# Patient Record
Sex: Male | Born: 1945 | Race: Black or African American | Hispanic: No | Marital: Married | Smoking: Former smoker
Health system: Southern US, Community
[De-identification: ages and names within clinical notes are randomized; demographics above are authoritative.]

## PROBLEM LIST (undated history)

## (undated) DIAGNOSIS — E119 Type 2 diabetes mellitus without complications: Secondary | ICD-10-CM

## (undated) DIAGNOSIS — Z86010 Personal history of colonic polyps: Secondary | ICD-10-CM

## (undated) DIAGNOSIS — D126 Benign neoplasm of colon, unspecified: Secondary | ICD-10-CM

## (undated) DIAGNOSIS — G4700 Insomnia, unspecified: Secondary | ICD-10-CM

## (undated) DIAGNOSIS — K611 Rectal abscess: Secondary | ICD-10-CM

## (undated) DIAGNOSIS — R7303 Prediabetes: Secondary | ICD-10-CM

## (undated) DIAGNOSIS — K649 Unspecified hemorrhoids: Secondary | ICD-10-CM

## (undated) DIAGNOSIS — I629 Nontraumatic intracranial hemorrhage, unspecified: Secondary | ICD-10-CM

## (undated) DIAGNOSIS — I639 Cerebral infarction, unspecified: Secondary | ICD-10-CM

## (undated) DIAGNOSIS — I10 Essential (primary) hypertension: Secondary | ICD-10-CM

## (undated) DIAGNOSIS — F528 Other sexual dysfunction not due to a substance or known physiological condition: Secondary | ICD-10-CM

## (undated) DIAGNOSIS — C679 Malignant neoplasm of bladder, unspecified: Secondary | ICD-10-CM

## (undated) DIAGNOSIS — H269 Unspecified cataract: Secondary | ICD-10-CM

## (undated) DIAGNOSIS — E785 Hyperlipidemia, unspecified: Secondary | ICD-10-CM

## (undated) HISTORY — PX: POLYPECTOMY: SHX149

## (undated) HISTORY — DX: Rectal abscess: K61.1

## (undated) HISTORY — DX: Cerebral infarction, unspecified: I63.9

## (undated) HISTORY — DX: Insomnia, unspecified: G47.00

## (undated) HISTORY — DX: Essential (primary) hypertension: I10

## (undated) HISTORY — DX: Other sexual dysfunction not due to a substance or known physiological condition: F52.8

## (undated) HISTORY — DX: Personal history of colonic polyps: Z86.010

## (undated) HISTORY — DX: Benign neoplasm of colon, unspecified: D12.6

## (undated) HISTORY — PX: CATARACT EXTRACTION W/ INTRAOCULAR LENS  IMPLANT, BILATERAL: SHX1307

## (undated) HISTORY — DX: Hyperlipidemia, unspecified: E78.5

## (undated) HISTORY — DX: Unspecified cataract: H26.9

## (undated) HISTORY — DX: Type 2 diabetes mellitus without complications: E11.9

## (undated) HISTORY — DX: Unspecified hemorrhoids: K64.9

## (undated) HISTORY — PX: COLONOSCOPY: SHX174

## (undated) MED FILL — Dexamethasone Sodium Phosphate Inj 100 MG/10ML: INTRAMUSCULAR | Qty: 1 | Status: AC

## (undated) MED FILL — Fosaprepitant Dimeglumine For IV Infusion 150 MG (Base Eq): INTRAVENOUS | Qty: 5 | Status: AC

---

## 1998-07-08 ENCOUNTER — Ambulatory Visit (HOSPITAL_COMMUNITY): Admission: RE | Admit: 1998-07-08 | Discharge: 1998-07-08 | Payer: Self-pay | Admitting: Internal Medicine

## 2004-06-17 ENCOUNTER — Ambulatory Visit: Payer: Self-pay | Admitting: Internal Medicine

## 2004-06-18 ENCOUNTER — Ambulatory Visit: Payer: Self-pay | Admitting: Internal Medicine

## 2004-07-08 ENCOUNTER — Ambulatory Visit: Payer: Self-pay | Admitting: Internal Medicine

## 2004-07-16 ENCOUNTER — Ambulatory Visit: Payer: Self-pay | Admitting: Internal Medicine

## 2006-03-04 ENCOUNTER — Ambulatory Visit: Payer: Self-pay | Admitting: Internal Medicine

## 2006-03-08 ENCOUNTER — Ambulatory Visit: Payer: Self-pay | Admitting: Internal Medicine

## 2006-03-10 ENCOUNTER — Ambulatory Visit: Payer: Self-pay

## 2006-04-06 ENCOUNTER — Ambulatory Visit: Payer: Self-pay

## 2006-04-06 ENCOUNTER — Encounter: Payer: Self-pay | Admitting: Internal Medicine

## 2006-04-06 HISTORY — PX: TRANSTHORACIC ECHOCARDIOGRAM: SHX275

## 2007-08-22 ENCOUNTER — Telehealth: Payer: Self-pay | Admitting: Internal Medicine

## 2007-11-15 ENCOUNTER — Ambulatory Visit: Payer: Self-pay | Admitting: Internal Medicine

## 2007-11-15 LAB — CONVERTED CEMR LAB
Alkaline Phosphatase: 96 units/L (ref 39–117)
Basophils Absolute: 0 10*3/uL (ref 0.0–0.1)
Bilirubin Urine: NEGATIVE
Bilirubin, Direct: 0.1 mg/dL (ref 0.0–0.3)
Calcium: 9.8 mg/dL (ref 8.4–10.5)
Eosinophils Absolute: 0.2 10*3/uL (ref 0.0–0.7)
GFR calc Af Amer: 79 mL/min
GFR calc non Af Amer: 65 mL/min
HCT: 49 % (ref 39.0–52.0)
HDL: 34.7 mg/dL — ABNORMAL LOW (ref >39.0)
Hemoglobin, Urine: NEGATIVE
Ketones, ur: NEGATIVE mg/dL
MCHC: 33.6 g/dL (ref 30.0–36.0)
MCV: 91.8 fL (ref 78.0–100.0)
Monocytes Absolute: 0.5 10*3/uL (ref 0.1–1.0)
Nitrite: NEGATIVE
PSA: 1.28 ng/mL (ref 0.10–4.00)
Platelets: 264 10*3/uL (ref 150–400)
Potassium: 3.2 meq/L — ABNORMAL LOW (ref 3.5–5.1)
RDW: 13.1 % (ref 11.5–14.6)
Sodium: 142 meq/L (ref 135–145)
TSH: 1.17 microintl units/mL (ref 0.35–5.50)
Triglycerides: 160 mg/dL — ABNORMAL HIGH (ref 0–149)

## 2007-11-16 ENCOUNTER — Encounter: Payer: Self-pay | Admitting: *Deleted

## 2007-11-16 DIAGNOSIS — I10 Essential (primary) hypertension: Secondary | ICD-10-CM | POA: Insufficient documentation

## 2007-11-16 DIAGNOSIS — D126 Benign neoplasm of colon, unspecified: Secondary | ICD-10-CM

## 2007-11-16 DIAGNOSIS — G4700 Insomnia, unspecified: Secondary | ICD-10-CM

## 2007-11-16 DIAGNOSIS — K649 Unspecified hemorrhoids: Secondary | ICD-10-CM | POA: Insufficient documentation

## 2007-11-16 DIAGNOSIS — F528 Other sexual dysfunction not due to a substance or known physiological condition: Secondary | ICD-10-CM | POA: Insufficient documentation

## 2007-11-16 DIAGNOSIS — Z862 Personal history of diseases of the blood and blood-forming organs and certain disorders involving the immune mechanism: Secondary | ICD-10-CM

## 2007-11-16 DIAGNOSIS — J301 Allergic rhinitis due to pollen: Secondary | ICD-10-CM

## 2007-11-16 DIAGNOSIS — Z8639 Personal history of other endocrine, nutritional and metabolic disease: Secondary | ICD-10-CM

## 2007-11-17 ENCOUNTER — Ambulatory Visit: Payer: Self-pay | Admitting: Internal Medicine

## 2007-11-17 DIAGNOSIS — E785 Hyperlipidemia, unspecified: Secondary | ICD-10-CM | POA: Insufficient documentation

## 2007-11-17 DIAGNOSIS — E1169 Type 2 diabetes mellitus with other specified complication: Secondary | ICD-10-CM

## 2007-11-17 DIAGNOSIS — E119 Type 2 diabetes mellitus without complications: Secondary | ICD-10-CM | POA: Insufficient documentation

## 2007-12-07 ENCOUNTER — Ambulatory Visit: Payer: Self-pay | Admitting: Internal Medicine

## 2008-01-11 ENCOUNTER — Encounter: Payer: Self-pay | Admitting: Internal Medicine

## 2008-01-11 ENCOUNTER — Ambulatory Visit: Payer: Self-pay | Admitting: Internal Medicine

## 2008-01-15 ENCOUNTER — Ambulatory Visit: Payer: Self-pay | Admitting: Internal Medicine

## 2008-01-15 ENCOUNTER — Encounter: Payer: Self-pay | Admitting: Internal Medicine

## 2008-01-18 ENCOUNTER — Ambulatory Visit: Payer: Self-pay | Admitting: Internal Medicine

## 2008-01-21 ENCOUNTER — Encounter: Payer: Self-pay | Admitting: Internal Medicine

## 2008-01-26 ENCOUNTER — Ambulatory Visit: Payer: Self-pay | Admitting: Internal Medicine

## 2008-01-26 ENCOUNTER — Encounter: Payer: Self-pay | Admitting: Internal Medicine

## 2008-06-13 ENCOUNTER — Telehealth: Payer: Self-pay | Admitting: Internal Medicine

## 2009-06-18 ENCOUNTER — Encounter (INDEPENDENT_AMBULATORY_CARE_PROVIDER_SITE_OTHER): Payer: Self-pay | Admitting: *Deleted

## 2009-06-23 ENCOUNTER — Ambulatory Visit: Payer: Self-pay | Admitting: Internal Medicine

## 2009-06-23 LAB — CONVERTED CEMR LAB
ALT: 27 units/L (ref 0–53)
Albumin: 3.8 g/dL (ref 3.5–5.2)
Alkaline Phosphatase: 95 units/L (ref 39–117)
Basophils Relative: 0.2 % (ref 0.0–3.0)
Bilirubin Urine: NEGATIVE
Bilirubin, Direct: 0.1 mg/dL (ref 0.0–0.3)
CO2: 29 meq/L (ref 19–32)
Calcium: 9.5 mg/dL (ref 8.4–10.5)
Chloride: 105 meq/L (ref 96–112)
Direct LDL: 160 mg/dL
Eosinophils Absolute: 0.2 10*3/uL (ref 0.0–0.7)
Eosinophils Relative: 2.4 % (ref 0.0–5.0)
Hemoglobin: 16 g/dL (ref 13.0–17.0)
Leukocytes, UA: NEGATIVE
Lymphocytes Relative: 25.5 % (ref 12.0–46.0)
MCHC: 33.9 g/dL (ref 30.0–36.0)
MCV: 93 fL (ref 78.0–100.0)
Neutro Abs: 5.5 10*3/uL (ref 1.4–7.7)
Nitrite: NEGATIVE
PSA: 1.19 ng/mL (ref 0.10–4.00)
RBC: 5.07 M/uL (ref 4.22–5.81)
Sodium: 143 meq/L (ref 135–145)
Specific Gravity, Urine: 1.01 (ref 1.000–1.030)
Total CHOL/HDL Ratio: 5
Total Protein, Urine: NEGATIVE mg/dL
Total Protein: 7 g/dL (ref 6.0–8.3)
Triglycerides: 132 mg/dL (ref 0.0–149.0)
WBC: 8.3 10*3/uL (ref 4.5–10.5)
pH: 6 (ref 5.0–8.0)

## 2009-06-24 ENCOUNTER — Ambulatory Visit: Payer: Self-pay | Admitting: Internal Medicine

## 2009-06-24 DIAGNOSIS — H259 Unspecified age-related cataract: Secondary | ICD-10-CM | POA: Insufficient documentation

## 2009-09-10 ENCOUNTER — Encounter: Payer: Self-pay | Admitting: Internal Medicine

## 2009-09-11 ENCOUNTER — Ambulatory Visit: Payer: Self-pay | Admitting: Internal Medicine

## 2009-09-11 DIAGNOSIS — R259 Unspecified abnormal involuntary movements: Secondary | ICD-10-CM | POA: Insufficient documentation

## 2009-09-11 DIAGNOSIS — F411 Generalized anxiety disorder: Secondary | ICD-10-CM | POA: Insufficient documentation

## 2009-09-17 ENCOUNTER — Ambulatory Visit: Payer: Self-pay | Admitting: Internal Medicine

## 2009-09-22 ENCOUNTER — Ambulatory Visit: Payer: Self-pay | Admitting: Internal Medicine

## 2009-10-13 ENCOUNTER — Telehealth: Payer: Self-pay | Admitting: Internal Medicine

## 2009-12-11 ENCOUNTER — Telehealth: Payer: Self-pay | Admitting: Internal Medicine

## 2010-02-10 ENCOUNTER — Telehealth: Payer: Self-pay | Admitting: Internal Medicine

## 2010-02-16 ENCOUNTER — Encounter (INDEPENDENT_AMBULATORY_CARE_PROVIDER_SITE_OTHER): Payer: Self-pay | Admitting: *Deleted

## 2010-03-04 ENCOUNTER — Telehealth: Payer: Self-pay | Admitting: Internal Medicine

## 2010-03-19 ENCOUNTER — Encounter (INDEPENDENT_AMBULATORY_CARE_PROVIDER_SITE_OTHER): Payer: Self-pay | Admitting: *Deleted

## 2010-03-20 ENCOUNTER — Ambulatory Visit: Payer: Self-pay | Admitting: Internal Medicine

## 2010-04-03 ENCOUNTER — Ambulatory Visit: Payer: Self-pay | Admitting: Internal Medicine

## 2010-04-10 DIAGNOSIS — Z860100 Personal history of colon polyps, unspecified: Secondary | ICD-10-CM

## 2010-04-10 HISTORY — DX: Personal history of colon polyps, unspecified: Z86.0100

## 2010-04-10 HISTORY — DX: Personal history of colonic polyps: Z86.010

## 2010-04-16 ENCOUNTER — Encounter: Payer: Self-pay | Admitting: Internal Medicine

## 2010-07-28 NOTE — Progress Notes (Signed)
  Phone Note Refill Request Message from:  Fax from Pharmacy on December 11, 2009 4:50 PM  Refills Requested: Medication #1:  CATAPRES-TTS-3 0.3 MG/24HR PTWK apply once a week Please Advise refill  Initial call taken by: Ami Bullins CMA,  December 11, 2009 4:50 PM  Follow-up for Phone Call        ok to refill prn Follow-up by: Jacques Navy MD,  December 12, 2009 9:12 AM    Prescriptions: CATAPRES-TTS-3 0.3 MG/24HR PTWK (CLONIDINE HCL) apply once a week  #4 x 1   Entered by:   Ami Bullins CMA   Authorized by:   Jacques Navy MD   Signed by:   Bill Salinas CMA on 12/12/2009   Method used:   [facility] [address] [phone]* (retail)       [address]       [place], [place]  [address]       Ph: [phone] or [phone]       Fax: [phone]   RxID:   [id number]

## 2010-07-28 NOTE — Letter (Signed)
Summary: Patient Notice- Polyp Results CORRECTED  [facility]  [address]   [place], [place] [address]   Phone: [phone]  Fax: [phone]        April 16, 2010 MRN: [id number]    Drew Harrington [address] [place], [place]  [address]    Dear [name],   The polyps removed from your colon were adenomatous. This means that they were pre-cancerous or that  they had the potential to change into cancer over time.   I recommend that you have a repeat colonoscopy in 3 years to determine if you have developed any new polyps over time. If you develop any new rectal bleeding, abdominal pain or significant bowel habit changes, please contact us before then.   In addition to repeating colonoscopy, changing health habits may reduce your risk of having more colon polyps and possibly, colon cancer. You may lower your risk of future polyps and colon cancer by adopting healthy habits such as not smoking or using tobacco (if you do), being physically active, losing weight (if overweight), and eating a diet which includes fruits and vegetables and limits red meat.  Please call us if you are having persistent problems or have questions about your condition that have not been fully answered at this time.   Sincerely,  Iva Boop MD, [facility]  This letter has been electronically signed by your physician.  Appended Document: Patient Notice- Polyp Results CORRECTED letter mailed

## 2010-07-28 NOTE — Progress Notes (Signed)
Summary: Schedule Colonoscopy   Phone Note Outgoing Call Call back at [facility] Phone [phone]   Call placed by: Harlow Mares CMA Duncan Dull),  February 10, 2010 9:22 AM Call placed to: Patient Summary of Call: Left message on patients machine to call back. patient is due for his colonoscopy Initial call taken by: Harlow Mares CMA Duncan Dull),  February 10, 2010 9:28 AM  Follow-up for Phone Call        Left a message on the patient machine to call back and schedule a previsit and procedure with our office. A letter will be mailed to the patient.   Follow-up by: Harlow Mares CMA Duncan Dull),  February 16, 2010 9:16 AM     Appended Document: Schedule Colonoscopy colon 04/03/2010

## 2010-07-28 NOTE — Progress Notes (Signed)
  Phone Note Refill Request Message from:  Fax from Pharmacy on October 13, 2009 9:46 AM  Refills Requested: Medication #1:  CATAPRES-TTS-3 0.3 MG/24HR PTWK apply once a week   Last Refilled: 08/2009 Please Advise refill  Initial call taken by: Ami Bullins CMA,  October 13, 2009 9:47 AM  Follow-up for Phone Call        ok to refill, #4, apply patch weekly, as needed refills.  Is his BP better?> spoke with pt wife and she states pt's BP reading much improved. [facility] [address] Follow-up by: Jacques Navy MD,  October 13, 2009 5:22 PM    Prescriptions: CATAPRES-TTS-3 0.3 MG/24HR PTWK (CLONIDINE HCL) apply once a week  #4 x 1   Entered by:   Ami Bullins CMA   Authorized by:   Jacques Navy MD   Signed by:   Bill Salinas CMA on 10/14/2009   Method used:   [facility]        [facility] [address] [phone]* (retail)       [address]       [place], [place]  [address]       Ph: [phone] or [phone]       Fax: [phone]   RxID:   [id number]

## 2010-07-28 NOTE — Miscellaneous (Signed)
Summary: [facility] PREVISIT/PREP  Clinical Lists Changes  Medications: Added new medication of MOVIPREP 100 GM  SOLR (PEG-KCL-NACL-NASULF-NA ASC-C) As per prep instructions. - Signed Rx of MOVIPREP 100 GM  SOLR (PEG-KCL-NACL-NASULF-NA ASC-C) As per prep instructions.;  #1 x 0;  Signed;  Entered by: Wyona Almas RN;  Authorized by: Iva Boop MD, Clementeen Graham;  Method used: Electronically to [facility]*, [address], [address] Shipping/mailing, [place], [place]  [address], Ph: [phone], Fax: [phone] Observations: Added new observation of ALLERGY REV: Done (03/20/2010 10:37)    Prescriptions: MOVIPREP 100 GM  SOLR (PEG-KCL-NACL-NASULF-NA ASC-C) As per prep instructions.  #1 x 0   Entered by:   Wyona Almas RN   Authorized by:   Iva Boop MD, [facility]   Signed by:   Wyona Almas RN on 03/20/2010   Method used:   Electronically to        [facility]* (retail)       [address]       [address] Shipping[address]mail[address]       [place], [place]  [address]       Ph: [phone]       Fax: [phone]   RxID:   [id number]

## 2010-07-28 NOTE — Miscellaneous (Signed)
Summary: Doctor, general practice Healthcare   Imported By: Lester [place] 09/17/2009 10:48:32  _____________________________________________________________________  External Attachment:    Type:   Image     Comment:   External Document

## 2010-07-28 NOTE — Letter (Signed)
Summary: Previsit letter  [facility]  [address] [place], [place] [address]   Phone: [phone]  Fax: [phone]       02/16/2010 MRN: [id number]  Drew Harrington [address] [place], [place]  [address]  Dear [name],  Welcome to the Gastroenterology Division at [facility].    You are scheduled to see a nurse for your pre-procedure visit on March 20, 2010 at 10:30am on the 3rd floor at [facility], [address]  We ask that you try to arrive at our office 15 minutes prior to your appointment time to allow for check-in.  Your nurse visit will consist of discussing your medical and surgical history, your immediate family medical history, and your medications.    Please bring a complete list of all your medications or, if you prefer, bring the medication bottles and we will list them.  We will need to be aware of both prescribed and over the counter drugs.  We will need to know exact dosage information as well.  If you are on blood thinners (Coumadin, Plavix, Aggrenox, Ticlid, etc.) please call our office today/prior to your appointment, as we need to consult with your physician about holding your medication.   Please be prepared to read and sign documents such as consent forms, a financial agreement, and acknowledgement forms.  If necessary, and with your consent, a friend or relative is welcome to sit-in on the nurse visit with you.  Please bring your insurance card so that we may make a copy of it.  If your insurance requires a referral to see a specialist, please bring your referral form from your primary care physician.  No co-pay is required for this nurse visit.     If you cannot keep your appointment, please call [phone] to cancel or reschedule prior to your appointment date.  This allows [place] the opportunity to schedule an appointment for another patient in need of care.    Thank you for choosing [facility] for  your medical needs.  We appreciate the opportunity to care for you.  Please visit [place] at our website  to learn more about our practice.                     Sincerely.                                                                                                                   The Gastroenterology Division

## 2010-07-28 NOTE — Assessment & Plan Note (Signed)
Summary: tremors in hands-lb   Vital Signs:  Patient profile:   65 year old male Height:      68 inches Weight:      193 pounds BMI:     29.45 O2 Sat:      98 % on Room air Temp:     97.2 degrees F oral Pulse rate:   71 / minute BP sitting:   160 / 126  (left arm) Cuff size:   regular  Vitals Entered By: Bill Salinas CMA (September 11, 2009 1:06 PM)  O2 Flow:  Room air CC: pt here with complaint of tremers in his hands x 2 years/ ab   Primary Care Provider:  Norins  CC:  pt here with complaint of tremers in his hands x 2 years/ ab.  History of Present Illness: patient presents for evaluation of tremulous handwriting. He denies any other symptoms: no resting tremor, lack of coordination, difficutly with fine motor activity. He has no gait trouble.   he does mention several times that he is anxious. He attributes this to his cataracts and visual change. He is scheduled for cataract surgery in the near future.   BP is very high today. He reports that his BP at home is much better but he cannot give me values.   Current Medications (verified): 1)  Furosemide 40 Mg  Tabs (Furosemide) .Marland Kitchen.. 1 By Mouth Once Daily 2)  Metoprolol Tartrate 50 Mg  Tabs (Metoprolol Tartrate) .Marland Kitchen.. 1 By Mouth Two Times A Day 3)  Catapres-Tts-2 0.2 Mg/24hr  Ptwk (Clonidine Hcl) .... Apply Patch Once A Week. 4)  Diovan 320 Mg  Tabs (Valsartan) .Marland Kitchen.. 1 By Mouth Once Daily  Allergies (verified): 1)  Viagra (Sildenafil Citrate) PMH-FH-SH reviewed-no changes except otherwise noted  Review of Systems Neuro:  Complains of disturbances in coordination and tremors; denies brief paralysis, difficulty with concentration, falling down, inability to speak, memory loss, numbness, poor balance, and visual disturbances.  Physical Exam  General:  Well-developed,well-nourished,in no acute distress; alert,appropriate and cooperative throughout examination Head:  normocephalic and atraumatic.   Lungs:  Normal respiratory  effort, chest expands symmetrically. Lungs are clear to auscultation, no crackles or wheezes. Heart:  normal rate and regular rhythm.   Neurologic:  alert & oriented X3, cranial nerves II-XII intact, strength normal in all extremities, and gait normal.  Normal turn. Mild cogwheel rigidity at elbows. Tremulous handwriting.    Impression & Recommendations:  Problem # 1:  TREMOR (ICD-781.0) Patient with slight tremor with handwriting. ON exam he does have mild cogwheel rigidity but normal gail with fluidity of movement. No definitive evidence of Parkison's like tremor.  Plan - watchfull waiting.  Problem # 2:  ANXIETY STATE, UNSPECIFIED (ICD-300.00)  Patient does report GAD which he attributes to a variety of factors.  Plan - trial of low dose sertraline for relief of symptoms.  His updated medication list for this problem includes:    Sertraline Hcl 25 Mg Tabs (Sertraline hcl) .Marland Kitchen... 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  Complete Medication List: 1)  Furosemide 40 Mg Tabs (Furosemide) .Marland Kitchen.. 1 by mouth once daily 2)  Metoprolol Tartrate 50 Mg Tabs (Metoprolol tartrate) .Marland Kitchen.. 1 by mouth two times a day 3)  Catapres-tts-2 0.2 Mg/24hr Ptwk (Clonidine hcl) .... Apply patch once a week. 4)  Diovan 320 Mg Tabs (Valsartan) .Marland Kitchen.. 1 by mouth once  daily 5)  Sertraline Hcl 25 Mg Tabs (Sertraline hcl) .Marland Kitchen.. 1 by mouth once daily for anxiety  Patient Instructions: 1)  Shaky handwriting - exam is normal except for mild cogwheel rigidity. As an isolated symptom this is not diagnostic. Plan - careful observation: watch for increased problem with handwriting or any tremor. 2)  Blood pressure - very high today. Please report back to me several readings from home. We can adjust your medication if not well controlled.  3)  Anxiety - may be caused by any number  of things including visula problems with cataracts. Plan - sertraline 25mg  once a day for nerves.  Prescriptions: SERTRALINE HCL 25 MG TABS (SERTRALINE HCL) 1 by mouth once daily for anxiety  #30 x 12   Entered and Authorized by:   Jacques Navy MD   Signed by:   Jacques Navy MD on 09/11/2009   Method used:   Handwritten   RxID:   [id number]

## 2010-07-28 NOTE — Progress Notes (Signed)
  Phone Note Refill Request   Refills Requested: Medication #1:  CATAPRES-TTS-3 0.3 MG/24HR PTWK apply once a week please Advise refill  Initial call taken by: Ami Bullins CMA,  March 04, 2010 11:15 AM  Follow-up for Phone Call        ok for refills prn Follow-up by: Jacques Navy MD,  March 04, 2010 3:40 PM    Prescriptions: CATAPRES-TTS-3 0.3 MG/24HR PTWK (CLONIDINE HCL) apply once a week  #4 x 0   Entered by:   Ami Bullins CMA   Authorized by:   Jacques Navy MD   Signed by:   Bill Salinas CMA on 03/05/2010   Method used:   [facility]        [facility] [address] [phone]* (retail)       [address]       [place], [place]  [address]       Ph: [phone] or [phone]       Fax: [phone]   RxID:   [id number]

## 2010-07-28 NOTE — Letter (Signed)
Summary: [facility] Instructions  [facility]  [address] [place], [place] [address]   Phone: [phone]  Fax: [phone]       Drew Harrington    10/11/1945    MRN: [id number]        Procedure Day Dorna Bloom:  [name]  04/03/10     Arrival Time:  10:00AM     Procedure Time:  11:00AM     Location of Procedure:                    _ X_  [facility] (4th Floor)                      PREPARATION FOR COLONOSCOPY WITH MOVIPREP   Starting 5 days prior to your procedure 03/29/10 do not eat nuts, seeds, popcorn, corn, beans, peas,  salads, or any raw vegetables.  Do not take any fiber supplements (e.g. Metamucil, Citrucel, and Benefiber).  THE DAY BEFORE YOUR PROCEDURE         DATE: 04/02/10  DAY: THURSDAY  1.  Drink clear liquids the entire day-NO SOLID FOOD  2.  Do not drink anything colored red or purple.  Avoid juices with pulp.  No orange juice.  3.  Drink at least 64 oz. (8 glasses) of fluid/clear liquids during the day to prevent dehydration and help the prep work efficiently.  CLEAR LIQUIDS INCLUDE: Water Jello Ice Popsicles Tea (sugar ok, no milk/cream) Powdered fruit flavored drinks Coffee (sugar ok, no milk/cream) Gatorade Juice: apple, white grape, white cranberry  Lemonade Clear bullion, consomm, broth Carbonated beverages (any kind) Strained chicken noodle soup Hard Candy                             4.  In the morning, mix first dose of MoviPrep solution:    Empty 1 Pouch A and 1 Pouch B into the disposable container    Add lukewarm drinking water to the top line of the container. Mix to dissolve    Refrigerate (mixed solution should be used within 24 hrs)  5.  Begin drinking the prep at 5:00 p.m. The MoviPrep container is divided by 4 marks.   Every 15 minutes drink the solution down to the next mark (approximately 8 oz) until the full liter is complete.   6.  Follow completed prep with 16 oz of clear liquid of your choice  (Nothing red or purple).  Continue to drink clear liquids until bedtime.  7.  Before going to bed, mix second dose of MoviPrep solution:    Empty 1 Pouch A and 1 Pouch B into the disposable container    Add lukewarm drinking water to the top line of the container. Mix to dissolve    Refrigerate  THE DAY OF YOUR PROCEDURE      DATE: 04/03/10   DAY: FRIDAY  Beginning at 6:00AM (5 hours before procedure):         1. Every 15 minutes, drink the solution down to the next mark (approx 8 oz) until the full liter is complete.  2. Follow completed prep with 16 oz. of clear liquid of your choice.    3. You may drink clear liquids until 9:00AM (2 HOURS BEFORE PROCEDURE).   MEDICATION INSTRUCTIONS  Unless otherwise instructed, you should take regular prescription medications with a small sip of water   as early as possible the morning of  your procedure.   Additional medication instructions: Do not take any Furosemide the morning of procedure.         OTHER INSTRUCTIONS  You will need a responsible adult at least 66 years of age to accompany you and drive you home.   This person must remain in the waiting room during your procedure.  Wear loose fitting clothing that is easily removed.  Leave jewelry and other valuables at home.  However, you may wish to bring a book to read or  an iPod/MP3 player to listen to music as you wait for your procedure to start.  Remove all body piercing jewelry and leave at home.  Total time from sign-in until discharge is approximately 2-3 hours.  You should go home directly after your procedure and rest.  You can resume normal activities the  day after your procedure.  The day of your procedure you should not:   Drive   Make legal decisions   Operate machinery   Drink alcohol   Return to work  You will receive specific instructions about eating, activities and medications before you leave.    The above instructions have been reviewed  and explained to me by   Wyona Almas RN  March 20, 2010 11:01 AM     I fully understand and can verbalize these instructions _____________________________ Date _________

## 2010-07-28 NOTE — Assessment & Plan Note (Signed)
Summary: BP IS HIGH/NWS   Vital Signs:  Patient profile:   65 year old male Height:      68 inches Weight:      193 pounds BMI:     29.45 O2 Sat:      98 % on Room air Temp:     98.4 degrees F oral Pulse rate:   70 / minute BP sitting:   168 / 100  (right arm) Cuff size:   regular  Vitals Entered By: Bill Salinas CMA (September 17, 2009 1:48 PM)  O2 Flow:  Room air CC: office visit for evaluation of elevated bp/ ab   Primary Care Provider:  Arlenis Blaydes  CC:  office visit for evaluation of elevated bp/ ab.  History of Present Illness: Seen acutely for high BP at day surgery for cataract surgery two days ago: BP 220/140. He has been asymptomatic. Home readings in th e160/100 range.  Current Medications (verified): 1)  Furosemide 40 Mg  Tabs (Furosemide) .Marland Kitchen.. 1 By Mouth Once Daily 2)  Metoprolol Tartrate 50 Mg  Tabs (Metoprolol Tartrate) .Marland Kitchen.. 1 By Mouth Two Times A Day 3)  Catapres-Tts-2 0.2 Mg/24hr  Ptwk (Clonidine Hcl) .... Apply Patch Once A Week. 4)  Diovan 320 Mg  Tabs (Valsartan) .Marland Kitchen.. 1 By Mouth Once Daily 5)  Sertraline Hcl 25 Mg Tabs (Sertraline Hcl) .Marland Kitchen.. 1 By Mouth Once Daily For Anxiety  Allergies (verified): 1)  Viagra (Sildenafil Citrate) PMH-FH-SH reviewed-no changes except otherwise noted  Review of Systems  The patient denies anorexia, fever, weight loss, weight gain, decreased hearing, chest pain, dyspnea on exertion, peripheral edema, abdominal pain, severe indigestion/heartburn, muscle weakness, transient blindness, and difficulty walking.         no visual changes, epistaxis, severe headache  Physical Exam  General:  Well-developed,well-nourished,in no acute distress; alert,appropriate and cooperative throughout examination Head:  normocephalic and atraumatic.   Eyes:  nl fundi Lungs:  normal respiratory effort and normal breath sounds.   Heart:  normal rate and no murmur.   Neurologic:  alert & oriented X3, cranial nerves II-XII intact, and gait normal.      Impression & Recommendations:  Problem # 1:  HYPERTENSION (ICD-401.9) Poor control  Plan  increase metoprolol to 100mg  AM 50mg  PM          increase TTS patch to 0.3mg           continue all other meds.   His updated medication list for this problem includes:    Furosemide 40 Mg Tabs (Furosemide) .Marland Kitchen... 1 by mouth once daily    Metoprolol Tartrate 50 Mg Tabs (Metoprolol tartrate) .Marland Kitchen... 2 tabs in the am and 1 tab pm    Catapres-tts-3 0.3 Mg/24hr Ptwk (Clonidine hcl) .Marland Kitchen... Apply once a week    Diovan 320 Mg Tabs (Valsartan) .Marland Kitchen... 1 by mouth once daily  Complete Medication List: 1)  Furosemide 40 Mg Tabs (Furosemide) .Marland Kitchen.. 1 by mouth once daily 2)  Metoprolol Tartrate 50 Mg Tabs (Metoprolol tartrate) .... 2 tabs in the am and 1 tab pm 3)  Catapres-tts-3 0.3 Mg/24hr Ptwk (Clonidine hcl) .... Apply once a week 4)  Diovan 320 Mg Tabs (Valsartan) .Marland Kitchen.. 1 by mouth once daily 5)  Sertraline Hcl 25 Mg Tabs (Sertraline hcl) .Marland Kitchen.. 1 by mouth once daily for anxiety  Patient Instructions: 1)  Blood pressure - has been too high. Plan - continue diovan and furosemide. Increase TTS patch to 0.3mg /24 hr; increase metoprolol  50mg  to 2 tabs in AM, 1  tab in PM. Return Monday PM for BP check.  2)  Anxiety - increase sertraline to 50mg  (2x 25mg ) daily. Prescriptions: CATAPRES-TTS-3 0.3 MG/24HR PTWK (CLONIDINE HCL) apply once a week  #1 x 0   Entered and Authorized by:   Jacques Navy MD   Signed by:   Lucious Groves on 09/17/2009   Method used:   [facility]        [facility] [address] [phone]* (retail)       [address]       [place], [place]  [address]       Ph: [phone] or [phone]       Fax: [phone]   RxID:   [id number]

## 2010-07-28 NOTE — Procedures (Signed)
Summary: Colonoscopy  Patient: Drew Harrington Note: All result statuses are Final unless otherwise noted.  Tests: (1) Colonoscopy (COL)   COL Colonoscopy           DONE     [facility]     [address]     [place], [place]  [address]           COLONOSCOPY PROCEDURE REPORT           PATIENT:  Drew Harrington, Drew Harrington  MR#:  [id number]     BIRTHDATE:  [birth year], 64 yrs. old  GENDER:  male     ENDOSCOPIST:  Iva Boop, MD, [facility]           PROCEDURE DATE:  04/03/2010     PROCEDURE:  Colonoscopy with snare polypectomy     ASA CLASS:  Class II     INDICATIONS:  surveillance and high-risk screening, history of     pre-cancerous (adenomatous) colon polyps diminutive adenoma     removed in 2006     MEDICATIONS:   Fentanyl 75 mcg, Versed 7 mg IV           DESCRIPTION OF PROCEDURE:   After the risks benefits and     alternatives of the procedure were thoroughly explained, informed     consent was obtained.  Digital rectal exam was performed and     revealed no abnormalities and normal prostate.   The LB [id number]     [id number] endoscope was introduced through the anus and advanced to     the cecum, which was identified by both the appendix and ileocecal     valve, without limitations though the colon was redundant and RLQ     pressure was applied.  The quality of the prep was good, using     MoviPrep.  The instrument was then slowly withdrawn as the colon     was fully examined. Insertion: 6:47 minutes and Withdrawal: 12:46     minutes     <<PROCEDUREIMAGES>>           FINDINGS:  Two polyps were found in the right colon. Both about 1     cm and one in cecum and  one at hepatic flexure. Polyps were     snared, then cauterized with monopolar cautery. Retrieval was     successful. This was otherwise a normal examination of the colon.     Retroflexed views in the rectum revealed internal and external     hemorrhoids.    The scope was then withdrawn from the patient and     the  procedure completed.           COMPLICATIONS:  None     ENDOSCOPIC IMPRESSION:     1) Two 1cm polyps removed from the right colon     2) Internal and external hemorrhoids     3) Otherwise normal examination, good prep     4) Prior removal of diminutive adenoma in 2006     RECOMMENDATIONS:     1) No aspirin or NSAID's for 2 weeks           REPEAT EXAM:  In for Colonoscopy, pending biopsy results.           Iva Boop, MD, [facility]           CC:  The Patient           n.     eSIGNED:   Maryjean Morn.  Gessner at 04/03/2010 11:57 AM           Malachi Bonds, [id number]  Note: An exclamation mark (!) indicates a result that was not dispersed into the flowsheet. Document Creation Date: 04/03/2010 11:58 AM _______________________________________________________________________  (1) Order result status: Final Collection or observation date-time: 04/03/2010 11:47 Requested date-time:  Receipt date-time:  Reported date-time:  Referring Physician:   Ordering Physician: Stan Head [phone]) Specimen Source:  Source: Launa Grill Order Number: [id number] Lab site:   Appended Document: Colonoscopy   Colonoscopy  Procedure date:  04/03/2010  Findings:       1) Two 1cm polyps removed from the right colon TUBULAR ADENOMAS     2) Internal and external hemorrhoids     3) Otherwise normal examination, good prep     4) Prior removal of diminutive adenoma in 2006  Comments:      Repeat colonoscopy in 3 years.     Procedures Next Due Date:    Colonoscopy: 03/2013   Appended Document: Colonoscopy     Procedures Next Due Date:    Colonoscopy: 03/2013

## 2010-11-13 NOTE — Assessment & Plan Note (Signed)
[facility]                                 ON-CALL NOTE   NAME:Drew Harrington, Drew Harrington                      MRN:          [id number]  DATE:11/18/2007                            DOB:          12/31/1945    At 2:06 p.m.  Phone number is [phone].   CALLER:  Clotilde Dieter, the wife.   OBJECTIVE:  The patient was seen yesterday, was given 3 prescriptions  for blood pressure medicines by Dr. Debby Bud.  I thought they have been  sent in to [facility] of [address]  It turns out after looking it up on  the computer, the patient's scripts were sent in electronically to  [facility] on [address].  Wife was made aware and they will go and  pick them up.  Primary care Nida Manfredi is Dr. Debby Bud, home office is [place].     Arta Silence, MD  Electronically Signed    RNS/MedQ  DD: 11/18/2007  DT: 11/19/2007  Job #: [id number]

## 2010-11-13 NOTE — Assessment & Plan Note (Signed)
[facility]                             PRIMARY CARE OFFICE NOTE   NAME:Drew Harrington, Drew Harrington                    MRN:          [id number]  DATE:03/07/2006                            DOB:          1946/04/08    Drew Harrington is a very pleasant 65 year old African American gentleman who  presents for routine followup evaluation and exam.  He was last seen  June 18, 2004.  The patient has been followed for hypertension.   INTERVAL HISTORY:  The patient reports he is feeling well and doing well.  He did report 1 episode where after running 5 sets of stairs, he had  transient left upper chest pain which he describes as not sharp, but kind of  dull heavy pressure which lasted approximately 2 minutes and then he made a  good recovery.  The patient has had no recurrent symptoms.  Of note, he has  not tried to run 5 sets of stairs again.   PAST MEDICAL HISTORY:   SURGICAL:  None.   INJURIES:  None.   MEDICAL:  1. Usual childhood diseases.  2. Seasonal allergic rhinitis.  3. Hypertension.   FAMILY HISTORY:  Father died at age 44 of pulmonary problems.  Mother died  at age 42 of intestinal blockage.  The patient has a family history of  cardiovascular disease, hypertension and gout.   SOCIAL HISTORY:  The patient is married x29 years, almost 30 years.  He has  2 sons.  The patient works as an International aid/development worker at [employer] and also has a  Engineer, drilling business on the side.   CURRENT MEDICATIONS:  1. Hydrochlorothiazide 25 mg daily.  2. Propranolol 10 mg q.4 h. p.r.n. palpitations and anxiety.   REVIEW OF SYSTEMS:  Negative for any constitutional symptoms.  He does get 4-  5 hours of sleep a night.  He has had an eye exam in the last 24 months.  No  dental problems, given he is edentulous.  CARDIOVASCULAR:  Review with chest  discomfort after stair-climbing, as noted.  No respiratory or GI problems.  GU:  Nocturia x1 and mild __________ .  No  musculoskeletal or neurologic  complaints or problems.   CHART REVIEW:  The patient did undergo colonoscopy, July 16, 2004, which  was a negative study, except for internal and external hemorrhoids.  The  patient did have adenomatous polyps and would be a candidate for followup in  3 years.   PHYSICAL EXAM:  VITAL SIGNS:  Temperature was 98, blood pressure was 159/99,  pulse 78.  Weight 178.  GENERAL APPEARANCE:  A well-nourished, athletic-appearing gentleman looking  younger than his stated chronologic age in no acute distress.  HEENT:  Normocephalic, atraumatic.  EACs and TMs were normal.  Oropharynx  with full dentures in place.  No buccal lesions were noted.  Posterior  pharynx was clear.  Conjunctivae and sclerae were clear.  PERRLA.  EOMI.  Funduscopic exam was unremarkable.  NECK:  Supple without thyromegaly.  NODES:  No adenopathy was noted in the cervical, supraclavicular, axillary  or inguinal regions.  CHEST:  No CVA tenderness.  LUNGS:  Clear to auscultation and percussion.  CARDIOVASCULAR:  Radial pulse 2+.  No JVD.  No carotid bruits.  He had a  quiet precordium, a regular rate and rhythm.  No murmurs, rubs, or gallops  were appreciated.  ABDOMEN:  Soft.  No guarding or rebound.  No organosplenomegaly was noted.  GENITALIA:  Normal.  RECTAL:  Exam revealed normal sphincter tone.  Prostate was smooth and of a  normal size and contour without nodules.  EXTREMITIES:  Without clubbing, cyanosis, edema or deformity.  NEUROLOGIC:  Exam was nonfocal.  SKIN:  Clear.   DATA BASE:  Hemoglobin was 16.5 g; white count was 9300 with a normal  differential.  Cholesterol was 187, triglycerides 78, HDL was 37.2, and LDL  was 134.  Chemistries were normal with a blood sugar of 87.  Electrolytes  were normal.  Kidney function normal with a GFR of 72 mL/min.  Liver  functions were normal.  TSH was normal at 3.48.  PSA was normal at 1.13.  Urinalysis was negative.   Twelve-lead  electrocardiogram revealed a sinus rhythm.  The patient had J-  point elevation in V1, V2, V3.  He had T wave inversion in V5 and V6; this  is consistent with lateral ischemia.   ASSESSMENT AND PLAN:  1. Hypertension:  The patient is suboptimally controlled.  I discussed      options with him.  Plan:  Diovan HCT 160/12.5 mg to take once daily, 28-      day supply provided.  The patient is to return in 1 week for blood      pressure followup.  The patient will also need to have followup BUN and      creatinine, being put on an angiotensin-related drug.  2. Cardiovascular:  Patient with a single episode of exertional-related      chest pain.  He does have an abnormal EKG with lateral ischemia by      virtue of inverted T waves.  Given his age and his hypertension, I      would be concerned for cardiovascular disease.  Plan:  The patient is      to have a stress-only Cardiolite study on Thursday, March 10, 2006,      at 9:30 a.m.; the patient is aware of this appointment.  The patient is      instructed as well to have a light breakfast and wear exercise      clothing.  He will return in 1 week to review his study.  3. Health maintenance:  The patient's LDL cholesterol is minimally      elevated at 134.  Given his cardiac risk factors, we will need to      consider trying to get his LDL cholesterol down.  We will discuss this      with him when he returns after his stress study.  The patient is up to      date with colorectal cancer screening with colonoscopy, as noted.  He      would be a candidate for followup in 2009.   SUMMARY:  This is a very pleasant gentleman who is being evaluated at this  point for heart disease.  He will return in 1 week to review his Cardiolite  study and to monitor his blood pressure.  Rosalyn Gess Norins, MD   MEN/MedQ  DD:  03/08/2006  DT:  03/09/2006 Job #:  [id number]   cc:   [address] [place], [place] [address]  Malachi Bonds  [facility]

## 2010-12-07 ENCOUNTER — Ambulatory Visit (INDEPENDENT_AMBULATORY_CARE_PROVIDER_SITE_OTHER): Payer: 59 | Admitting: Internal Medicine

## 2010-12-07 VITALS — BP 150/90 | HR 69 | Temp 98.4°F | Wt 192.0 lb

## 2010-12-07 DIAGNOSIS — I10 Essential (primary) hypertension: Secondary | ICD-10-CM

## 2010-12-07 DIAGNOSIS — M542 Cervicalgia: Secondary | ICD-10-CM

## 2010-12-07 MED ORDER — CLONIDINE HCL 0.3 MG/24HR TD PTWK: 1.0000 | MEDICATED_PATCH | TRANSDERMAL | Status: DC

## 2010-12-07 MED ORDER — AMLODIPINE BESYLATE-VALSARTAN 10-320 MG PO TABS: 1.0000 | ORAL_TABLET | Freq: Every day | ORAL | Status: DC

## 2010-12-07 NOTE — Progress Notes (Signed)
  Subjective:    Patient ID: Drew Harrington, male    DOB: 01-01-1946, 65 y.o.   MRN: [id number]  HPI Mr. Foglio presents for surgical clearance and insurance help. He has a long term posterior cervical scar that is old but has been giving him a lot of discomfort. He has consulted with Dr. Shon Hough who feels that the scar has developed subcutaneous thickening that is the cause of the pain and that the scar can be successfully revised to relieve the pain.   He has been seeing Dr. Shana Chute for blood pressure control. He is currently doing well with a catapress patch and Exforge.   Past Medical History  Diagnosis Date  . Unspecified hemorrhoids without mention of complication   . Benign neoplasm of colon   . Psychosexual dysfunction with inhibited sexual excitement   . Insomnia, unspecified   . Hypokalemia   . Allergic rhinitis   . HTN (hypertension)    No past surgical history on file. Family History  Problem Relation Age of Onset  . Lung disease Father   . Gout Other   . Heart disease Other   . Hypertension Other    History   Social History  . Marital Status: Married    Spouse Name: N/A    Number of Children: N/A  . Years of Education: N/A   Occupational History  . Part Time Ortho Tech [employer]   Social History Main Topics  . Smoking status: Not on file  . Smokeless tobacco: Not on file  . Alcohol Use:   . Drug Use:   . Sexually Active:    Other Topics Concern  . Not on file   Social History Narrative   HSGTrained as orthopedic techMarried-divorced; remarried '782 sons - '68, '79       Review of Systems Review of Systems  Constitutional:  Negative for fever, chills, activity change and unexpected weight change.  HEENT:  Negative for hearing loss, ear pain, congestion, neck stiffness and postnasal drip. Negative for sore throat or swallowing problems. Negative for dental complaints.   Eyes: Negative for vision loss or change in visual acuity.  Respiratory:  Negative for chest tightness and wheezing.   Cardiovascular: Negative for chest pain and palpitation. No decreased exercise tolerance Gastrointestinal: No change in bowel habit. No bloating or gas. No reflux or indigestion Genitourinary: Negative for urgency, frequency, flank pain and difficulty urinating.  Musculoskeletal: Negative for myalgias, back pain, arthralgias and gait problem.  Neurological: Negative for dizziness, tremors, weakness and headaches.  Hematological: Negative for adenopathy.  Psychiatric/Behavioral: Negative for behavioral problems and dysphoric mood.       Objective:   Physical Exam Vitals reviewed Gen'l - WNWD AA man in no distress HEENT- [place]/AT, C&S clear Neck Supple, no thyromegaly Chest - CTAP Cor- RRR Ext w/o edema Derm - large scar posterior cdrvical region from left scapula to below the right ear. Scar is old, well healed but thickened and firm. Not tender.       Assessment & Plan:  1. Scar tissue - patient with thickened scar tissue from old trauma that is now causing him discomfort.  Plan - refer to Dr. Shon Hough for scar revision

## 2010-12-08 NOTE — Assessment & Plan Note (Signed)
Patient has been seeing Dr. Shana Chute who has him on Exforge 10/320 and clonidine patch. His BP is pretty well controlled.  Plan - continue present regimen

## 2011-08-23 ENCOUNTER — Ambulatory Visit (INDEPENDENT_AMBULATORY_CARE_PROVIDER_SITE_OTHER): Payer: Medicare Other | Admitting: Internal Medicine

## 2011-08-23 ENCOUNTER — Encounter: Payer: Self-pay | Admitting: Internal Medicine

## 2011-08-23 DIAGNOSIS — R319 Hematuria, unspecified: Secondary | ICD-10-CM

## 2011-08-23 LAB — POCT URINALYSIS DIPSTICK
Glucose, UA: NEGATIVE
Ketones, UA: NEGATIVE
Leukocytes, UA: NEGATIVE
Nitrite, UA: NEGATIVE
Protein, UA: NEGATIVE
Spec Grav, UA: 1.01

## 2011-08-23 MED ORDER — CLONIDINE HCL 0.3 MG/24HR TD PTWK: 1.0000 | MEDICATED_PATCH | TRANSDERMAL | Status: DC

## 2011-08-23 NOTE — Progress Notes (Signed)
  Subjective:    Patient ID: Drew Harrington, male    DOB: June 02, 1946, 66 y.o.   MRN: [id number]  HPI Drew Harrington presents with a h/o gross hematuria x 1. This was not associated with pain in the low abdomen, pain in the back or perineum, decreased flow, dripple or other signs of prostatism. He has had no night sweats , no change in weight. He has no GU complaints.  Past Medical History  Diagnosis Date  . Unspecified hemorrhoids without mention of complication   . Benign neoplasm of colon   . Psychosexual dysfunction with inhibited sexual excitement   . Insomnia, unspecified   . Hypokalemia   . Allergic rhinitis   . HTN (hypertension)    No past surgical history on file. Family History  Problem Relation Age of Onset  . Lung disease Father   . Gout Other   . Heart disease Other   . Hypertension Other    History   Social History  . Marital Status: Married    Spouse Name: N/A    Number of Children: N/A  . Years of Education: N/A   Occupational History  . Part Time Ortho Tech [place]   Social History Main Topics  . Smoking status: Former Games developer  . Smokeless tobacco: Not on file  . Alcohol Use: Not on file  . Drug Use: Not on file  . Sexually Active: Not on file   Other Topics Concern  . Not on file   Social History Narrative   HSGTrained as orthopedic techMarried-divorced; remarried '782 sons - '68, '79   Current Outpatient Prescriptions on File Prior to Visit  Medication Sig Dispense Refill  . amLODipine-valsartan (EXFORGE) 10-320 MG per tablet Take 1 tablet by mouth daily.  30 tablet  11  . Multiple Vitamins-Minerals (CENTRUM SILVER PO) Take by mouth.        . sertraline (ZOLOFT) 25 MG tablet Take 25 mg by mouth daily.            Review of Systems System review is negative for any constitutional, cardiac, pulmonary, GI or neuro symptoms or complaints other than as described in the HPI.     Objective:   Physical Exam Filed Vitals:   08/23/11 1307    BP: 138/88  Pulse: 81  Temp: 98.4 F (36.9 C)  Resp: 16   Gen'l- WNWD AA man in no distress PUlm- normal respirations Cor- RRR       Assessment & Plan:  Hematuria,painless - discussed the implications of painless hematuria in a man over 50 - potential sentinel sign for bladder or ureteral abnormality.  Plan - refer to Dr. Patsi Harrington for evaluation - r/o neoplasm.  (greater than 50% of 15 min visit spent on education and counseling)

## 2011-09-16 ENCOUNTER — Other Ambulatory Visit: Payer: Self-pay | Admitting: Urology

## 2011-09-16 MED ORDER — MITOMYCIN CHEMO FOR BLADDER INSTILLATION 40 MG: 40.0000 mg | Freq: Once | INTRAVENOUS | Status: DC

## 2011-10-05 ENCOUNTER — Encounter (HOSPITAL_BASED_OUTPATIENT_CLINIC_OR_DEPARTMENT_OTHER): Payer: Self-pay | Admitting: *Deleted

## 2011-10-06 ENCOUNTER — Encounter (HOSPITAL_BASED_OUTPATIENT_CLINIC_OR_DEPARTMENT_OTHER): Payer: Self-pay | Admitting: *Deleted

## 2011-10-06 NOTE — Progress Notes (Signed)
NPO AFTER MN. ARRIVES AT 1000. NEEDS ISTAT AND EKG. WILL TAKE ATENOLOL AM OF SURG. W/ SIP OF WATER.

## 2011-10-07 NOTE — H&P (Addendum)
HPI Pt with gross hematuria. Patient complained of drops of blood at the end of urine flow Feb 2013. It lasted about 2-3 days, then recurred 3 weeks ago for 2-3 days. A UA dip showed moderate blood.   He smoked for 20 yrs, but he quit 19 yrs ago. No exposure to chemotherapy, radiation, organic dye or solvent.   He's had no intermittent or weak stream. No dysuria. He has no bothersome frequency or urgency. He has no incontinence. Has had no trouble with erections. No h/o kidney stones. No GU surgery.  Interval Hx  He follows up today to review CT scan of for cystoscopy.  I reviewed his CT scan images. Unofficially he has a 2 cm left adrenal nodule that contains negative HU. There is a filling defect on the left bladder wall worrisome for neoplasm.  His CMP and his PSA were normal March 2013.   I discussed the findings with the patient on CT in the nature risks and benefits of cystoscopy. He elects to proceed.   Past Medical History Problems  1. History of  Hypertension 401.9  Surgical History Problems  1. History of  Cataract Surgery Bilateral  Current Meds 1. CloNIDine HCl 0.2 MG Oral Tablet; Therapy: (Recorded:12Mar2013) to 2. Exforge 10-320 MG Oral Tablet; Therapy: (Recorded:12Mar2013) to 3. Multi-Vitamin TABS; Therapy: (Recorded:12Mar2013) to 4. Valsartan-Hydrochlorothiazide TABS; Therapy: (Recorded:12Mar2013) to  Allergies Medication  1. No Known Drug Allergies  Family History Problems  1. Sororal history of  Cancer 2. Family history of  Death In The Family Father 3. Family history of  Death In The Family Mother 4. Fraternal history of  Diabetes Mellitus V18.0 5. Family history of  Family Health Status Number Of Children 2 sons 1 daughter  Social History Problems  1. Caffeine Use 4 tea per day 2. Former Smoker V15.82 smoked 1ppd for 32yrs quit 59yrs ago 3. Marital History - Currently Married 4. Occupation: Semi Retired Nurse, children's  5. History of  Alcohol  Use  Review of Systems Constitutional, cardiovascular and pulmonary system(s) were reviewed and pertinent findings if present are noted.    Physical Exam Constitutional: Well nourished and well developed . No acute distress.  Pulmonary: No respiratory distress and normal respiratory rhythm and effort.  Cardiovascular: Heart rate and rhythm are normal . No peripheral edema.  Genitourinary: Examination of the penis demonstrates phimosis.  Neuro/Psych:. Mood and affect are appropriate.    Results/Data Urine [Data Includes: Last 1 Day]   20Mar2013  COLOR YELLOW   APPEARANCE CLEAR   SPECIFIC GRAVITY 1.010   pH 7.0   GLUCOSE NEG mg/dL  BILIRUBIN NEG   KETONE NEG mg/dL  BLOOD TRACE   PROTEIN NEG mg/dL  UROBILINOGEN 0.2 mg/dL  NITRITE NEG   LEUKOCYTE ESTERASE NEG   SQUAMOUS EPITHELIAL/HPF NONE SEEN   WBC NONE SEEN WBC/hpf  RBC NONE SEEN RBC/hpf  BACTERIA NONE SEEN   CRYSTALS NONE SEEN   CASTS NONE SEEN    Procedure  Procedure: Cystoscopy  Informed Consent: Risks, benefits, and potential adverse events were discussed and informed consent was obtained from the patient.  Prep: The patient was prepped with betadine.  Antibiotic prophylaxis: Ciprofloxacin.  Procedure Note:  Urethral meatus:. No abnormalities.  Anterior urethra: No abnormalities.  Prostatic urethra: No abnormalities . The lateral and median prostatic lobes were enlarged. An enlarged intravesical median lobe was visualized.  Bladder: Visulization was clear. The ureteral orifices were in the normal anatomic position bilaterally and had clear efflux of urine. A systematic survey  of the bladder demonstrated no bladder tumors or stones. The mucosa was smooth without abnormalities. 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 This tumor was located on the left side, at the base of the bladder. The patient tolerated the procedure well.  Complications: None.     Assessment Assessed  1. Benign Prostatic Hypertrophy 600.00 2. Gross Hematuria 599.71 3. 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 Phimosis 605  Plan Bladder Neoplasm Of Uncertain Behavior (236.7), Phimosis (605)  1. Follow-up Schedule Surgery Office  Follow-up  Done: 20Mar2013 Health Maintenance (V70.0)  2. UA With REFLEX  Done: 20Mar2013 08:46AM  Discussion/Summary  I discussed the findings of a papillary tumor at the left bladder base with the patient. We discussed the nature risks benefits and alternatives to transurethral resection of bladder tumor with postoperative mitomycin C. instillation. All questions answered and he elects to proceed. He requested circumcision since he will be in the OR. We also discussed the nature risks benefits and alternatives to circumcision including surveillance or dorsal slit. We discussed risk of circumcision such as bleeding infection poor cosmesis and sexual dysfunction (decreased sensitivity, trouble with erections) among others. We also discussed the likelihood of achieving the goals of both procedures and potential problems that might occur during the procedures or recuperation. All questions answered. The patient elects to proceed with TURBT/MMC and circumcision.   ADD: Pt seen and examined today. He is well. No CP, SOB hematuria or dysuria. We discussed again circumcision, TURBT and MMC instillation. We discussed alternatives. We discussed the nature, R/B of these procedures. He elects to proceed as planned.

## 2011-10-08 ENCOUNTER — Ambulatory Visit (HOSPITAL_BASED_OUTPATIENT_CLINIC_OR_DEPARTMENT_OTHER): Payer: Medicare Other | Admitting: Anesthesiology

## 2011-10-08 ENCOUNTER — Encounter (HOSPITAL_BASED_OUTPATIENT_CLINIC_OR_DEPARTMENT_OTHER): Payer: Self-pay | Admitting: Anesthesiology

## 2011-10-08 ENCOUNTER — Encounter (HOSPITAL_BASED_OUTPATIENT_CLINIC_OR_DEPARTMENT_OTHER)
Admission: RE | Disposition: A | Discharge status: Home / Self Care | Payer: Self-pay | Source: Ambulatory Visit | Attending: Urology

## 2011-10-08 ENCOUNTER — Ambulatory Visit (HOSPITAL_BASED_OUTPATIENT_CLINIC_OR_DEPARTMENT_OTHER)
Admission: RE | Admit: 2011-10-08 | Discharge: 2011-10-08 | Disposition: A | Discharge status: Home / Self Care | Payer: Medicare Other | Source: Ambulatory Visit | Attending: Urology | Admitting: Urology

## 2011-10-08 ENCOUNTER — Encounter (HOSPITAL_BASED_OUTPATIENT_CLINIC_OR_DEPARTMENT_OTHER): Payer: Self-pay | Admitting: *Deleted

## 2011-10-08 DIAGNOSIS — N400 Enlarged prostate without lower urinary tract symptoms: Secondary | ICD-10-CM | POA: Insufficient documentation

## 2011-10-08 DIAGNOSIS — R310 Gross hematuria: Secondary | ICD-10-CM | POA: Insufficient documentation

## 2011-10-08 DIAGNOSIS — I10 Essential (primary) hypertension: Secondary | ICD-10-CM | POA: Insufficient documentation

## 2011-10-08 DIAGNOSIS — N471 Phimosis: Secondary | ICD-10-CM | POA: Insufficient documentation

## 2011-10-08 DIAGNOSIS — C679 Malignant neoplasm of bladder, unspecified: Secondary | ICD-10-CM | POA: Insufficient documentation

## 2011-10-08 DIAGNOSIS — N478 Other disorders of prepuce: Secondary | ICD-10-CM | POA: Insufficient documentation

## 2011-10-08 DIAGNOSIS — Z9849 Cataract extraction status, unspecified eye: Secondary | ICD-10-CM | POA: Insufficient documentation

## 2011-10-08 DIAGNOSIS — Z79899 Other long term (current) drug therapy: Secondary | ICD-10-CM | POA: Insufficient documentation

## 2011-10-08 HISTORY — PX: TRANSURETHRAL RESECTION OF BLADDER TUMOR: SHX2575

## 2011-10-08 HISTORY — DX: Prediabetes: R73.03

## 2011-10-08 LAB — POCT I-STAT 4, (NA,K, GLUC, HGB,HCT)
Glucose, Bld: 123 mg/dL — ABNORMAL HIGH (ref 70–99)
Potassium: 3 mEq/L — ABNORMAL LOW (ref 3.5–5.1)

## 2011-10-08 SURGERY — TURBT (TRANSURETHRAL RESECTION OF BLADDER TUMOR)
Anesthesia: General | Site: Penis | Wound class: Clean Contaminated

## 2011-10-08 MED ORDER — LIDOCAINE HCL 2 % EX GEL
CUTANEOUS | Status: DC | PRN
  Administered 2011-10-08: 1

## 2011-10-08 MED ORDER — LACTATED RINGERS IV SOLN
INTRAVENOUS | Status: DC | PRN
  Administered 2011-10-08 (×2): via INTRAVENOUS

## 2011-10-08 MED ORDER — FENTANYL CITRATE 0.05 MG/ML IJ SOLN
INTRAMUSCULAR | Status: DC | PRN
  Administered 2011-10-08: 25 ug via INTRAVENOUS
  Administered 2011-10-08: 50 ug via INTRAVENOUS
  Administered 2011-10-08: 25 ug via INTRAVENOUS
  Administered 2011-10-08 (×2): 50 ug via INTRAVENOUS

## 2011-10-08 MED ORDER — EPHEDRINE SULFATE 50 MG/ML IJ SOLN
INTRAMUSCULAR | Status: DC | PRN
  Administered 2011-10-08 (×2): 20 mg via INTRAVENOUS
  Administered 2011-10-08: 10 mg via INTRAVENOUS

## 2011-10-08 MED ORDER — CEFAZOLIN SODIUM 1-5 GM-% IV SOLN: 1.0000 g | INTRAVENOUS | Status: DC

## 2011-10-08 MED ORDER — OXYCODONE-ACETAMINOPHEN 5-325 MG PO TABS: 1.0000 | ORAL_TABLET | ORAL | Status: AC | PRN

## 2011-10-08 MED ORDER — ONDANSETRON HCL 4 MG/2ML IJ SOLN
INTRAMUSCULAR | Status: DC | PRN
  Administered 2011-10-08: 4 mg via INTRAVENOUS

## 2011-10-08 MED ORDER — SULFAMETHOXAZOLE-TMP DS 800-160 MG PO TABS: 1.0000 | ORAL_TABLET | Freq: Two times a day (BID) | ORAL | Status: AC

## 2011-10-08 MED ORDER — SODIUM CHLORIDE 0.9 % IR SOLN
Status: DC | PRN
  Administered 2011-10-08: 9000 mL

## 2011-10-08 MED ORDER — CEFAZOLIN SODIUM 1-5 GM-% IV SOLN
INTRAVENOUS | Status: DC | PRN
  Administered 2011-10-08: 2 g via INTRAVENOUS

## 2011-10-08 MED ORDER — CEFAZOLIN SODIUM-DEXTROSE 2-3 GM-% IV SOLR: 2.0000 g | INTRAVENOUS | Status: DC

## 2011-10-08 MED ORDER — ATENOLOL 25 MG PO TABS
25.0000 mg | ORAL_TABLET | Freq: Once | ORAL | Status: AC
  Administered 2011-10-08: 25 mg via ORAL

## 2011-10-08 MED ORDER — LACTATED RINGERS IV SOLN
INTRAVENOUS | Status: DC
  Administered 2011-10-08: 11:00:00 via INTRAVENOUS

## 2011-10-08 MED ORDER — DOCUSATE SODIUM 100 MG PO CAPS: 100.0000 mg | ORAL_CAPSULE | Freq: Two times a day (BID) | ORAL | Status: AC

## 2011-10-08 MED ORDER — GLYCOPYRROLATE 0.2 MG/ML IJ SOLN
INTRAMUSCULAR | Status: DC | PRN
  Administered 2011-10-08: 0.2 mg via INTRAVENOUS

## 2011-10-08 MED ORDER — LIDOCAINE HCL (CARDIAC) 20 MG/ML IV SOLN
INTRAVENOUS | Status: DC | PRN
  Administered 2011-10-08: 100 mg via INTRAVENOUS

## 2011-10-08 MED ORDER — PROPOFOL 10 MG/ML IV EMUL
INTRAVENOUS | Status: DC | PRN
  Administered 2011-10-08: 200 mg via INTRAVENOUS

## 2011-10-08 MED ORDER — DEXAMETHASONE SODIUM PHOSPHATE 4 MG/ML IJ SOLN
INTRAMUSCULAR | Status: DC | PRN
  Administered 2011-10-08: 4 mg via INTRAVENOUS

## 2011-10-08 MED ORDER — SUCCINYLCHOLINE CHLORIDE 20 MG/ML IJ SOLN
INTRAMUSCULAR | Status: DC | PRN
  Administered 2011-10-08: 30 mg via INTRAVENOUS
  Administered 2011-10-08: 50 mg via INTRAVENOUS

## 2011-10-08 MED ORDER — FENTANYL CITRATE 0.05 MG/ML IJ SOLN: 25.0000 ug | INTRAMUSCULAR | Status: DC | PRN

## 2011-10-08 SURGICAL SUPPLY — 50 items
BAG DRAIN URO-CYSTO SKYTR STRL (DRAIN) ×3 IMPLANT
BAG DRN ANRFLXCHMBR STRAP LEK (BAG)
BAG DRN UROCATH (DRAIN) ×2
BAG URINE DRAINAGE (UROLOGICAL SUPPLIES) ×1 IMPLANT
BAG URINE LEG 19OZ MD ST LTX (BAG) IMPLANT
BLADE SURG 15 STRL LF DISP TIS (BLADE) ×2 IMPLANT
BLADE SURG 15 STRL SS (BLADE) ×3
BNDG COHESIVE 1X5 TAN STRL LF (GAUZE/BANDAGES/DRESSINGS) ×3 IMPLANT
CANISTER SUCT LVC 12 LTR MEDI- (MISCELLANEOUS) ×2 IMPLANT
CATH FOLEY 2WAY SLVR  5CC 18FR (CATHETERS) ×1
CATH FOLEY 2WAY SLVR  5CC 20FR (CATHETERS)
CATH FOLEY 2WAY SLVR  5CC 22FR (CATHETERS)
CATH FOLEY 2WAY SLVR 5CC 18FR (CATHETERS) IMPLANT
CATH FOLEY 2WAY SLVR 5CC 20FR (CATHETERS) IMPLANT
CATH FOLEY 2WAY SLVR 5CC 22FR (CATHETERS) IMPLANT
CLOTH BEACON ORANGE TIMEOUT ST (SAFETY) ×3 IMPLANT
COVER MAYO STAND STRL (DRAPES) ×3 IMPLANT
COVER TABLE BACK 60X90 (DRAPES) ×3 IMPLANT
DRAPE CAMERA CLOSED 9X96 (DRAPES) ×3 IMPLANT
DRAPE PED LAPAROTOMY (DRAPES) ×3 IMPLANT
DRESSING TELFA 8X3 (GAUZE/BANDAGES/DRESSINGS) IMPLANT
ELECT BUTTON BIOP 24F 90D PLAS (MISCELLANEOUS) IMPLANT
ELECT LOOP HF 26F 30D .35MM (CUTTING LOOP) IMPLANT
ELECT LOOP MED HF 24F 12D CBL (CLIP) ×1 IMPLANT
ELECT REM PT RETURN 9FT ADLT (ELECTROSURGICAL) ×3
ELECTRODE REM PT RTRN 9FT ADLT (ELECTROSURGICAL) ×2 IMPLANT
EVACUATOR MICROVAS BLADDER (UROLOGICAL SUPPLIES) IMPLANT
GAUZE VASELINE 1X8 (GAUZE/BANDAGES/DRESSINGS) ×3 IMPLANT
GLOVE BIO SURGEON STRL SZ7.5 (GLOVE) ×3 IMPLANT
GOWN PREVENTION PLUS LG XLONG (DISPOSABLE) ×3 IMPLANT
GOWN STRL REIN XL XLG (GOWN DISPOSABLE) ×3 IMPLANT
GOWN SURGICAL LARGE (GOWNS) ×2 IMPLANT
GUIDEWIRE STR DUAL SENSOR (WIRE) ×1 IMPLANT
HOLDER FOLEY CATH W/STRAP (MISCELLANEOUS) ×1 IMPLANT
KIT ASPIRATION TUBING (SET/KITS/TRAYS/PACK) ×1 IMPLANT
LOOP CUTTING 24FR OLYMPUS (CUTTING LOOP) IMPLANT
NDL HYPO 25X1 1.5 SAFETY (NEEDLE) ×2 IMPLANT
NEEDLE HYPO 25X1 1.5 SAFETY (NEEDLE) ×3 IMPLANT
NS IRRIG 500ML POUR BTL (IV SOLUTION) ×1 IMPLANT
PACK BASIN DAY SURGERY FS (CUSTOM PROCEDURE TRAY) ×3 IMPLANT
PACK CYSTOSCOPY (CUSTOM PROCEDURE TRAY) ×3 IMPLANT
PENCIL BUTTON HOLSTER BLD 10FT (ELECTRODE) ×3 IMPLANT
PLUG CATH AND CAP STER (CATHETERS) ×1 IMPLANT
SET ASPIRATION TUBING (TUBING) IMPLANT
SUT CHROMIC 3 0 PS 2 (SUTURE) IMPLANT
SUT CHROMIC 3 0 SH 27 (SUTURE) ×6 IMPLANT
SUT SILK 2 0 (SUTURE)
SUT SILK 2-0 18XBRD TIE 12 (SUTURE) IMPLANT
SYR CONTROL 10ML LL (SYRINGE) IMPLANT
WATER STERILE IRR 500ML POUR (IV SOLUTION) IMPLANT

## 2011-10-08 NOTE — Anesthesia Preprocedure Evaluation (Addendum)
Anesthesia Evaluation  Patient identified by MRN, date of birth, ID band Patient awake    Reviewed: Allergy & Precautions, H&P , NPO status , Patient's Chart, lab work & pertinent test results  Airway Mallampati: II TM Distance: >3 FB Neck ROM: Full    Dental No notable dental hx.    Pulmonary neg pulmonary ROS, former smoker breath sounds clear to auscultation  Pulmonary exam normal       Cardiovascular hypertension, Pt. on medications and Pt. on home beta blockers Rhythm:Regular Rate:Normal  BP 185/102. On catapress. Will give atenolol now. Has not had atenolol in a few days. No headaches, no blurred vision. Feels fine.   Neuro/Psych Anxiety negative neurological ROS     GI/Hepatic negative GI ROS, Neg liver ROS,   Endo/Other  Borderline diabetes.  Renal/GU H/o hypokalemia. K 3.0  negative genitourinary   Musculoskeletal negative musculoskeletal ROS (+)   Abdominal   Peds negative pediatric ROS (+)  Hematology negative hematology ROS (+)   Anesthesia Other Findings   Reproductive/Obstetrics negative OB ROS                       Anesthesia Physical Anesthesia Plan  ASA: II  Anesthesia Plan: General   Post-op Pain Management:    Induction: Intravenous  Airway Management Planned: LMA  Additional Equipment:   Intra-op Plan:   Post-operative Plan: Extubation in OR  Informed Consent: I have reviewed the patients History and Physical, chart, labs and discussed the procedure including the risks, benefits and alternatives for the proposed anesthesia with the patient or authorized representative who has indicated his/her understanding and acceptance.   Dental advisory given  Plan Discussed with: CRNA  Anesthesia Plan Comments:         Anesthesia Quick Evaluation

## 2011-10-08 NOTE — Brief Op Note (Signed)
10/08/2011  1:46 PM  PATIENT:  Drew Harrington  66 y.o. male  PRE-OPERATIVE DIAGNOSIS:  GROSS HEMATURIA, BLADDER NEOPLASM AND PHIMOSIS  POST-OPERATIVE DIAGNOSIS:  GROSS HEMATURIA, BLADDER NEOPLASM AND PHIMOSIS  PROCEDURE:  Procedure(s) (LRB): TRANSURETHRAL RESECTION OF BLADDER TUMOR (TURBT) 2-5 cm CIRCUMCISION ADULT (N/A)  SURGEON:  Surgeon(s) and Role:    * Antony Haste, MD - Primary   ANESTHESIA:   general  [id number]:[id number]  BLOOD ADMINISTERED:none  DRAINS: 18 Fr 2-way foley  LOCAL MEDICATIONS USED:  LIDOCAINE ointment  SPECIMEN:  Biopsy / Limited Resection 1) FORESKIN, 2) BLADDER TUMOR chips, 3) BLADDER TUMOR BASE  DISPOSITION OF SPECIMEN:  PATHOLOGY  COUNTS:  YES  DICTATION:  098119  PLAN OF CARE: Discharge to home after PACU  PATIENT DISPOSITION:  PACU - hemodynamically stable.   Delay start of Pharmacological VTE agent (>24hrs) due to surgical blood loss or risk of bleeding: n/a

## 2011-10-08 NOTE — Anesthesia Procedure Notes (Signed)
Procedure Name: LMA Insertion Date/Time: 10/08/2011 12:09 PM Performed by: Jessica Priest Pre-anesthesia Checklist: Patient identified, Emergency Drugs available, Suction available and Patient being monitored Patient Re-evaluated:Patient Re-evaluated prior to inductionOxygen Delivery Method: Circle System Utilized Preoxygenation: Pre-oxygenation with 100% oxygen Intubation Type: IV induction Ventilation: Mask ventilation without difficulty LMA: LMA with gastric port inserted LMA Size: 5.0 Number of attempts: 1 Placement Confirmation: positive ETCO2 Tube secured with: Tape Dental Injury: Teeth and Oropharynx as per pre-operative assessment

## 2011-10-08 NOTE — Transfer of Care (Signed)
Immediate Anesthesia Transfer of Care Note  Patient: Drew Harrington  Procedure(s) Performed: Procedure(s) (LRB): TRANSURETHRAL RESECTION OF BLADDER TUMOR (TURBT) (N/A) CIRCUMCISION ADULT (N/A)  Patient Location: PACU  Anesthesia Type: General  Level of Consciousness: awake, sedated, patient cooperative and responds to stimulation  Airway & Oxygen Therapy: Patient Spontanous Breathing and Patient connected to face mask oxygen  Post-op Assessment: Report given to PACU RN, Post -op Vital signs reviewed and stable and Patient moving all extremities  Post vital signs: Reviewed and stable  Complications: No apparent anesthesia complications

## 2011-10-08 NOTE — Discharge Instructions (Addendum)
Transurethral Resection, Bladder Tumor and CIRCUMCISION (see below) A cancerous growth (tumor) can develop on the inside wall of the bladder. The bladder is the organ that holds urine. One way to remove the tumor is a procedure called a transurethral resection. The tumor is removed (resected) through the tube that carries urine from the bladder out of the body (urethra). No cuts (incisions) are made in the skin. Instead, the procedure is done through a thin telescope, called a resectoscope. Attached to it is a light and usually a tiny camera. The resectoscope is put into the urethra. In men, the urethra opens at the end of the penis. In women, it opens just above the vagina.  A transurethral resection is usually used to remove tumors that have not gotten too big or too deep. These are called Stage 0, Stage 1 or Stage 2 bladder cancers. RISKS AND COMPLICATIONS This is usually a safe procedure. Every procedure has risks, though. For a transurethral resection, they include:  Infection. Antibiotic medication would need to be taken.   Bleeding.   Light bleeding may last for several days after the procedure.   If bleeding continues or is heavy, the bladder may need rinsing. Or, a new catheter might be put in for awhile.   Sometimes bed rest is needed.   Urination problems.   Pain and burning can occur when urinating. This usually goes away in a few days.   Scarring from the procedure can block the flow of urine.   Bladder damage.   It can be punctured or torn during removal of the tumor. If this happens, a catheter might be needed for longer. Antibiotics would be taken while the bladder heals.   Urine can leak through the hole or tear into the abdomen. If this happens, surgery may be needed to repair the bladder.   SEEK IMMEDIATE MEDICAL CARE IF:   There is an increase in blood in the urine or you are passing clots.   There is difficulty passing urine.   You develop the chills.   You  have an oral temperature above 102 F (38.9 C), not controlled by medicine.   Belly (abdominal) pain develops.   Circumcision, Adult Care After  These instructions give you information on caring for yourself after your procedure. Your doctor may also give you more specific instructions. Call your doctor if you have any problems or questions after your procedure. HOME CARE  Only take medicine as told by your doctor.   Any bandages (dressings) should stay on for at least 2 days. Ask your doctor when to take the bandage off.   Carefully remove the bandage if it gets dirty. Apply medicated cream to the surgical cut (incision). Carefully put a new bandage on.   Do not have sex until your doctor says it is okay.   Do not get your wound wet for 2 days or as told by your doctor.   You may take a sponge bath. Clean around the surgical cut gently with mild soap and water.   You may take a shower after 2 days or as told by your doctor. Do not take a tub bath. After you shower, gently pat your surgical cut dry. Do not rub it.   Your catheter can get wet in the shower.  Avoid heavy lifting.   Avoid contact sports, biking, or swimming until you have healed.  GET HELP RIGHT AWAY IF:   You develop a fever of 102 F (38.9 C) or  higher.   Your catheter stops draining.  Your urine may be red and this is OK if the catheter is draining. Drink plenty of fluids.  You develop heavy bleeding or clots in the urine   Your pain is not helped by medicines.   There is more redness or puffiness (swelling) than expected.   There is yellowish white fluid (pus) coming from your wound.   You have bleeding that does not stop when you press on it.   You are not healing as fast as you should be.  MAKE SURE YOU:  Understand these instructions.   Will watch your condition.   Will get help right away if you are not doing well or get worse.  Document Released: 12/01/2007 Document Revised: 06/03/2011  Document Reviewed: 12/01/2007 ExitCare Patient Information 2012 ExitCare, [place] Post Anesthesia Home Care Instructions  Activity: Get plenty of rest for the remainder of the day. A responsible adult should stay with you for 24 hours following the procedure.  For the next 24 hours, DO NOT: -Drive a car -Advertising copywriter -Drink alcoholic beverages -Take any medication unless instructed by your physician -Make any legal decisions or sign important papers.  Meals: Start with liquid foods such as gelatin or soup. Progress to regular foods as tolerated. Avoid greasy, spicy, heavy foods. If nausea and/or vomiting occur, drink only clear liquids until the nausea and/or vomiting subsides. Call your physician if vomiting continues.  Special Instructions/Symptoms: Your throat may feel dry or sore from the anesthesia or the breathing tube placed in your throat during surgery. If this causes discomfort, gargle with warm salt water. The discomfort should disappear within 24 hours.  C.

## 2011-10-09 NOTE — Anesthesia Postprocedure Evaluation (Signed)
  Anesthesia Post-op Note  Patient: Drew Harrington  Procedure(s) Performed: Procedure(s) (LRB): TRANSURETHRAL RESECTION OF BLADDER TUMOR (TURBT) (N/A) CIRCUMCISION ADULT (N/A)  Patient Location: PACU  Anesthesia Type: General  Level of Consciousness: awake and alert   Airway and Oxygen Therapy: Patient Spontanous Breathing  Post-op Pain: mild  Post-op Assessment: Post-op Vital signs reviewed, Patient's Cardiovascular Status Stable, Respiratory Function Stable, Patent Airway and No signs of Nausea or vomiting  Post-op Vital Signs: stable  Complications: No apparent anesthesia complications. BP at baseline. He is to get back on his regularly scheduled antihypertensives.

## 2011-10-11 ENCOUNTER — Encounter (HOSPITAL_BASED_OUTPATIENT_CLINIC_OR_DEPARTMENT_OTHER): Payer: Self-pay | Admitting: Urology

## 2011-10-11 NOTE — Op Note (Signed)
NAME:  Drew Harrington, Drew Harrington NO.:  [id number]  MEDICAL RECORD NO.:  [id number]  LOCATION:                               FACILITY:  [facility]  PHYSICIAN:  Jerilee Field, MD   DATE OF BIRTH:  Feb 09, 1946  DATE OF PROCEDURE: DATE OF DISCHARGE:                              OPERATIVE REPORT   PREOPERATIVE DIAGNOSES: 1. Bladder neoplasm. 2. Gross hematuria. 3. Phimosis.  POSTOPERATIVE DIAGNOSES: 1. Bladder neoplasm. 2. Gross hematuria. 3. Phimosis.  PROCEDURE: 1. Circumcision, adult. 2. Transurethral resection of bladder tumor, 2-5 cm.  SURGEON:  Jerilee Field, MD.  ANESTHESIA:  General.  INDICATION FOR PROCEDURE:  Mr. Garrelts is a 66 year old who presented with gross hematuria.  On cystoscopy, which was somewhat difficult due to a tight phimosis, the patient had a large bladder tumor over the left bladder wall.  CT scan showed no evidence of hydronephrosis or metastatic disease.  I discussed the findings with the patient, the nature, risks, benefits, and alternatives to transurethral resection of bladder tumor.  The patient also requested circumcision as he has had difficulty retracting the foreskin and cleaning the foreskin for some time.  We also discussed the nature, risks and benefits of circumcision. All questions were answered.  We discussed alternatives to TURBT and a circumcision including nontreatment or dorsal slit.  All questions were answered and he elected to proceed.  FINDINGS:  There was a tight phimosis, but the foreskin was normal without any lesion or mass on cystoscopy.  The bladder was then examined in its entirety.  The trigone and ureteral orifices were noted to be normal.  Lateral and superior to the left ureteral orifice, there was a large broad-based bladder tumor, papillary in appearance.  This was resected and completely removed.  There was no visual tumor remaining.  DESCRIPTION OF PROCEDURE:  After consent was obtained, the  patient was brought to the operating room, a time-out was performed to confirm the patient and procedure.  After adequate anesthesia, he was placed in lithotomy position and prepped and draped in the usual fashion.  I performed the circumcision first by marking the penile skin.  I had the impression that the coronal sulcus was going more distally on the ventral surface to prevent chordee.  This line was incised and down to Buck's fascia.  The foreskin needed a dorsal slit.  It was retracted and a circular line was marked on the foreskin going more proximal on the ventral surface again to prevent chordee.  Four hemostats were placed on the sleeve of foreskin and it was transected centrally and then removed with the aid of the Bovie cautery to cauterize the dartos fascia.  The foreskin was sent to Path.  Adequate hemostasis was ensured.  The skin was reapproximated with interrupted 3-0 chromic suture, which showed good skin coverage and no rotation.  The circumcision was then dressed, and the cystoscope was passed per urethra with the previous findings. The #26 [place] resectoscope was then advanced, but would not pass through the urethra, it was too tight.  Therefore the urethra was dilated to 28-French with sounds.  The scope was then passed in and the resectoscope handle attached.  Using bipolar energy,  the tumor was resected, starting inferiorly; however, when I got to the superior point it was right over the obturator reflex and the patient had an obturator cake.  He was then paralyzed and I began resecting and even with the paralysis he kicked again.  Therefore he was given more paralysis and then I was able to resect without any reflex.  The tumor was resected down to the mucosa and this was all sent as bladder tumor chips.  The remaining bladder tumor once level with the bladder into the muscularis was resected and the base of the tumor was sent separately as bladder tumor base.   Where he had an obturator reflex was a deep incision but there did not appear to be any perforation as there was no visible fat or other structures visible external to the bladder.  But given the obturator reflex happened twice and the resection was deep in this area, I decided against giving mitomycin-C.  Adequate hemostasis was ensured without any difficulty.  The bladder was drained and inspected at a low pressure and noted to have good hemostasis.  In addition, there was good clear efflux from the left ureteral orifice.  Initially there was no reflux, so I probed the orifice with a Sensor wire and it went up the ureter normally and when I removed the wire there was a good efflux of clear urine.  He was then awakened, taken to recovery room in stable condition.  COMPLICATIONS:  None.  DRAINS:  #18-French 2 way Foley.  BLOOD LOSS:  Minimal.  SPECIMEN: 1. Foreskin. 2. Bladder tumor chips. 3. Bladder tumor base.  Specimen to pathology.  DISPOSITION:  The patient was awakened and taken to recovery room in a stable condition.  Given his circumcision, level of resection and dilation of the urethra, I am going to leave a catheter in for a few days to allow the patient to recover and I will have him come back to the office next week to remove the urine that was draining clear.          ______________________________ Jerilee Field, MD     ME/MEDQ  D:  10/08/2011  T:  10/09/2011  Job:  [id number]

## 2011-10-29 ENCOUNTER — Other Ambulatory Visit: Payer: Self-pay | Admitting: Urology

## 2011-11-24 ENCOUNTER — Encounter (HOSPITAL_BASED_OUTPATIENT_CLINIC_OR_DEPARTMENT_OTHER): Payer: Self-pay | Admitting: *Deleted

## 2011-11-24 NOTE — Progress Notes (Signed)
NPO AFTER MN. ARRIVES AT 0615. NEEDS ISTAT. EKG IN EPIC AND CHART. WILL TAKE ATENOLOL AM OF SURG W/ SIP OF WATER.

## 2011-11-29 NOTE — H&P (Signed)
History of Present Illness      F/u gross hematuria and phimosis. He was taken to OR Apr 2013 for circumcision and TURBT (no MMC due to poss bladder perf).   Mar 2013 CT Mar 2013 -- Stable 2 cm left adrenal nodule that contains negative HU. There is a filling defect on the left bladder wall worrisome for neoplasm. Cystoscopy Mar 2013 - left bladder neoplasm  His CMP and his PSA were normal March 2013.   Apr 2013 path: high-grade TA with suspicion for T1 disease  Interval Hx He is well today. No dysuria or hematuria.    Past Medical History Problems  1. History of  Hypertension 401.9 2. History of  Phimosis 605  Surgical History Problems  1. History of  Cataract Surgery Bilateral 2. History of  Circumcision No Clamp/Device/Dorsal Slit Older Than 28 Days 3. History of  Cystoscopy With Fulguration Medium Lesion (2-5cm)  Current Meds 1. CloNIDine HCl 0.3 MG/24HR Transdermal Patch Weekly; Therapy: 11Jun2012 to 2. Exforge 10-320 MG Oral Tablet; Therapy: (Recorded:12Mar2013) to 3. Multi-Vitamin TABS; Therapy: (Recorded:12Mar2013) to 4. PARoxetine HCl 20 MG Oral Tablet; Therapy: 10Aug2012 to 5. Valsartan-Hydrochlorothiazide TABS; Therapy: (Recorded:12Mar2013) to  Allergies Medication  1. No Known Drug Allergies  Family History Problems  1. Sororal history of  Cancer 2. Family history of  Death In The Family Father 3. Family history of  Death In The Family Mother 4. Fraternal history of  Diabetes Mellitus V18.0 5. Family history of  Family Health Status Number Of Children 2 sons 1 daughter  Social History Problems  1. Caffeine Use 4 tea per day 2. Former Smoker V15.82 smoked 1ppd for 49yrs quit 18yrs ago 3. Marital History - Currently Married 4. Occupation: Semi Retired Nurse, children's  5. History of  Alcohol Use  Review of Systems Constitutional and gastrointestinal system(s) were reviewed and pertinent findings if present are noted.  Gastrointestinal: heartburn.      Vitals Vital Signs [Data Includes: Last 1 Day]  03May2013 02:14PM  BMI Calculated: 28.58 BSA Calculated: 2.03 Height: 5 ft 9 in Weight: 193 lb  Blood Pressure: 143 / 97, RUE, Sitting Temperature: 98.3 F, Oral Heart Rate: 86  Physical Exam Constitutional: Well nourished and well developed . No acute distress.  Pulmonary: No respiratory distress and normal respiratory rhythm and effort.  Cardiovascular: Heart rate and rhythm are normal . No peripheral edema.  Genitourinary: The penis is circumcised.  Well- healed  Neuro/Psych:. Mood and affect are appropriate.    Results/Data Urine [Data Includes: Last 1 Day]   03May2013  COLOR YELLOW   APPEARANCE CLEAR   SPECIFIC GRAVITY 1.030   pH 6.0   GLUCOSE NEG mg/dL  BILIRUBIN NEG   KETONE NEG mg/dL  BLOOD LARGE   PROTEIN 30 mg/dL  UROBILINOGEN 1 mg/dL  NITRITE NEG   LEUKOCYTE ESTERASE SMALL   SQUAMOUS EPITHELIAL/HPF NONE SEEN   WBC 21-50 WBC/hpf  RBC 11-20 RBC/hpf  BACTERIA NONE SEEN   CRYSTALS NONE SEEN   CASTS Hyaline casts noted   Other SM. AMT. MUCUS OBS.    Assessment Assessed  1. Bladder Cancer 188.9   HG Ta poss T1.   Plan Bladder Cancer (188.9)  1. Follow-up Schedule Surgery Office  Follow-up  Requested for: 03May2013 Health Maintenance (V70.0)  2. UA With REFLEX  Done: 03May2013 02:01PM  Discussion/Summary  We went over the stage, grade and prognosis. We discussed BCG nature, R/B and they will consider.   Given size, HG and poss T1 we discussed the  nature R/B/A of repeat TURBT. All questions answered. He and his wife elect to proceed.

## 2011-11-29 NOTE — Anesthesia Preprocedure Evaluation (Addendum)
Anesthesia Evaluation  Patient identified by MRN, date of birth, ID band Patient awake    Reviewed: Allergy & Precautions, H&P , NPO status , Patient's Chart, lab work & pertinent test results, reviewed documented beta blocker date and time   Airway Mallampati: II TM Distance: >3 FB Neck ROM: full    Dental  (+) Edentulous Upper and Edentulous Lower   Pulmonary neg pulmonary ROS,  breath sounds clear to auscultation  Pulmonary exam normal       Cardiovascular Exercise Tolerance: Good hypertension, Pt. on home beta blockers and Pt. on medications negative cardio ROS  Rhythm:regular Rate:Normal     Neuro/Psych Anxiety negative neurological ROS  negative psych ROS   GI/Hepatic negative GI ROS, Neg liver ROS,   Endo/Other  negative endocrine ROS  Renal/GU negative Renal ROS  negative genitourinary   Musculoskeletal   Abdominal   Peds  Hematology negative hematology ROS (+)   Anesthesia Other Findings   Reproductive/Obstetrics negative OB ROS                          Anesthesia Physical Anesthesia Plan  ASA: II  Anesthesia Plan: General   Post-op Pain Management:    Induction: Intravenous  Airway Management Planned: LMA  Additional Equipment:   Intra-op Plan:   Post-operative Plan:   Informed Consent: I have reviewed the patients History and Physical, chart, labs and discussed the procedure including the risks, benefits and alternatives for the proposed anesthesia with the patient or authorized representative who has indicated his/her understanding and acceptance.   Dental Advisory Given  Plan Discussed with: CRNA and Surgeon  Anesthesia Plan Comments:         Anesthesia Quick Evaluation

## 2011-11-30 ENCOUNTER — Encounter (HOSPITAL_BASED_OUTPATIENT_CLINIC_OR_DEPARTMENT_OTHER)
Admission: RE | Disposition: A | Discharge status: Home / Self Care | Payer: Self-pay | Source: Ambulatory Visit | Attending: Urology

## 2011-11-30 ENCOUNTER — Encounter (HOSPITAL_BASED_OUTPATIENT_CLINIC_OR_DEPARTMENT_OTHER): Payer: Self-pay | Admitting: Anesthesiology

## 2011-11-30 ENCOUNTER — Encounter (HOSPITAL_BASED_OUTPATIENT_CLINIC_OR_DEPARTMENT_OTHER): Payer: Self-pay | Admitting: *Deleted

## 2011-11-30 ENCOUNTER — Ambulatory Visit (HOSPITAL_BASED_OUTPATIENT_CLINIC_OR_DEPARTMENT_OTHER): Payer: Medicare Other | Admitting: Anesthesiology

## 2011-11-30 ENCOUNTER — Ambulatory Visit (HOSPITAL_BASED_OUTPATIENT_CLINIC_OR_DEPARTMENT_OTHER)
Admission: RE | Admit: 2011-11-30 | Discharge: 2011-11-30 | Disposition: A | Discharge status: Home / Self Care | Payer: Medicare Other | Source: Ambulatory Visit | Attending: Urology | Admitting: Urology

## 2011-11-30 DIAGNOSIS — N3080 Other cystitis without hematuria: Secondary | ICD-10-CM | POA: Insufficient documentation

## 2011-11-30 DIAGNOSIS — C679 Malignant neoplasm of bladder, unspecified: Secondary | ICD-10-CM | POA: Insufficient documentation

## 2011-11-30 DIAGNOSIS — N3289 Other specified disorders of bladder: Secondary | ICD-10-CM | POA: Insufficient documentation

## 2011-11-30 DIAGNOSIS — Z79899 Other long term (current) drug therapy: Secondary | ICD-10-CM | POA: Insufficient documentation

## 2011-11-30 DIAGNOSIS — I10 Essential (primary) hypertension: Secondary | ICD-10-CM | POA: Insufficient documentation

## 2011-11-30 DIAGNOSIS — N35919 Unspecified urethral stricture, male, unspecified site: Secondary | ICD-10-CM | POA: Insufficient documentation

## 2011-11-30 HISTORY — PX: TRANSURETHRAL RESECTION OF BLADDER TUMOR: SHX2575

## 2011-11-30 HISTORY — DX: Malignant neoplasm of bladder, unspecified: C67.9

## 2011-11-30 LAB — POCT I-STAT 4, (NA,K, GLUC, HGB,HCT)
Glucose, Bld: 135 mg/dL — ABNORMAL HIGH (ref 70–99)
HCT: 47 % (ref 39.0–52.0)
Potassium: 3.3 mEq/L — ABNORMAL LOW (ref 3.5–5.1)

## 2011-11-30 SURGERY — TURBT (TRANSURETHRAL RESECTION OF BLADDER TUMOR)
Anesthesia: General

## 2011-11-30 MED ORDER — SUCCINYLCHOLINE CHLORIDE 20 MG/ML IJ SOLN
INTRAMUSCULAR | Status: DC | PRN
  Administered 2011-11-30: 10 mg via INTRAVENOUS
  Administered 2011-11-30: 140 mg via INTRAVENOUS

## 2011-11-30 MED ORDER — FENTANYL CITRATE 0.05 MG/ML IJ SOLN
INTRAMUSCULAR | Status: DC | PRN
  Administered 2011-11-30 (×2): 50 ug via INTRAVENOUS

## 2011-11-30 MED ORDER — ROCURONIUM BROMIDE 100 MG/10ML IV SOLN
INTRAVENOUS | Status: DC | PRN
  Administered 2011-11-30: 10 mg via INTRAVENOUS

## 2011-11-30 MED ORDER — CEFAZOLIN SODIUM 1-5 GM-% IV SOLN: 1.0000 g | INTRAVENOUS | Status: DC

## 2011-11-30 MED ORDER — ONDANSETRON HCL 4 MG/2ML IJ SOLN
INTRAMUSCULAR | Status: DC | PRN
  Administered 2011-11-30: 4 mg via INTRAVENOUS

## 2011-11-30 MED ORDER — CEFAZOLIN SODIUM-DEXTROSE 2-3 GM-% IV SOLR
2.0000 g | INTRAVENOUS | Status: AC
  Administered 2011-11-30: 2 g via INTRAVENOUS

## 2011-11-30 MED ORDER — STERILE WATER FOR IRRIGATION IR SOLN
Status: DC | PRN
  Administered 2011-11-30: 500 mL

## 2011-11-30 MED ORDER — BELLADONNA ALKALOIDS-OPIUM 16.2-60 MG RE SUPP
RECTAL | Status: DC | PRN
  Administered 2011-11-30: 1 via RECTAL

## 2011-11-30 MED ORDER — URIBEL 118 MG PO CAPS: 1.0000 | ORAL_CAPSULE | Freq: Four times a day (QID) | ORAL | Status: DC | PRN

## 2011-11-30 MED ORDER — LACTATED RINGERS IV SOLN
INTRAVENOUS | Status: DC
  Administered 2011-11-30 (×2): via INTRAVENOUS

## 2011-11-30 MED ORDER — CIPROFLOXACIN HCL 250 MG PO TABS: 250.0000 mg | ORAL_TABLET | Freq: Two times a day (BID) | ORAL | Status: AC

## 2011-11-30 MED ORDER — GLYCOPYRROLATE 0.2 MG/ML IJ SOLN
INTRAMUSCULAR | Status: DC | PRN
  Administered 2011-11-30: 0.4 mg via INTRAVENOUS
  Administered 2011-11-30: 0.2 mg via INTRAVENOUS

## 2011-11-30 MED ORDER — LACTATED RINGERS IV SOLN: INTRAVENOUS | Status: DC

## 2011-11-30 MED ORDER — SODIUM CHLORIDE 0.9 % IR SOLN
Status: DC | PRN
  Administered 2011-11-30: 6000 mL

## 2011-11-30 MED ORDER — LIDOCAINE HCL (CARDIAC) 20 MG/ML IV SOLN
INTRAVENOUS | Status: DC | PRN
  Administered 2011-11-30: 60 mg via INTRAVENOUS

## 2011-11-30 MED ORDER — MITOMYCIN CHEMO FOR BLADDER INSTILLATION 40 MG
40.0000 mg | Freq: Once | INTRAVENOUS | Status: DC
  Filled 2011-11-30: qty 40

## 2011-11-30 MED ORDER — PROPOFOL 10 MG/ML IV EMUL
INTRAVENOUS | Status: DC | PRN
  Administered 2011-11-30: 200 mg via INTRAVENOUS

## 2011-11-30 MED ORDER — DEXAMETHASONE SODIUM PHOSPHATE 4 MG/ML IJ SOLN
INTRAMUSCULAR | Status: DC | PRN
  Administered 2011-11-30: 10 mg via INTRAVENOUS

## 2011-11-30 MED ORDER — FENTANYL CITRATE 0.05 MG/ML IJ SOLN: 25.0000 ug | INTRAMUSCULAR | Status: DC | PRN

## 2011-11-30 MED ORDER — NEOSTIGMINE METHYLSULFATE 1 MG/ML IJ SOLN
INTRAMUSCULAR | Status: DC | PRN
  Administered 2011-11-30: 3 mg via INTRAVENOUS

## 2011-11-30 SURGICAL SUPPLY — 30 items
BAG DRAIN URO-CYSTO SKYTR STRL (DRAIN) ×2 IMPLANT
BAG DRN ANRFLXCHMBR STRAP LEK (BAG)
BAG DRN UROCATH (DRAIN) ×1
BAG URINE DRAINAGE (UROLOGICAL SUPPLIES) ×1 IMPLANT
BAG URINE LEG 19OZ MD ST LTX (BAG) IMPLANT
CANISTER SUCT LVC 12 LTR MEDI- (MISCELLANEOUS) ×2 IMPLANT
CATH FOLEY 2WAY SLVR  5CC 20FR (CATHETERS) ×1
CATH FOLEY 2WAY SLVR  5CC 22FR (CATHETERS)
CATH FOLEY 2WAY SLVR 5CC 20FR (CATHETERS) IMPLANT
CATH FOLEY 2WAY SLVR 5CC 22FR (CATHETERS) IMPLANT
CLOTH BEACON ORANGE TIMEOUT ST (SAFETY) ×2 IMPLANT
DRAPE CAMERA CLOSED 9X96 (DRAPES) ×2 IMPLANT
DRESSING TELFA 8X3 (GAUZE/BANDAGES/DRESSINGS) IMPLANT
ELECT BUTTON BIOP 24F 90D PLAS (MISCELLANEOUS) IMPLANT
ELECT LOOP HF 26F 30D .35MM (CUTTING LOOP) IMPLANT
ELECT REM PT RETURN 9FT ADLT (ELECTROSURGICAL)
ELECTRODE REM PT RTRN 9FT ADLT (ELECTROSURGICAL) ×1 IMPLANT
EVACUATOR MICROVAS BLADDER (UROLOGICAL SUPPLIES) IMPLANT
GLOVE BIO SURGEON STRL SZ7.5 (GLOVE) ×2 IMPLANT
GLOVE BIOGEL M STRL SZ7.5 (GLOVE) ×2 IMPLANT
GLOVE ECLIPSE 7.0 STRL STRAW (GLOVE) ×2 IMPLANT
GOWN STRL REIN XL XLG (GOWN DISPOSABLE) ×3 IMPLANT
HOLDER FOLEY CATH W/STRAP (MISCELLANEOUS) IMPLANT
KIT ASPIRATION TUBING (SET/KITS/TRAYS/PACK) ×1 IMPLANT
LOOP CUTTING 24FR OLYMPUS (CUTTING LOOP) IMPLANT
NS IRRIG 500ML POUR BTL (IV SOLUTION) ×1 IMPLANT
PACK CYSTOSCOPY (CUSTOM PROCEDURE TRAY) ×2 IMPLANT
PLUG CATH AND CAP STER (CATHETERS) ×1 IMPLANT
SET ASPIRATION TUBING (TUBING) IMPLANT
WATER STERILE IRR 500ML POUR (IV SOLUTION) ×1 IMPLANT

## 2011-11-30 NOTE — Interval H&P Note (Signed)
History and Physical Interval Note:  11/30/2011 7:44 AM  Drew Harrington  has presented today for surgery, with the diagnosis of bladder cancer  The various methods of treatment have been discussed with the patient and family. After consideration of risks, benefits and other options for treatment, the patient has consented to  Procedure(s) (LRB): TRANSURETHRAL RESECTION OF BLADDER TUMOR (TURBT) (N/A) as a surgical intervention .  The patients' history has been reviewed, patient examined, no change in status, stable for surgery.  I have reviewed the patients' chart and labs.  Questions were answered to the patient's satisfaction.     Antony Haste

## 2011-11-30 NOTE — Brief Op Note (Signed)
11/30/2011  8:34 AM  PATIENT:  Drew Harrington  66 y.o. male  PRE-OPERATIVE DIAGNOSIS:  bladder cancer  POST-OPERATIVE DIAGNOSIS:  1) Urethral stricture, 2) bladder cancer, urethral stricture  PROCEDURE:  Procedure(s) (LRB): TRANSURETHRAL RESECTION OF BLADDER TUMOR (TURBT) 2-5 cm Dilation of urethral stricture Exam under anesthesia (EUA)  SURGEON:  Surgeon(s) and Role:    * Antony Haste, MD - Primary  ANESTHESIA:   general  Findings:  EUA - normal penis and testes without mass, circ well-healed; DRE - no mass of bladder or abdomen on palpation or bimanual; prostate normal on DRE without hard area or nodule, no BPE. Cysto - stricture of urethra at junction between bulb and penile urethra; bladder - no obvious tumor, edges prior resection with some fingerlike mucosa. This was all re-resected. Trigone with neovascularity - this was biopsied. Clear efflux from both UO's after all bx and resection. Good hemostasis.  TURBT - Despite discussing obturator reflex with anesthesia (they intubated and paralyzed) pt still had obturator reflex during resection which made re-resection very difficult.   EBL:  Total I/O In: 100 [I.V.:100] Out: -   BLOOD ADMINISTERED: minimal  DRAINS: Urinary Catheter (Foley)   LOCAL MEDICATIONS USED:  OTHER B&O   SPECIMEN:  Biopsy / Limited Resection 1) left bladder tumor site, 2) trigone  DISPOSITION OF SPECIMEN:  PATHOLOGY  DICTATION: 562130  PLAN OF CARE: Discharge to home after PACU  PATIENT DISPOSITION:  PACU - hemodynamically stable.   Delay start of Pharmacological VTE agent (>24hrs) due to surgical blood loss or risk of bleeding: not applicable

## 2011-11-30 NOTE — Transfer of Care (Signed)
Immediate Anesthesia Transfer of Care Note  Patient: Drew Harrington  Procedure(s) Performed: Procedure(s) (LRB): TRANSURETHRAL RESECTION OF BLADDER TUMOR (TURBT) (N/A)  Patient Location: Patient transported to PACU with oxygen via face mask at 4 Liters / Min  Anesthesia Type: General  Level of Consciousness: awake and alert   Airway & Oxygen Therapy: Patient Spontanous Breathing and Patient connected to face mask oxygen  Post-op Assessment: Report given to PACU RN and Post -op Vital signs reviewed and stable  Post vital signs: Reviewed and stable, BP elevated  Dentition: Teeth and oropharynx remain in pre-op condition  Complications: No apparent anesthesia complications

## 2011-11-30 NOTE — Anesthesia Procedure Notes (Signed)
Procedure Name: Intubation Date/Time: 11/30/2011 7:49 AM Performed by: Fran Lowes Pre-anesthesia Checklist: Patient identified, Emergency Drugs available, Suction available and Patient being monitored Patient Re-evaluated:Patient Re-evaluated prior to inductionOxygen Delivery Method: Circle System Utilized Preoxygenation: Pre-oxygenation with 100% oxygen Intubation Type: IV induction Ventilation: Mask ventilation without difficulty Laryngoscope Size: Mac and 4 Grade View: Grade I Tube type: Oral Tube size: 8.0 mm Number of attempts: 1 Airway Equipment and Method: stylet,  oral airway and LTA kit utilized Placement Confirmation: ETT inserted through vocal cords under direct vision,  positive ETCO2 and breath sounds checked- equal and bilateral Secured at: 22 cm Tube secured with: Tape Dental Injury: Teeth and Oropharynx as per pre-operative assessment

## 2011-11-30 NOTE — Anesthesia Postprocedure Evaluation (Signed)
  Anesthesia Post-op Note  Patient: Drew Harrington  Procedure(s) Performed: Procedure(s) (LRB): TRANSURETHRAL RESECTION OF BLADDER TUMOR (TURBT) (N/A)  Patient Location: PACU  Anesthesia Type: General  Level of Consciousness: awake and alert   Airway and Oxygen Therapy: Patient Spontanous Breathing  Post-op Pain: mild  Post-op Assessment: Post-op Vital signs reviewed, Patient's Cardiovascular Status Stable, Respiratory Function Stable, Patent Airway and No signs of Nausea or vomiting  Post-op Vital Signs: stable  Complications: No apparent anesthesia complications

## 2011-11-30 NOTE — Discharge Instructions (Signed)
Transurethral Resection, Bladder Tumor A cancerous growth (tumor) can develop on the inside wall of the bladder. The bladder is the organ that holds urine. One way to remove the tumor is a procedure called a transurethral resection. The tumor is removed (resected) through the tube that carries urine from the bladder out of the body (urethra). No cuts (incisions) are made in the skin. Instead, the procedure is done through a thin telescope, called a resectoscope. Attached to it is a light and usually a tiny camera. The resectoscope is put into the urethra. In men, the urethra opens at the end of the penis. In women, it opens just above the vagina.  A transurethral resection is usually used to remove tumors that have not gotten too big or too deep. These are called Stage 0, Stage 1 or Stage 2 bladder cancers. AFTER THE PROCEDURE   You will stay in a recovery area until the anesthesia has worn off. Your blood pressure and pulse will be checked every so often. Then you will be taken to a hospital room.   You may continue to get fluids through the IV for awhile.   Some pain is normal. The catheter might be uncomfortable. Pain is usually not severe. If it is, ask for pain medicine.   Your urine may look bloody after a transurethral resection. This is normal.   If bleeding is heavy, a hospital caregiver may rinse out the bladder (irrigation) through the catheter.  PROGNOSIS   Transurethral resection is considered the best way to treat bladder tumors that are not too far along. For most people, the treatment is successful. Sometimes, though, more treatment is needed.   Bladder cancers can come back even after a successful procedure. Because of this, be sure to have a checkup with your caregiver every 3 to 6 months. If everything is OK for 3 years, you can reduce the checkups to once a year.  Foley Catheter Care, Adult A soft, flexible tube (Foley catheter) has been placed in your bladder. This may be  done to temporarily help with urine drainage after an operation or to relieve blockage from an enlarged prostate gland. HOME CARE INSTRUCTIONS  If you are going home with a Foley catheter in place, follow these instructions: Taking Care of the Catheter:  Keep the area where the catheter leaves your body clean.   Attach the catheter to the leg so there is no tension on the catheter.   Keep the drainage bag below the level of the bladder, but keep it OFF the floor.   Do not take long soaking baths. Your caregiver will give instructions about showering.   Wash your hands before touching ANYTHING related to the catheter or bag.   Using mild soap and warm water on a washcloth:   Clean the area closest to the catheter insertion site using a circular motion around the catheter.   Clean the catheter itself by wiping AWAY from the insertion site for several inches down the tube.   NEVER wipe upward as this could sweep bacteria up into the urethra (tube in your body that normally drains the bladder) and cause infection.  Taking Care of the Drainage Bags:  Two drainage bags will be taken home: a large overnight drainage bag, and a smaller leg bag which fits underneath clothing.   It is okay to wear the overnight bag at any time, but NEVER wear the smaller leg bag at night.   Keep the drainage bag well below  the level of your bladder. This prevents backflow of urine into the bladder and allows the urine to drain freely.   Anchor the tubing to your leg to prevent pulling or tension on the catheter. Use tape or a leg strap provided by the hospital.   Empty the drainage bag when it is  to  full. Wash your hands before and after touching the bag.   Periodically check the tubing for kinks to make sure there is no pressure on the tubing which could restrict the flow of urine.  Changing the Drainage Bags:  Cleanse both ends of the clean bag with alcohol before changing.   Pinch off the rubber  catheter to avoid urine spillage during the disconnection.   Disconnect the dirty bag and connect the clean one.   Empty the dirty bag carefully to avoid a urine spill.   Attach the new bag to the leg with tape or a leg strap.  Cleaning the Drainage Bags:  Whenever a drainage bag is disconnected, it must be cleaned quickly so it is ready for the next use.   Wash the bag in warm, soapy water.   Rinse the bag thoroughly with warm water.   Soak the bag for 30 minutes in a solution of white vinegar and water (1 cup vinegar to 1 quart warm water).   Rinse with warm water.  SEEK MEDICAL CARE IF:   Some pain develops in the kidney (lower back) area.   The urine is cloudy or smells bad.   There is some blood in the urine.   The catheter becomes clogged and/or there is no urine drainage.  SEEK IMMEDIATE MEDICAL CARE IF:   You have moderate or severe pain in the kidney region.   You start to throw up (vomit).   Blood fills the tube.   Worsening belly (abdominal) pain develops.   You have a fever.  MAKE SURE YOU:   Understand these instructions.   Will watch your condition.   Will get help right away if you are not doing well or get worse.  Document Released: 06/14/2005 Document Revised: 06/03/2011 Document Reviewed: 12/09/2006 ExitCare Patient Information 2012 [place], [facility] Post Anesthesia Home Care Instructions  Activity: Get plenty of rest for the remainder of the day. A responsible adult should stay with you for 24 hours following the procedure.  For the next 24 hours, DO NOT: -Drive a car -Advertising copywriter -Drink alcoholic beverages -Take any medication unless instructed by your physician -Make any legal decisions or sign important papers.  Meals: Start with liquid foods such as gelatin or soup. Progress to regular foods as tolerated. Avoid greasy, spicy, heavy foods. If nausea and/or vomiting occur, drink only clear liquids until the nausea and/or vomiting  subsides. Call your physician if vomiting continues.  Special Instructions/Symptoms: Your throat may feel dry or sore from the anesthesia or the breathing tube placed in your throat during surgery. If this causes discomfort, gargle with warm salt water. The discomfort should disappear within 24 hours.  Marland Kitchen

## 2011-12-01 NOTE — Op Note (Signed)
NAME:  Drew Harrington, BOLDMAN NO.:  [id number]  MEDICAL RECORD NO.:  [id number]  LOCATION:  [facility]                           FACILITY:  [facility]  PHYSICIAN:  Jerilee Field, MD   DATE OF BIRTH:  [birth year]  DATE OF PROCEDURE: DATE OF DISCHARGE:  08/25/2011                              OPERATIVE REPORT   PREOPERATIVE DIAGNOSIS:  Bladder cancer.  POSTOPERATIVE DIAGNOSES:  Bladder cancer and urethral stricture.  OPERATION PROCEDURE:  Exam under anesthesia with dilation of urethral stricture and transurethral resection of bladder tumor.  SURGEON:  Jerilee Field, MD  ANESTHESIA:  General.  INDICATION FOR PROCEDURE:  Ms. Lina is a 66 year old male who presents with gross hematuria.  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  I discussed with the patient the nature, risks, and benefits of reresection to ensure there was no residual T1 disease and to ensure no upstaging the T2 disease.  We discussed the nature, risks, benefits, and alternatives to transurethral resection of bladder tumor and reresection of his prior biopsy site including alternatives such as nontreatment and continued surveillance. All questions answered and he elected to proceed.  FINDINGS:  On exam under anesthesia, the patient had a normal penis and testicles without mass.  The circumcision was well healed.  Exam of the abdomen reveals no masses.  On bimanual exam, there are no masses in the abdomen or bladder palpable and the prostate was palpably normal without any hard area, nodule, or enlargement.  On cystoscopy, there was a stricture of the urethra at the junction between the bulb and penile urethra.  Initially, the 22-French cystoscope was used to dilate the area, but the 26-French resectoscope would not advance and the stricture was dilated with sounds to 28- [place].  There were no other urethral abnormalities.  The prostate was not obstructive.  On exam of  the bladder, the prior left resection site was normal and well healing apart from some finger-like mucosa at the edge of the prior resection ?Marland Kitchen  This was from healing or inflammation or residual tumor.  All of this was reresected.  Also at the trigone, there was a neovascularity and this was biopsied.  The bladder was then examined otherwise in its entirety and there were no other findings.  Prior resection site was reresected despite discussing obturator reflex with anesthesia and the patient is still at obturator reflex which made the reresection difficult to get proper depth.  After all biopsies and resection, the ureteral orifices were noted to be normal and had good efflux.  Efflux was clear from both ureteral orifices.  DESCRIPTION OF PROCEDURE:  After consent was obtained, the patient was brought to the operating room.  A time-out was performed to confirm the patient and procedure.  After adequate anesthesia, he was placed in lithotomy position and a time-out performed.  He was then prepped and draped in the usual sterile fashion.  An exam under anesthesia was performed with sterile gloves in previous findings.  A B&O suppository was placed.  Cystoscope was advanced per urethra.  The cystoscope was used to dilate the urethral stricture initially with complete exam of the bladder.  The cystoscope was then  removed, but the resectoscope would not pass.  Therefore, the urethra was dilated to 28-French and then the 26-French resectoscope was advanced prior.  Tumor site on the left bladder wall was reresected, but again the patient had significant obturator reflex despite discussing this with Anesthesia.  They then paralyzed further and after waiting for the patient to settle, I tried to resect again, but the patient had another obturator kick.  Therefore, reresection was difficult to get the proper depth.  I was able to get some muscular samples.  I was able to fulgurate the entire  area and any abnormalities.  There was some area of neovascularity extending down from the tumor toward the trigone and bladder neck and this was biopsied with one pass of the loop on the trigone.  The area was fulgurated, it was sent separately as trigone biopsy.  The bladder was inspected again, noted to have excellent hemostasis.  The ureteral orifices again were noted to be normal, effluxing clear urine.  The scope was then removed. A 20-French Foley was placed, left to gravity drainage, draining clear urine.  ESTIMATED BLOOD LOSS:  Minimal.  DRAINS:  Foley catheter.  SPECIMENS: 1. Left bladder tumor, prior resection site. 2. Trigone.  SPECIMENS:  To Path.  DISPOSITION:  Stable to PACU.          ______________________________ Jerilee Field, MD     ME/MEDQ  D:  11/30/2011  T:  11/30/2011  Job:  [id number]

## 2011-12-06 ENCOUNTER — Encounter (HOSPITAL_BASED_OUTPATIENT_CLINIC_OR_DEPARTMENT_OTHER): Payer: Self-pay | Admitting: Urology

## 2011-12-15 ENCOUNTER — Other Ambulatory Visit: Payer: Self-pay | Admitting: *Deleted

## 2011-12-15 DIAGNOSIS — R319 Hematuria, unspecified: Secondary | ICD-10-CM

## 2011-12-15 NOTE — Telephone Encounter (Signed)
Patient request refill on medication clonidine patches. LOV 08/23/2011 :? OK

## 2011-12-16 MED ORDER — CLONIDINE HCL 0.3 MG/24HR TD PTWK: 1.0000 | MEDICATED_PATCH | TRANSDERMAL | Status: DC

## 2011-12-16 NOTE — Telephone Encounter (Signed)
Ok x 11 

## 2011-12-16 NOTE — Telephone Encounter (Signed)
Rx called to pharmacy. catapress patch

## 2012-09-04 ENCOUNTER — Telehealth: Payer: Self-pay | Admitting: Internal Medicine

## 2012-09-04 NOTE — Telephone Encounter (Signed)
For a patient with Erectile dysfunction, inability to attain an erection,  this can help. Generally I will prescribe oral medication and do not usually recommend vacumn device

## 2012-09-04 NOTE — Telephone Encounter (Signed)
Pt informed of MD's advisement. 

## 2012-09-04 NOTE — Telephone Encounter (Signed)
Patient Information:  Caller Name: Drew Harrington  Phone: [phone]  Patient: Drew Harrington, Drew Harrington  Gender: Male  DOB: [birth year]  Age: 67 Years  PCP: Drew Harrington (Adults only)  Office Follow Up:  Does the office need to follow up with this patient?: Yes  Instructions For The Office: Patient requests MD input on whether or not he should get the pump.  Thank you.   Symptoms  Reason For Call & Symptoms: Caller states he received information packet in the mail about Pos-T-Vac.  He called them and information regarding this will be sent to this office for review.  Denies new symptoms; just wanted to get MD feedback on the device; "If he don't like it we can forget all about it."  Reviewed Health History In EMR: Yes  Reviewed Medications In EMR: Yes  Reviewed Allergies In EMR: Yes  Reviewed Surgeries / Procedures: Yes  Date of Onset of Symptoms: Unknown  Guideline(s) Used:  No Protocol Available - Information Only  Disposition Per Guideline:   Discuss with PCP and Callback by Nurse Today  Reason For Disposition Reached:   Nursing judgment  Advice Given:  Call Back If:  New symptoms develop

## 2012-09-19 ENCOUNTER — Telehealth: Payer: Self-pay

## 2012-09-19 NOTE — Telephone Encounter (Signed)
L/m for pt to return call

## 2012-09-19 NOTE — Telephone Encounter (Signed)
Message copied by Noreene Larsson on Tue Sep 19, 2012  2:47 PM ------      Message from: Jacques Navy      Created: Sun Sep 17, 2012  1:26 PM       Re: order for Pos-T-vac: no office visit since Feb '13. No record of any trial of oral medication for ED which is required before being eligible for Pos-T-vac.            If he has tried and failed medication (viagra, levitra, cilias) need to know which medications he was prescribed. If he has not tried medication ok for Rx for viagra 50 mg #6 to use as needed.  ------

## 2012-12-28 ENCOUNTER — Other Ambulatory Visit: Payer: Self-pay

## 2012-12-28 DIAGNOSIS — R319 Hematuria, unspecified: Secondary | ICD-10-CM

## 2012-12-28 MED ORDER — CLONIDINE HCL 0.3 MG/24HR TD PTWK: 1.0000 | MEDICATED_PATCH | TRANSDERMAL | Status: DC

## 2013-01-10 ENCOUNTER — Other Ambulatory Visit (INDEPENDENT_AMBULATORY_CARE_PROVIDER_SITE_OTHER): Payer: Medicare Other

## 2013-01-10 ENCOUNTER — Encounter: Payer: Self-pay | Admitting: Internal Medicine

## 2013-01-10 ENCOUNTER — Ambulatory Visit (INDEPENDENT_AMBULATORY_CARE_PROVIDER_SITE_OTHER): Payer: Medicare Other | Admitting: Internal Medicine

## 2013-01-10 VITALS — BP 180/110 | HR 85 | Temp 98.1°F | Ht 69.0 in | Wt 200.8 lb

## 2013-01-10 DIAGNOSIS — F411 Generalized anxiety disorder: Secondary | ICD-10-CM

## 2013-01-10 DIAGNOSIS — R7309 Other abnormal glucose: Secondary | ICD-10-CM

## 2013-01-10 DIAGNOSIS — Z0000 Encounter for general adult medical examination without abnormal findings: Secondary | ICD-10-CM

## 2013-01-10 DIAGNOSIS — I10 Essential (primary) hypertension: Secondary | ICD-10-CM

## 2013-01-10 DIAGNOSIS — E785 Hyperlipidemia, unspecified: Secondary | ICD-10-CM

## 2013-01-10 LAB — HEPATIC FUNCTION PANEL
Albumin: 3.9 g/dL (ref 3.5–5.2)
Alkaline Phosphatase: 115 U/L (ref 39–117)
Bilirubin, Direct: 0.1 mg/dL (ref 0.0–0.3)
Total Protein: 8 g/dL (ref 6.0–8.3)

## 2013-01-10 LAB — LIPID PANEL
HDL: 39.6 mg/dL (ref >39.00)
Total CHOL/HDL Ratio: 6
VLDL: 24.6 mg/dL (ref 0.0–40.0)

## 2013-01-10 LAB — HEMOGLOBIN A1C: Hgb A1c MFr Bld: 7.3 % — ABNORMAL HIGH (ref 4.6–6.5)

## 2013-01-10 NOTE — Patient Instructions (Addendum)
Good to see you. Thanks for coming in.  Your exam is good except for a little gain in weight and your blood pressure is too high. Please continue the clonidine patch and restart the atenolol once a day. The atenolol will help blood pressure, make the heart more efficient and may help with the panic attacks.   Please report back blood pressure readings - you can use MyChart for this. You can also let me know how it is going with the panic attacks.  Lab today and results will be sent via MyChart and in the note I send you.

## 2013-01-10 NOTE — Progress Notes (Signed)
Subjective:    Patient ID: Drew Harrington, male    DOB: 03-01-1946, 67 y.o.   MRN: [id number]  HPI The patient is here for annual Medicare wellness examination and management of other chronic and acute problems.   Patient reports he has been feeling good. Interval - he is followed by urology, Dr. Mena Goes, for bladder cancer and BPH. He did have TURBT and is for cystoscopy.  He is having BCG treatments.   He has continued on clonidine but stopped the Atenolol. He reports that his blood pressure runs 140-160.  The risk factors are reflected in the social history.  The roster of all physicians providing medical care to patient - is listed in the Snapshot section of the chart.  Activities of daily living:  The patient is 100% inedpendent in all ADLs: dressing, toileting, feeding as well as independent mobility. 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   Home safety : The patient has smoke detectors in the home. No falls.They wear seatbelts. No firearms at home. There is no violence in the home.   There is no risks for hepatitis, STDs or HIV. There is no   history of blood transfusion. They have no travel history to infectious disease endemic areas of the world.  The patient has not seen their dentist in the last six month - he has full dentures. They have seen their eye doctor in the last year. They deny any hearing difficulty and have not had audiologic testing in the last year.  They do not  have excessive sun exposure.   Discussed the need for sun protection: hats, long sleeves and use of sunscreen if there is significant sun exposure.   Diet: the importance of a healthy diet is discussed. They do have a healthy diet.  The patient has a regular exercise program: walking , 20 min duration, 3 days per week.  The benefits of regular aerobic exercise were discussed.  Depression screen: there are no signs or vegative symptoms of depression- irritability, change in appetite, anhedonia,  sadness/tearfullness.  Cognitive assessment: the patient manages all their financial and personal affairs and is actively engaged.  The following portions of the patient's history were reviewed and updated as appropriate: allergies, current medications, past family history, past medical history,  past surgical history, past social history  and problem list.  Vision, hearing, body mass index were assessed and reviewed.   During the course of the visit the patient was educated and counseled about appropriate screening and preventive services including : fall prevention , diabetes screening, nutrition counseling, colorectal cancer screening, and recommended immunizations.  Past Medical History  Diagnosis Date  . Unspecified hemorrhoids without mention of complication   . Benign neoplasm of colon   . Psychosexual dysfunction with inhibited sexual excitement   . Insomnia, unspecified   . Allergic rhinitis   . HTN (hypertension)   . Borderline diabetes   . Bladder cancer    Past Surgical History  Procedure Laterality Date  . Cataract extraction w/ intraocular lens  implant, bilateral    . Transurethral resection of bladder tumor  10/08/2011    Procedure: CIRCUMCISION AND TRANSURETHRAL RESECTION OF BLADDER TUMOR (TURBT);  Surgeon: Antony Haste, MD;  Location: [facility] [place];  Service: Urology;  Laterality: N/A;  . Transthoracic echocardiogram  04-06-2006    LVSF NORMAL/  EF 60%/ AORTIC ROOT AT UPPER LIMITS OF NORMAL SIZE  . Transurethral resection of bladder tumor  11/30/2011  Procedure: TRANSURETHRAL RESECTION OF BLADDER TUMOR (TURBT);  Surgeon: Antony Haste, MD;  Location: [facility];  Service: Urology;  Laterality: N/A;   Family History  Problem Relation Age of Onset  . Lung disease Father   . Gout Other   . Heart disease Other   . Hypertension Other    History   Social History  . Marital Status: Married    Spouse Name: N/A     Number of Children: N/A  . Years of Education: N/A   Occupational History  . 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 Smoking status: Former Games developer  . Smokeless tobacco: Never Used  . Alcohol Use: No  . Drug Use: No  . Sexually Active: Not on file   Other Topics Concern  . Not on file   Social History Narrative   [employer]   Trained as orthopedic tech   Married-divorced; remarried '78   2 sons - '68, '79      Review of Systems Constitutional:  Negative for fever, chills, activity change and expected weight change.  HEENT:  Negative for hearing loss, ear pain, congestion, neck stiffness and postnasal drip. Negative for sore throat or swallowing problems. Negative for dental complaints.   Eyes: Negative for vision loss or change in visual acuity.  Respiratory: Negative for chest tightness and wheezing. Negative for DOE.   Cardiovascular: Negative for chest pain or palpitations. No decreased exercise tolerance Gastrointestinal: No change in bowel habit. No bloating or gas. No reflux or indigestion Genitourinary: Negative for urgency, frequency, flank pain and difficulty urinating.  Musculoskeletal: Negative for myalgias, back pain, arthralgias and gait problem.  Neurological: Negative for dizziness, tremors, weakness and headaches.  Hematological: Negative for adenopathy.  Psychiatric/Behavioral: Negative for behavioral problems and dysphoric mood.       Objective:   Physical Exam Filed Vitals:   01/10/13 0940  BP: 180/110  Pulse: 85  Temp: 98.1 F (36.7 C)   Wt Readings from Last 3 Encounters:  01/10/13 200 lb 12.8 oz (91.082 kg)  11/24/11 190 lb (86.183 kg)  11/24/11 190 lb (86.183 kg)   Gen'l: Well nourished well developed, overweight AA male in no acute distress  HEENT: Head: Normocephalic and atraumatic. Right Ear: External ear normal. EAC w/ cerumen. Left Ear: External ear normal.  EAC w/ cerumen. Nose: Nose normal. Mouth/Throat: Oropharynx  is clear and moist. Dentition -full dentures. No buccal lesions. Posterior pharynx clear. Eyes: Conjunctivae and sclera clear. EOM intact. Pupils are equal, round, and reactive to light. Arcus senilis is noted bilaterally.Right eye exhibits no discharge. Left eye exhibits no discharge. Neck: Normal range of motion. Neck supple. No JVD present. No tracheal deviation present. No thyromegaly present.  Cardiovascular: Normal rate, regular rhythm, no gallop, no friction rub, no murmur heard.      Quiet precordium. 2+ radial and DP pulses . No carotid bruits Pulmonary/Chest: Effort normal. No respiratory distress or increased WOB, no wheezes, no rales. No chest wall deformity or CVAT. Abdomen: Soft. Bowel sounds are normal in all quadrants. He exhibits no distension, no tenderness, no rebound or guarding, No heptosplenomegaly  Genitourinary:  deferred to GU Musculoskeletal: Normal range of motion. He exhibits no edema and no tenderness.       Small and large joints without redness, synovial thickening or deformity. Full range of motion preserved about all small, median and large joints.  Lymphadenopathy:    He has no cervical or supraclavicular adenopathy.  Neurological: He is alert and oriented to person, place, and time. CN II-XII intact. DTRs 2+ and symmetrical biceps, radial and patellar tendons. Cerebellar function normal with no tremor, rigidity, normal gait and station.  Skin: Skin is warm and dry. No rash noted. No erythema. Tatoo posterior cervical region Psychiatric: He has a normal mood and affect. His behavior is normal. Thought content normal.   Recent Results (from the past 2160 hour(s))  HEMOGLOBIN A1C     Status: Abnormal   Collection Time    01/10/13 10:50 AM      Result Value Range   Hemoglobin A1C 7.3 (*) 4.6 - 6.5 %   Comment: Glycemic Control Guidelines for People with Diabetes:Non Diabetic:  <6%Goal of Therapy: <7%Additional Action Suggested:  >8%   LIPID PANEL     Status:  Abnormal   Collection Time    01/10/13 10:50 AM      Result Value Range   Cholesterol 221 (*) 0 - 200 mg/dL   Comment: ATP III Classification       Desirable:  < 200 mg/dL               Borderline High:  200 - 239 mg/dL          High:  > = 409 mg/dL   Triglycerides 811.9  0.0 - 149.0 mg/dL   Comment: Normal:  <147 mg/dLBorderline High:  150 - 199 mg/dL   HDL 82.95  >62.13 mg/dL   VLDL 08.6  0.0 - 57.8 mg/dL   Total CHOL/HDL Ratio 6     Comment:                Men          Women1/2 Average Risk     3.4          3.3Average Risk          5.0          4.42X Average Risk          9.6          7.13X Average Risk          15.0          11.0                      HEPATIC FUNCTION PANEL     Status: None   Collection Time    01/10/13 10:50 AM      Result Value Range   Total Bilirubin 0.5  0.3 - 1.2 mg/dL   Bilirubin, Direct 0.1  0.0 - 0.3 mg/dL   Alkaline Phosphatase 115  39 - 117 U/L   AST 19  0 - 37 U/L   ALT 30  0 - 53 U/L   Total Protein 8.0  6.0 - 8.3 g/dL   Albumin 3.9  3.5 - 5.2 g/dL  LDL CHOLESTEROL, DIRECT     Status: None   Collection Time    01/10/13 10:50 AM      Result Value Range   Direct LDL 173.8     Comment: Optimal:  <100 mg/dLNear or Above Optimal:  100-129 mg/dLBorderline High:  130-159 mg/dLHigh:  160-189 mg/dLVery High:  >190 mg/dL         Assessment & Plan:

## 2013-01-11 DIAGNOSIS — Z0000 Encounter for general adult medical examination without abnormal findings: Secondary | ICD-10-CM | POA: Insufficient documentation

## 2013-01-11 NOTE — Assessment & Plan Note (Signed)
Poorly controlled blood pressure on current medication. He had taken himself off of atenolol.  Plan Continue clonidine patch  Resume use of atenolol  May need RAS product

## 2013-01-11 NOTE — Assessment & Plan Note (Signed)
Drew Harrington reports that he has regular panic attacks where he will need to interrupt his work until they pass.  Plan He is to resume atenolol  For persistent problems will add SSRI for long term care and xanax on a prn basis.

## 2013-01-11 NOTE — Assessment & Plan Note (Signed)
Lab reveals an LDL that is too high with a goal of 100 or less  Plan With metabolic syndrome: overweight, hypertension, diabetes and hyperlipidemia he will need medical therapy with a statin drug.

## 2013-01-11 NOTE — Assessment & Plan Note (Signed)
Interval history significant for bladder cancer and BPH being actively managed by Dr. Mena Goes. Physical exam is notable for weight gain. Lab results are notable for elevated A1C, elevated cholesterol. He is current for colorectal cancer screening. Immunizations - he is provided Rx to be brought up to date.  In summary A pleasant man who has had progression of metabolic disease. He will return to discuss initiation of treatment for diabetes and cholesterol manage along with follow up on blood pressure control.

## 2013-01-11 NOTE — Assessment & Plan Note (Signed)
Lab Results  Component Value Date   HGBA1C 7.3* 01/10/2013    Elevated A1C makes for a diagnosis of Diabetes. At close to 7% he does not need medication at this point.  Plan Diet management: NO SUGAR, low carb diet  Continued and increased exercise on a daily basis.  Repeat A1C in 3 months

## 2013-01-15 ENCOUNTER — Telehealth: Payer: Self-pay

## 2013-01-15 DIAGNOSIS — E785 Hyperlipidemia, unspecified: Secondary | ICD-10-CM

## 2013-01-15 MED ORDER — LOVASTATIN 40 MG PO TABS: 40.0000 mg | ORAL_TABLET | Freq: Every day | ORAL | Status: DC

## 2013-01-15 MED ORDER — SERTRALINE HCL 50 MG PO TABS: 50.0000 mg | ORAL_TABLET | Freq: Every day | ORAL | Status: DC

## 2013-01-15 NOTE — Telephone Encounter (Signed)
Phone call from patient stating he recently saw you. He was to begin 2 new medications that have not been sent to his pharmacy, [facility] in [place] on [address]. Please advise on what medciations should be sent to his pharmacy. Thank you.

## 2013-01-15 NOTE — Telephone Encounter (Signed)
My oversight. Rx for sertraline 50 mg once a day for anxiety and lovastatin 40 mg once a day for cholesterol sent to pharmacy.  He will need cholesterol panel in 6 weeks. Order entered

## 2013-01-15 NOTE — Telephone Encounter (Signed)
Phone call to patient letting him know prescriptions were sent to his pharmacy. He was advised he will need his cholesterol checked  In 6 weeks, the order has been placed, and he was given the lab hours

## 2013-01-26 ENCOUNTER — Other Ambulatory Visit: Payer: Self-pay | Admitting: Internal Medicine

## 2013-02-21 ENCOUNTER — Other Ambulatory Visit: Payer: Self-pay | Admitting: Internal Medicine

## 2013-03-28 ENCOUNTER — Telehealth: Payer: Self-pay | Admitting: Internal Medicine

## 2013-03-28 DIAGNOSIS — R251 Tremor, unspecified: Secondary | ICD-10-CM

## 2013-03-28 NOTE — Telephone Encounter (Signed)
Referral order to neurology entered

## 2013-03-28 NOTE — Telephone Encounter (Signed)
Pt request referral for Neurologist due to his hand is very shaky and the medication that Dr. Debby Bud gave him is not helping. Please advise.

## 2013-03-29 NOTE — Telephone Encounter (Signed)
Patient notified order has been placed.

## 2013-04-01 ENCOUNTER — Other Ambulatory Visit: Payer: Self-pay | Admitting: Internal Medicine

## 2013-04-05 ENCOUNTER — Ambulatory Visit (INDEPENDENT_AMBULATORY_CARE_PROVIDER_SITE_OTHER): Payer: Medicare Other | Admitting: Neurology

## 2013-04-05 ENCOUNTER — Encounter: Payer: Self-pay | Admitting: Neurology

## 2013-04-05 VITALS — BP 198/112 | HR 76 | Temp 98.5°F | Ht 68.25 in | Wt 194.0 lb

## 2013-04-05 DIAGNOSIS — I10 Essential (primary) hypertension: Secondary | ICD-10-CM

## 2013-04-05 DIAGNOSIS — R259 Unspecified abnormal involuntary movements: Secondary | ICD-10-CM

## 2013-04-05 DIAGNOSIS — G4733 Obstructive sleep apnea (adult) (pediatric): Secondary | ICD-10-CM

## 2013-04-05 DIAGNOSIS — R251 Tremor, unspecified: Secondary | ICD-10-CM

## 2013-04-05 NOTE — Progress Notes (Signed)
Subjective:    Patient ID: Drew Harrington is a 67 y.o. male.  HPI  Drew Foley, MD, PhD [facility] [address], [address] [address] [place], [place] [address]  Dear Dr. Debby Bud,   I saw your patient, Drew Harrington, upon your kind request in my neurologic clinic today for initial consultation of his hand tremor. The patient is accompanied by his wife today. As you know, Drew Harrington is a very pleasant 67 year old right-handed gentleman with an underlying medical history of insomnia, allergic rhinitis, hypertension, borderline diabetes, bladder cancer, who has been experiencing an intermittent upper extremity tremor for the past 3 years. He quit smoking and drinking 20 years ago. His tremor affects mostly his hands and is with writing, signing, feeding himself or with holding something. He was tried on Zoloft, but only tried it for week and stopped it on his own. 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 He feels, his tremor is worse with anxiety and pressure and stress. He sleeps poorly and has EDS. His BP is rather high today. He goes to bed at 2-3 AM and sleeps till 11 AM, but wakes up in the middle of the night, he snores and makes gasping sounds and has breathing pauses. He currently denies blurry vision/CP/N/V/SOB, but is noted to be sweaty. He has no FHx of tremor. He has had increasing blood pressure values. He was recently restarted on his atenolol. He takes up to 4 pills a day. His prescriptions has taken one pill twice daily.  His Past Medical History Is Significant For: Past Medical History  Diagnosis Date  . Unspecified hemorrhoids without mention of complication   . Benign neoplasm of colon   . Psychosexual dysfunction with inhibited sexual excitement   . Insomnia, unspecified   . Allergic rhinitis   . HTN (hypertension)   . Borderline diabetes   . Bladder cancer     His Past Surgical History Is Significant  For: Past Surgical History  Procedure Laterality Date  . Cataract extraction w/ intraocular lens  implant, bilateral    . Transurethral resection of bladder tumor  10/08/2011    Procedure: CIRCUMCISION AND TRANSURETHRAL RESECTION OF BLADDER TUMOR (TURBT);  Surgeon: Antony Haste, MD;  Location: [facility];  Service: Urology;  Laterality: N/A;  . Transthoracic echocardiogram  04-06-2006    LVSF NORMAL/  EF 60%/ AORTIC ROOT AT UPPER LIMITS OF NORMAL SIZE  . Transurethral resection of bladder tumor  11/30/2011    Procedure: TRANSURETHRAL RESECTION OF BLADDER TUMOR (TURBT);  Surgeon: Antony Haste, MD;  Location: [facility];  Service: Urology;  Laterality: N/A;    His Family History Is Significant For: Family History  Problem Relation Age of Onset  . Lung disease Father   . Gout Other   . Heart disease Other   . Hypertension Other     His Social History Is Significant For: History   Social History  . Marital Status: Married    Spouse Name: N/A    Number of Children: N/A  . Years of Education: N/A   Occupational History  . Part Time Ortho Tech [employer]   Social History Main Topics  . Smoking status: Former Games developer  . Smokeless tobacco: Never Used  . Alcohol Use: No  . Drug Use: No  . Sexual Activity: Yes   Other Topics Concern  . None   Social History Narrative  [employer]   Trained as orthopedic tech   Married-divorced; remarried '78   2 sons - '68, '79    His Allergies Are:  Allergies  Allergen Reactions  . Sildenafil Other (See Comments)    REACTION: Headache  :   His Current Medications Are:  Outpatient Encounter Prescriptions as of 04/05/2013  Medication Sig Dispense Refill  . atenolol (TENORMIN) 25 MG tablet       . cloNIDine (CATAPRES - DOSED IN MG/24 HR) 0.3 mg/24hr patch APPLY ONE PATCH ONTO THE SKIN ONCE WEEKLY  4 patch  0  . lovastatin (MEVACOR) 40 MG tablet Take 1 tablet (40 mg total) by mouth at bedtime.   30 tablet  5  . Multiple Vitamins-Minerals (CENTRUM SILVER PO) Take by mouth.        . sertraline (ZOLOFT) 50 MG tablet Take 1 tablet (50 mg total) by mouth daily.  30 tablet  3   Facility-Administered Encounter Medications as of 04/05/2013  Medication Dose Route Frequency Provider Last Rate Last Dose  . mitomycin (MUTAMYCIN) chemo injection 40 mg  40 mg Bladder Instillation Once Antony Haste, MD       Review of Systems:  Out of a complete 14 point review of systems, all are reviewed and negative with the exception of these symptoms as listed below:  Review of Systems  Neurological: Positive for tremors.  Psychiatric/Behavioral:       Depression, Anxiety    Objective:  Neurologic Exam  Physical Exam Physical Examination:   Filed Vitals:   04/05/13 1006  BP: 198/112  Pulse: 76  Temp: 98.5 F (36.9 C)   Recheck BP is 194/118, pulse of 74.   General Examination: The patient is a very pleasant 67 y.o. male in no acute distress. He appears well-developed and well-nourished and well groomed. He is overweight. He is sweating. Again, he denies any acute chest pain, shortness of breath, blurry vision. He is mildly anxious appearing.   HEENT: Normocephalic, atraumatic, pupils are equal, round and reactive to light and accommodation. Funduscopic exam is normal with sharp disc margins noted. Extraocular tracking is good without limitation to gaze excursion or nystagmus noted. Normal smooth pursuit is noted. Hearing is grossly intact. Tympanic membranes are clear bilaterally. Face is symmetric with normal facial animation and normal facial sensation. Speech is clear with no dysarthria noted. There is no hypophonia. There is no lip, neck/head, jaw or voice tremor. Neck is supple with full range of passive and active motion. There are no carotid bruits on auscultation. Oropharynx exam reveals: mild mouth dryness, dentures in upper and lower, and moderate airway crowding, due to  redundant soft palate, tonsillar size of 2+, and larger tongue. Mallampati is class II. Tongue protrudes centrally and palate elevates symmetrically. Tonsils are 2+ in size. Neck size is 16 inches.   Chest: Clear to auscultation without wheezing, rhonchi or crackles noted.  Heart: S1+S2+0, regular and normal without murmurs, rubs or gallops noted.   Abdomen: Soft, non-tender and non-distended with normal bowel sounds appreciated on auscultation.  Extremities: There is no pitting edema in the distal lower extremities bilaterally. Pedal pulses are intact.  Skin: Warm and dry without trophic changes noted. There are no varicose veins.  Musculoskeletal: exam reveals no obvious joint deformities, tenderness or joint swelling or erythema.   Neurologically:  Mental status: The patient is awake, alert and oriented in all 4 spheres. His memory, attention, language and knowledge are appropriate. There is no aphasia, agnosia, apraxia or anomia. Speech  is clear with normal prosody and enunciation. Thought process is linear. Mood is congruent and affect is normal.  Cranial nerves are as described above under HEENT exam. In addition, shoulder shrug is normal with equal shoulder height noted.  Motor exam: Normal bulk, strength and tone is noted. There is no drift, resting tremor or rebound. He has a subtle b/l UE postural and action tremor. On Archimedes spiral drawing he has mild b/l tremulousness and his handwriting is mild to moderately tremulous, but legible and not micrographic. Romberg is negative. Reflexes are 2+ throughout. Fine motor skills are intact with normal finger taps, normal hand movements, normal rapid alternating patting, normal foot taps and normal foot agility. There is no decrement in amplitude.  Cerebellar testing shows no dysmetria or intention tremor on finger to nose testing. Heel to shin is unremarkable bilaterally. There is no truncal or gait ataxia.  Sensory exam is intact to light  touch, pinprick, vibration, temperature sense and proprioception in the upper and lower extremities.  Gait, station and balance are unremarkable. No veering to one side is noted. No leaning to one side is noted. Posture is age-appropriate and stance is narrow based. No problems turning are noted. He turns en bloc. Tandem walk is unremarkable. Intact toe and heel stance is noted.               Assessment and Plan:   In summary, Drew Harrington is a very pleasant 67 y.o.-year old male with a history of  hypertension, obesity, borderline diabetes, who has had an intermittent tremor in his upper extremities for the past 3 years. He feels this is progressive. Upon examination, he has a mild upper extremity postural and action tremor. 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 This could be enhanced physiologic tremor as well, driven by anxiety. He was recently started on Zoloft but quit taking it on his own about a week later as he did not notice any improvement. I talked to him and his wife at length about this and encouraged him to restart taking Zoloft but try it for at least 6 weeks because it may take that long for him to feel a difference. Alternatively we can try him on an increased dose of atenolol or [place] down the Road. I do believe that his anxiety has a lot to do with his trembling. He certainly does not have any parkinsonian features at this time and I reassured her in that regard. His blood pressure is rather high today and I rechecked it about 40 minutes later and it was high. I would like for him to proceed to the next urgent care to reduce his blood pressure but also make an appointment with you for blood pressure management fairly soon. His history and physical exam are also highly concerning for obstructive sleep apnea (OSA). I explained the diagnosis of OSA, its prognosis and treatment options. We talked about medical treatments and non-pharmacological approaches. I explained in  particular the risks and ramifications of untreated moderate to severe OSA, especially with respect to developing cardiovascular disease down the Road, including congestive heart failure, difficult to treat hypertension, cardiac arrhythmias, or stroke. Even type 2 diabetes has in part been linked to untreated OSA. We talked about trying to maintain a healthy lifestyle in general, as well as the importance of weight control. I encouraged the patient to eat healthy, exercise daily and keep well hydrated, to keep a scheduled bedtime and wake time routine, to not skip any  meals and eat healthy snacks in between meals. Poor sleep can also drive his blood pressure, anxiety and also worsen his tremor. To that and, I recommended the following at this time: sleep study with potential positive airway pressure titration, restart Zoloft at the current dose, and proceed to urgent care for blood pressure reduction as well as subsequent followup with you for blood pressure management.  I explained the sleep test procedure to the patient and also outlined possible surgical and non-surgical treatment options of OSA, including the use of a custom-made dental device, upper airway surgical options, such as pillar implants, radiofrequency surgery, tongue base surgery, and UPPP. I also explained the CPAP treatment option to the patient, who indicated that he would be willing to try CPAP if the need arises. I explained the importance of being compliant with PAP treatment, not only for insurance purposes but primarily to improve His symptoms, and for the patient's long term health benefit, including to reduce His cardiovascular risks. I answered all their questions today and the patient and his wife were in agreement. I would like to see him back after the sleep study is completed and encouraged them to call with any interim questions, concerns, problems or updates.   Thank you very much for allowing me to participate in the care of  this nice patient. If I can be of any further assistance to you please do not hesitate to call me at [phone].  Sincerely,   Drew Foley, MD, PhD

## 2013-04-05 NOTE — Patient Instructions (Signed)
Please proceed to the nearest Urgent care for severe high blood pressure.  Please make an appt with your PCP regarding BP management.  Based on your symptoms and your exam I believe you are at risk for obstructive sleep apnea or OSA, and I think we should proceed with a sleep study to determine whether you do or do not have OSA and how severe it is. If you have more than mild OSA, I want you to consider treatment with CPAP. Please remember, the risks and ramifications of moderate to severe obstructive sleep apnea or OSA are: Cardiovascular disease, including congestive heart failure, stroke, difficult to control hypertension, arrhythmias, and even type 2 diabetes has been linked to untreated OSA. Sleep apnea causes disruption of sleep and sleep deprivation in most cases, which, in turn, can cause recurrent headaches, problems with memory, mood, concentration, focus, and vigilance. Most people with untreated sleep apnea report excessive daytime sleepiness, which can affect their ability to drive. Please do not drive if you feel sleepy.  I will see you back after your sleep study to go over the test results and where to go from there. We will call you after your sleep study and to set up an appointment at the time.   For your tremor, restart your anxiety medicine, sertraline. You have to try it for about 6 weeks to really tell a difference.

## 2013-04-09 ENCOUNTER — Encounter: Payer: Self-pay | Admitting: Internal Medicine

## 2013-04-11 ENCOUNTER — Encounter: Payer: Self-pay | Admitting: Internal Medicine

## 2013-04-17 ENCOUNTER — Encounter (HOSPITAL_COMMUNITY): Payer: Self-pay | Admitting: Emergency Medicine

## 2013-04-17 ENCOUNTER — Emergency Department (INDEPENDENT_AMBULATORY_CARE_PROVIDER_SITE_OTHER)
Admission: EM | Admit: 2013-04-17 | Discharge: 2013-04-17 | Disposition: A | Discharge status: Home / Self Care | Payer: Medicare Other | Source: Home / Self Care | Attending: Family Medicine | Admitting: Family Medicine

## 2013-04-17 ENCOUNTER — Emergency Department (INDEPENDENT_AMBULATORY_CARE_PROVIDER_SITE_OTHER): Payer: Medicare Other

## 2013-04-17 DIAGNOSIS — S139XXA Sprain of joints and ligaments of unspecified parts of neck, initial encounter: Secondary | ICD-10-CM

## 2013-04-17 DIAGNOSIS — S161XXA Strain of muscle, fascia and tendon at neck level, initial encounter: Secondary | ICD-10-CM

## 2013-04-17 DIAGNOSIS — M25559 Pain in unspecified hip: Secondary | ICD-10-CM

## 2013-04-17 DIAGNOSIS — M25552 Pain in left hip: Secondary | ICD-10-CM

## 2013-04-17 DIAGNOSIS — M545 Low back pain: Secondary | ICD-10-CM

## 2013-04-17 MED ORDER — METHYLPREDNISOLONE (PAK) 4 MG PO TABS: ORAL_TABLET | ORAL | Status: DC
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
## 2013-04-19 NOTE — ED Provider Notes (Signed)
Medical screening examination/treatment/procedure(s) were performed by resident physician or non-physician practitioner and as supervising physician I was immediately available for consultation/collaboration.   KINDL,JAMES DOUGLAS MD.   James D Kindl, MD 04/19/13 1613 

## 2013-04-25 ENCOUNTER — Ambulatory Visit (INDEPENDENT_AMBULATORY_CARE_PROVIDER_SITE_OTHER): Payer: Medicare Other

## 2013-04-25 DIAGNOSIS — G4733 Obstructive sleep apnea (adult) (pediatric): Secondary | ICD-10-CM

## 2013-04-25 DIAGNOSIS — I10 Essential (primary) hypertension: Secondary | ICD-10-CM

## 2013-05-03 ENCOUNTER — Other Ambulatory Visit: Payer: Self-pay | Admitting: Internal Medicine

## 2013-05-09 ENCOUNTER — Encounter: Payer: Self-pay | Admitting: *Deleted

## 2013-05-09 ENCOUNTER — Telehealth: Payer: Self-pay | Admitting: Neurology

## 2013-05-09 DIAGNOSIS — G4733 Obstructive sleep apnea (adult) (pediatric): Secondary | ICD-10-CM

## 2013-05-09 NOTE — Telephone Encounter (Signed)
Please call and notify patient that the recent sleep study confirmed the diagnosis of OSA. He did well with CPAP during the study with significant improvement of the respiratory events. Therefore, I would like start the patient on CPAP at home. I placed the order in the chart.   Arrange for CPAP set up at home through a DME company of patient's choice and fax/route report to PCP and referring MD (if other than PCP).   The patient will also need a follow up appointment with me in 6-8 weeks post set up that has to be scheduled; help the patient schedule this (in a follow-up slot).   Please re-enforce the importance of compliance with treatment and the need for us to monitor compliance data.   Once you have spoken to the patient and scheduled the return appointment, you may close this encounter, thanks,   Jolina Symonds, MD, PhD [facility] ([facility]    

## 2013-05-09 NOTE — Telephone Encounter (Signed)
Called patient to discuss sleep study results.  Discussed findings, recommendations and follow up care.  Patient understood well and all questions were answered.   Patient would like orders to go to [facility]   A copy of the sleep study interpretive report as well as a letter with info regarding contact info for the DME company, the importance of CPAP compliance, and the need to schedule a 6-8 week post CPAP follow up appointment info will be mailed to the patient's home.  Attempted to schedule this appt with patient however he didn't have a calendar for 2015 and asked if he could call us back to schedule this later on.

## 2013-05-10 ENCOUNTER — Encounter (INDEPENDENT_AMBULATORY_CARE_PROVIDER_SITE_OTHER): Payer: Self-pay

## 2013-05-10 ENCOUNTER — Encounter (INDEPENDENT_AMBULATORY_CARE_PROVIDER_SITE_OTHER): Payer: Self-pay | Admitting: Surgery

## 2013-05-10 ENCOUNTER — Ambulatory Visit (INDEPENDENT_AMBULATORY_CARE_PROVIDER_SITE_OTHER): Payer: Medicare Other | Admitting: Surgery

## 2013-05-10 VITALS — BP 136/80 | HR 76 | Temp 98.6°F | Resp 15 | Ht 69.0 in | Wt 191.4 lb

## 2013-05-10 DIAGNOSIS — K611 Rectal abscess: Secondary | ICD-10-CM

## 2013-05-10 DIAGNOSIS — K612 Anorectal abscess: Secondary | ICD-10-CM

## 2013-05-10 HISTORY — DX: Rectal abscess: K61.1

## 2013-05-10 MED ORDER — AMOXICILLIN-POT CLAVULANATE 875-125 MG PO TABS: 1.0000 | ORAL_TABLET | Freq: Two times a day (BID) | ORAL | Status: DC

## 2013-05-10 NOTE — Patient Instructions (Signed)
Change external gauze dressing as needed for drainage 2-3 times daily.  Begin tub soaks on Saturday and removed packing. Continue tub soaks 2-3 times daily and cover wound with dry gauze until healed.  Take Augmentin for 5 days as prescribed.  Velora Heckler, MD, [facility] Office: [phone]
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
## 2013-05-23 ENCOUNTER — Encounter (INDEPENDENT_AMBULATORY_CARE_PROVIDER_SITE_OTHER): Payer: Self-pay | Admitting: General Surgery

## 2013-05-23 ENCOUNTER — Ambulatory Visit (INDEPENDENT_AMBULATORY_CARE_PROVIDER_SITE_OTHER): Payer: Medicare Other | Admitting: General Surgery

## 2013-05-23 ENCOUNTER — Encounter (INDEPENDENT_AMBULATORY_CARE_PROVIDER_SITE_OTHER): Payer: Self-pay

## 2013-05-23 VITALS — BP 158/98 | HR 84 | Temp 98.9°F | Resp 15 | Ht 69.0 in | Wt 190.0 lb

## 2013-05-23 DIAGNOSIS — K612 Anorectal abscess: Secondary | ICD-10-CM

## 2013-05-23 DIAGNOSIS — K611 Rectal abscess: Secondary | ICD-10-CM

## 2013-05-23 HISTORY — PX: OTHER SURGICAL HISTORY: SHX169

## 2013-05-23 MED ORDER — METRONIDAZOLE 500 MG PO TABS: 500.0000 mg | ORAL_TABLET | Freq: Three times a day (TID) | ORAL | Status: DC

## 2013-05-23 NOTE — Progress Notes (Signed)
Drew Harrington is a 67 y.o. male who is here for a follow up visit regarding his perianal abscess. He reports a large amount of purulent drainage. He still having a lot of perianal pain. He is scheduled to see Dr. Leone Payor in December an is scheduled to have a colonoscopy in January. He denies any chronic diarrhea or rectal bleeding.  Objective: Filed Vitals:   05/23/13 1550  BP: 158/98  Pulse: 84  Temp: 98.9 F (37.2 C)  Resp: 15    General appearance: alert and cooperative GI: soft, non-tender; bowel sounds normal; no masses,  no organomegaly perianal: there is a1 cm incision in the left anterior perianal region This is draining a serous purulent fluid. The cavity was probed with a sterile Q-tip. It extended well into the perianal space towards the anus. This is most consistent with a Crohn's abscess.  Procedure: incision and drainage of perianal abscess Surgeon: Maisie Fus Assistant: Smithey After the risks and benefits were explained, verbal consent was obtained for above procedure  Anesthesia: lidocaine with epinephrine Diagnosis: perianal abscess Procedure: The area was prepped with an alcohol swab. Subcutaneous lidocaine was infused. The incision was then enlarged with a scalpel. Hemostasis was achieved using silver nitrate and direct pressure. A 4 x 4 sponge was packed into the area for hemostasis. A sterile dressing was applied.  Assessment and Plan: Drew Harrington is a 67 year old male who presents to the office with continuing drainage of a perirectal abscess. His physical exam is most consistent with Crohn's disease although he does not have any chronic diarrhea. He is scheduled to see Dr. Leone Payor in a couple weeks. I have recommended that he start doing more regular sitz bath several times a day. I will also put him on a 3 week course of Flagyl to see if we can help this area healed. I want to see him back in 2-3 weeks to make sure that it is healing  appropriately.    Vanita Panda, MD [facility], [place] [phone]

## 2013-05-23 NOTE — Patient Instructions (Signed)
Home Instructions Following Incision and Drainage of Perirectal Abscess  Wound care - A dressing has been applied to control any bleeding or drainage immediately after your procedure.  You may remove this dressing at your first bowel movement or tomorrow morning, whichever comes first.  There may be packing inside your wound as well that should be removed with the dressing.  You do not need to repack the area.  After the dressing is removed, clean the area gently with a mild soap and warm water and place a piece of 100% cotton over the area.  Change to cotton ever 1-3 hours while awake to keep the area clean and dry.   - Beginning tomorrow, sit in a tub of warm water for 15-20 minutes at least three times a day and after bowel movements.  This will help with healing, pain and discomfort. - A small amount of bleeding is to be expected.  If you notice an increase in the bleeding, place a large piece of cotton (about the size of a golf ball) next to the anal opening and sit on a hard surface for 15 minutes.  If the bleeding persists or if you are concerned, please call the office.  Do not sit on rubber rings.  Instead, sit on a soft pillow.    Diet -Eat a regular diet.  Avoid foods that may constipate you or give you diarrhea.  Drink 6-8 glasses of water a day and avoid seeds, nuts and popcorn until the area heals.  Medication -Take pain medication as directed.  Do not drive or operate machinery if you are taking a prescription pain medication.   - We recommend Extra Strength Tylenol for mild to moderate pain.  This can be taken as instructed on the bottle.   - If you are given a prescription for antibiotics, take as instructed by your doctor until the entire course is completed  Bowel Habits Avoid laxatives unless instructed by your doctor. Take a fiber supplement twice a day (Metamucil, FiberCon, Benefiber) Avoid excessive straining to have a bowel movement Do not go for more than 3 days without a  bowel movement.  Take a regular Fleet enema if you are constipated.  Call the office if unable to do this or no results.    Activity Resume activities as tolerated beginning tomorrow.  Avoid strenuous activities or sports for one week.    Call the office if you have any questions.  Call IMMEDIATELY if you should develop persistent heavy rectal bleeding, increase in pain, difficulty urinating or fever greater than 100 F.

## 2013-06-08 ENCOUNTER — Other Ambulatory Visit: Payer: Self-pay | Admitting: Internal Medicine

## 2013-06-12 ENCOUNTER — Encounter (INDEPENDENT_AMBULATORY_CARE_PROVIDER_SITE_OTHER): Payer: Self-pay | Admitting: General Surgery

## 2013-06-12 ENCOUNTER — Ambulatory Visit (INDEPENDENT_AMBULATORY_CARE_PROVIDER_SITE_OTHER): Payer: Medicare Other | Admitting: General Surgery

## 2013-06-12 VITALS — BP 162/102 | HR 76 | Temp 97.9°F | Resp 16 | Ht 69.0 in | Wt 190.0 lb

## 2013-06-12 DIAGNOSIS — K610 Anal abscess: Secondary | ICD-10-CM

## 2013-06-12 DIAGNOSIS — K612 Anorectal abscess: Secondary | ICD-10-CM

## 2013-06-12 NOTE — Progress Notes (Signed)
Drew Harrington is a 67 y.o. male who is here for a follow up visit regarding his perianal abscess. He states that he continues to have some drainage but the pain is much better.  He is scheduled to get his colonoscopy next week.  Objective: Filed Vitals:   06/12/13 1242  BP: 162/102  Pulse: 76  Temp: 97.9 F (36.6 C)  Resp: 16    General appearance: alert and cooperative GI: normal findings: soft, non-tender Anal: Abscess cavity appears to have decreased in size, purulent drainage is minimal, there is some boggy granulation tissue noted at the base, minimal active healing  Assessment and Plan: Drew Harrington Is a 67 year old male who is status post drainage of a perirectal abscess. This abscess appears to most consistent with perianal Crohn's disease. He is scheduled to get a colonoscopy next week. I will see him back in about a month to reevaluate his wound. I have asked him to continue sitz bath especially after bowel movements.    Vanita Panda, MD [facility], [place] [phone]

## 2013-06-12 NOTE — Patient Instructions (Signed)
Continue warm baths after bowel movements.

## 2013-06-18 ENCOUNTER — Ambulatory Visit (AMBULATORY_SURGERY_CENTER): Payer: Self-pay | Admitting: *Deleted

## 2013-06-18 ENCOUNTER — Encounter: Payer: Self-pay | Admitting: Internal Medicine

## 2013-06-18 VITALS — Ht 69.0 in | Wt 187.6 lb

## 2013-06-18 DIAGNOSIS — Z860100 Personal history of colon polyps, unspecified: Secondary | ICD-10-CM

## 2013-06-18 MED ORDER — NA SULFATE-K SULFATE-MG SULF 17.5-3.13-1.6 GM/177ML PO SOLN: 1.0000 | Freq: Once | ORAL | Status: DC

## 2013-06-18 NOTE — Progress Notes (Signed)
No allergies to eggs or soy. No problems with anesthesia.  

## 2013-07-03 ENCOUNTER — Encounter: Payer: Self-pay | Admitting: Neurology

## 2013-07-03 NOTE — Progress Notes (Signed)
Quick Note:  I reviewed the patient's CPAP compliance data from 05/16/2013 to 06/14/2013, which is a total of 30 days, during which time the patient used CPAP every day except for 3 days. The average usage for all days was 4 hours and 15 minutes. The percent used days greater than 4 hours was only 50 %, indicating fair compliance. The residual AHI was 3.4 per hour, indicating an adequate treatment pressure of 6 cwp with EPR off. It looks like he started off using CPAP fairly regularly with an average of about 6 hours but then slowly declined in the usage after the first 2 weeks of this monitoring time period. We will get in touch with his DME company regarding his compliance and possible problems he may be experiencing that prevents him from being adherent to therapy. I will review this data with the patient at the next office visit, provide feedback and additional troubleshooting if need be.  Star Age, MD, PhD [facility] ([facility]   ______

## 2013-07-04 ENCOUNTER — Ambulatory Visit (AMBULATORY_SURGERY_CENTER): Payer: Medicare Other | Admitting: Internal Medicine

## 2013-07-04 ENCOUNTER — Encounter: Payer: Self-pay | Admitting: Internal Medicine

## 2013-07-04 VITALS — BP 179/100 | HR 64 | Temp 96.8°F | Resp 16 | Ht 69.0 in | Wt 187.0 lb

## 2013-07-04 DIAGNOSIS — D126 Benign neoplasm of colon, unspecified: Secondary | ICD-10-CM

## 2013-07-04 DIAGNOSIS — K648 Other hemorrhoids: Secondary | ICD-10-CM

## 2013-07-04 DIAGNOSIS — Z86010 Personal history of colonic polyps: Secondary | ICD-10-CM

## 2013-07-04 MED ORDER — SODIUM CHLORIDE 0.9 % IV SOLN: 500.0000 mL | INTRAVENOUS | Status: DC

## 2013-07-04 NOTE — Progress Notes (Signed)
Called to room to assist during endoscopic procedure.  Patient ID and intended procedure confirmed with present staff. Received instructions for my participation in the procedure from the performing physician. ewm 

## 2013-07-04 NOTE — Op Note (Signed)
[place]  [address] [place], [id number]   COLONOSCOPY PROCEDURE REPORT  PATIENT: Drew Harrington, Drew Harrington  MR#: [id number] BIRTHDATE: [birth year] , 67  yrs. old GENDER: Male ENDOSCOPIST: Gatha Mayer, MD, [facility] PROCEDURE DATE:  07/04/2013 PROCEDURE:   Colonoscopy with biopsy and snare polypectomy First Screening Colonoscopy - Avg.  risk and is 50 yrs.  old or older - No.  Prior Negative Screening - Now for repeat screening. N/A  History of Adenoma - Now for follow-up colonoscopy & has been > or = to 3 yrs.  Yes hx of adenoma.  Has been 3 or more years since last colonoscopy.  Polyps Removed Today? Yes. ASA CLASS:   Class II INDICATIONS:Patient's personal history of adenomatous colon polyps.  MEDICATIONS: propofol (Diprivan) 200mg  IV, MAC sedation, administered by CRNA, and These medications were titrated to patient response per physician's verbal order  DESCRIPTION OF PROCEDURE:   After the risks benefits and alternatives of the procedure were thoroughly explained, informed consent was obtained.  A digital rectal exam revealed no abnormalities of the rectum, A digital rectal exam revealed no prostatic nodules, and A digital rectal exam revealed the prostate was not enlarged.   The LB [id number]  endoscope was introduced through the anus and advanced to the cecum, which was identified by both the appendix and ileocecal valve. No adverse events experienced.   The quality of the prep was excellent using Suprep  The instrument was then slowly withdrawn as the colon was fully examined.   COLON FINDINGS: Five sessile and semi-pedunculated polyps measuring 2-10 mm in size were found in the ascending colon and descending colon.  A polypectomy was performed with cold forceps (1 - ascending), with a cold snare (2) and using snare cautery (2).  The resection was complete and the polyp tissue was completely retrieved.   The colon mucosa was otherwise normal.   A  right colon retroflexion was performed.  Retroflexed views revealed internal hemorrhoids. The time to cecum=3 minutes 59 seconds.  Withdrawal time=13 minutes 14 seconds.  The scope was withdrawn and the procedure completed. COMPLICATIONS: There were no complications.  ENDOSCOPIC IMPRESSION: 1.   Five sessile polyps measuring 2-10 mm in size were found in the ascending colon and descending colon; polypectomy was performed with cold forceps, with a cold snare and using snare cautery 2.   The colon mucosa was otherwise normal - excellent prep 3.   Internal hemorrhoids  RECOMMENDATIONS: 1.  Hold aspirin, aspirin products, and anti-inflammatory medication for 2 weeks. 2.  Timing of repeat colonoscopy will be determined by pathology findings.  eSigned:  Gatha Mayer, MD, [facility] 07/04/2013 9:44 AM cc: The Patient

## 2013-07-04 NOTE — Patient Instructions (Addendum)
I found and removed 5 polyps today. They all look benign. You also have internal hemorrhoids.  If you have hemorrhoid problems (swelling, itching, bleeding) I am able to treat those with an in-office procedure. If you like, please call my office at [phone] to schedule an appointment and I can evaluate you further.  I will let you know pathology results and when to have another routine colonoscopy by mail.  I appreciate the opportunity to care for you. Gatha Mayer, MD, [facility]  Information sheet given on polyps.  YOU HAD AN ENDOSCOPIC PROCEDURE TODAY AT [facility]: Refer to the procedure report that was given to you for any specific questions about what was found during the examination.  If the procedure report does not answer your questions, please call your gastroenterologist to clarify.  If you requested that your care partner not be given the details of your procedure findings, then the procedure report has been included in a sealed envelope for you to review at your convenience later.  YOU SHOULD EXPECT: Some feelings of bloating in the abdomen. Passage of more gas than usual.  Walking can help get rid of the air that was put into your GI tract during the procedure and reduce the bloating. If you had a lower endoscopy (such as a colonoscopy or flexible sigmoidoscopy) you may notice spotting of blood in your stool or on the toilet paper. If you underwent a bowel prep for your procedure, then you may not have a normal bowel movement for a few days.  DIET: Your first meal following the procedure should be a light meal and then it is ok to progress to your normal diet.  A half-sandwich or bowl of soup is an example of a good first meal.  Heavy or fried foods are harder to digest and may make you feel nauseous or bloated.  Likewise meals heavy in dairy and vegetables can cause extra gas to form and this can also increase the bloating.  Drink plenty of fluids but you should avoid  alcoholic beverages for 24 hours.  ACTIVITY: Your care partner should take you home directly after the procedure.  You should plan to take it easy, moving slowly for the rest of the day.  You can resume normal activity the day after the procedure however you should NOT DRIVE or use heavy machinery for 24 hours (because of the sedation medicines used during the test).    SYMPTOMS TO REPORT IMMEDIATELY: A gastroenterologist can be reached at any hour.  During normal business hours, 8:30 AM to 5:00 PM Monday through Friday, call [phone].  After hours and on weekends, please call the GI answering service at [phone] who will take a message and have the physician on call contact you.   Following lower endoscopy (colonoscopy or flexible sigmoidoscopy):  Excessive amounts of blood in the stool  Significant tenderness or worsening of abdominal pains  Swelling of the abdomen that is new, acute  Fever of 100F or higher  FOLLOW UP: If any biopsies were taken you will be contacted by phone or by letter within the next 1-3 weeks.  Call your gastroenterologist if you have not heard about the biopsies in 3 weeks.  Our staff will call the home number listed on your records the next business day following your procedure to check on you and address any questions or concerns that you may have at that time regarding the information given to you following your procedure. This  is a courtesy call and so if there is no answer at the home number and we have not heard from you through the emergency physician on call, we will assume that you have returned to your regular daily activities without incident.  SIGNATURES/CONFIDENTIALITY: You and/or your care partner have signed paperwork which will be entered into your electronic medical record.  These signatures attest to the fact that that the information above on your After Visit Summary has been reviewed and is understood.  Full responsibility of the  confidentiality of this discharge information lies with you and/or your care-partner.

## 2013-07-04 NOTE — Progress Notes (Signed)
Patient blood pressure taken in left arm 189/116, right arm 188/121. Taken manuel and obtained reading of 185/120. Dr. Carlean Purl and Jenny Reichmann Nultey,[facility] notified, states ok to proceed.If patient blood pressure med with him he can take it. Pt states he has a "patch" on .

## 2013-07-04 NOTE — Progress Notes (Signed)
A/ox3 pleased with MAC, report to [facility]

## 2013-07-05 ENCOUNTER — Telehealth: Payer: Self-pay | Admitting: *Deleted

## 2013-07-05 NOTE — Telephone Encounter (Signed)
  Follow up Call-  Call back number 07/04/2013  Post procedure Call Back phone  # [phone]  Permission to leave phone message Yes   Spoke with pt's wife  Patient questions:  Do you have a fever, pain , or abdominal swelling? no Pain Score  0 *  Have you tolerated food without any problems? yes  Have you been able to return to your normal activities? yes  Do you have any questions about your discharge instructions: Diet   no Medications  no Follow up visit  no  Do you have questions or concerns about your Care? no  Actions: * If pain score is 4 or above: No action needed, pain <4.

## 2013-07-06 ENCOUNTER — Encounter: Payer: Self-pay | Admitting: Neurology

## 2013-07-09 ENCOUNTER — Encounter: Payer: Self-pay | Admitting: Internal Medicine

## 2013-07-09 NOTE — Progress Notes (Signed)
Quick Note:  5 adenomas max 10 mm - repeat colon 2018 ______

## 2013-07-12 ENCOUNTER — Encounter: Payer: Self-pay | Admitting: Neurology

## 2013-07-12 ENCOUNTER — Encounter (INDEPENDENT_AMBULATORY_CARE_PROVIDER_SITE_OTHER): Payer: Self-pay

## 2013-07-12 ENCOUNTER — Ambulatory Visit (INDEPENDENT_AMBULATORY_CARE_PROVIDER_SITE_OTHER): Payer: Medicare Other | Admitting: Neurology

## 2013-07-12 VITALS — BP 216/127 | HR 88 | Temp 98.0°F | Ht 68.25 in | Wt 194.0 lb

## 2013-07-12 DIAGNOSIS — R251 Tremor, unspecified: Secondary | ICD-10-CM

## 2013-07-12 DIAGNOSIS — R259 Unspecified abnormal involuntary movements: Secondary | ICD-10-CM

## 2013-07-12 DIAGNOSIS — G4733 Obstructive sleep apnea (adult) (pediatric): Secondary | ICD-10-CM

## 2013-07-12 DIAGNOSIS — F411 Generalized anxiety disorder: Secondary | ICD-10-CM

## 2013-07-12 DIAGNOSIS — I10 Essential (primary) hypertension: Secondary | ICD-10-CM

## 2013-07-12 NOTE — Patient Instructions (Addendum)
Please continue using your CPAP regularly. While your insurance requires that you use CPAP at least 4 hours each night on 70% of the nights, I recommend, that you not skip any nights and use it throughout the night if you can. Getting used to CPAP does take time and patience and discipline. Untreated obstructive sleep apnea when it is moderate to severe can have an adverse impact on cardiovascular health and raise her risk for heart disease, arrhythmias, hypertension, congestive heart failure, stroke and diabetes. Untreated obstructive sleep apnea causes sleep disruption, nonrestorative sleep, and sleep deprivation. This can have an impact on your day to day functioning and cause daytime sleepiness and impairment of cognitive function, memory loss, mood disturbance, and problems focussing. Using CPAP regularly can improve these symptoms.  Please talk to your PCP about BP management today and alternative medication for your anxiety, which likely exacerbates your tremor.    Please remember, that any kind of tremor may be exacerbated by anxiety, anger, nervousness, excitement, dehydration, sleep deprivation, by caffeine, and low blood sugar values or blood sugar fluctuations. Some medications, especially some antidepressants and lithium can cause or exacerbate tremors. Tremors may temporarily calm down her subside with the use of a benzodiazepine such as Valium or related medications and with alcohol. Be aware however that drinking alcohol is not an approved treatment or appropriate treatment for tremor control and long-term use of benzodiazepines such as Valium, lorazepam, alprazolam, or clonazepam can cause habit formation, physical and psychological addiction.

## 2013-07-12 NOTE — Progress Notes (Signed)
Subjective:    Harrington ID: Drew Harrington is a 68 y.o. male.  HPI  Interim history:   Drew Harrington is a very pleasant 68 year old right-handed gentleman with an underlying medical history of insomnia, allergic rhinitis, hypertension, borderline diabetes, bladder cancer, who presents for followup consultation of his tremors as well as his severe OSA. He is unaccompanied today. I first met him on 04/05/2013 at Drew request of his primary care provider for evaluation of his upper extremity tremor. I felt at Drew time that he most likely had essential tremor with a differential diagnoses of enhanced physiologic tremor, driven by anxiety. He had quit taking his Zoloft on his own and I encouraged him to restart it based on his anxiety. I discussed treatment options for her tremors including increasing his atenolol or trying Mysoline. I was worried about his blood pressure which was quite high. I also felt that he had signs and symptoms and a history concerning for OSA and suggested we proceed with a sleep study.  He had a split-night sleep study on 04/25/2013 and I went over his test results with him in detail today. His baseline sleep efficiency was reduced at 67.6% with a latency to sleep prolonged at 44 minutes and wake after sleep onset of 17.5 minutes with mild sleep fragmentation noted. He a mildly elevated arousal index. He had increased percentages were stage I and stage II sleep. He had absence of deep sleep and an increased percentage of REM sleep. He had a reduced REM latency. He had mild to moderate snoring. His overall AHI was 52.4 per hour. His baseline oxygen saturation was 93%, his nadir was 77% and REM sleep. He was therefore titrated on CPAP. His average oxygen saturation while on CPAP improved to 95%. His arousal index markedly improved to 1 arousal per hour. He had a increased percentage stage II sleep, absence of deep sleep and a normal percentage of REM sleep. He was titrated from 5-6 cm of  water pressure and on Drew final pressure of 6 cm his AHI was 0 per hour. He indicated that he slept better with CPAP at Drew time. I reviewed his compliance data from 05/16/2013 to 06/14/2013 which is a total of 30 days during which time he his CPAP every night except for 4 days. It looks like initially his compliance was much better and slowly declined with time. His average usage was 4 hours and 15 minutes. His percent used days greater than 4 hours was only 50% indicating mediocre compliance. Residual AHI was 3.4 and a pressure of 6 with no EPR. Today, I also reviewed his most recent compliance data from his machine: 06/12/2013 to 07/11/2013 which is a total of 30 days during which time he used CPAP only 11 nights. His average usage was 54 minutes. His percent used days greater than 4 hours was only 10%. His pressure is 6 cm, his residual AHI is 10.4.   Today, he reports that Drew CPAP did not help his tremors, so he essentially stopped using Drew CPAP. He does indicate that when he used it he slept better. He is here to discuss further tremor treatment but did not restart his Zoloft. He felt it caused him side effects. I have encouraged him to talk to his primary care physician about alternative treatment of his anxiety. His blood pressure was rather high today and he stated that he had not taken his clonidine and needed to pick it up today. He denies any chest pain,  shortness of breath, blurry vision, headache today. He again reports that history number tends to flareup when he talks with Drew Harrington or is coumadinized or is under stress or pressure or is nervous or anxious.  He has a history of intermittent upper extremity tremor for Drew past 3+ years. He quit smoking and drinking 20 years ago. His tremor affects his writing, signing, feeding himself or with holding something. He was tried on Zoloft, but only tried it for week and stopped it on his own. 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 He endorses his tremor worsens with anxiety and stress and pressure.   His Past Medical History Is Significant For: Past Medical History  Diagnosis Date  . Unspecified hemorrhoids without mention of complication   . Benign neoplasm of colon   . Psychosexual dysfunction with inhibited sexual excitement   . Insomnia, unspecified   . Allergic rhinitis   . HTN (hypertension)   . Borderline diabetes   . Personal history of colonic adenomas 04/10/2010  . Bladder cancer   . Perirectal abscess 05/10/2013    His Past Surgical History Is Significant For: Past Surgical History  Procedure Laterality Date  . Cataract extraction w/ intraocular lens  implant, bilateral    . Transurethral resection of bladder tumor  10/08/2011    Procedure: CIRCUMCISION AND TRANSURETHRAL RESECTION OF BLADDER TUMOR (TURBT);  Surgeon: [name], MD;  Location: [facility];  Service: Urology;  Laterality: N/A;  . Transthoracic echocardiogram  04-06-2006    LVSF NORMAL/  EF 60%/ AORTIC ROOT AT UPPER LIMITS OF NORMAL SIZE  . Transurethral resection of bladder tumor  11/30/2011    Procedure: TRANSURETHRAL RESECTION OF BLADDER TUMOR (TURBT);  Surgeon: [name], MD;  Location: [facility];  Service: Urology;  Laterality: N/A;  . Removal cyst hip Left 05/23/2013    His Family History Is Significant For: Family History  Problem Relation Age of Onset  . Lung disease Father   . Cancer Father     lung  . Gout Other   . Heart disease Other   . Hypertension Other   . Cancer Sister     lung  . Cancer Sister     lung  . Cancer Sister     lung  . Colon cancer Neg Hx     His Social History Is Significant For: History   Social History  . Marital Status: Married    Spouse Name: N/A    Number of Children: N/A  . Years of Education: N/A   Occupational History  . Part Time Ortho Tech [employer]   Social History  Main Topics  . Smoking status: Former Smoker    Quit date: 05/10/1993  . Smokeless tobacco: Never Used  . Alcohol Use: No  . Drug Use: No  . Sexual Activity: Yes   Other Topics Concern  . None   Social History Narrative   [employer]   Trained as orthopedic tech   Married-divorced; remarried '78   2 sons - '68, '79    His Allergies Are:  Allergies  Allergen Reactions  . Sildenafil Other (See Comments)    REACTION: Headache  :   His Current Medications Are:  Outpatient Encounter Prescriptions as of 07/12/2013  Medication Sig  . atenolol (TENORMIN) 25 MG tablet   . cloNIDine (CATAPRES - DOSED IN MG/24 HR) 0.3 mg/24hr patch APPLY 1 PATCH  ONTO Drew SKIN ONCE WEEKLY  . lovastatin (MEVACOR) 40 MG tablet Take 40 mg by mouth daily.  . Multiple Vitamins-Minerals (CENTRUM SILVER PO) Take by mouth.    . sertraline (ZOLOFT) 50 MG tablet Take 50 mg by mouth daily.  :  Review of Systems:  Out of a complete 14 point review of systems, all are reviewed and negative with Drew exception of these symptoms as listed below:   Review of Systems  Constitutional: Negative.   HENT: Negative.   Eyes: Negative.   Respiratory: Negative.   Cardiovascular: Negative.   Gastrointestinal: Negative.   Endocrine: Negative.   Genitourinary: Negative.   Musculoskeletal: Negative.   Skin: Negative.   Allergic/Immunologic: Negative.   Neurological: Positive for tremors (Start when under stress.).  Hematological: Negative.   Psychiatric/Behavioral: Positive for agitation. Drew Harrington is nervous/anxious.     Objective:  Neurologic Exam  Physical Exam Physical Examination:   Filed Vitals:   07/12/13 1116  BP: 216/127  Pulse: 88  Temp:      General Examination: Drew Harrington is a very pleasant 68 y.o. male in no acute distress. He appears well-developed and well-nourished and well groomed. He is overweight. He denies any acute chest pain, shortness of breath, blurry vision. He is mildly anxious  appearing and does become upset with me during Drew visit as he feels I am not helping him for his tremors.   HEENT: Normocephalic, atraumatic, pupils are equal, round and reactive to light and accommodation. Funduscopic exam is normal with sharp disc margins noted. Extraocular tracking is good without limitation to gaze excursion or nystagmus noted. Normal smooth pursuit is noted. Hearing is grossly intact. Face is symmetric with normal facial animation and normal facial sensation. Speech is clear with no dysarthria noted. There is no hypophonia. There is no lip, neck/head, jaw or voice tremor. Neck is supple with full range of passive and active motion. There are no carotid bruits on auscultation. Oropharynx exam reveals: mild mouth dryness, dentures in upper and lower, and moderate airway crowding, due to redundant soft palate, tonsillar size of 2+, and larger tongue. Mallampati is class II. Tongue protrudes centrally and palate elevates symmetrically. Tonsils are 2+ in size. Neck size is 16 inches.   Chest: Clear to auscultation without wheezing, rhonchi or crackles noted.  Heart: S1+S2+0, regular and normal without murmurs, rubs or gallops noted.   Abdomen: Soft, non-tender and non-distended with normal bowel sounds appreciated on auscultation.  Extremities: There is no pitting edema in Drew distal lower extremities bilaterally. Pedal pulses are intact.  Skin: Warm and dry without trophic changes noted. There are no varicose veins.  Musculoskeletal: exam reveals no obvious joint deformities, tenderness or joint swelling or erythema.   Neurologically:  Mental status: Drew Harrington is awake, alert and oriented in all 4 spheres. His memory, attention, language and knowledge are appropriate. There is no aphasia, agnosia, apraxia or anomia. Speech is clear with normal prosody and enunciation. Thought process is linear. Mood is congruent and affect is normal.  Cranial nerves are as described above under  HEENT exam. In addition, shoulder shrug is normal with equal shoulder height noted.  Motor exam: Normal bulk, strength and tone is noted. There is no drift, resting tremor or rebound. He has a subtle b/l UE postural and action tremor. Romberg is negative. Reflexes are 2+ throughout. Fine motor skills are intact with normal finger taps, normal hand movements, normal rapid alternating patting, normal foot taps and normal foot agility. There is  no decrement in amplitude.  Cerebellar testing shows no dysmetria or intention tremor on finger to nose testing. Heel to shin is unremarkable bilaterally. There is no truncal or gait ataxia.  Sensory exam is intact to light touch, pinprick, vibration, temperature sense and proprioception in Drew upper and lower extremities.  Gait, station and balance are unremarkable. No veering to one side is noted. No leaning to one side is noted. Posture is age-appropriate and stance is narrow based. No problems turning are noted. He turns en bloc. Tandem walk is unremarkable. Intact toe and heel stance is noted.               Assessment and Plan:   In summary, Drew Harrington is a very pleasant 69 year old male with a history of  hypertension, obesity, borderline diabetes, who has had an intermittent tremor in his upper extremities for Drew past 3 years. He feels this is progressive. Upon examination, he has a mild upper extremity postural and action tremor. Today I feel that given his history he most likely has an enhanced physiological tremor and probably less likely essential tremor given Drew more recent onset of his symptoms. He also endorses anxiety and unfortunately is no longer on an anxiety medication. I encouraged him to discuss with his primary care physician alternative treatment for his anxiety and he became upset with me indicating that he was here for treatment for tremors and he stopped using CPAP because it did not help his tremors. Today I explained to him that my  reasoning for doing a sleep study was his complaint of not sleeping well. I have explained to him last time for tremor can flareup when there is trigger such as dehydration, skipping meals, fluctuation in blood sugar values, stress, anxiety and poor sleep. Because of his sleep related complaints I suggested he come back for sleep study and it turns out he has severe sleep apnea. Unfortunately he stopped using his CPAP despite sleeping well with it. Today I spent a long time explaining my reasoning to him. I would like for him to try medication for anxiety first to see if his tremor reduce his. He really does not have a severe tremor at this time. Mysoline for example could cause side effects including sedation and off-balance and he really does need to be compliant with CPAP therapy. He requested that he see a second opinion for his tremors and I offered him a referral to look our neurology but he declined. His blood pressure is very high today but he does not have symptoms of chest pain or shortness of breath or blurry vision or diaphoresis or of her mental status. Nevertheless, I advised him never to skip his blood pressure medication and also explaining to him Drew link between untreated OSA and difficult to control hypertension. I explained to him that he should follow up with his primary care physician if possible today for his blood pressure and to discuss alternative treatment for anxiety. He did come in to going back to using CPAP as he did not really have a problem using it and in fact felt that he slept well with it. I would like for him to come back for sleep apnea followup and also followup on his tremors as I explained to him that rather than taking additional tremor medication there is a possibility that if he sleeps better and is consistent with CPAP treatment he may feel that his tremor improves. I again explained to him that some  medications for anxiety take several weeks to kick in. I think he  prematurely stopped Drew Zoloft. I suggested a three-month followup.

## 2013-07-17 ENCOUNTER — Encounter: Payer: Self-pay | Admitting: Neurology

## 2013-07-18 ENCOUNTER — Encounter (INDEPENDENT_AMBULATORY_CARE_PROVIDER_SITE_OTHER): Payer: Self-pay | Admitting: General Surgery

## 2013-07-18 ENCOUNTER — Ambulatory Visit (INDEPENDENT_AMBULATORY_CARE_PROVIDER_SITE_OTHER): Payer: Medicare Other | Admitting: General Surgery

## 2013-07-18 VITALS — BP 166/89 | HR 72 | Temp 98.1°F | Resp 18 | Ht 69.0 in | Wt 192.8 lb

## 2013-07-18 DIAGNOSIS — K6289 Other specified diseases of anus and rectum: Secondary | ICD-10-CM

## 2013-07-18 NOTE — Progress Notes (Signed)
Drew Harrington is a 68 y.o. male who is here for a follow up visit regarding his perianal abscess. He states that he has minimal drainage and the pain is much better. His colonoscopy revealed no signs of IBD.  Objective:  Filed Vitals:   07/18/13 1409  BP: 166/89  Pulse: 72  Temp: 98.1 F (36.7 C)  Resp: 18   General appearance: alert and cooperative  GI: normal findings: soft, non-tender  Anal: no drainage, small punctate lesion without signs of active inflammation  Assessment and Plan:  Drew Harrington Is a 68 year old male who is status post drainage of a perirectal abscess. This abscess appears to most consistent with perianal Crohn's disease but his colonoscopy was normal. The area seems to be healing well.  I think there is a small chance that it could turn into a fistula.  He will call if he has persistent drainage or the wound does not continue to heal

## 2013-07-18 NOTE — Patient Instructions (Signed)
Keep the area clean and dry and wash with soap and water daily.  Return to the office for worsening pain or persistent drainage.

## 2013-08-01 ENCOUNTER — Telehealth: Payer: Self-pay

## 2013-08-01 DIAGNOSIS — R251 Tremor, unspecified: Secondary | ICD-10-CM

## 2013-08-01 NOTE — Telephone Encounter (Signed)
Patient states he still has tremors in his hands. Per Dr Linda Hedges he will need a referral to neurology, not a neurosurgeon. Referral placed to [facility] neurology.

## 2013-08-01 NOTE — Telephone Encounter (Signed)
The patient called and is hoping to get a referral to [facility] Neurology with Dr.Roy   Pt callback [phone]

## 2013-08-01 NOTE — Telephone Encounter (Signed)
Reviewed x-ray reports from October '14: normal cervical spine, normal lumbar spine, mild DJD hips. For what reason am I referring Drew Harrington to a neurosurgeon, Dr. Carloyn Manner.

## 2013-08-16 ENCOUNTER — Encounter: Payer: Self-pay | Admitting: Neurology

## 2013-08-16 ENCOUNTER — Encounter: Payer: Self-pay | Admitting: Internal Medicine

## 2013-08-16 ENCOUNTER — Ambulatory Visit: Payer: Medicare Other | Admitting: Internal Medicine

## 2013-08-16 ENCOUNTER — Ambulatory Visit (INDEPENDENT_AMBULATORY_CARE_PROVIDER_SITE_OTHER): Payer: Medicare Other | Admitting: Neurology

## 2013-08-16 ENCOUNTER — Ambulatory Visit (INDEPENDENT_AMBULATORY_CARE_PROVIDER_SITE_OTHER): Payer: Medicare Other | Admitting: Internal Medicine

## 2013-08-16 ENCOUNTER — Encounter (INDEPENDENT_AMBULATORY_CARE_PROVIDER_SITE_OTHER): Payer: Self-pay

## 2013-08-16 VITALS — BP 170/120 | HR 75 | Temp 97.9°F | Wt 193.0 lb

## 2013-08-16 VITALS — BP 185/120 | HR 72 | Temp 98.0°F | Ht 69.29 in | Wt 195.2 lb

## 2013-08-16 DIAGNOSIS — G250 Essential tremor: Secondary | ICD-10-CM

## 2013-08-16 DIAGNOSIS — I10 Essential (primary) hypertension: Secondary | ICD-10-CM

## 2013-08-16 DIAGNOSIS — G252 Other specified forms of tremor: Principal | ICD-10-CM

## 2013-08-16 MED ORDER — PRIMIDONE 50 MG PO TABS: 50.0000 mg | ORAL_TABLET | Freq: Every day | ORAL | Status: DC

## 2013-08-16 MED ORDER — LOSARTAN POTASSIUM 50 MG PO TABS: 50.0000 mg | ORAL_TABLET | Freq: Every day | ORAL | Status: DC

## 2013-08-16 MED ORDER — FUROSEMIDE 40 MG PO TABS: 40.0000 mg | ORAL_TABLET | Freq: Every day | ORAL | Status: DC

## 2013-08-16 MED ORDER — ATENOLOL 25 MG PO TABS: 25.0000 mg | ORAL_TABLET | Freq: Every day | ORAL | Status: DC

## 2013-08-16 NOTE — Progress Notes (Signed)
Pre visit review using our clinic review tool, if applicable. No additional management support is needed unless otherwise documented below in the visit note. 

## 2013-08-16 NOTE — Patient Instructions (Addendum)
It has been a pleasure providing medical care for you today.   1) Blood pressure - We will stop catapres patch - Restart atenolol 25mg  - Start furosemide 40mg . This is a water pill that lowers your blood pressure and will work well with other medications.  - Start losartan 50mg .  - Return to office on March 4th for blood pressure / medicine check.   Hypertension As your heart beats, it forces blood through your arteries. This force is your blood pressure. If the pressure is too high, it is called hypertension (HTN) or high blood pressure. HTN is dangerous because you may have it and not know it. High blood pressure may mean that your heart has to work harder to pump blood. Your arteries may be narrow or stiff. The extra work puts you at risk for heart disease, stroke, and other problems.  Blood pressure consists of two numbers, a higher number over a lower, 110/72, for example. It is stated as "110 over 72." The ideal is below 120 for the top number (systolic) and under 80 for the bottom (diastolic). Write down your blood pressure today. You should pay close attention to your blood pressure if you have certain conditions such as:  Heart failure.  Prior heart attack.  Diabetes  Chronic kidney disease.  Prior stroke.  Multiple risk factors for heart disease. To see if you have HTN, your blood pressure should be measured while you are seated with your arm held at the level of the heart. It should be measured at least twice. A one-time elevated blood pressure reading (especially in the Emergency Department) does not mean that you need treatment. There may be conditions in which the blood pressure is different between your right and left arms. It is important to see your caregiver soon for a recheck. Most people have essential hypertension which means that there is not a specific cause. This type of high blood pressure may be lowered by changing lifestyle factors such  as:  Stress.  Smoking.  Lack of exercise.  Excessive weight.  Drug/tobacco/alcohol use.  Eating less salt. Most people do not have symptoms from high blood pressure until it has caused damage to the body. Effective treatment can often prevent, delay or reduce that damage. TREATMENT  When a cause has been identified, treatment for high blood pressure is directed at the cause. There are a large number of medications to treat HTN. These fall into several categories, and your caregiver will help you select the medicines that are best for you. Medications may have side effects. You should review side effects with your caregiver. If your blood pressure stays high after you have made lifestyle changes or started on medicines,   Your medication(s) may need to be changed.  Other problems may need to be addressed.  Be certain you understand your prescriptions, and know how and when to take your medicine.  Be sure to follow up with your caregiver within the time frame advised (usually within two weeks) to have your blood pressure rechecked and to review your medications.  If you are taking more than one medicine to lower your blood pressure, make sure you know how and at what times they should be taken. Taking two medicines at the same time can result in blood pressure that is too low. SEEK IMMEDIATE MEDICAL CARE IF:  You develop a severe headache, blurred or changing vision, or confusion.  You have unusual weakness or numbness, or a faint feeling.  You have  severe chest or abdominal pain, vomiting, or breathing problems. MAKE SURE YOU:   Understand these instructions.  Will watch your condition.  Will get help right away if you are not doing well or get worse. Document Released: 06/14/2005 Document Revised: 09/06/2011 Document Reviewed: 02/02/2008 [facility] Patient Information 2014 [place].

## 2013-08-16 NOTE — Progress Notes (Signed)
Subjective:    Drew Harrington was seen in consultation in the movement disorder clinic at the request of Adella Hare, MD.  The evaluation is for tremor.  He has previously seen [name] for the same.  The records that were made available to me were reviewed.  The dx given by [name] was tremor exacerbated by anxiety and enhanced physiologic tremor.  It looks like the dx of ET was entertained initially but later felt that it was likely enhanced physiologic tremor, worsened by anxiety.  The patient is a 68 y.o. right handed male with a history of tremor for 10 years.  Tremor is in both hands.  It is nowhere else.  He notices the tremor the most when typing on the computer at work.  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  There is no family hx of tremor.  He has no balance problem.    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  Allergies  Allergen Reactions  . Sildenafil Other (See Comments)    REACTION: Headache    Current Outpatient Prescriptions on File Prior to Visit  Medication Sig Dispense Refill  . cloNIDine (CATAPRES - DOSED IN MG/24 HR) 0.3 mg/24hr patch APPLY 1 PATCH ONTO THE SKIN ONCE WEEKLY  4 patch  3  . lovastatin (MEVACOR) 40 MG tablet Take 40 mg by mouth daily.        . Multiple Vitamins-Minerals (CENTRUM SILVER PO) Take by mouth.         Current Facility-Administered Medications on File Prior to Visit  Medication Dose Route Frequency Provider Last Rate Last Dose  . mitomycin (MUTAMYCIN) chemo injection 40 mg  40 mg Bladder Instillation Once [name], MD        Past Medical History  Diagnosis Date  . Unspecified hemorrhoids without mention of complication   . Benign neoplasm of colon   . Psychosexual dysfunction with inhibited sexual excitement   . Insomnia, unspecified   . Allergic rhinitis   . HTN (hypertension)   . Borderline diabetes   . Personal history of colonic adenomas 04/10/2010  . Bladder cancer   . Perirectal abscess 05/10/2013    Past Surgical History  Procedure Laterality Date  . Cataract extraction w/ intraocular lens  implant, bilateral    . Transurethral resection of bladder tumor  10/08/2011    Procedure: CIRCUMCISION AND TRANSURETHRAL RESECTION OF BLADDER TUMOR (TURBT);  Surgeon: [name], MD;  Location: [facility];  Service: Urology;  Laterality: N/A;  . Transthoracic echocardiogram  04-06-2006    LVSF NORMAL/  EF 60%/ AORTIC ROOT AT UPPER LIMITS OF NORMAL SIZE  . Transurethral resection of bladder tumor  11/30/2011    Procedure: TRANSURETHRAL RESECTION OF BLADDER TUMOR (TURBT);  Surgeon: [name], MD;  Location: [facility];  Service: Urology;  Laterality: N/A;  . Removal cyst hip Left 05/23/2013    History   Social History  . Marital Status: Married    Spouse Name: N/A    Number of Children: N/A  . Years of Education: N/A   Occupational History  . Part Time Ortho Tech [employer]   Social History Main Topics  . Smoking status: Former Smoker    Quit date: 05/10/1993  . Smokeless tobacco: Never Used  . Alcohol Use: No  . Drug Use: No  . Sexual Activity: Yes   Other Topics Concern  . Not on file   Social History Narrative   HSG    Trained as orthopedic tech   Married-divorced; remarried '78   2 sons - '68, '79    Family Status  Relation Status Death Age  . Father Deceased 22    Lung CA  . Sister Deceased     CA, colon  . Sister Deceased     HTN  . Sister Deceased   . Mother Deceased 55    Intestinal blockage    Review of Systems A complete 10 system ROS was obtained and was negative apart from what is mentioned.   Objective:   VITALS:   Filed Vitals:   08/16/13 0941 08/16/13 1022  BP: 182/120 185/120  Pulse: 72   Temp: 98 F (36.7 C)   Height: 5' 9.29" (1.76 m)   Weight: 195 lb 4 oz (88.565 kg)    Gen:  Appears stated age and in NAD. HEENT:  Normocephalic, atraumatic. The mucous membranes are dry. The superficial temporal arteries are without ropiness or tenderness. Cardiovascular: Regular rate and rhythm. Lungs: Clear to auscultation bilaterally. Neck: There are no carotid bruits noted bilaterally.  NEUROLOGICAL:  Orientation:  The patient is alert and oriented x 3.  Recent and remote memory are intact.  Attention span and concentration are normal.  Able to name objects and repeat without trouble.  Fund of knowledge is appropriate Cranial nerves: There is good facial symmetry. The pupils are equal round and reactive to light bilaterally. Fundoscopic exam reveals clear disc margins bilaterally. Extraocular muscles are intact and visual fields are full to confrontational testing. Speech is fluent and clear. Soft palate rises symmetrically and there is no tongue deviation. Hearing is intact to conversational tone. Tone: Tone is good throughout.  No rigidity. Sensation: Sensation is intact to light touch and pinprick throughout (facial, trunk, extremities). Vibration is intact at the bilateral big toe but slightly decreased. There is no extinction with double simultaneous stimulation. There is no sensory dermatomal level identified. Coordination:  The patient has no dysdiadichokinesia or  dysmetria. Motor: Strength is 5/5 in the bilateral upper and lower extremities.  Shoulder shrug is equal bilaterally.  There is no pronator drift.  There are no fasciculations noted. DTR's: Deep tendon reflexes are 2/4 at the bilateral biceps, triceps, brachioradialis, patella trace at the bilateral and achilles.  Plantar responses are downgoing bilaterally. Gait and Station: The patient is able to ambulate without difficulty. He does, however, have decreased arm swing on the R.  The patient is able to heel toe walk without any difficulty. The patient is able to ambulate in a tandem fashion. The patient is able to stand in the Romberg position.   MOVEMENT EXAM: Tremor:  There is mild tremor of the outstretched hands that increases with intention.  The patient has minimal trouble with  archimedes spirals.  He does have trouble with writing a sentence.  There is no tremor at rest.  The patient is able to pour water from one glass to another without spilling it.    LABS:  Lab Results  Component Value Date   TSH 2.09 06/23/2009   No results found for this basename: VITAMINB12     Assessment/Plan:   1.   Tremor.  -I agree with Dr Rexene Alberts that the ddx lies between enhanced physiologic tremor and ET.  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  Nonetheless, this is really a semantic issue.  The pt just wants a tx.  After some discussion, we decided on primidone.  Risks, benefits, side effects and alternative therapies were discussed.  The opportunity to ask questions was given and they were answered to the best of my ability.  The patient expressed understanding and willingness to follow the outlined treatment protocols. 2.  HTN  -I am very concerned about his BP.  It was rechecked 3 times during the visit.  I reviewed flowsheets of prior vitals and BP has been very high in the past as well.  He is unwilling to go to [facility].  I  called Dr. Linda Hedges office and they graciously worked the patient in today. 3.  Return in about 10 weeks (around 10/25/2013).  At that time, I will check some labs, including his thyroid.  I didn't realize until after he left for his PCP office that it had not been done since 2010.

## 2013-08-16 NOTE — Progress Notes (Signed)
Subjective:     Patient ID: Drew Harrington, male   DOB: [birth year], 68 y.o.   MRN: [id number]  HPI  Patient presents today with a significantly increased blood pressure at outside provider of 180/120, in office is 170/120. Was previously taking atenolol, but stopped the medication for some reason. Is not opposed to restarting it.   Past Medical History  Diagnosis Date  . Unspecified hemorrhoids without mention of complication   . Benign neoplasm of colon   . Psychosexual dysfunction with inhibited sexual excitement   . Insomnia, unspecified   . Allergic rhinitis   . HTN (hypertension)   . Borderline diabetes   . Personal history of colonic adenomas 04/10/2010  . Bladder cancer   . Perirectal abscess 05/10/2013   Past Surgical History  Procedure Laterality Date  . Cataract extraction w/ intraocular lens  implant, bilateral    . Transurethral resection of bladder tumor  10/08/2011    Procedure: CIRCUMCISION AND TRANSURETHRAL RESECTION OF BLADDER TUMOR (TURBT);  Surgeon: [name], MD;  Location: [facility];  Service: Urology;  Laterality: N/A;  . Transthoracic echocardiogram  04-06-2006    LVSF NORMAL/  EF 60%/ AORTIC ROOT AT UPPER LIMITS OF NORMAL SIZE  . Transurethral resection of bladder tumor  11/30/2011    Procedure: TRANSURETHRAL RESECTION OF BLADDER TUMOR (TURBT);  Surgeon: [name], MD;  Location[facility];  Service: Urology;  Laterality: N/A;  . Removal cyst hip Left 05/23/2013   Family History  Problem Relation Age of Onset  . Lung disease Father   . Cancer Father     lung  . Gout Other   . Heart disease Other   . Hypertension Other   . Cancer Sister     lung  . Cancer Sister     lung  . Cancer Sister     lung  . Colon cancer Neg Hx    History   Social History  . Marital Status: Married    Spouse Name: N/A    Number of Children: N/A  . Years of Education: N/A   Occupational History  . Part  Time Ortho Tech [employer]   Social History Main Topics  . Smoking status: Former Smoker    Quit date: 05/10/1993  . Smokeless tobacco: Never Used  . Alcohol Use: No  . Drug Use: No  . Sexual Activity: Yes   Other Topics Concern  . Not on file   Social History Narrative   HSG   Trained as orthopedic tech   Married-divorced; remarried '78   2 sons - '68, '79      Review of Systems  System review is negative for any constitutional, cardiac, pulmonary, GI or neuro symptoms or complaints other than as described in the HPI.     Objective:   Physical Exam Filed Vitals:   08/16/13 1621  BP: 170/120  Pulse: 75  Temp: 97.9 F (36.6 C)   BP Readings from Last 3 Encounters:  08/16/13 170/120  08/16/13 185/120  07/18/13 166/89   Wt Readings from Last 3 Encounters:  08/16/13 193 lb (87.544 kg)  08/16/13 195 lb 4 oz (88.565 kg)  07/18/13 192 lb 12.8 oz (87.454 kg)    General: Well developed, well nourished, NAD, appears stated age HEENT: NCAT, PERRLA, EOMI, Anicteic Sclera, mucous membranes moist.  Neck: Supple, no JVD, no masses  Cardiovascular: S1 S2 auscultated, no rubs, murmurs or gallops. Regular rate and rhythm.  Respiratory: Clear  to auscultation bilaterally with equal chest rise  Abdomen: Soft, nontender, nondistended, + bowel sounds  Extremities: warm, dry without cyanosis, clubbing, or edema  Neuro: Alert and Oriented x3, cranial nerves II-XII grossly intact.  Skin: Without rashes, exudates, or nodules  Psych: Normal mood and affect with intact judgement and insight  Current Outpatient Prescriptions on File Prior to Visit  Medication Sig Dispense Refill  . cloNIDine (CATAPRES - DOSED IN MG/24 HR) 0.3 mg/24hr patch APPLY 1 PATCH ONTO THE SKIN ONCE WEEKLY  4 patch  3  . lovastatin (MEVACOR) 40 MG tablet Take 40 mg by mouth daily.      . Multiple Vitamins-Minerals (CENTRUM SILVER PO) Take by mouth.        . primidone (MYSOLINE) 50 MG tablet Take 1 tablet (50  mg total) by mouth at bedtime.  30 tablet  5   Current Facility-Administered Medications on File Prior to Visit  Medication Dose Route Frequency Provider Last Rate Last Dose  . mitomycin (MUTAMYCIN) chemo injection 40 mg  40 mg Bladder Instillation Once [name], MD           Assessment and Plan:

## 2013-08-16 NOTE — Assessment & Plan Note (Addendum)
BP Readings from Last 3 Encounters:  08/16/13 170/120  08/16/13 185/120  07/18/13 166/89   Patient presents today with a significantly increased blood pressure at outside provider this morning. Stopped his atenolol. Patient took himself off of atenolol before his last visit in July '14. Have tried amlodipine, furosemide, valsartan up to 320mg .   Plan Discontinue Clonidine patch  Resume Atenolol 25mg   Start Furosemide 40mg   Start Losartan 50mg 

## 2013-08-16 NOTE — Patient Instructions (Addendum)
1. Start primidone - 50 mg - 1/2 tablet at bedtime for 3 nights and then increase to one tablet at bedtime.   2. Go directly to Dr Daylene Posey office. He will work you into his schedule to evaluate your elevated blood pressure today.  3. Follow up in 8-12 weeks.

## 2013-08-29 ENCOUNTER — Encounter: Payer: Self-pay | Admitting: Internal Medicine

## 2013-08-29 ENCOUNTER — Ambulatory Visit (INDEPENDENT_AMBULATORY_CARE_PROVIDER_SITE_OTHER): Payer: Medicare Other | Admitting: Internal Medicine

## 2013-08-29 VITALS — BP 178/120 | HR 80 | Temp 97.2°F | Wt 195.4 lb

## 2013-08-29 DIAGNOSIS — I10 Essential (primary) hypertension: Secondary | ICD-10-CM

## 2013-08-29 MED ORDER — LOSARTAN POTASSIUM 100 MG PO TABS: 100.0000 mg | ORAL_TABLET | Freq: Every day | ORAL | Status: DC

## 2013-08-29 MED ORDER — ATENOLOL 100 MG PO TABS: 100.0000 mg | ORAL_TABLET | Freq: Every day | ORAL | Status: DC

## 2013-08-29 NOTE — Patient Instructions (Signed)
Blood pressure remains too high.  Plan Increase atenolol to 100 mg once a day  Increase losartan to 100 mg once a day  Continue furosemide  BP Readings from Last 3 Encounters:  08/29/13 178/120  08/16/13 170/120  08/16/13 185/120   Come next week- Wednesday for Blood pressure

## 2013-08-29 NOTE — Progress Notes (Signed)
Pre visit review using our clinic review tool, if applicable. No additional management support is needed unless otherwise documented below in the visit note. 

## 2013-08-30 NOTE — Progress Notes (Signed)
   Subjective:    Patient ID: Drew Harrington, male    DOB: December 09, 1945, 68 y.o.   MRN: [id number]  HPI Drew Harrington returns for BP follow up. His regimen was recently changed: clonidine was dropped and he is on ARB, Diuretic and BB. He reports being adherent to his regimen although he has not taken his medication this AM after working a third shift at [employer]. He has been asymptomatic and has not had any adverse drug effects.  PMH, FamHx and SocHx reviewed for any changes and relevance.  Current Outpatient Prescriptions on File Prior to Visit  Medication Sig Dispense Refill  . cloNIDine (CATAPRES - DOSED IN MG/24 HR) 0.3 mg/24hr patch APPLY 1 PATCH ONTO THE SKIN ONCE WEEKLY  4 patch  3  . furosemide (LASIX) 40 MG tablet Take 1 tablet (40 mg total) by mouth daily.  90 tablet  3  . lovastatin (MEVACOR) 40 MG tablet Take 40 mg by mouth daily.      . Multiple Vitamins-Minerals (CENTRUM SILVER PO) Take by mouth.        . primidone (MYSOLINE) 50 MG tablet Take 1 tablet (50 mg total) by mouth at bedtime.  30 tablet  5   Current Facility-Administered Medications on File Prior to Visit  Medication Dose Route Frequency Provider Last Rate Last Dose  . mitomycin (MUTAMYCIN) chemo injection 40 mg  40 mg Bladder Instillation Once [name], MD          Review of Systems System review is negative for any constitutional, cardiac, pulmonary, GI or neuro symptoms or complaints other than as described in the HPI.     Objective:   Physical Exam Filed Vitals:   08/29/13 0834  BP: 178/120  Pulse: 80  Temp: 97.2 F (36.2 C)   BP Readings from Last 3 Encounters:  08/29/13 178/120  08/16/13 170/120  08/16/13 185/120   Gen'l- WNWD man in no distress HEENT - fundoscopic exam reveals absence of hemorrhages, papilledema, vascular changes Cor - RRR w/ tachycardia to 97 on exam Pulm - clear lungs Neuro - no focal findings.       Assessment & Plan:

## 2013-08-30 NOTE — Assessment & Plan Note (Signed)
Poor BP control. Elevated heart rate noted. Mr. Drew Harrington reports he has been taking Atenolol 25 mg bid.  Plan Continue diuretic  Increase Atenolol to 100 mg once a day  Increase losartan to 100 mg once a day  Return in 1 week for BP check. If not controlled will restart amlodipine

## 2013-09-05 ENCOUNTER — Ambulatory Visit: Payer: Medicare Other | Admitting: Internal Medicine

## 2013-09-13 ENCOUNTER — Telehealth: Payer: Self-pay | Admitting: Neurology

## 2013-09-13 NOTE — Telephone Encounter (Signed)
Pt called wanting to speak to a nurse. He states that his medication is not working for him.

## 2013-09-14 NOTE — Telephone Encounter (Signed)
Patient states his Primidone is trying to work but feels like he needs it to be a little stronger. I advised him to stay on the 50 mg dose over the weekend and I would ask Dr Tat if dosage can be increased and we would get back with him on Monday. He agrees with this plan.

## 2013-09-16 NOTE — Telephone Encounter (Signed)
Have him try 50 mg bid instead of qday

## 2013-09-17 NOTE — Telephone Encounter (Signed)
Spoke with patient and made him aware he can increase Primidone to 50 mg bid. He will do this and let us know how he feels. He will call if he needs an additional prescription because of the increase.

## 2013-10-03 ENCOUNTER — Other Ambulatory Visit: Payer: Self-pay | Admitting: Neurology

## 2013-10-03 MED ORDER — PRIMIDONE 50 MG PO TABS: 50.0000 mg | ORAL_TABLET | Freq: Two times a day (BID) | ORAL | Status: DC

## 2013-10-03 NOTE — Telephone Encounter (Signed)
Primidone refill requested. Per last office note- patient to remain on medication and increase to two times daily. Refill approved and sent to patient's pharmacy.

## 2013-10-25 ENCOUNTER — Ambulatory Visit (INDEPENDENT_AMBULATORY_CARE_PROVIDER_SITE_OTHER): Payer: Medicare Other | Admitting: Neurology

## 2013-10-25 ENCOUNTER — Telehealth: Payer: Self-pay | Admitting: Neurology

## 2013-10-25 ENCOUNTER — Encounter: Payer: Self-pay | Admitting: Neurology

## 2013-10-25 VITALS — BP 146/90 | HR 64 | Resp 14 | Ht 69.0 in | Wt 198.0 lb

## 2013-10-25 DIAGNOSIS — R251 Tremor, unspecified: Secondary | ICD-10-CM

## 2013-10-25 DIAGNOSIS — G252 Other specified forms of tremor: Principal | ICD-10-CM

## 2013-10-25 DIAGNOSIS — G250 Essential tremor: Secondary | ICD-10-CM

## 2013-10-25 MED ORDER — PRIMIDONE 50 MG PO TABS: ORAL_TABLET | ORAL | Status: DC

## 2013-10-25 NOTE — Progress Notes (Signed)
Subjective:    Drew Harrington was seen in consultation in the movement disorder clinic at the request of Adella Hare, MD.  The evaluation is for tremor.  He has previously seen [name] for the same.  The records that were made available to me were reviewed.  The dx given by [name] was tremor exacerbated by anxiety and enhanced physiologic tremor.  It looks like the dx of ET was entertained initially but later felt that it was likely enhanced physiologic tremor, worsened by anxiety.  The patient is a 68 y.o. right handed male with a history of tremor for 10 years.  Tremor is in both hands.  It is nowhere else.  He notices the tremor the most when typing on the computer at work.  He works in [employer] and does casts and splints and notes that he will tremor when he is doing that and when there are more than 3-4 family members in the room.  There is no family hx of tremor.  He has no balance problem.    10/25/13 update:  The patient presents today for followup.  On tapering 19th, I started the patient on primidone.  The patient call me back on March 22 and we increased the dose to twice a day dosing.  He does think that it is helping, but thinks that he can still use a higher dosage.  He is not having any side effects with the medication.  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  He notes that when he is under "a high pressure situation with a doctor" he will have tremor.  His BP is under better control and he has an appt after this with his PCP to continue to work with his BP medications.  Outside reports reviewed: historical medical records and referral letter/letters.  Allergies  Allergen Reactions  . Sildenafil Other (See Comments)    REACTION: Headache    Current Outpatient Prescriptions on File Prior to Visit  Medication Sig Dispense Refill  . atenolol (TENORMIN) 100 MG tablet Take 1 tablet (100 mg total) by mouth daily.  60 tablet  3  . cloNIDine (CATAPRES - DOSED IN MG/24 HR) 0.3  mg/24hr patch APPLY 1 PATCH ONTO THE SKIN ONCE WEEKLY  4 patch  3  . losartan (COZAAR) 100 MG tablet Take 1 tablet (100 mg total) by mouth daily.  60 tablet  5  . lovastatin (MEVACOR) 40 MG tablet Take 40 mg by mouth daily.      . Multiple Vitamins-Minerals (CENTRUM SILVER PO) Take by mouth.         Current Facility-Administered Medications on File Prior to Visit  Medication Dose Route Frequency Provider Last Rate Last Dose  . mitomycin (MUTAMYCIN) chemo injection 40 mg  40 mg Bladder Instillation Once [name], MD        Past Medical History  Diagnosis Date  . Unspecified hemorrhoids without mention of complication   . Benign neoplasm of colon   . Psychosexual dysfunction with inhibited sexual excitement   . Insomnia, unspecified   . Allergic rhinitis   . HTN (hypertension)   . Borderline diabetes   . Personal history of colonic adenomas 04/10/2010  . Bladder cancer   . Perirectal abscess 05/10/2013    Past Surgical History  Procedure Laterality Date  . Cataract extraction w/ intraocular lens  implant, bilateral    . Transurethral resection of bladder tumor  10/08/2011    Procedure: CIRCUMCISION AND TRANSURETHRAL RESECTION OF  BLADDER TUMOR (TURBT);  Surgeon: [name], MD;  Location[facility];  Service: Urology;  Laterality: N/A;  . Transthoracic echocardiogram  04-06-2006    LVSF NORMAL/  EF 60%/ AORTIC ROOT AT UPPER LIMITS OF NORMAL SIZE  . Transurethral resection of bladder tumor  11/30/2011    Procedure: TRANSURETHRAL RESECTION OF BLADDER TUMOR (TURBT);  Surgeon: [name], MD;  Location: [facility];  Service: Urology;  Laterality: N/A;  . Removal cyst hip Left 05/23/2013    History   Social History  . Marital Status: Married    Spouse Name: N/A    Number of Children: N/A  . Years of Education: N/A   Occupational History  . 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  Smoking status: Former Smoker    Quit date: 05/10/1993  . Smokeless tobacco: Never Used  . Alcohol Use: No  . Drug Use: No  . Sexual Activity: Yes   Other Topics Concern  . Not on file   Social History Narrative   HSG   Trained as orthopedic tech   Married-divorced; remarried '78   2 sons - '68, '79    Family Status  Relation Status Death Age  . Father Deceased 51    Lung CA  . Sister Deceased     CA, colon  . Sister Deceased     HTN  . Sister Deceased   . Mother Deceased 13    Intestinal blockage    Review of Systems A complete 10 system ROS was obtained and was negative apart from what is mentioned.   Objective:   VITALS:   Filed Vitals:   10/25/13 1014  BP: 146/90  Pulse: 64  Resp: 14  Height: 5\' 9"  (1.753 m)  Weight: 198 lb (89.812 kg)   Gen:  Appears stated age and in NAD. HEENT:  Normocephalic, atraumatic. The mucous membranes are dry. The superficial temporal arteries are without ropiness or tenderness. Cardiovascular: Regular rate and rhythm. Lungs: Clear to auscultation bilaterally. Neck: There are no carotid bruits noted bilaterally.  NEUROLOGICAL:  Orientation:  The patient is alert and oriented x 3.  Recent and remote memory are intact.  Attention span and concentration are normal.  Able to name objects and repeat without trouble.  Fund of knowledge is appropriate Cranial nerves: There is good facial symmetry. The pupils are equal round and reactive to light bilaterally. Fundoscopic exam reveals clear disc margins bilaterally. Extraocular muscles are intact and visual fields are full to confrontational testing. Speech is fluent and clear. Soft palate rises symmetrically and there is no tongue deviation. Hearing is intact to conversational tone. Tone: Tone is good throughout.  No rigidity. Sensation: Sensation is intact to light touch and pinprick throughout (facial, trunk, extremities). Vibration is intact at the bilateral big toe but slightly  decreased. There is no extinction with double simultaneous stimulation. There is no sensory dermatomal level identified. Coordination:  The patient has no dysdiadichokinesia or dysmetria. Motor: Strength is 5/5 in the bilateral upper and lower extremities.  Shoulder shrug is equal bilaterally.  There is no pronator drift.  There are no fasciculations noted. DTR's: Deep tendon reflexes are 2/4 at the bilateral biceps, triceps, brachioradialis, patella trace at the bilateral and achilles.  Plantar responses are downgoing bilaterally. Gait and Station: The patient is able to ambulate without difficulty. He does, however, have decreased arm swing on the R.  The patient is able to  heel toe walk without any difficulty. The patient is able to ambulate in a tandem fashion. The patient is able to stand in the Romberg position.   MOVEMENT EXAM: Tremor:  There is mild tremor of the outstretched hands that increases with intention.  The patient has minimal trouble with archimedes spirals.  He does have trouble with writing a sentence.  There is no tremor at rest.  The patient is able to pour water from one glass to another without spilling it.    LABS:  Lab Results  Component Value Date   TSH 2.09 06/23/2009   No results found for this basename: VITAMINB12     Assessment/Plan:   1.   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  It will be increased to 50 mg in the AM and 100 mg at night. 2.  HTN  -Under better control and is working closely with PCP. 3.  Return in about 5 months (around 03/27/2014).

## 2013-10-25 NOTE — Telephone Encounter (Signed)
Message copied by Annamaria Helling on Thu Oct 25, 2013 11:24 AM ------      Message from: TAT, [place]: Thu Oct 25, 2013 10:39 AM       Luvenia Starch,            Please tell pt that I sent the primidone.  Also, apologize to him for me but I just realized that he didn't have thyroid checked since 2010 and he really needs that done.  I know he works at [employer] so maybe we can just put an order in for him and he can get it done??  Dx is tremor ------

## 2013-10-25 NOTE — Telephone Encounter (Signed)
Patient made aware we need to get a TSH level on him. He states he will be at Dr Spruill's office on 10/30/2013 and will have it drawn then. Order placed.

## 2013-11-09 ENCOUNTER — Telehealth: Payer: Self-pay | Admitting: Neurology

## 2013-11-09 ENCOUNTER — Encounter: Payer: Self-pay | Admitting: Neurology

## 2013-11-09 NOTE — Telephone Encounter (Signed)
Unable to leave message for patient about rescheduling 11/27/13 appointment per Dr. Guadelupe Sabin schedule, the line started beeping very loud and there was no option to leave a voicemail message. Printed and sent letter with new appointment time.

## 2013-11-27 ENCOUNTER — Ambulatory Visit: Payer: Medicare Other | Admitting: Neurology

## 2014-01-01 ENCOUNTER — Telehealth: Payer: Self-pay | Admitting: *Deleted

## 2014-01-01 DIAGNOSIS — E1165 Type 2 diabetes mellitus with hyperglycemia: Principal | ICD-10-CM

## 2014-01-01 DIAGNOSIS — IMO0001 Reserved for inherently not codable concepts without codable children: Secondary | ICD-10-CM

## 2014-01-01 NOTE — Telephone Encounter (Signed)
Patient has an appointment. Lipid, a1c, bmet ordered Diabetic bundle

## 2014-02-27 ENCOUNTER — Ambulatory Visit (INDEPENDENT_AMBULATORY_CARE_PROVIDER_SITE_OTHER): Payer: Medicare Other | Admitting: Neurology

## 2014-02-27 ENCOUNTER — Encounter: Payer: Self-pay | Admitting: Neurology

## 2014-02-27 VITALS — BP 138/108 | HR 74 | Ht 70.0 in | Wt 204.0 lb

## 2014-02-27 DIAGNOSIS — G250 Essential tremor: Secondary | ICD-10-CM | POA: Insufficient documentation

## 2014-02-27 DIAGNOSIS — G252 Other specified forms of tremor: Principal | ICD-10-CM

## 2014-02-27 MED ORDER — PRIMIDONE 50 MG PO TABS: 100.0000 mg | ORAL_TABLET | Freq: Two times a day (BID) | ORAL | Status: DC

## 2014-02-27 NOTE — Progress Notes (Signed)
Subjective:    Drew Harrington was seen in consultation in the movement disorder clinic at the request of Drew Millers, MD.  The evaluation is for tremor.  He has previously seen Dr. Rexene Harrington for the same.  The records that were made available to me were reviewed.  The dx given by Dr. Rexene Harrington was tremor exacerbated by anxiety and enhanced physiologic tremor.  It looks like the dx of ET was entertained initially but later felt that it was likely enhanced physiologic tremor, worsened by anxiety.  The patient is a 68 y.o. right handed male with a history of tremor for 10 years.  Tremor is in both hands.  It is nowhere else.  He notices the tremor the most when typing on the computer at work.  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  There is no family hx of tremor.  He has no balance problem.    10/25/13 update:  The patient presents today for followup.  On tapering 19th, I started the patient on primidone.  The patient call me back on March 22 and we increased the dose to twice a day dosing.  He does think that it is helping, but thinks that he can still use a higher dosage.  He is not having any side effects with the medication.  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  He notes that when he is under "a high pressure situation with a doctor" he will have tremor.  His BP is under better control and he has an appt after this with his PCP to continue to work with his BP medications.  02/27/14 update:  Pt is f/u today.  Last visit, I increased his primidone to 50 mg in the AM and 100 mg at night.  He is on atenolol as well and thinks that it helps when he takes it together with the primidone.  He wonders if he can increase primidone further.  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  Continuing to work with PCP to get BP under better control but changing PCP due to retirement.  Outside reports  reviewed: historical medical records and referral letter/letters.  Allergies  Allergen Reactions  . Sildenafil Other (See Comments)    REACTION: Headache    Current Outpatient Prescriptions on File Prior to Visit  Medication Sig Dispense Refill  . atenolol (TENORMIN) 100 MG tablet Take 1 tablet (100 mg total) by mouth daily.  60 tablet  3  . cloNIDine (CATAPRES - DOSED IN MG/24 HR) 0.3 mg/24hr patch APPLY 1 PATCH ONTO THE SKIN ONCE WEEKLY  4 patch  3  . furosemide (LASIX) 40 MG tablet Take 20 mg by mouth daily.      Marland Kitchen losartan (COZAAR) 100 MG tablet Take 1 tablet (100 mg total) by mouth daily.  60 tablet  5  . lovastatin (MEVACOR) 40 MG tablet Take 40 mg by mouth daily.      . Multiple Vitamins-Minerals (CENTRUM SILVER PO) Take by mouth.         Current Facility-Administered Medications on File Prior to Visit  Medication Dose Route Frequency Provider Last Rate Last Dose  . mitomycin (MUTAMYCIN) chemo injection 40 mg  40 mg Bladder Instillation Once [name], MD        Past Medical History  Diagnosis Date  . Unspecified hemorrhoids without  mention of complication   . Benign neoplasm of colon   . Psychosexual dysfunction with inhibited sexual excitement   . Insomnia, unspecified   . Allergic rhinitis   . HTN (hypertension)   . Borderline diabetes   . Personal history of colonic adenomas 04/10/2010  . Bladder cancer   . Perirectal abscess 05/10/2013    Past Surgical History  Procedure Laterality Date  . Cataract extraction w/ intraocular lens  implant, bilateral    . Transurethral resection of bladder tumor  10/08/2011    Procedure: CIRCUMCISION AND TRANSURETHRAL RESECTION OF BLADDER TUMOR (TURBT);  Surgeon: Drew Bonine, MD;  Location: [facility];  Service: Urology;  Laterality: N/A;  . Transthoracic echocardiogram  04-06-2006    LVSF NORMAL/  EF 60%/ AORTIC ROOT AT UPPER LIMITS OF NORMAL SIZE  . Transurethral resection of bladder tumor   11/30/2011    Procedure: TRANSURETHRAL RESECTION OF BLADDER TUMOR (TURBT);  Surgeon: Drew Bonine, MD;  Location: [facility];  Service: Urology;  Laterality: N/A;  . Removal cyst hip Left 05/23/2013    History   Social History  . Marital Status: Married    Spouse Name: N/A    Number of Children: N/A  . Years of Education: N/A   Occupational History  . Part Time Ortho Tech [employer]   Social History Main Topics  . Smoking status: Former Smoker    Quit date: 05/10/1993  . Smokeless tobacco: Never Used  . Alcohol Use: No  . Drug Use: No  . Sexual Activity: Yes   Other Topics Concern  . Not on file   Social History Narrative   [employer]   Trained as orthopedic tech   Married-divorced; remarried '78   2 sons - '68, '79    Family Status  Relation Status Death Age  . Father Deceased 36    Lung CA  . Sister Deceased     CA, colon  . Sister Deceased     HTN  . Sister Deceased   . Mother Deceased 67    Intestinal blockage    Review of Systems A complete 10 system ROS was obtained and was negative apart from what is mentioned.   Objective:   VITALS:   Filed Vitals:   02/27/14 1010  BP: 138/108  Pulse: 74  Height: 5\' 10"  (1.778 m)  Weight: 204 lb (92.534 kg)  SpO2: 97%   Gen:  Appears stated age and in NAD. HEENT:  Normocephalic, atraumatic. The mucous membranes are dry. The superficial temporal arteries are without ropiness or tenderness. Cardiovascular: Regular rate and rhythm. Lungs: Clear to auscultation bilaterally. Neck: There are no carotid bruits noted bilaterally.  NEUROLOGICAL:  Orientation:  The patient is alert and oriented x 3.  Recent and remote memory are intact.  Attention span and concentration are normal.  Able to name objects and repeat without trouble.  Fund of knowledge is appropriate Cranial nerves: There is good facial symmetry.  Soft palate rises symmetrically and there is no tongue deviation. Hearing is intact to  conversational tone. Tone: Tone is good throughout.  No rigidity. Sensation: Sensation is intact to light touch throughout Coordination:  The patient has no dysdiadichokinesia or dysmetria. Motor: Strength is 5/5 in the bilateral upper and lower extremities.  Shoulder shrug is equal bilaterally.  There is no pronator drift.  There are no fasciculations noted. Gait and Station: The patient is able to ambulate without difficulty. He does, however, have decreased arm swing  on the R.    MOVEMENT EXAM: Tremor:  There is mild tremor of the outstretched hands that increases with intention.  The patient has minimal trouble with archimedes spirals.  He does have trouble with writing his name.  There is no tremor at rest.  T    LABS:  Lab Results  Component Value Date   TSH 2.09 06/23/2009   No results found for this basename: VITAMINB12     Assessment/Plan:   1.   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  It will be increased to 100 mg in the AM and 100 mg at night.  Advised not to stop this med "cold [place]." 2.  HTN  -Working closely with PCP. 3.  Return in about 6 months (around 08/28/2014).

## 2014-03-12 ENCOUNTER — Other Ambulatory Visit (INDEPENDENT_AMBULATORY_CARE_PROVIDER_SITE_OTHER): Payer: Medicare Other

## 2014-03-12 ENCOUNTER — Ambulatory Visit (INDEPENDENT_AMBULATORY_CARE_PROVIDER_SITE_OTHER): Payer: Medicare Other | Admitting: Internal Medicine

## 2014-03-12 ENCOUNTER — Encounter: Payer: Self-pay | Admitting: Internal Medicine

## 2014-03-12 VITALS — BP 152/92 | HR 68 | Temp 98.4°F | Resp 12 | Ht 69.0 in | Wt 204.0 lb

## 2014-03-12 DIAGNOSIS — R259 Unspecified abnormal involuntary movements: Secondary | ICD-10-CM

## 2014-03-12 DIAGNOSIS — G252 Other specified forms of tremor: Secondary | ICD-10-CM

## 2014-03-12 DIAGNOSIS — G250 Essential tremor: Secondary | ICD-10-CM

## 2014-03-12 DIAGNOSIS — I10 Essential (primary) hypertension: Secondary | ICD-10-CM

## 2014-03-12 DIAGNOSIS — E785 Hyperlipidemia, unspecified: Secondary | ICD-10-CM

## 2014-03-12 DIAGNOSIS — E119 Type 2 diabetes mellitus without complications: Secondary | ICD-10-CM

## 2014-03-12 DIAGNOSIS — IMO0001 Reserved for inherently not codable concepts without codable children: Secondary | ICD-10-CM

## 2014-03-12 DIAGNOSIS — E1165 Type 2 diabetes mellitus with hyperglycemia: Secondary | ICD-10-CM

## 2014-03-12 LAB — BASIC METABOLIC PANEL
BUN: 13 mg/dL (ref 6–23)
CALCIUM: 9.4 mg/dL (ref 8.4–10.5)
CO2: 31 mEq/L (ref 19–32)
CREATININE: 1.2 mg/dL (ref 0.4–1.5)
Chloride: 101 mEq/L (ref 96–112)
GFR: 75.03 mL/min (ref >60.00)
Glucose, Bld: 202 mg/dL — ABNORMAL HIGH (ref 70–99)
Potassium: 3.4 mEq/L — ABNORMAL LOW (ref 3.5–5.1)
SODIUM: 139 meq/L (ref 135–145)

## 2014-03-12 LAB — HEMOGLOBIN A1C: HEMOGLOBIN A1C: 8.1 % — AB (ref 4.6–6.5)

## 2014-03-12 NOTE — Progress Notes (Signed)
Pre visit review using our clinic review tool, if applicable. No additional management support is needed unless otherwise documented below in the visit note. 

## 2014-03-12 NOTE — Assessment & Plan Note (Signed)
He is on lovastatin at this time.

## 2014-03-12 NOTE — Assessment & Plan Note (Signed)
Will recheck HgA1c today and evaluate need for medication. Foot exam done today. On ARB.

## 2014-03-12 NOTE — Assessment & Plan Note (Addendum)
Will stop furosemide at this time as it is likely not helping much for BP control. Will see him back after taking medications next visit and re-evaluate need for further medications. Could trial combo pill with ARB instead of adding new medicine. Talked to him about losing about 5 pounds to help with his BP. Filed Vitals:   03/12/14 0811 03/12/14 0829  BP: 172/102 152/92  Pulse: 68   Temp: 98.4 F (36.9 C)   TempSrc: Oral   Resp: 12   Height: 5\' 9"  (1.753 m)   Weight: 204 lb (92.534 kg)   SpO2: 96%

## 2014-03-12 NOTE — Assessment & Plan Note (Signed)
He continues to see neurologist but his current medication is helping him significantly.

## 2014-03-12 NOTE — Progress Notes (Signed)
   Subjective:    Patient ID: Drew Harrington, male    DOB: Jun 07, 1946, 68 y.o.   MRN: [id number]  HPI The patient is a 68 YO man with PMH of DM type 2, HTN, hyperlipidemia who is coming in today to establish care. He would like to reduce medications if possible and is urinating 3-4 times after taking lasix (1/2 pill) daily. He has not taken his medications yet today as he works overnight several nights per week casting at [employer]. He has no complaints at today's visit except that he would like to work on losing some weight. He denies chest pains or SOB. He denies abdominal pain, diarrhea, constipation. He denies any problems with arthritis or stiffness in his joints. He would like to start exercising more. He denies numbness or tingling in his feet.   Review of Systems  Constitutional: Negative for fever, activity change, appetite change and fatigue.  HENT: Negative.   Eyes: Negative.   Respiratory: Negative for cough, chest tightness and shortness of breath.   Cardiovascular: Negative for chest pain, palpitations and leg swelling.  Gastrointestinal: Negative for abdominal pain, diarrhea, constipation and abdominal distention.  Endocrine: Negative.   Musculoskeletal: Negative for arthralgias, gait problem and myalgias.  Skin: Negative for wound.  Neurological: Negative for dizziness, weakness, light-headedness and headaches.      Objective:   Physical Exam  Vitals reviewed. Constitutional: He is oriented to person, place, and time. He appears well-developed and well-nourished.  Overweight  HENT:  Head: Normocephalic and atraumatic.  Eyes: EOM are normal.  Neck: Normal range of motion.  Cardiovascular: Normal rate and regular rhythm.   No murmur heard. Pulmonary/Chest: Effort normal and breath sounds normal. No respiratory distress. He has no wheezes. He has no rales.  Abdominal: Soft. Bowel sounds are normal. He exhibits no distension. There is no tenderness. There is no  rebound.  Neurological: He is alert and oriented to person, place, and time. Coordination normal.  Skin: Skin is warm and dry.   Filed Vitals:   03/12/14 0811 03/12/14 0829  BP: 172/102 152/92  Pulse: 68   Temp: 98.4 F (36.9 C)   TempSrc: Oral   Resp: 12   Height: 5\' 9"  (1.753 m)   Weight: 204 lb (92.534 kg)   SpO2: 96%        Assessment & Plan:

## 2014-03-12 NOTE — Assessment & Plan Note (Deleted)
He continues to see neurologist but his current medication is helping him significantly.

## 2014-03-12 NOTE — Patient Instructions (Signed)
We will have you stop taking the furosemide. We will check your sugars with a HgA1c today and check on your kidneys.   We may need to start a medicine for your diabetes depending on how high that number is. Things you can do to keep from being on medicines would be to exercise and work on decreasing the amount of sweets that you eat.   Exercise to Lose Weight Exercise and a healthy diet may help you lose weight. Your doctor may suggest specific exercises. EXERCISE IDEAS AND TIPS  Choose low-cost things you enjoy doing, such as walking, bicycling, or exercising to workout videos.  Take stairs instead of the elevator.  Walk during your lunch break.  Park your car further away from work or school.  Go to a gym or an exercise class.  Start with 5 to 10 minutes of exercise each day. Build up to 30 minutes of exercise 4 to 6 days a week.  Wear shoes with good support and comfortable clothes.  Stretch before and after working out.  Work out until you breathe harder and your heart beats faster.  Drink extra water when you exercise.  Do not do so much that you hurt yourself, feel dizzy, or get very short of breath. Exercises that burn about 150 calories:  Running 1  miles in 15 minutes.  Playing volleyball for 45 to 60 minutes.  Washing and waxing a car for 45 to 60 minutes.  Playing touch football for 45 minutes.  Walking 1  miles in 35 minutes.  Pushing a stroller 1  miles in 30 minutes.  Playing basketball for 30 minutes.  Raking leaves for 30 minutes.  Bicycling 5 miles in 30 minutes.  Walking 2 miles in 30 minutes.  Dancing for 30 minutes.  Shoveling snow for 15 minutes.  Swimming laps for 20 minutes.  Walking up stairs for 15 minutes.  Bicycling 4 miles in 15 minutes.  Gardening for 30 to 45 minutes.  Jumping rope for 15 minutes.  Washing windows or floors for 45 to 60 minutes. Document Released: 07/17/2010 Document Revised: 09/06/2011 Document  Reviewed: 07/17/2010 [facility] Patient Information 2015 [place], [place]. This information is not intended to replace advice given to you by your health care provider. Make sure you discuss any questions you have with your health care provider.   Diabetes Mellitus and Food It is important for you to manage your blood sugar (glucose) level. Your blood glucose level can be greatly affected by what you eat. Eating healthier foods in the appropriate amounts throughout the day at about the same time each day will help you control your blood glucose level. It can also help slow or prevent worsening of your diabetes mellitus. Healthy eating may even help you improve the level of your blood pressure and reach or maintain a healthy weight.  HOW CAN FOOD AFFECT ME? Carbohydrates Carbohydrates affect your blood glucose level more than any other type of food. Your dietitian will help you determine how many carbohydrates to eat at each meal and teach you how to count carbohydrates. Counting carbohydrates is important to keep your blood glucose at a healthy level, especially if you are using insulin or taking certain medicines for diabetes mellitus. Alcohol Alcohol can cause sudden decreases in blood glucose (hypoglycemia), especially if you use insulin or take certain medicines for diabetes mellitus. Hypoglycemia can be a life-threatening condition. Symptoms of hypoglycemia (sleepiness, dizziness, and disorientation) are similar to symptoms of having too much alcohol.  If  your health care provider has given you approval to drink alcohol, do so in moderation and use the following guidelines:  Women should not have more than one drink per day, and men should not have more than two drinks per day. One drink is equal to:  12 oz of beer.  5 oz of wine.  1 oz of hard liquor.  Do not drink on an empty stomach.  Keep yourself hydrated. Have water, diet soda, or unsweetened iced tea.  Regular soda, juice, and  other mixers might contain a lot of carbohydrates and should be counted. WHAT FOODS ARE NOT RECOMMENDED? As you make food choices, it is important to remember that all foods are not the same. Some foods have fewer nutrients per serving than other foods, even though they might have the same number of calories or carbohydrates. It is difficult to get your body what it needs when you eat foods with fewer nutrients. Examples of foods that you should avoid that are high in calories and carbohydrates but low in nutrients include:  Trans fats (most processed foods list trans fats on the Nutrition Facts label).  Regular soda.  Juice.  Candy.  Sweets, such as cake, pie, doughnuts, and cookies.  Fried foods. WHAT FOODS CAN I EAT? Have nutrient-rich foods, which will nourish your body and keep you healthy. The food you should eat also will depend on several factors, including:  The calories you need.  The medicines you take.  Your weight.  Your blood glucose level.  Your blood pressure level.  Your cholesterol level. You also should eat a variety of foods, including:  Protein, such as meat, poultry, fish, tofu, nuts, and seeds (lean animal proteins are best).  Fruits.  Vegetables.  Dairy products, such as milk, cheese, and yogurt (low fat is best).  Breads, grains, pasta, cereal, rice, and beans.  Fats such as olive oil, trans fat-free margarine, canola oil, avocado, and olives. DOES EVERYONE WITH DIABETES MELLITUS HAVE THE SAME MEAL PLAN? Because every person with diabetes mellitus is different, there is not one meal plan that works for everyone. It is very important that you meet with a dietitian who will help you create a meal plan that is just right for you. Document Released: 03/11/2005 Document Revised: 06/19/2013 Document Reviewed: 05/11/2013 [facility] Patient Information 2015 [place], [place]. This information is not intended to replace advice given to you by your health care  provider. Make sure you discuss any questions you have with your health care provider.

## 2014-03-28 ENCOUNTER — Other Ambulatory Visit: Payer: Self-pay | Admitting: Geriatric Medicine

## 2014-03-28 MED ORDER — ATENOLOL 100 MG PO TABS: 100.0000 mg | ORAL_TABLET | Freq: Every day | ORAL | Status: DC

## 2014-04-11 ENCOUNTER — Ambulatory Visit: Payer: Medicare Other | Admitting: Neurology

## 2014-06-17 ENCOUNTER — Ambulatory Visit: Payer: Medicare Other | Admitting: Internal Medicine

## 2014-06-25 ENCOUNTER — Encounter: Payer: Self-pay | Admitting: Internal Medicine

## 2014-06-25 ENCOUNTER — Ambulatory Visit (INDEPENDENT_AMBULATORY_CARE_PROVIDER_SITE_OTHER): Payer: Medicare Other | Admitting: Internal Medicine

## 2014-06-25 ENCOUNTER — Other Ambulatory Visit (INDEPENDENT_AMBULATORY_CARE_PROVIDER_SITE_OTHER): Payer: Medicare Other

## 2014-06-25 VITALS — BP 148/90 | HR 71 | Temp 98.1°F | Resp 18 | Ht 70.0 in | Wt 201.0 lb

## 2014-06-25 DIAGNOSIS — E119 Type 2 diabetes mellitus without complications: Secondary | ICD-10-CM

## 2014-06-25 LAB — HEMOGLOBIN A1C: Hgb A1c MFr Bld: 7.5 % — ABNORMAL HIGH (ref 4.6–6.5)

## 2014-06-25 MED ORDER — METFORMIN HCL 500 MG PO TABS: 500.0000 mg | ORAL_TABLET | Freq: Two times a day (BID) | ORAL | Status: DC

## 2014-06-25 NOTE — Patient Instructions (Signed)
We will start a medicine for your diabetes which should also help you to lose some weight. If you can get your weight down we may be able to stop it at a later time. It is called metformin. For the first week you take 1 pill with breakfast, after that take 1 pill with breakfast and 1 pill with dinner.   The most common side effect from this medicine is loose stools or diarrhea. Usually this gets better with the first week or two of taking the medicine. If it does not get better or you are not able to tolerate the loose stools stop the medicine and call our office for advice.   We will see you back in about 3-6 months to check on your weight loss. Keep up the good work with the exercise.

## 2014-06-25 NOTE — Progress Notes (Signed)
Pre visit review using our clinic review tool, if applicable. No additional management support is needed unless otherwise documented below in the visit note. 

## 2014-06-25 NOTE — Assessment & Plan Note (Signed)
Patient states he was unaware of his sugar problems and will start medication. Start metformin 500 mg BID. Checking HgA1c today. Will follow in 3-6 months for continued weight loss and efficacy of therapy. He is on ARB.

## 2014-06-25 NOTE — Progress Notes (Signed)
   Subjective:    Patient ID: Drew Harrington, male    DOB: 29-May-1946, 68 y.o.   MRN: [id number]  HPI The patient is a 68 YO man who is coming in to follow up on his sugars. He has worked to lose about 3 pounds since last visit. He has been exercising and will work with a trainer in the next couple of weeks. He has been taking his BP meds and still wants to know if he has to take all his medicines or if some can be stopped.   Review of Systems  Constitutional: Negative for fever, activity change, appetite change and fatigue.  HENT: Negative.   Eyes: Negative.   Respiratory: Negative for cough, chest tightness and shortness of breath.   Cardiovascular: Negative for chest pain, palpitations and leg swelling.  Gastrointestinal: Negative for abdominal pain, diarrhea, constipation and abdominal distention.  Endocrine: Negative.   Musculoskeletal: Negative for myalgias, arthralgias and gait problem.  Skin: Negative for wound.  Neurological: Negative for dizziness, weakness, light-headedness and headaches.      Objective:   Physical Exam  Constitutional: He is oriented to person, place, and time. He appears well-developed and well-nourished.  Overweight  HENT:  Head: Normocephalic and atraumatic.  Eyes: EOM are normal.  Neck: Normal range of motion.  Cardiovascular: Normal rate and regular rhythm.   No murmur heard. Pulmonary/Chest: Effort normal and breath sounds normal. No respiratory distress. He has no wheezes. He has no rales.  Abdominal: Soft. Bowel sounds are normal. He exhibits no distension. There is no tenderness. There is no rebound.  Neurological: He is alert and oriented to person, place, and time. Coordination normal.  Skin: Skin is warm and dry.  Vitals reviewed.  Filed Vitals:   06/25/14 1335  BP: 148/90  Pulse: 71  Temp: 98.1 F (36.7 C)  TempSrc: Oral  Resp: 18  Height: 5\' 10"  (1.778 m)  Weight: 201 lb (91.173 kg)  SpO2: 97%      Assessment & Plan:

## 2014-06-26 ENCOUNTER — Telehealth: Payer: Self-pay | Admitting: Neurology

## 2014-06-26 NOTE — Telephone Encounter (Signed)
Called [facility]) for recent download and Delana Meyer) said that patient had called for a pick up of machine on 09/03/13, not using.

## 2014-07-01 ENCOUNTER — Telehealth: Payer: Self-pay | Admitting: Neurology

## 2014-07-01 NOTE — Telephone Encounter (Signed)
Called patient concerning download  And reason he discontinue use of the CPAP,left VM message for return call

## 2014-07-02 ENCOUNTER — Ambulatory Visit: Payer: Medicare Other | Admitting: Neurology

## 2014-07-02 ENCOUNTER — Ambulatory Visit: Payer: Self-pay | Admitting: Neurology

## 2014-07-02 ENCOUNTER — Telehealth: Payer: Self-pay | Admitting: Neurology

## 2014-07-02 DIAGNOSIS — G4733 Obstructive sleep apnea (adult) (pediatric): Secondary | ICD-10-CM | POA: Diagnosis not present

## 2014-07-02 NOTE — Telephone Encounter (Signed)
Called patient but still could not reach patient to discuss his discontinue use of the CPAP and his reason for returning machine to [facility].

## 2014-07-02 NOTE — Telephone Encounter (Signed)
Patient is a no show for today's appointment 07/02/14

## 2014-07-02 NOTE — Telephone Encounter (Signed)
Patient was a no show for today's appointment 07/02/14

## 2014-07-02 NOTE — Telephone Encounter (Signed)
3rd attempt to reach patient concerning discontinue use of CPAP, lt VM message to return call to our office.

## 2014-07-02 NOTE — Telephone Encounter (Signed)
No show for today's appointment

## 2014-08-01 DIAGNOSIS — G4733 Obstructive sleep apnea (adult) (pediatric): Secondary | ICD-10-CM | POA: Diagnosis not present

## 2014-08-02 ENCOUNTER — Other Ambulatory Visit: Payer: Self-pay | Admitting: Internal Medicine

## 2014-08-02 DIAGNOSIS — G4733 Obstructive sleep apnea (adult) (pediatric): Secondary | ICD-10-CM | POA: Diagnosis not present

## 2014-08-05 ENCOUNTER — Other Ambulatory Visit: Payer: Self-pay | Admitting: Geriatric Medicine

## 2014-08-05 MED ORDER — ATENOLOL 100 MG PO TABS: 100.0000 mg | ORAL_TABLET | Freq: Every day | ORAL | Status: DC

## 2014-08-20 DIAGNOSIS — H26491 Other secondary cataract, right eye: Secondary | ICD-10-CM | POA: Diagnosis not present

## 2014-08-28 ENCOUNTER — Ambulatory Visit (INDEPENDENT_AMBULATORY_CARE_PROVIDER_SITE_OTHER): Payer: Medicare Other | Admitting: Neurology

## 2014-08-28 ENCOUNTER — Encounter: Payer: Self-pay | Admitting: Neurology

## 2014-08-28 VITALS — BP 146/92 | HR 76 | Ht 69.0 in | Wt 197.0 lb

## 2014-08-28 DIAGNOSIS — I10 Essential (primary) hypertension: Secondary | ICD-10-CM

## 2014-08-28 DIAGNOSIS — R251 Tremor, unspecified: Secondary | ICD-10-CM | POA: Diagnosis not present

## 2014-08-28 DIAGNOSIS — G252 Other specified forms of tremor: Principal | ICD-10-CM

## 2014-08-28 DIAGNOSIS — G250 Essential tremor: Secondary | ICD-10-CM

## 2014-08-28 NOTE — Progress Notes (Signed)
Subjective:    Drew Harrington was seen in consultation in the movement disorder clinic at the request of [name], MD.  The evaluation is for tremor.  He has previously seen [name] for the same.  The records that were made available to me were reviewed.  The dx given by [name] was tremor exacerbated by anxiety and enhanced physiologic tremor.  It looks like the dx of ET was entertained initially but later felt that it was likely enhanced physiologic tremor, worsened by anxiety.  The patient is a 69 y.o. right handed male with a history of tremor for 10 years.  Tremor is in both hands.  It is nowhere else.  He notices the tremor the most when typing on the computer at work.  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  There is no family hx of tremor.  He has no balance problem.    10/25/13 update:  The patient presents today for followup.  On tapering 19th, I started the patient on primidone.  The patient call me back on March 22 and we increased the dose to twice a day dosing.  He does think that it is helping, but thinks that he can still use a higher dosage.  He is not having any side effects with the medication.  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  He notes that when he is under "a high pressure situation with a doctor" he will have tremor.  His BP is under better control and he has an appt after this with his PCP to continue to work with his BP medications.  02/27/14 update:  Pt is f/u today.  Last visit, I increased his primidone to 50 mg in the AM and 100 mg at night.  He is on atenolol as well and thinks that it helps when he takes it together with the primidone.  He wonders if he can increase primidone further.  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  Continuing to work with PCP to get BP under better control but changing PCP due to retirement.  08/28/14 update:  Pt  is f/u today.  On primidone 100 mg bid.  Was hoping that it came in a 100 mg tablet and asks about that today.  He states that sometimes he takes 150 mg at night if going to work and sometimes, he takes none at all if not going to work (only works 2 nights per week).  BP under better control as works with new PCP.    Outside reports reviewed: historical medical records and referral letter/letters.  Allergies  Allergen Reactions  . Sildenafil Other (See Comments)    REACTION: Headache    Current Outpatient Prescriptions on File Prior to Visit  Medication Sig Dispense Refill  . amLODipine (NORVASC) 5 MG tablet Take 5 mg by mouth daily.   12  . atenolol (TENORMIN) 100 MG tablet Take 1 tablet (100 mg total) by mouth daily. 30 tablet 3  . losartan (COZAAR) 100 MG tablet Take 1 tablet (100 mg total) by mouth daily. 60 tablet 5  . lovastatin (MEVACOR) 40 MG tablet Take 40 mg by mouth daily.    . Multiple Vitamins-Minerals (CENTRUM SILVER PO) Take by mouth.      . primidone (MYSOLINE) 50 MG tablet Take 2 tablets (100 mg total) by  mouth 2 (two) times daily. 360 tablet 3   No current facility-administered medications on file prior to visit.    Past Medical History  Diagnosis Date  . Unspecified hemorrhoids without mention of complication   . Benign neoplasm of colon   . Psychosexual dysfunction with inhibited sexual excitement   . Insomnia, unspecified   . Allergic rhinitis   . HTN (hypertension)   . Borderline diabetes   . Personal history of colonic adenomas 04/10/2010  . Bladder cancer   . Perirectal abscess 05/10/2013    Past Surgical History  Procedure Laterality Date  . Cataract extraction w/ intraocular lens  implant, bilateral    . Transurethral resection of bladder tumor  10/08/2011    Procedure: CIRCUMCISION AND TRANSURETHRAL RESECTION OF BLADDER TUMOR (TURBT);  Surgeon: [name], MD;  Location: [facility];  Service: Urology;  Laterality: N/A;  .  Transthoracic echocardiogram  04-06-2006    LVSF NORMAL/  EF 60%/ AORTIC ROOT AT UPPER LIMITS OF NORMAL SIZE  . Transurethral resection of bladder tumor  11/30/2011    Procedure: TRANSURETHRAL RESECTION OF BLADDER TUMOR (TURBT);  Surgeon: [name], MD;  Location: [facility];  Service: Urology;  Laterality: N/A;  . Removal cyst hip Left 05/23/2013    History   Social History  . Marital Status: Married    Spouse Name: N/A  . Number of Children: N/A  . Years of Education: N/A   Occupational History  . Part Time Ortho Tech [employer]   Social History Main Topics  . Smoking status: Former Smoker    Quit date: 05/10/1993  . Smokeless tobacco: Never Used  . Alcohol Use: No  . Drug Use: No  . Sexual Activity: Yes   Other Topics Concern  . Not on file   Social History Narrative   HSG   Trained as orthopedic tech   Married-divorced; remarried '78   2 sons - '68, '79    Family Status  Relation Status Death Age  . Father Deceased 81    Lung CA  . Sister Deceased     CA, colon  . Sister Deceased     HTN  . Sister Deceased   . Mother Deceased 43    Intestinal blockage    Review of Systems A complete 10 system ROS was obtained and was negative apart from what is mentioned.   Objective:   VITALS:   Filed Vitals:   08/28/14 1006  BP: 146/92  Pulse: 76  Height: 5\' 9"  (1.753 m)  Weight: 197 lb (89.359 kg)   Gen:  Appears stated age and in NAD. HEENT:  Normocephalic, atraumatic. The mucous membranes are dry. The superficial temporal arteries are without ropiness or tenderness. Cardiovascular: Regular rate and rhythm. Lungs: Clear to auscultation bilaterally. Neck: There are no carotid bruits noted bilaterally.  NEUROLOGICAL:  Orientation:  The patient is alert and oriented x 3.  Recent and remote memory are intact.  Attention span and concentration are normal.  Able to name objects and repeat without trouble.  Fund of knowledge is  appropriate Cranial nerves: There is good facial symmetry.  Soft palate rises symmetrically and there is no tongue deviation. Hearing is intact to conversational tone. Tone: Tone is good throughout.  No rigidity. Sensation: Sensation is intact to light touch throughout Coordination:  The patient has no dysdiadichokinesia or dysmetria. Motor: Strength is 5/5 in the bilateral upper and lower extremities.  Shoulder shrug is equal bilaterally.  There is no pronator drift.  There are no fasciculations noted. Gait and Station: The patient is able to ambulate without difficulty. He does, however, have decreased arm swing on the R.    MOVEMENT EXAM: Tremor:  There is mild tremor of the outstretched hands that increases with intention.  The patient has minimal trouble with archimedes spirals.  He does have trouble with writing his name.  There is no tremor at rest.  T    LABS:  Lab Results  Component Value Date   TSH 2.09 06/23/2009   No results found for: VITAMINB12   Assessment/Plan:   1.   ET  -Primidone has helped and the pt would like to continue the 100 mg in the AM and 100-150 mg at night.  Advised not to stop this med "cold [place]."  Advised that there isn't a 100 mg tablet as he was hoping.  -talked about caffeine but has changed to all decaff.  2.  HTN  -Working closely with PCP. 3.  F/u 6 months-1 year

## 2014-08-31 DIAGNOSIS — G4733 Obstructive sleep apnea (adult) (pediatric): Secondary | ICD-10-CM | POA: Diagnosis not present

## 2014-09-02 DIAGNOSIS — G4733 Obstructive sleep apnea (adult) (pediatric): Secondary | ICD-10-CM | POA: Diagnosis not present

## 2014-09-06 DIAGNOSIS — Z961 Presence of intraocular lens: Secondary | ICD-10-CM | POA: Diagnosis not present

## 2014-09-06 DIAGNOSIS — H26492 Other secondary cataract, left eye: Secondary | ICD-10-CM | POA: Diagnosis not present

## 2014-09-06 DIAGNOSIS — H26493 Other secondary cataract, bilateral: Secondary | ICD-10-CM | POA: Diagnosis not present

## 2014-10-01 DIAGNOSIS — G4733 Obstructive sleep apnea (adult) (pediatric): Secondary | ICD-10-CM | POA: Diagnosis not present

## 2014-10-02 DIAGNOSIS — G4733 Obstructive sleep apnea (adult) (pediatric): Secondary | ICD-10-CM | POA: Diagnosis not present

## 2014-10-04 DIAGNOSIS — H26491 Other secondary cataract, right eye: Secondary | ICD-10-CM | POA: Diagnosis not present

## 2014-10-04 DIAGNOSIS — H26493 Other secondary cataract, bilateral: Secondary | ICD-10-CM | POA: Diagnosis not present

## 2014-10-18 ENCOUNTER — Other Ambulatory Visit: Payer: Self-pay

## 2014-10-18 MED ORDER — LOSARTAN POTASSIUM 100 MG PO TABS: 100.0000 mg | ORAL_TABLET | Freq: Every day | ORAL | Status: DC

## 2014-11-01 DIAGNOSIS — G4733 Obstructive sleep apnea (adult) (pediatric): Secondary | ICD-10-CM | POA: Diagnosis not present

## 2014-11-21 ENCOUNTER — Encounter: Payer: Self-pay | Admitting: Internal Medicine

## 2014-11-28 ENCOUNTER — Telehealth: Payer: Self-pay | Admitting: Neurology

## 2014-11-28 ENCOUNTER — Ambulatory Visit (INDEPENDENT_AMBULATORY_CARE_PROVIDER_SITE_OTHER): Payer: Medicare Other | Admitting: Neurology

## 2014-11-28 ENCOUNTER — Other Ambulatory Visit (INDEPENDENT_AMBULATORY_CARE_PROVIDER_SITE_OTHER): Payer: Medicare Other

## 2014-11-28 ENCOUNTER — Encounter: Payer: Self-pay | Admitting: Neurology

## 2014-11-28 VITALS — BP 170/100 | HR 70 | Ht 69.0 in | Wt 194.2 lb

## 2014-11-28 DIAGNOSIS — Z5181 Encounter for therapeutic drug level monitoring: Secondary | ICD-10-CM | POA: Diagnosis not present

## 2014-11-28 DIAGNOSIS — R251 Tremor, unspecified: Secondary | ICD-10-CM

## 2014-11-28 DIAGNOSIS — G250 Essential tremor: Secondary | ICD-10-CM

## 2014-11-28 LAB — CBC WITH DIFFERENTIAL/PLATELET
BASOS PCT: 0.6 % (ref 0.0–3.0)
Basophils Absolute: 0 10*3/uL (ref 0.0–0.1)
EOS ABS: 0.2 10*3/uL (ref 0.0–0.7)
EOS PCT: 2.6 % (ref 0.0–5.0)
HCT: 46.9 % (ref 39.0–52.0)
HEMOGLOBIN: 15.7 g/dL (ref 13.0–17.0)
LYMPHS PCT: 24.2 % (ref 12.0–46.0)
Lymphs Abs: 2 10*3/uL (ref 0.7–4.0)
MCHC: 33.4 g/dL (ref 30.0–36.0)
MCV: 90.6 fl (ref 78.0–100.0)
Monocytes Absolute: 0.6 10*3/uL (ref 0.1–1.0)
Monocytes Relative: 7.3 % (ref 3.0–12.0)
NEUTROS PCT: 65.3 % (ref 43.0–77.0)
Neutro Abs: 5.5 10*3/uL (ref 1.4–7.7)
PLATELETS: 247 10*3/uL (ref 150.0–400.0)
RBC: 5.18 Mil/uL (ref 4.22–5.81)
RDW: 15.4 % (ref 11.5–15.5)
WBC: 8.4 10*3/uL (ref 4.0–10.5)

## 2014-11-28 LAB — TSH: TSH: 1.76 u[IU]/mL (ref 0.35–4.50)

## 2014-11-28 NOTE — Addendum Note (Signed)
Addended byMargarette Asal L on: 11/28/2014 11:13 AM   Modules accepted: Orders

## 2014-11-28 NOTE — Progress Notes (Signed)
Subjective:    Drew Harrington was seen in consultation in the movement disorder clinic at the request of [name], MD.  The evaluation is for tremor.  He has previously seen [name] for the same.  The records that were made available to me were reviewed.  The dx given by [name] was tremor exacerbated by anxiety and enhanced physiologic tremor.  It looks like the dx of ET was entertained initially but later felt that it was likely enhanced physiologic tremor, worsened by anxiety.  The patient is a 69 y.o. right handed male with a history of tremor for 10 years.  Tremor is in both hands.  It is nowhere else.  He notices the tremor the most when typing on the computer at work.  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  There is no family hx of tremor.  He has no balance problem.    10/25/13 update:  The patient presents today for followup.  On tapering 19th, I started the patient on primidone.  The patient call me back on March 22 and we increased the dose to twice a day dosing.  He does think that it is helping, but thinks that he can still use a higher dosage.  He is not having any side effects with the medication.  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  He notes that when he is under "a high pressure situation with a doctor" he will have tremor.  His BP is under better control and he has an appt after this with his PCP to continue to work with his BP medications.  02/27/14 update:  Pt is f/u today.  Last visit, I increased his primidone to 50 mg in the AM and 100 mg at night.  He is on atenolol as well and thinks that it helps when he takes it together with the primidone.  He wonders if he can increase primidone further.  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  Continuing to work with PCP to get BP under better control but changing PCP due to retirement.  08/28/14 update:  Pt  is f/u today.  On primidone 100 mg bid.  Was hoping that it came in a 100 mg tablet and asks about that today.  He states that sometimes he takes 150 mg at night if going to work and sometimes, he takes none at all if not going to work (only works 2 nights per week).  BP under better control as works with new PCP.    11/28/14 update:  Returns earlier than expected.  Doing pretty well with tremor.  Still working with BP to get under control but just took med this AM.  Watching diet better and trying to lose weight as doesn't want to be put on meds for DM.  Outside reports reviewed: historical medical records and referral letter/letters.  Allergies  Allergen Reactions  . Sildenafil Other (See Comments)    REACTION: Headache    Current Outpatient Prescriptions on File Prior to Visit  Medication Sig Dispense Refill  . amLODipine (NORVASC) 5 MG tablet Take 5 mg by mouth daily.   12  . atenolol (TENORMIN) 100 MG tablet Take 1 tablet (100 mg total) by mouth daily. 30 tablet 3  . losartan (COZAAR) 100 MG tablet Take 1 tablet (100 mg total) by  mouth daily. 60 tablet 5  . lovastatin (MEVACOR) 40 MG tablet Take 40 mg by mouth daily.    . Multiple Vitamins-Minerals (CENTRUM SILVER PO) Take by mouth.      . primidone (MYSOLINE) 50 MG tablet Take 2 tablets (100 mg total) by mouth 2 (two) times daily. 360 tablet 3   No current facility-administered medications on file prior to visit.    Past Medical History  Diagnosis Date  . Unspecified hemorrhoids without mention of complication   . Benign neoplasm of colon   . Psychosexual dysfunction with inhibited sexual excitement   . Insomnia, unspecified   . Allergic rhinitis   . HTN (hypertension)   . Borderline diabetes   . Personal history of colonic adenomas 04/10/2010  . Bladder cancer   . Perirectal abscess 05/10/2013    Past Surgical History  Procedure Laterality Date  . Cataract extraction w/ intraocular lens  implant, bilateral    .  Transurethral resection of bladder tumor  10/08/2011    Procedure: CIRCUMCISION AND TRANSURETHRAL RESECTION OF BLADDER TUMOR (TURBT);  Surgeon: [name], MD;  Location: [facility];  Service: Urology;  Laterality: N/A;  . Transthoracic echocardiogram  04-06-2006    LVSF NORMAL/  EF 60%/ AORTIC ROOT AT UPPER LIMITS OF NORMAL SIZE  . Transurethral resection of bladder tumor  11/30/2011    Procedure: TRANSURETHRAL RESECTION OF BLADDER TUMOR (TURBT);  Surgeon: [name], MD;  Location: [facility];  Service: Urology;  Laterality: N/A;  . Removal cyst hip Left 05/23/2013    History   Social History  . Marital Status: Married    Spouse Name: N/A  . Number of Children: N/A  . Years of Education: N/A   Occupational History  . Part Time Ortho Tech [employer]   Social History Main Topics  . Smoking status: Former Smoker    Quit date: 05/10/1993  . Smokeless tobacco: Never Used  . Alcohol Use: No  . Drug Use: No  . Sexual Activity: Yes   Other Topics Concern  . Not on file   Social History Narrative   HSG   Trained as orthopedic tech   Married-divorced; remarried '78   2 sons - '68, '79    Family Status  Relation Status Death Age  . Father Deceased 19    Lung CA  . Sister Deceased     CA, colon  . Sister Deceased     HTN  . Sister Deceased   . Mother Deceased 9    Intestinal blockage    Review of Systems A complete 10 system ROS was obtained and was negative apart from what is mentioned.   Objective:   VITALS:   Filed Vitals:   11/28/14 0916  BP: 170/100  Pulse: 70  Height: '5\' 9"'$  (1.753 m)  Weight: 194 lb 3 oz (88.083 kg)  SpO2: 96%   Wt Readings from Last 3 Encounters:  11/28/14 194 lb 3 oz (88.083 kg)  08/28/14 197 lb (89.359 kg)  06/25/14 201 lb (91.173 kg)     Gen:  Appears stated age and in NAD. HEENT:  Normocephalic, atraumatic. The mucous membranes are dry. The superficial temporal arteries  are without ropiness or tenderness. Cardiovascular: Regular rate and rhythm. Lungs: Clear to auscultation bilaterally. Neck: There are no carotid bruits noted bilaterally.  NEUROLOGICAL:  Orientation:  The patient is alert and oriented x 3.  Recent and remote memory are intact.  Attention span and concentration are  normal.  Able to name objects and repeat without trouble.  Fund of knowledge is appropriate Cranial nerves: There is good facial symmetry.  Soft palate rises symmetrically and there is no tongue deviation. Hearing is intact to conversational tone. Tone: Tone is good throughout.  No rigidity. Sensation: Sensation is intact to light touch throughout Coordination:  The patient has no dysdiadichokinesia or dysmetria. Motor: Strength is 5/5 in the bilateral upper and lower extremities.  Shoulder shrug is equal bilaterally.  There is no pronator drift.  There are no fasciculations noted. Gait and Station: The patient is able to ambulate without difficulty. He ambulates in tandem fashion without trouble.  MOVEMENT EXAM: Tremor:  There is mild tremor of the outstretched hands that increases with intention.  The patient has minimal trouble with archimedes spirals. Minimal trouble with writing a sentence.    LABS:  Lab Results  Component Value Date   TSH 2.09 06/23/2009   No results found for: [facility]    Chemistry      Component Value Date/Time   NA 139 03/12/2014 0847   K 3.4* 03/12/2014 0847   CL 101 03/12/2014 0847   CO2 31 03/12/2014 0847   BUN 13 03/12/2014 0847   CREATININE 1.2 03/12/2014 0847      Component Value Date/Time   CALCIUM 9.4 03/12/2014 0847   ALKPHOS 115 01/10/2013 1050   AST 19 01/10/2013 1050   ALT 30 01/10/2013 1050   BILITOT 0.5 01/10/2013 1050     Lab Results  Component Value Date   WBC 8.3 06/23/2009   HGB 16.0 11/30/2011   HCT 47.0 11/30/2011   MCV 93.0 06/23/2009   PLT 241.0 06/23/2009     Assessment/Plan:   1.   ET  -Primidone  has helped and the pt would like to continue the 100 mg in the AM and 100 at night.  -needs TSH and CBC.  Will call with results 2.  HTN  -Working closely with PCP.  Congratulated on weight loss and encouraged to continue to focus on diet/exercise. 3.  F/u 6 months-1 year

## 2014-11-28 NOTE — Addendum Note (Signed)
Addended byAnnamaria Helling on: 11/28/2014 10:16 AM   Modules accepted: Orders

## 2014-11-28 NOTE — Telephone Encounter (Signed)
Tried to call patient with results. No answer and no voicemail.

## 2014-11-28 NOTE — Telephone Encounter (Signed)
-----   Message from [place], DO sent at 11/28/2014  1:16 PM EDT ----- Please let pt know that his thyroid and cbc looked good

## 2014-11-29 NOTE — Telephone Encounter (Signed)
Letter mailed to patient.

## 2014-12-03 ENCOUNTER — Other Ambulatory Visit: Payer: Self-pay | Admitting: Internal Medicine

## 2014-12-04 ENCOUNTER — Other Ambulatory Visit: Payer: Self-pay | Admitting: Internal Medicine

## 2015-01-09 ENCOUNTER — Other Ambulatory Visit: Payer: Self-pay | Admitting: Neurology

## 2015-01-09 NOTE — Telephone Encounter (Signed)
Primidone refill requested. Per last office note- patient to remain on medication. Refill approved and sent to patient's pharmacy.   

## 2015-04-09 ENCOUNTER — Other Ambulatory Visit: Payer: Self-pay | Admitting: Internal Medicine

## 2015-07-09 ENCOUNTER — Other Ambulatory Visit: Payer: Self-pay | Admitting: Internal Medicine

## 2015-08-25 ENCOUNTER — Other Ambulatory Visit: Payer: Self-pay | Admitting: Internal Medicine

## 2015-11-10 ENCOUNTER — Other Ambulatory Visit: Payer: Self-pay | Admitting: Neurology

## 2015-11-10 NOTE — Telephone Encounter (Signed)
Primidone refill requested. Per last office note- patient to remain on medication. Refill approved and sent to patient's pharmacy.   

## 2015-11-10 NOTE — Telephone Encounter (Signed)
RX changed to 90 day supply.

## 2015-11-26 ENCOUNTER — Other Ambulatory Visit: Payer: Self-pay | Admitting: Internal Medicine

## 2015-11-27 ENCOUNTER — Other Ambulatory Visit: Payer: Self-pay | Admitting: Internal Medicine

## 2015-12-18 ENCOUNTER — Other Ambulatory Visit: Payer: Self-pay | Admitting: Internal Medicine

## 2016-01-12 ENCOUNTER — Other Ambulatory Visit: Payer: Self-pay | Admitting: Neurology

## 2016-01-12 NOTE — Telephone Encounter (Signed)
Primidone refill requested. Per last office note- patient to remain on medication. Refill approved for one month supply only with note that patient needs appt for further refills and sent to patient's pharmacy.

## 2016-01-17 ENCOUNTER — Other Ambulatory Visit: Payer: Self-pay | Admitting: Neurology

## 2016-01-29 NOTE — Progress Notes (Signed)
Subjective:    Drew Harrington was seen in consultation in the movement disorder clinic at the request of Hoyt Koch, MD.  The evaluation is for tremor.  He has previously seen [name] for the same.  The records that were made available to me were reviewed.  The dx given by [name] was tremor exacerbated by anxiety and enhanced physiologic tremor.  It looks like the dx of ET was entertained initially but later felt that it was likely enhanced physiologic tremor, worsened by anxiety.  The patient is a 70 y.o. right handed male with a history of tremor for 10 years.  Tremor is in both hands.  It is nowhere else.  He notices the tremor the most when typing on the computer at work.  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  There is no family hx of tremor.  He has no balance problem.    10/25/13 update:  The patient presents today for followup.  On tapering 19th, I started the patient on primidone.  The patient call me back on March 22 and we increased the dose to twice a day dosing.  He does think that it is helping, but thinks that he can still use a higher dosage.  He is not having any side effects with the medication.  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  He notes that when he is under "a high pressure situation with a doctor" he will have tremor.  His BP is under better control and he has an appt after this with his PCP to continue to work with his BP medications.  02/27/14 update:  Pt is f/u today.  Last visit, I increased his primidone to 50 mg in the AM and 100 mg at night.  He is on atenolol as well and thinks that it helps when he takes it together with the primidone.  He wonders if he can increase primidone further.  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  Continuing to work with PCP to get BP under better control but changing PCP due to retirement.  08/28/14 update:   Pt is f/u today.  On primidone 100 mg bid.  Was hoping that it came in a 100 mg tablet and asks about that today.  He states that sometimes he takes 150 mg at night if going to work and sometimes, he takes none at all if not going to work (only works 2 nights per week).  BP under better control as works with new PCP.    11/28/14 update:  Returns earlier than expected.  Doing pretty well with tremor.  Still working with BP to get under control but just took med this AM.  Watching diet better and trying to lose weight as doesn't want to be put on meds for DM.  01/30/16 update:  The patient returns today for follow-up.  I have not seen him in over a year.  When I last saw him, he was on primidone, 100 mg bid and he ended up going up on it to 250 mg q hs.  "I think I need something stronger."  He states that it affects his writing and if he is handing someone something, he will note tremor.  He notes it is worse with anxiety.  States that he is still on atenolol 100 mg daily  for BP but doesn't appear that he has actually seen PCP since 2015.    Outside reports reviewed: historical medical records and referral letter/letters.  Allergies  Allergen Reactions  . Sildenafil Other (See Comments)    REACTION: Headache    Current Outpatient Prescriptions on File Prior to Visit  Medication Sig Dispense Refill  . amLODipine (NORVASC) 5 MG tablet Take 5 mg by mouth daily.   12  . atenolol (TENORMIN) 100 MG tablet TAKE 1 TABLET(100 MG) BY MOUTH DAILY 45 tablet 0  . losartan (COZAAR) 100 MG tablet TAKE 1 TABLET(100 MG) BY MOUTH DAILY 90 tablet 0  . lovastatin (MEVACOR) 40 MG tablet Take 40 mg by mouth daily.    . metFORMIN (GLUCOPHAGE) 500 MG tablet TAKE 1 TABLET BY MOUTH TWICE DAILY WITH A MEAL 180 tablet 0  . Multiple Vitamins-Minerals (CENTRUM SILVER PO) Take by mouth.      . primidone (MYSOLINE) 50 MG tablet TAKE 2 TABLETS BY MOUTH TWICE DAILY (Patient taking differently: 5 tablets once daily) 120 tablet 0    No current facility-administered medications on file prior to visit.     Past Medical History:  Diagnosis Date  . Allergic rhinitis   . Benign neoplasm of colon   . Bladder cancer ([place])   . Borderline diabetes   . HTN (hypertension)   . Insomnia, unspecified   . Perirectal abscess 05/10/2013  . Personal history of colonic adenomas 04/10/2010  . Psychosexual dysfunction with inhibited sexual excitement   . Unspecified hemorrhoids without mention of complication     Past Surgical History:  Procedure Laterality Date  . CATARACT EXTRACTION W/ INTRAOCULAR LENS  IMPLANT, BILATERAL    . removal cyst hip Left 05/23/2013  . TRANSTHORACIC ECHOCARDIOGRAM  04-06-2006   LVSF NORMAL/  EF 60%/ AORTIC ROOT AT UPPER LIMITS OF NORMAL SIZE  . TRANSURETHRAL RESECTION OF BLADDER TUMOR  10/08/2011   Procedure: CIRCUMCISION AND TRANSURETHRAL RESECTION OF BLADDER TUMOR (TURBT);  Surgeon: [name], MD;  Location: [facility];  Service: Urology;  Laterality: N/A;  . TRANSURETHRAL RESECTION OF BLADDER TUMOR  11/30/2011   Procedure: TRANSURETHRAL RESECTION OF BLADDER TUMOR (TURBT);  Surgeon: [name], MD;  Location: [facility];  Service: Urology;  Laterality: N/A;    Social History   Social History  . Marital status: Married    Spouse name: N/A  . Number of children: N/A  . Years of education: N/A   Occupational History  . Part Time Ortho Tech [employer]   Social History Main Topics  . Smoking status: Former Smoker    Quit date: 05/10/1993  . Smokeless tobacco: Never Used  . Alcohol use No  . Drug use: No  . Sexual activity: Yes   Other Topics Concern  . Not on file   Social History Narrative   [employer]   Trained as orthopedic tech   Married-divorced; remarried '78   2 sons - '68, '79    Family Status  Relation Status  . Father Deceased at age 73   Lung CA  . Sister Deceased   CA, colon  . Sister Deceased   HTN  . Sister  Deceased  . Mother Deceased at age 47   Intestinal blockage    Review of Systems A complete 10 system ROS was obtained and was negative apart from what is mentioned.   Objective:   VITALS:   Vitals:   01/30/16 1416  BP: (!) 160/94  BP Location:  Left Arm  Patient Position: Sitting  Cuff Size: Normal  Pulse: 76  Weight: 199 lb (90.3 kg)  Height: '5\' 10"'$  (1.778 m)   Wt Readings from Last 3 Encounters:  01/30/16 199 lb (90.3 kg)  11/28/14 194 lb 3 oz (88.1 kg)  08/28/14 197 lb (89.4 kg)     Gen:  Appears stated age and in NAD. HEENT:  Normocephalic, atraumatic. The mucous membranes are dry. The superficial temporal arteries are without ropiness or tenderness. Cardiovascular: Regular rate and rhythm. Lungs: Clear to auscultation bilaterally. Neck: There are no carotid bruits noted bilaterally.  NEUROLOGICAL:  Orientation:  The patient is alert and oriented x 3.  Recent and remote memory are intact.  Attention span and concentration are normal.  Able to name objects and repeat without trouble.  Fund of knowledge is appropriate Cranial nerves: There is good facial symmetry.  Soft palate rises symmetrically and there is no tongue deviation. Hearing is intact to conversational tone. Tone: Tone is good throughout.  No rigidity. Sensation: Sensation is intact to light touch throughout Coordination:  The patient has no dysdiadichokinesia or dysmetria. Motor: Strength is 5/5 in the bilateral upper and lower extremities.  Shoulder shrug is equal bilaterally.  There is no pronator drift.  There are no fasciculations noted. Gait and Station: The patient is able to ambulate without difficulty.   MOVEMENT EXAM: Tremor:  There is mild tremor of the outstretched hands that increases with intention.  Tremor not increased when holding an object of weight in the hand.  The patient has minimal trouble with archimedes spirals. Minimal trouble with writing a sentence.    LABS:  Lab Results    Component Value Date   TSH 1.76 11/28/2014   No results found for: [facility]    Chemistry      Component Value Date/Time   NA 139 03/12/2014 0847   K 3.4 (L) 03/12/2014 0847   CL 101 03/12/2014 0847   CO2 31 03/12/2014 0847   BUN 13 03/12/2014 0847   CREATININE 1.2 03/12/2014 0847      Component Value Date/Time   CALCIUM 9.4 03/12/2014 0847   ALKPHOS 115 01/10/2013 1050   AST 19 01/10/2013 1050   ALT 30 01/10/2013 1050   BILITOT 0.5 01/10/2013 1050     Lab Results  Component Value Date   WBC 8.4 11/28/2014   HGB 15.7 11/28/2014   HCT 46.9 11/28/2014   MCV 90.6 11/28/2014   PLT 247.0 11/28/2014     Assessment/Plan:   1.   ET  -Pt Increased primidone on his own from 100 mg twice a day to 250 mg at night.  Explained to him that he really needed to let me know.  He can continue that dosage, but I do not want to go up further on that.  He is unsatisfied with tremor control even at this dosage.  -States that he is still taking his atenolol.  Wonder effectively changed to Inderal LA, 120 mg daily.  I will ask his primary care physician, although he has not seen her since 2015.  His blood pressure remains out of control, although when I talked to him about this, he remains in denial about it.  2.  HTN  -needs PCP follow up 3.  Will call him next week after I hear back from PCP.  Much greater than 50% of this visit was spent in counseling and coordinating care.  Total face to face time:  25 min

## 2016-01-30 ENCOUNTER — Encounter: Payer: Self-pay | Admitting: Neurology

## 2016-01-30 ENCOUNTER — Ambulatory Visit (INDEPENDENT_AMBULATORY_CARE_PROVIDER_SITE_OTHER): Payer: Medicare Other | Admitting: Neurology

## 2016-01-30 VITALS — BP 160/94 | HR 76 | Ht 70.0 in | Wt 199.0 lb

## 2016-01-30 DIAGNOSIS — G250 Essential tremor: Secondary | ICD-10-CM

## 2016-01-30 DIAGNOSIS — I10 Essential (primary) hypertension: Secondary | ICD-10-CM

## 2016-02-03 ENCOUNTER — Telehealth: Payer: Self-pay | Admitting: Neurology

## 2016-02-03 MED ORDER — PROPRANOLOL HCL ER 120 MG PO CP24: 120.0000 mg | ORAL_CAPSULE | Freq: Every day | ORAL | 1 refills | Status: DC

## 2016-02-03 NOTE — Telephone Encounter (Signed)
Already spoke with patient

## 2016-02-03 NOTE — Telephone Encounter (Signed)
Let pt know that I talked with Dr. [name] and okay to change atenolol to inderal LA 120 mg daily but he needs to f/u with her in next 2 months as he hasn't seen her since 2015 and this med is being used for BP control and his BP wasn't well controlled.  Call in 1 month with one RF only and tell him needs f/u with her before can refill it.

## 2016-02-03 NOTE — Telephone Encounter (Signed)
PT said he was returning your call/Dawn [phone]

## 2016-02-03 NOTE — Telephone Encounter (Signed)
Left message on machine for patient to call back.

## 2016-02-03 NOTE — Telephone Encounter (Signed)
Patient made aware. RX sent to pharmacy.

## 2016-02-04 ENCOUNTER — Telehealth: Payer: Self-pay | Admitting: Neurology

## 2016-02-04 ENCOUNTER — Other Ambulatory Visit: Payer: Self-pay | Admitting: Neurology

## 2016-02-04 NOTE — Telephone Encounter (Signed)
Called and cancelled 90 day supply with pharmacy.

## 2016-02-04 NOTE — Telephone Encounter (Signed)
-----   Message from [place], DO sent at 02/04/2016 12:14 PM EDT ----- I didn't want a 90 day supply with 1 refill (sorry).  Wanted a 30 day supply with 1 refill or he won't follow up.  If he needs 90 day supply, then no refills til f/u with PCP.  Thx

## 2016-02-20 ENCOUNTER — Other Ambulatory Visit: Payer: Self-pay | Admitting: Neurology

## 2016-03-04 ENCOUNTER — Other Ambulatory Visit: Payer: Self-pay | Admitting: Internal Medicine

## 2016-03-16 ENCOUNTER — Other Ambulatory Visit: Payer: Self-pay | Admitting: Internal Medicine

## 2016-04-15 ENCOUNTER — Other Ambulatory Visit: Payer: Self-pay | Admitting: Neurology

## 2016-05-06 ENCOUNTER — Other Ambulatory Visit (INDEPENDENT_AMBULATORY_CARE_PROVIDER_SITE_OTHER): Payer: Medicare Other

## 2016-05-06 ENCOUNTER — Ambulatory Visit (INDEPENDENT_AMBULATORY_CARE_PROVIDER_SITE_OTHER): Payer: Medicare Other | Admitting: Internal Medicine

## 2016-05-06 ENCOUNTER — Encounter: Payer: Self-pay | Admitting: Internal Medicine

## 2016-05-06 VITALS — BP 230/128 | HR 101 | Temp 98.4°F | Resp 14 | Ht 69.0 in | Wt 202.0 lb

## 2016-05-06 DIAGNOSIS — I10 Essential (primary) hypertension: Secondary | ICD-10-CM

## 2016-05-06 DIAGNOSIS — Z1159 Encounter for screening for other viral diseases: Secondary | ICD-10-CM

## 2016-05-06 DIAGNOSIS — E785 Hyperlipidemia, unspecified: Secondary | ICD-10-CM

## 2016-05-06 DIAGNOSIS — E119 Type 2 diabetes mellitus without complications: Secondary | ICD-10-CM | POA: Diagnosis not present

## 2016-05-06 DIAGNOSIS — E1169 Type 2 diabetes mellitus with other specified complication: Secondary | ICD-10-CM | POA: Diagnosis not present

## 2016-05-06 LAB — CBC
HCT: 47.7 % (ref 39.0–52.0)
HEMOGLOBIN: 16.1 g/dL (ref 13.0–17.0)
MCHC: 33.7 g/dL (ref 30.0–36.0)
MCV: 91 fl (ref 78.0–100.0)
PLATELETS: 261 10*3/uL (ref 150.0–400.0)
RBC: 5.24 Mil/uL (ref 4.22–5.81)
RDW: 14.4 % (ref 11.5–15.5)
WBC: 8.1 10*3/uL (ref 4.0–10.5)

## 2016-05-06 LAB — COMPREHENSIVE METABOLIC PANEL
ALK PHOS: 111 U/L (ref 39–117)
ALT: 18 U/L (ref 0–53)
AST: 14 U/L (ref 0–37)
Albumin: 4.3 g/dL (ref 3.5–5.2)
BILIRUBIN TOTAL: 0.4 mg/dL (ref 0.2–1.2)
BUN: 14 mg/dL (ref 6–23)
CALCIUM: 10.2 mg/dL (ref 8.4–10.5)
CO2: 32 mEq/L (ref 19–32)
Chloride: 102 mEq/L (ref 96–112)
Creatinine, Ser: 1.26 mg/dL (ref 0.40–1.50)
GFR: 72.52 mL/min (ref >60.00)
GLUCOSE: 123 mg/dL — AB (ref 70–99)
POTASSIUM: 3.8 meq/L (ref 3.5–5.1)
Sodium: 140 mEq/L (ref 135–145)
TOTAL PROTEIN: 7.6 g/dL (ref 6.0–8.3)

## 2016-05-06 LAB — MICROALBUMIN / CREATININE URINE RATIO
CREATININE, U: 66.3 mg/dL
MICROALB/CREAT RATIO: 10.9 mg/g (ref 0.0–30.0)
Microalb, Ur: 7.2 mg/dL — ABNORMAL HIGH (ref 0.0–1.9)

## 2016-05-06 LAB — TROPONIN I: TNIDX: 0 ug/l (ref 0.00–0.06)

## 2016-05-06 LAB — LIPID PANEL
CHOLESTEROL: 238 mg/dL — AB (ref 0–200)
HDL: 48.1 mg/dL (ref >39.00)
LDL Cholesterol: 161 mg/dL — ABNORMAL HIGH (ref 0–99)
NonHDL: 189.95
TRIGLYCERIDES: 143 mg/dL (ref 0.0–149.0)
Total CHOL/HDL Ratio: 5
VLDL: 28.6 mg/dL (ref 0.0–40.0)

## 2016-05-06 LAB — HEMOGLOBIN A1C: Hgb A1c MFr Bld: 7 % — ABNORMAL HIGH (ref 4.6–6.5)

## 2016-05-06 MED ORDER — OLMESARTAN-AMLODIPINE-HCTZ 40-10-25 MG PO TABS: 1.0000 | ORAL_TABLET | Freq: Every day | ORAL | 2 refills | Status: DC

## 2016-05-06 NOTE — Assessment & Plan Note (Signed)
No lipid panel as he did not get the last one ordered. Checking lipid panel as last LDL was way above goal. Taking lovastatin 40 mg daily and if LDL >100 needs different agent due to high risk of CV (66% over 10 years). Talked to him about this serious risk.

## 2016-05-06 NOTE — Progress Notes (Signed)
Pre visit review using our clinic review tool, if applicable. No additional management support is needed unless otherwise documented below in the visit note. 

## 2016-05-06 NOTE — Progress Notes (Signed)
   Subjective:    Patient ID: Drew Harrington, male    DOB: 1945-07-11, 70 y.o.   MRN: [id number]  HPI The patient is a 70 YO man coming in for follow up of his medical conditions. He has not been seen in almost 2 years. His blood pressure (taking amlodipine (from unknown provider as we are not prescribing) and losartan and propranolol, BP mostly 170s to 180s most of the time at home, he does get some headaches, no chest pains, no SOB, no abdominal pain or nausea), his diabetes (he was told by prior provider that he was borderline and did not realize he was diabetic while we were discussing, this came as a shock to him, not exercising, taking metformin only once a day 500 mg), and his cholesterol (he is taking lovastatin, previous cholesterol from many years ago as he did not go for labs last time, no side effects from the medications). No visit in almost 2 years.   Review of Systems  Constitutional: Positive for activity change. Negative for appetite change, chills, fatigue, fever and unexpected weight change.  HENT: Negative.   Eyes: Negative.   Respiratory: Negative for cough and shortness of breath.   Cardiovascular: Negative for chest pain, palpitations and leg swelling.  Gastrointestinal: Negative for abdominal distention, abdominal pain, constipation, diarrhea and nausea.  Musculoskeletal: Negative for arthralgias, back pain, myalgias and neck pain.  Neurological: Positive for headaches. Negative for dizziness, syncope, speech difficulty, weakness and numbness.  Hematological: Negative.   Psychiatric/Behavioral: Negative.       Objective:   Physical Exam  Constitutional: He is oriented to person, place, and time. He appears well-developed and well-nourished.  HENT:  Head: Normocephalic and atraumatic.  Eyes: EOM are normal.  Neck: Normal range of motion.  Cardiovascular: Normal rate and regular rhythm.   Pulmonary/Chest: Effort normal. No respiratory distress. He has no wheezes. He  has no rales.  Abdominal: Soft. Bowel sounds are normal. He exhibits no distension. There is no tenderness. There is no rebound.  Musculoskeletal: He exhibits no edema.  Neurological: He is alert and oriented to person, place, and time. Coordination normal.  Skin: Skin is warm and dry.  Psychiatric: He has a normal mood and affect.   Vitals:   05/06/16 0832 05/06/16 0916  BP: (!) 212/122 (!) 230/128  Pulse: (!) 101   Resp: 14   Temp: 98.4 F (36.9 C)   TempSrc: Oral   SpO2: 98%   Weight: 202 lb (91.6 kg)   Height: '5\' 9"'$  (1.753 m)    EKG: Rate 95, sinus, left axis, intervals normal, LVH with some not significant ST depression in lead 2, changes consistent with LAFB. This represents a change from 2013    Assessment & Plan:

## 2016-05-06 NOTE — Assessment & Plan Note (Signed)
No HgA1c in almost 2 years for lack of follow up. Complications unknown since no eye exam or urine screening. Foot exam done today. Counseled about the need for eye exam and ordered microalbumin to creatinine ratio. Checking HgA1c. He is not taking his medications correctly and is only taking metformin 500 mg daily instead of BID. Adjust as needed based on HgA1c. He was not aware that he had diabetes although this is documented back to 2014. Prior provider told him he was borderline and thus he did not take this seriously. All his siblings have type 2 diabetes.

## 2016-05-06 NOTE — Patient Instructions (Addendum)
We have changed the blood pressure medicine into just 2 pills.   1. Take olmesartan/amlodipine/hctz 1 pill daily (this is a combo pill).   2. Continue to follow with neurology to adjust the propanolol 1 pill daily (for now).  There are some changes in the EKG from last time which are likely from the blood pressure not being controlled in some time.   We need to see you back in 1 week for a nurse visit to check the blood pressure and 1 month to check the blood pressure. You have a 66% risk of heart attack or stroke so we need to bring the blood pressure down.   We are checking the sugars today and we may need to adjust the cholesterol or sugar medicine. Treating these well can also reduce your risk of heart attack and stroke.   You do have diabetes and have had it for some years. You need to get an eye exam every year to check for changes. These changes can lead to blindness if not treated. Getting the yearly eye exam means you can get treatment before you get vision loss.   Diabetes and Standards of Medical Care Diabetes is complicated. You may find that your diabetes team includes a dietitian, nurse, diabetes educator, eye doctor, and more. To help everyone know what is going on and to help you get the care you deserve, the following schedule of care was developed to help keep you on track. Below are the tests, exams, vaccines, medicines, education, and plans you will need. HbA1c test This test shows how well you have controlled your glucose over the past 2-3 months. It is used to see if your diabetes management plan needs to be adjusted.   It is performed at least 2 times a year if you are meeting treatment goals.  It is performed 4 times a year if therapy has changed or if you are not meeting treatment goals. Blood pressure test  This test is performed at every routine medical visit. The goal is less than 140/90 mm Hg for most people, but 130/80 mm Hg in some cases. Ask your health care  provider about your goal. Dental exam  Follow up with the dentist regularly. Eye exam  If you are diagnosed with type 1 diabetes as a child, get an exam upon reaching the age of 39 years or older and having had diabetes for 3-5 years. Yearly eye exams are recommended after that initial eye exam.  If you are diagnosed with type 1 diabetes as an adult, get an exam within 5 years of diagnosis and then yearly.  If you are diagnosed with type 2 diabetes, get an exam as soon as possible after the diagnosis and then yearly. Foot care exam  Visual foot exams are performed at every routine medical visit. The exams check for cuts, injuries, or other problems with the feet.  You should have a complete foot exam performed every year. This exam includes an inspection of the structure and skin of your feet, a check of the pulses in your feet, and a check of the sensation in your feet.  Type 1 diabetes: The first exam is performed 5 years after diagnosis.  Type 2 diabetes: The first exam is performed at the time of diagnosis.  Check your feet nightly for cuts, injuries, or other problems with your feet. Tell your health care provider if anything is not healing. Kidney function test (urine microalbumin)  This test is performed once a  year.  Type 1 diabetes: The first test is performed 5 years after diagnosis.  Type 2 diabetes: The first test is performed at the time of diagnosis.  A serum creatinine and estimated glomerular filtration rate (eGFR) test is done once a year to assess the level of chronic kidney disease (CKD), if present. Lipid profile (cholesterol, HDL, LDL, triglycerides)  Performed every 5 years for most people.  The goal for LDL is less than 100 mg/dL. If you are at high risk, the goal is less than 70 mg/dL.  The goal for HDL is 40 mg/dL-50 mg/dL for men and 50 mg/dL-60 mg/dL for women. An HDL cholesterol of 60 mg/dL or higher gives some protection against heart disease.  The  goal for triglycerides is less than 150 mg/dL. Immunizations  The flu (influenza) vaccine is recommended yearly for every person 14 months of age or older who has diabetes.  The pneumonia (pneumococcal) vaccine is recommended for every person 70 years of age or older who has diabetes. Adults 14 years of age or older may receive the pneumonia vaccine as a series of two separate shots.  The hepatitis B vaccine is recommended for adults shortly after they have been diagnosed with diabetes.  The Tdap (tetanus, diphtheria, and pertussis) vaccine should be given:  According to normal childhood vaccination schedules, for children.  Every 10 years, for adults who have diabetes. Diabetes self-management education  Education is recommended at diagnosis and ongoing as needed. Treatment plan  Your treatment plan is reviewed at every medical visit.   This information is not intended to replace advice given to you by your health care provider. Make sure you discuss any questions you have with your health care provider.   Document Released: 04/11/2009 Document Revised: 07/05/2014 Document Reviewed: 11/14/2012 Elsevier Interactive Patient Education [employer].

## 2016-05-06 NOTE — Assessment & Plan Note (Signed)
BP is not well controlled and there are new EKG changes from 2013. Will increase amlodipine to 10 mg daily, change losartan to olmesartan due to combo pill and add hctz 25 mg daily. Continue propranolol 120 mg daily. BPs at home 170-180s and here much higher. Without consistent care over the years recently. Checking CMP, troponin. Needs BP check in 1 week. No symptoms today of hypertensive urgency. Talked to him about the serious risk of heart attack of 66% over 10 years due to his blood pressure, cholesterol, diabetes.

## 2016-05-07 ENCOUNTER — Telehealth: Payer: Self-pay | Admitting: *Deleted

## 2016-05-07 LAB — HEPATITIS C ANTIBODY: HCV AB: NEGATIVE

## 2016-05-07 NOTE — Telephone Encounter (Signed)
Rec'd call from pt wanting to verify which med he suppose to taking. Went over MD notes from ov. He is wanting to know if need to take the Lovastatin...Drew Harrington

## 2016-05-07 NOTE — Telephone Encounter (Signed)
We have not reviewed his labs yet and would need to do this before we can verify his meds since he had not been in for 2 years.

## 2016-05-07 NOTE — Telephone Encounter (Signed)
Notified pt w/MD response.../lmb 

## 2016-05-10 ENCOUNTER — Telehealth: Payer: Self-pay | Admitting: Geriatric Medicine

## 2016-05-10 MED ORDER — AMLODIPINE BESYLATE 10 MG PO TABS: 10.0000 mg | ORAL_TABLET | Freq: Every day | ORAL | 3 refills | Status: DC

## 2016-05-10 MED ORDER — LOSARTAN POTASSIUM-HCTZ 100-25 MG PO TABS: 1.0000 | ORAL_TABLET | Freq: Every day | ORAL | 3 refills | Status: DC

## 2016-05-10 NOTE — Telephone Encounter (Signed)
Patient aware.

## 2016-05-10 NOTE — Telephone Encounter (Signed)
Patient says the olmesartan-amlodipine is going to cost him  120.00. He wants to go back to taking what he was taking before because he can't afford the new medication.

## 2016-05-10 NOTE — Telephone Encounter (Signed)
Okay, he is going to have to take 2 medications. He will take the amlodipine and the losartan he was taking before.

## 2016-05-11 ENCOUNTER — Other Ambulatory Visit: Payer: Self-pay | Admitting: Internal Medicine

## 2016-05-11 MED ORDER — SIMVASTATIN 20 MG PO TABS: 20.0000 mg | ORAL_TABLET | Freq: Every day | ORAL | 3 refills | Status: DC

## 2016-05-13 ENCOUNTER — Encounter: Payer: Self-pay | Admitting: Neurology

## 2016-05-13 ENCOUNTER — Ambulatory Visit: Payer: Medicare Other | Admitting: General Practice

## 2016-05-13 ENCOUNTER — Ambulatory Visit (INDEPENDENT_AMBULATORY_CARE_PROVIDER_SITE_OTHER): Payer: Medicare Other | Admitting: Neurology

## 2016-05-13 VITALS — BP 144/98 | HR 93 | Ht 69.0 in | Wt 200.0 lb

## 2016-05-13 VITALS — BP 170/110

## 2016-05-13 DIAGNOSIS — G250 Essential tremor: Secondary | ICD-10-CM | POA: Diagnosis not present

## 2016-05-13 DIAGNOSIS — Z0130 Encounter for examination of blood pressure without abnormal findings: Secondary | ICD-10-CM

## 2016-05-13 MED ORDER — PRIMIDONE 50 MG PO TABS: 100.0000 mg | ORAL_TABLET | Freq: Every day | ORAL | 5 refills | Status: DC

## 2016-05-13 MED ORDER — PRIMIDONE 250 MG PO TABS: 250.0000 mg | ORAL_TABLET | Freq: Every day | ORAL | 5 refills | Status: DC

## 2016-05-13 NOTE — Progress Notes (Signed)
Subjective:    Drew Harrington was seen in consultation in the movement disorder clinic at the request of Hoyt Koch, MD.  The evaluation is for tremor.  He has previously seen [name] for the same.  The records that were made available to me were reviewed.  The dx given by [name] was tremor exacerbated by anxiety and enhanced physiologic tremor.  It looks like the dx of ET was entertained initially but later felt that it was likely enhanced physiologic tremor, worsened by anxiety.  The patient is a 70 y.o. right handed male with a history of tremor for 10 years.  Tremor is in both hands.  It is nowhere else.  He notices the tremor the most when typing on the computer at work.  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  There is no family hx of tremor.  He has no balance problem.    10/25/13 update:  The patient presents today for followup.  On tapering 19th, I started the patient on primidone.  The patient call me back on March 22 and we increased the dose to twice a day dosing.  He does think that it is helping, but thinks that he can still use a higher dosage.  He is not having any side effects with the medication.  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  He notes that when he is under "a high pressure situation with a doctor" he will have tremor.  His BP is under better control and he has an appt after this with his PCP to continue to work with his BP medications.  02/27/14 update:  Pt is f/u today.  Last visit, I increased his primidone to 50 mg in the AM and 100 mg at night.  He is on atenolol as well and thinks that it helps when he takes it together with the primidone.  He wonders if he can increase primidone further.  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  Continuing to work with PCP to get BP under better control but changing PCP due to retirement.  08/28/14 update:   Pt is f/u today.  On primidone 100 mg bid.  Was hoping that it came in a 100 mg tablet and asks about that today.  He states that sometimes he takes 150 mg at night if going to work and sometimes, he takes none at all if not going to work (only works 2 nights per week).  BP under better control as works with new PCP.    11/28/14 update:  Returns earlier than expected.  Doing pretty well with tremor.  Still working with BP to get under control but just took med this AM.  Watching diet better and trying to lose weight as doesn't want to be put on meds for DM.  01/30/16 update:  The patient returns today for follow-up.  I have not seen him in over a year.  When I last saw him, he was on primidone, 100 mg bid and he ended up going up on it to 250 mg q hs.  "I think I need something stronger."  He states that it affects his writing and if he is handing someone something, he will note tremor.  He notes it is worse with anxiety.  States that he is still on atenolol 100 mg daily  for BP but doesn't appear that he has actually seen PCP since 2015.    05/13/16 update:  The patient follows up today.  He is on 250 mg of primidone at night.  The patient has traditionally been very noncompliant with follow-up, especially with his primary care physician.  He did increase his propranolol after our last visit, after discussion with his primary care physician, to 120 mg daily.  He reports that he is out of that medication.  Reports that he ran out 2 weeks ago but "it didn't work."   He reports that the primidone helps more than the propranolol did.  When he saw her his blood pressure was 230/128.  She added hydrochlorothiazide and changed his losartan to olmesartan and he reports his BP under much better control.  Outside reports reviewed: historical medical records and referral letter/letters.  Allergies  Allergen Reactions  . Sildenafil Other (See Comments)    REACTION: Headache    Current Outpatient Prescriptions on  File Prior to Visit  Medication Sig Dispense Refill  . amLODipine (NORVASC) 10 MG tablet Take 1 tablet (10 mg total) by mouth daily. 90 tablet 3  . losartan-hydrochlorothiazide (HYZAAR) 100-25 MG tablet Take 1 tablet by mouth daily. 90 tablet 3  . metFORMIN (GLUCOPHAGE) 500 MG tablet TAKE 1 TABLET BY MOUTH TWICE DAILY WITH A MEAL 180 tablet 0  . Multiple Vitamins-Minerals (CENTRUM SILVER PO) Take by mouth.      . primidone (MYSOLINE) 50 MG tablet Take 5 tablets (250 mg total) by mouth at bedtime. 450 tablet 0  . simvastatin (ZOCOR) 20 MG tablet Take 1 tablet (20 mg total) by mouth at bedtime. 90 tablet 3  . propranolol ER (INDERAL LA) 120 MG 24 hr capsule TAKE 1 CAPSULE(120 MG) BY MOUTH DAILY (Patient not taking: Reported on 05/13/2016) 90 capsule 1   No current facility-administered medications on file prior to visit.     Past Medical History:  Diagnosis Date  . Allergic rhinitis   . Benign neoplasm of colon   . Bladder cancer ([place])   . Borderline diabetes   . HTN (hypertension)   . Insomnia, unspecified   . Perirectal abscess 05/10/2013  . Personal history of colonic adenomas 04/10/2010  . Psychosexual dysfunction with inhibited sexual excitement   . Unspecified hemorrhoids without mention of complication     Past Surgical History:  Procedure Laterality Date  . CATARACT EXTRACTION W/ INTRAOCULAR LENS  IMPLANT, BILATERAL    . removal cyst hip Left 05/23/2013  . TRANSTHORACIC ECHOCARDIOGRAM  04-06-2006   LVSF NORMAL/  EF 60%/ AORTIC ROOT AT UPPER LIMITS OF NORMAL SIZE  . TRANSURETHRAL RESECTION OF BLADDER TUMOR  10/08/2011   Procedure: CIRCUMCISION AND TRANSURETHRAL RESECTION OF BLADDER TUMOR (TURBT);  Surgeon: [name], MD;  Location: [facility];  Service: Urology;  Laterality: N/A;  . TRANSURETHRAL RESECTION OF BLADDER TUMOR  11/30/2011   Procedure: TRANSURETHRAL RESECTION OF BLADDER TUMOR (TURBT);  Surgeon: [name], MD;  Location:  [facility];  Service: Urology;  Laterality: N/A;    Social History   Social History  . Marital status: Married    Spouse name: N/A  . Number of children: N/A  . Years of education: N/A   Occupational History  . Part Time Ortho Tech [employer]   Social History Main Topics  . Smoking status: Former Smoker    Quit date: 05/10/1993  . Smokeless tobacco: Never Used  . Alcohol use No  .  Drug use: No  . Sexual activity: Yes   Other Topics Concern  . Not on file   Social History Narrative   [employer]   Trained as orthopedic tech   Married-divorced; remarried '78   2 sons - '68, '79    Family Status  Relation Status  . Father Deceased at age 7   Lung CA  . Sister Deceased   CA, colon  . Sister Deceased   HTN  . Sister Deceased  . Mother Deceased at age 40   Intestinal blockage  . Other   . Neg Hx     Review of Systems A complete 10 system ROS was obtained and was negative apart from what is mentioned.   Objective:   VITALS:   Vitals:   05/13/16 0853  BP: (!) 144/98  Pulse: 93  Weight: 200 lb (90.7 kg)  Height: '5\' 9"'$  (1.753 m)   Wt Readings from Last 3 Encounters:  05/13/16 200 lb (90.7 kg)  05/06/16 202 lb (91.6 kg)  01/30/16 199 lb (90.3 kg)     Gen:  Appears stated age and in NAD. HEENT:  Normocephalic, atraumatic. The mucous membranes are dry. The superficial temporal arteries are without ropiness or tenderness. Cardiovascular: Regular rate and rhythm. Lungs: Clear to auscultation bilaterally. Neck: There are no carotid bruits noted bilaterally.  NEUROLOGICAL:  Orientation:  The patient is alert and oriented x 3.  Recent and remote memory are intact.  Attention span and concentration are normal.  Able to name objects and repeat without trouble.  Fund of knowledge is appropriate Cranial nerves: There is good facial symmetry.  Soft palate rises symmetrically and there is no tongue deviation. Hearing is intact to conversational  tone. Tone: Tone is good throughout.  No rigidity. Sensation: Sensation is intact to light touch throughout Coordination:  The patient has no dysdiadichokinesia or dysmetria. Motor: Strength is 5/5 in the bilateral upper and lower extremities.  Shoulder shrug is equal bilaterally.  There is no pronator drift.  There are no fasciculations noted. Gait and Station: The patient is able to ambulate without difficulty.   MOVEMENT EXAM: Tremor:  No tremor of outstretched hands.  No tremor with weight in hand.  he has no difficulty when asked to pour a full glass of water from one glass to another.     LABS:  Lab Results  Component Value Date   TSH 1.76 11/28/2014   No results found for: VITAMINB12    Chemistry      Component Value Date/Time   NA 140 05/06/2016 0951   K 3.8 05/06/2016 0951   CL 102 05/06/2016 0951   CO2 32 05/06/2016 0951   BUN 14 05/06/2016 0951   CREATININE 1.26 05/06/2016 0951      Component Value Date/Time   CALCIUM 10.2 05/06/2016 0951   ALKPHOS 111 05/06/2016 0951   AST 14 05/06/2016 0951   ALT 18 05/06/2016 0951   BILITOT 0.4 05/06/2016 0951     Lab Results  Component Value Date   WBC 8.1 05/06/2016   HGB 16.1 05/06/2016   HCT 47.7 05/06/2016   MCV 91.0 05/06/2016   PLT 261.0 05/06/2016   Lab Results  Component Value Date   HGBA1C 7.0 (H) 05/06/2016      Assessment/Plan:   1.   ET  -pt would like to increase primidone as thinks it works better than inderal LA.  I actually saw no tremor at all today.  Continue primidone 250 q hs and  start primidone 50 mg in the AM for a month and then 100 mg in the AM.  Stop at that dosage.  -explained that primidone and inderal LA work synergistically but doesn't want to go back on inderal right now.    2.  HTN  -following with PCP now and is better than it was.  3.  F/u in 5 months.  Discussed compliance and f/u.  Much greater than 50% of this visit was spent in counseling and coordinating care.  Total  face to face time:  25 min

## 2016-05-13 NOTE — Patient Instructions (Signed)
Continue primidone 250 mg at night and start primidone 50 mg in the AM for a month and then 100 mg in the AM (and continue the 250 mg at night).  Stop at that dosage and don't go higher than that.

## 2016-05-13 NOTE — Progress Notes (Signed)
BP reviewed and no indication for change today. He is also seen with neurology today and BP there 144/98 but he is out of propranolol for 2 weeks. Will not adjust until he is on all meds. Medical treatment/procedure(s) were performed by non-physician practitioner and as supervising physician I was immediately available for consultation/collaboration. I agree with above. Hoyt Koch, MD

## 2016-06-07 ENCOUNTER — Other Ambulatory Visit: Payer: Self-pay | Admitting: Internal Medicine

## 2016-06-09 ENCOUNTER — Other Ambulatory Visit (INDEPENDENT_AMBULATORY_CARE_PROVIDER_SITE_OTHER): Payer: Medicare Other

## 2016-06-09 ENCOUNTER — Ambulatory Visit (INDEPENDENT_AMBULATORY_CARE_PROVIDER_SITE_OTHER): Payer: Medicare Other | Admitting: Internal Medicine

## 2016-06-09 ENCOUNTER — Encounter: Payer: Self-pay | Admitting: Internal Medicine

## 2016-06-09 DIAGNOSIS — I10 Essential (primary) hypertension: Secondary | ICD-10-CM

## 2016-06-09 LAB — BASIC METABOLIC PANEL
BUN: 14 mg/dL (ref 6–23)
CALCIUM: 10.2 mg/dL (ref 8.4–10.5)
CHLORIDE: 97 meq/L (ref 96–112)
CO2: 34 mEq/L — ABNORMAL HIGH (ref 19–32)
CREATININE: 1.25 mg/dL (ref 0.40–1.50)
GFR: 73.17 mL/min (ref >60.00)
Glucose, Bld: 145 mg/dL — ABNORMAL HIGH (ref 70–99)
Potassium: 3.1 mEq/L — ABNORMAL LOW (ref 3.5–5.1)
Sodium: 140 mEq/L (ref 135–145)

## 2016-06-09 NOTE — Progress Notes (Signed)
   Subjective:    Patient ID: Drew Harrington, male    DOB: [birth year], 70 y.o.   MRN: [id number]  HPI The patient is a 70 YO man coming in for follow up of his blood pressure. He states that monitoring at home and BP runs about 150/80s at home. He feels much better since starting the hctz at home. He has not noticed any side effects. Denies headaches, chest pains, SOB, abdominal problems. He is doing well overall and feels better.   Review of Systems  Constitutional: Negative.   Respiratory: Negative.   Cardiovascular: Negative.   Gastrointestinal: Negative.   Musculoskeletal: Negative.   Neurological: Negative.       Objective:   Physical Exam  Constitutional: He is oriented to person, place, and time. He appears well-developed and well-nourished.  HENT:  Head: Normocephalic and atraumatic.  Eyes: EOM are normal.  Neck: Normal range of motion.  Cardiovascular: Normal rate and regular rhythm.   Pulmonary/Chest: Effort normal. No respiratory distress. He has no wheezes. He has no rales.  Abdominal: Soft. He exhibits no distension. There is no tenderness. There is no rebound.  Musculoskeletal: He exhibits no edema.  Neurological: He is alert and oriented to person, place, and time.  Skin: Skin is warm and dry.   Vitals:   06/09/16 0835 06/09/16 0907  BP: (!) 202/118 (!) 170/80  Pulse: 100   Resp: 14   Temp: 98.6 F (37 C)   SpO2: 98%   Weight: 201 lb (91.2 kg)   Height: '5\' 9"'$  (1.753 m)      Assessment & Plan:

## 2016-06-09 NOTE — Progress Notes (Signed)
Pre visit review using our clinic review tool, if applicable. No additional management support is needed unless otherwise documented below in the visit note. 

## 2016-06-09 NOTE — Assessment & Plan Note (Signed)
Home BP readings are down some. He elects for no change to medicine today. Recheck is closer to normal and recent neurologist visit is close to goal. Taking amlodipine 10 mg daily, losartan/hctz max dose and propranolol 120 mg daily (although it is not clear if he is taking this although he states he is). Checking BMP for recent start of hctz and change dose of losartan.

## 2016-06-09 NOTE — Patient Instructions (Signed)
We are checking the labs today.   Keep watch on the blood pressure at home and call us 1-2 times per month with some readings.   We will see you back in about 6 months to check on the pressure.

## 2016-06-14 ENCOUNTER — Other Ambulatory Visit: Payer: Self-pay | Admitting: Internal Medicine

## 2016-06-14 ENCOUNTER — Other Ambulatory Visit: Payer: Self-pay | Admitting: Neurology

## 2016-07-12 ENCOUNTER — Encounter: Payer: Self-pay | Admitting: Internal Medicine

## 2016-07-22 ENCOUNTER — Encounter: Payer: Self-pay | Admitting: Internal Medicine

## 2016-09-01 ENCOUNTER — Ambulatory Visit (AMBULATORY_SURGERY_CENTER): Payer: Self-pay

## 2016-09-01 VITALS — Ht 69.0 in | Wt 204.2 lb

## 2016-09-01 DIAGNOSIS — Z860100 Personal history of colon polyps, unspecified: Secondary | ICD-10-CM

## 2016-09-01 MED ORDER — SUPREP BOWEL PREP KIT 17.5-3.13-1.6 GM/177ML PO SOLN: 1.0000 | Freq: Once | ORAL | 0 refills | Status: AC

## 2016-09-01 NOTE — Progress Notes (Signed)
No allergies to eggs or soy No home oxygen No diet meds No past problems with anesthesia  Declined emmi  

## 2016-09-02 ENCOUNTER — Encounter: Payer: Self-pay | Admitting: Internal Medicine

## 2016-09-15 ENCOUNTER — Ambulatory Visit (AMBULATORY_SURGERY_CENTER): Payer: Medicare Other | Admitting: Internal Medicine

## 2016-09-15 ENCOUNTER — Encounter: Payer: Self-pay | Admitting: Internal Medicine

## 2016-09-15 VITALS — BP 158/89 | HR 65 | Temp 98.4°F | Resp 17 | Ht 69.0 in | Wt 204.0 lb

## 2016-09-15 DIAGNOSIS — D120 Benign neoplasm of cecum: Secondary | ICD-10-CM

## 2016-09-15 DIAGNOSIS — D124 Benign neoplasm of descending colon: Secondary | ICD-10-CM

## 2016-09-15 DIAGNOSIS — D122 Benign neoplasm of ascending colon: Secondary | ICD-10-CM | POA: Diagnosis not present

## 2016-09-15 DIAGNOSIS — Z86010 Personal history of colonic polyps: Secondary | ICD-10-CM

## 2016-09-15 MED ORDER — SODIUM CHLORIDE 0.9 % IV SOLN: 500.0000 mL | INTRAVENOUS | Status: DC

## 2016-09-15 NOTE — Patient Instructions (Addendum)
5 polyps removed today so I suspect I will ask you to return in 2021. I will let you know pathology results and when to have another routine colonoscopy by mail.  I appreciate the opportunity to care for you. Gatha Mayer, MD, FACG YOU HAD AN ENDOSCOPIC PROCEDURE TODAY AT [facility]:   Refer to the procedure report that was given to you for any specific questions about what was found during the examination.  If the procedure report does not answer your questions, please call your gastroenterologist to clarify.  If you requested that your care partner not be given the details of your procedure findings, then the procedure report has been included in a sealed envelope for you to review at your convenience later.  YOU SHOULD EXPECT: Some feelings of bloating in the abdomen. Passage of more gas than usual.  Walking can help get rid of the air that was put into your GI tract during the procedure and reduce the bloating. If you had a lower endoscopy (such as a colonoscopy or flexible sigmoidoscopy) you may notice spotting of blood in your stool or on the toilet paper. If you underwent a bowel prep for your procedure, you may not have a normal bowel movement for a few days.  Please Note:  You might notice some irritation and congestion in your nose or some drainage.  This is from the oxygen used during your procedure.  There is no need for concern and it should clear up in a day or so.  SYMPTOMS TO REPORT IMMEDIATELY:   Following lower endoscopy (colonoscopy or flexible sigmoidoscopy):  Excessive amounts of blood in the stool  Significant tenderness or worsening of abdominal pains  Swelling of the abdomen that is new, acute  Fever of 100F or higher   For urgent or emergent issues, a gastroenterologist can be reached at any hour by calling [phone].   DIET:  We do recommend a small meal at first, but then you may proceed to your regular diet.  Drink plenty of  fluids but you should avoid alcoholic beverages for 24 hours.  ACTIVITY:  You should plan to take it easy for the rest of today and you should NOT DRIVE or use heavy machinery until tomorrow (because of the sedation medicines used during the test).    FOLLOW UP: Our staff will call the number listed on your records the next business day following your procedure to check on you and address any questions or concerns that you may have regarding the information given to you following your procedure. If we do not reach you, we will leave a message.  However, if you are feeling well and you are not experiencing any problems, there is no need to return our call.  We will assume that you have returned to your regular daily activities without incident.  If any biopsies were taken you will be contacted by phone or by letter within the next 1-3 weeks.  Please call us at [phone] if you have not heard about the biopsies in 3 weeks.   Await for biopsy results to determined next repeat Colonoscopy Polyps (handout given)   SIGNATURES/CONFIDENTIALITY: You and/or your care partner have signed paperwork which will be entered into your electronic medical record.  These signatures attest to the fact that that the information above on your After Visit Summary has been reviewed and is understood.  Full responsibility of the confidentiality of this discharge information lies with you  and/or your care-partner. 

## 2016-09-15 NOTE — Progress Notes (Signed)
Called to room to assist during endoscopic procedure.  Patient ID and intended procedure confirmed with present staff. Received instructions for my participation in the procedure from the performing physician.  

## 2016-09-15 NOTE — Progress Notes (Signed)
Report to PACU, RN, vss, BBS= Clear.  

## 2016-09-15 NOTE — Op Note (Signed)
[facility] Patient Name: Drew Harrington Procedure Date: 09/15/2016 10:14 AM MRN: [id number] Endoscopist: Gatha Mayer , MD Age: 71 Referring MD:  Date of Birth: 08/09/1945 Gender: Male Account #: [id number] Procedure:                Colonoscopy Indications:              Surveillance: Personal history of adenomatous                            polyps on last colonoscopy 3 years ago Medicines:                Propofol per Anesthesia, Monitored Anesthesia Care Procedure:                Pre-Anesthesia Assessment:                           - Prior to the procedure, a History and Physical                            was performed, and patient medications and                            allergies were reviewed. The patient's tolerance of                            previous anesthesia was also reviewed. The risks                            and benefits of the procedure and the sedation                            options and risks were discussed with the patient.                            All questions were answered, and informed consent                            was obtained. Prior Anticoagulants: The patient has                            taken no previous anticoagulant or antiplatelet                            agents. ASA Grade Assessment: II - A patient with                            mild systemic disease. After reviewing the risks                            and benefits, the patient was deemed in                            satisfactory condition to undergo the procedure.  After obtaining informed consent, the colonoscope                            was passed under direct vision. Throughout the                            procedure, the patient's blood pressure, pulse, and                            oxygen saturations were monitored continuously. The                            Colonoscope was introduced through the anus and   advanced to the the cecum, identified by                            appendiceal orifice and ileocecal valve. The                            colonoscopy was performed without difficulty. The                            patient tolerated the procedure well. The quality                            of the bowel preparation was good. The ileocecal                            valve, appendiceal orifice, and rectum were                            photographed. Scope In: 10:31:14 AM Scope Out: 10:49:01 AM Scope Withdrawal Time: 0 hours 14 minutes 36 seconds  Total Procedure Duration: 0 hours 17 minutes 47 seconds  Findings:                 The perianal and digital rectal examinations were                            normal. Pertinent negatives include normal prostate                            (size, shape, and consistency).                           Five sessile polyps were found in the descending                            colon, ascending colon and cecum. The polyps were 5                            to 10 mm in size. These polyps were removed with a                            cold snare. Resection  and retrieval were complete.                            Verification of patient identification for the                            specimen was done. Estimated blood loss was minimal.                           The exam was otherwise without abnormality on                            direct and retroflexion views. Complications:            No immediate complications. Estimated Blood Loss:     Estimated blood loss was minimal. Impression:               - Five 5 to 10 mm polyps in the descending colon,                            in the ascending colon and in the cecum, removed                            with a cold snare. Resected and retrieved.                           - The examination was otherwise normal on direct                            and retroflexion views.                           - Personal  history of colonic polyps. Multiple                            adenomas last seen (5) in 2015 Recommendation:           - Patient has a contact number available for                            emergencies. The signs and symptoms of potential                            delayed complications were discussed with the                            patient. Return to normal activities tomorrow.                            Written discharge instructions were provided to the                            patient.                           - Resume previous  diet.                           - Continue present medications.                           - Repeat colonoscopy is recommended for                            surveillance. The colonoscopy date will be                            determined after pathology results from today's                            exam become available for review. Gatha Mayer, MD 09/15/2016 10:59:19 AM This report has been signed electronically.

## 2016-09-16 ENCOUNTER — Telehealth: Payer: Self-pay | Admitting: *Deleted

## 2016-09-16 NOTE — Telephone Encounter (Signed)
  Follow up Call-  Call back number 09/15/2016  Post procedure Call Back phone  # [phone]  Permission to leave phone message Yes  Some recent data might be hidden     Patient questions:  Do you have a fever, pain , or abdominal swelling? No. Pain Score  0 *  Have you tolerated food without any problems? Yes.    Have you been able to return to your normal activities? Yes.    Do you have any questions about your discharge instructions: Diet   No. Medications  No. Follow up visit  No.  Do you have questions or concerns about your Care? Yes.  Still has a little gas; OTC meds advised  Actions: * If pain score is 4 or above: No action needed, pain <4.

## 2016-09-21 ENCOUNTER — Encounter: Payer: Self-pay | Admitting: Internal Medicine

## 2016-09-21 DIAGNOSIS — Z86010 Personal history of colonic polyps: Secondary | ICD-10-CM

## 2016-09-21 NOTE — Progress Notes (Signed)
5 adenomas max 10 mm recall colonoscopy 2021

## 2016-10-07 NOTE — Progress Notes (Deleted)
Subjective:    Drew Harrington was seen in consultation in the movement disorder clinic at the request of Hoyt Koch, MD.  The evaluation is for tremor.  He has previously seen [name] for the same.  The records that were made available to me were reviewed.  The dx given by [name] was tremor exacerbated by anxiety and enhanced physiologic tremor.  It looks like the dx of ET was entertained initially but later felt that it was likely enhanced physiologic tremor, worsened by anxiety.  The patient is a 71 y.o. right handed male with a history of tremor for 10 years.  Tremor is in both hands.  It is nowhere else.  He notices the tremor the most when typing on the computer at work.  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  There is no family hx of tremor.  He has no balance problem.    10/25/13 update:  The patient presents today for followup.  On tapering 19th, I started the patient on primidone.  The patient call me back on March 22 and we increased the dose to twice a day dosing.  He does think that it is helping, but thinks that he can still use a higher dosage.  He is not having any side effects with the medication.  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  He notes that when he is under "a high pressure situation with a doctor" he will have tremor.  His BP is under better control and he has an appt after this with his PCP to continue to work with his BP medications.  02/27/14 update:  Pt is f/u today.  Last visit, I increased his primidone to 50 mg in the AM and 100 mg at night.  He is on atenolol as well and thinks that it helps when he takes it together with the primidone.  He wonders if he can increase primidone further.  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  Continuing to work with PCP to get BP under better control but changing PCP due to retirement.  08/28/14 update:   Pt is f/u today.  On primidone 100 mg bid.  Was hoping that it came in a 100 mg tablet and asks about that today.  He states that sometimes he takes 150 mg at night if going to work and sometimes, he takes none at all if not going to work (only works 2 nights per week).  BP under better control as works with new PCP.    11/28/14 update:  Returns earlier than expected.  Doing pretty well with tremor.  Still working with BP to get under control but just took med this AM.  Watching diet better and trying to lose weight as doesn't want to be put on meds for DM.  01/30/16 update:  The patient returns today for follow-up.  I have not seen him in over a year.  When I last saw him, he was on primidone, 100 mg bid and he ended up going up on it to 250 mg q hs.  "I think I need something stronger."  He states that it affects his writing and if he is handing someone something, he will note tremor.  He notes it is worse with anxiety.  States that he is still on atenolol 100 mg daily  for BP but doesn't appear that he has actually seen PCP since 2015.    05/13/16 update:  The patient follows up today.  He is on 250 mg of primidone at night.  The patient has traditionally been very noncompliant with follow-up, especially with his primary care physician.  He did increase his propranolol after our last visit, after discussion with his primary care physician, to 120 mg daily.  He reports that he is out of that medication.  Reports that he ran out 2 weeks ago but "it didn't work."   He reports that the primidone helps more than the propranolol did.  When he saw her his blood pressure was 230/128.  She added hydrochlorothiazide and changed his losartan to olmesartan and he reports his BP under much better control.  10/12/16 update:  Patient's primidone has been increased, such that he is on 100 mg in the morning and 250 mg at night.  He denies any side effects with the medication.  Outside reports reviewed: historical medical  records and referral letter/letters.  Allergies  Allergen Reactions  . Sildenafil Other (See Comments)    REACTION: Headache    Current Outpatient Prescriptions on File Prior to Visit  Medication Sig Dispense Refill  . amLODipine (NORVASC) 10 MG tablet Take 1 tablet (10 mg total) by mouth daily. 90 tablet 3  . losartan-hydrochlorothiazide (HYZAAR) 100-25 MG tablet Take 1 tablet by mouth daily. 90 tablet 3  . metFORMIN (GLUCOPHAGE) 500 MG tablet TAKE 1 TABLET BY MOUTH TWICE DAILY WITH A MEAL 180 tablet 0  . Multiple Vitamins-Minerals (CENTRUM SILVER PO) Take by mouth.      . primidone (MYSOLINE) 50 MG tablet 2 tablets in the morning, 5 tablets at night (Patient taking differently: 5 tablets in the morning, 5 tablets at night) 630 tablet 1  . simvastatin (ZOCOR) 20 MG tablet Take 1 tablet (20 mg total) by mouth at bedtime. 90 tablet 3   Current Facility-Administered Medications on File Prior to Visit  Medication Dose Route Frequency Provider Last Rate Last Dose  . 0.9 %  sodium chloride infusion  500 mL Intravenous Continuous Gatha Mayer, MD        Past Medical History:  Diagnosis Date  . Allergic rhinitis   . Benign neoplasm of colon   . Bladder cancer ([place])   . Borderline diabetes   . HTN (hypertension)   . Insomnia, unspecified   . Perirectal abscess 05/10/2013  . Personal history of colonic adenomas 04/10/2010  . Psychosexual dysfunction with inhibited sexual excitement   . Unspecified hemorrhoids without mention of complication     Past Surgical History:  Procedure Laterality Date  . CATARACT EXTRACTION W/ INTRAOCULAR LENS  IMPLANT, BILATERAL    . COLONOSCOPY    . removal cyst hip Left 05/23/2013  . TRANSTHORACIC ECHOCARDIOGRAM  04-06-2006   LVSF NORMAL/  EF 60%/ AORTIC ROOT AT UPPER LIMITS OF NORMAL SIZE  . TRANSURETHRAL RESECTION OF BLADDER TUMOR  10/08/2011   Procedure: CIRCUMCISION AND TRANSURETHRAL RESECTION OF BLADDER TUMOR (TURBT);  Surgeon: [name], MD;  Location: [facility];  Service: Urology;  Laterality: N/A;  . TRANSURETHRAL RESECTION OF BLADDER TUMOR  11/30/2011   Procedure: TRANSURETHRAL RESECTION OF BLADDER TUMOR (TURBT);  Surgeon: [name], MD;  Location: [facility];  Service: Urology;  Laterality: N/A;    Social History   Social History  . Marital status: Married    Spouse name: N/A  . Number of  children: N/A  . Years of education: N/A   Occupational History  . Part Time Ortho Tech [employer]   Social History Main Topics  . Smoking status: Former Smoker    Quit date: 05/10/1993  . Smokeless tobacco: Current User    Types: Chew  . Alcohol use No  . Drug use: No  . Sexual activity: Yes   Other Topics Concern  . Not on file   Social History Narrative   HSG   Trained as orthopedic tech   Married-divorced; remarried '78   2 sons - '68, '79    Family Status  Relation Status  . Father Deceased at age 53   Lung CA  . Sister Deceased   CA, colon  . Sister Deceased   HTN  . Sister Deceased  . Mother Deceased at age 54   Intestinal blockage  . Other   . Neg Hx     Review of Systems A complete 10 system ROS was obtained and was negative apart from what is mentioned.   Objective:   VITALS:   There were no vitals filed for this visit. Wt Readings from Last 3 Encounters:  09/15/16 204 lb (92.5 kg)  09/01/16 204 lb 3.2 oz (92.6 kg)  06/09/16 201 lb (91.2 kg)     Gen:  Appears stated age and in NAD. HEENT:  Normocephalic, atraumatic. The mucous membranes are dry. The superficial temporal arteries are without ropiness or tenderness. Cardiovascular: Regular rate and rhythm. Lungs: Clear to auscultation bilaterally. Neck: There are no carotid bruits noted bilaterally.  NEUROLOGICAL:  Orientation:  The patient is alert and oriented x 3.  Recent and remote memory are intact.  Attention span and concentration are normal.  Able to name objects and  repeat without trouble.  Fund of knowledge is appropriate Cranial nerves: There is good facial symmetry.  Soft palate rises symmetrically and there is no tongue deviation. Hearing is intact to conversational tone. Tone: Tone is good throughout.  No rigidity. Sensation: Sensation is intact to light touch throughout Coordination:  The patient has no dysdiadichokinesia or dysmetria. Motor: Strength is 5/5 in the bilateral upper and lower extremities.  Shoulder shrug is equal bilaterally.  There is no pronator drift.  There are no fasciculations noted. Gait and Station: The patient is able to ambulate without difficulty.   MOVEMENT EXAM: Tremor:  No tremor of outstretched hands.  No tremor with weight in hand.  he has no difficulty when asked to pour a full glass of water from one glass to another.     LABS:  Lab Results  Component Value Date   TSH 1.76 11/28/2014   No results found for: VITAMINB12    Chemistry      Component Value Date/Time   NA 140 06/09/2016 0906   K 3.1 (L) 06/09/2016 0906   CL 97 06/09/2016 0906   CO2 34 (H) 06/09/2016 0906   BUN 14 06/09/2016 0906   CREATININE 1.25 06/09/2016 0906      Component Value Date/Time   CALCIUM 10.2 06/09/2016 0906   ALKPHOS 111 05/06/2016 0951   AST 14 05/06/2016 0951   ALT 18 05/06/2016 0951   BILITOT 0.4 05/06/2016 0951     Lab Results  Component Value Date   WBC 8.1 05/06/2016   HGB 16.1 05/06/2016   HCT 47.7 05/06/2016   MCV 91.0 05/06/2016   PLT 261.0 05/06/2016   Lab Results  Component Value Date   HGBA1C 7.0 (H) 05/06/2016  Assessment/Plan:   1.   ET  -pt would like to increase primidone as thinks it works better than inderal LA.  I actually saw no tremor at all today.  Continue primidone 250 q hs and start primidone 50 mg in the AM for a month and then 100 mg in the AM.  Stop at that dosage.  -explained that primidone and inderal LA work synergistically but doesn't want to go back on inderal right now.     2.  HTN  -following with PCP now and is better than it was.  3.  F/u in 5 months.  Discussed compliance and f/u.  Much greater than 50% of this visit was spent in counseling and coordinating care.  Total face to face time:  25 min

## 2016-10-12 ENCOUNTER — Ambulatory Visit: Payer: Medicare Other | Admitting: Neurology

## 2016-10-13 ENCOUNTER — Ambulatory Visit: Payer: Medicare Other | Admitting: Neurology

## 2016-10-28 NOTE — Progress Notes (Deleted)
Subjective:    Drew Harrington was seen in consultation in the movement disorder clinic at the request of Hoyt Koch, MD.  The evaluation is for tremor.  He has previously seen [name] for the same.  The records that were made available to me were reviewed.  The dx given by [name] was tremor exacerbated by anxiety and enhanced physiologic tremor.  It looks like the dx of ET was entertained initially but later felt that it was likely enhanced physiologic tremor, worsened by anxiety.  The patient is a 71 y.o. right handed male with a history of tremor for 10 years.  Tremor is in both hands.  It is nowhere else.  He notices the tremor the most when typing on the computer at work.  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  There is no family hx of tremor.  He has no balance problem.    10/25/13 update:  The patient presents today for followup.  On tapering 19th, I started the patient on primidone.  The patient call me back on March 22 and we increased the dose to twice a day dosing.  He does think that it is helping, but thinks that he can still use a higher dosage.  He is not having any side effects with the medication.  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  He notes that when he is under "a high pressure situation with a doctor" he will have tremor.  His BP is under better control and he has an appt after this with his PCP to continue to work with his BP medications.  02/27/14 update:  Pt is f/u today.  Last visit, I increased his primidone to 50 mg in the AM and 100 mg at night.  He is on atenolol as well and thinks that it helps when he takes it together with the primidone.  He wonders if he can increase primidone further.  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  Continuing to work with PCP to get BP under better control but changing PCP due to retirement.  08/28/14 update:   Pt is f/u today.  On primidone 100 mg bid.  Was hoping that it came in a 100 mg tablet and asks about that today.  He states that sometimes he takes 150 mg at night if going to work and sometimes, he takes none at all if not going to work (only works 2 nights per week).  BP under better control as works with new PCP.    11/28/14 update:  Returns earlier than expected.  Doing pretty well with tremor.  Still working with BP to get under control but just took med this AM.  Watching diet better and trying to lose weight as doesn't want to be put on meds for DM.  01/30/16 update:  The patient returns today for follow-up.  I have not seen him in over a year.  When I last saw him, he was on primidone, 100 mg bid and he ended up going up on it to 250 mg q hs.  "I think I need something stronger."  He states that it affects his writing and if he is handing someone something, he will note tremor.  He notes it is worse with anxiety.  States that he is still on atenolol 100 mg daily  for BP but doesn't appear that he has actually seen PCP since 2015.    05/13/16 update:  The patient follows up today.  He is on 250 mg of primidone at night.  The patient has traditionally been very noncompliant with follow-up, especially with his primary care physician.  He did increase his propranolol after our last visit, after discussion with his primary care physician, to 120 mg daily.  He reports that he is out of that medication.  Reports that he ran out 2 weeks ago but "it didn't work."   He reports that the primidone helps more than the propranolol did.  When he saw her his blood pressure was 230/128.  She added hydrochlorothiazide and changed his losartan to olmesartan and he reports his BP under much better control.  11/01/16 update:  Patient's primidone has been increased, such that he is on 100 mg in the morning and 250 mg at night.  He denies any side effects with the medication.  Outside reports reviewed: historical medical  records and referral letter/letters.  Allergies  Allergen Reactions  . Sildenafil Other (See Comments)    REACTION: Headache    Current Outpatient Prescriptions on File Prior to Visit  Medication Sig Dispense Refill  . amLODipine (NORVASC) 10 MG tablet Take 1 tablet (10 mg total) by mouth daily. 90 tablet 3  . losartan-hydrochlorothiazide (HYZAAR) 100-25 MG tablet Take 1 tablet by mouth daily. 90 tablet 3  . metFORMIN (GLUCOPHAGE) 500 MG tablet TAKE 1 TABLET BY MOUTH TWICE DAILY WITH A MEAL 180 tablet 0  . Multiple Vitamins-Minerals (CENTRUM SILVER PO) Take by mouth.      . primidone (MYSOLINE) 50 MG tablet 2 tablets in the morning, 5 tablets at night (Patient taking differently: 5 tablets in the morning, 5 tablets at night) 630 tablet 1  . simvastatin (ZOCOR) 20 MG tablet Take 1 tablet (20 mg total) by mouth at bedtime. 90 tablet 3   Current Facility-Administered Medications on File Prior to Visit  Medication Dose Route Frequency Provider Last Rate Last Dose  . 0.9 %  sodium chloride infusion  500 mL Intravenous Continuous Drew Mayer, MD        Past Medical History:  Diagnosis Date  . Allergic rhinitis   . Benign neoplasm of colon   . Bladder cancer ([place])   . Borderline diabetes   . HTN (hypertension)   . Insomnia, unspecified   . Perirectal abscess 05/10/2013  . Personal history of colonic adenomas 04/10/2010  . Psychosexual dysfunction with inhibited sexual excitement   . Unspecified hemorrhoids without mention of complication     Past Surgical History:  Procedure Laterality Date  . CATARACT EXTRACTION W/ INTRAOCULAR LENS  IMPLANT, BILATERAL    . COLONOSCOPY    . removal cyst hip Left 05/23/2013  . TRANSTHORACIC ECHOCARDIOGRAM  04-06-2006   LVSF NORMAL/  EF 60%/ AORTIC ROOT AT UPPER LIMITS OF NORMAL SIZE  . TRANSURETHRAL RESECTION OF BLADDER TUMOR  10/08/2011   Procedure: CIRCUMCISION AND TRANSURETHRAL RESECTION OF BLADDER TUMOR (TURBT);  Surgeon: [name], MD;  Location: [facility];  Service: Urology;  Laterality: N/A;  . TRANSURETHRAL RESECTION OF BLADDER TUMOR  11/30/2011   Procedure: TRANSURETHRAL RESECTION OF BLADDER TUMOR (TURBT);  Surgeon: [name], MD;  Location: [facility];  Service: Urology;  Laterality: N/A;    Social History   Social History  . Marital status: Married    Spouse name: N/A  . Number of  children: N/A  . Years of education: N/A   Occupational History  . Part Time Ortho Tech [place]   Social History Main Topics  . Smoking status: Former Smoker    Quit date: 05/10/1993  . Smokeless tobacco: Current User    Types: Chew  . Alcohol use No  . Drug use: No  . Sexual activity: Yes   Other Topics Concern  . Not on file   Social History Narrative   HSG   Trained as orthopedic tech   Married-divorced; remarried '78   2 sons - '68, '79    Family Status  Relation Status  . Father Deceased at age 82   Lung CA  . Sister Deceased   CA, colon  . Sister Deceased   HTN  . Sister Deceased  . Mother Deceased at age 43   Intestinal blockage  . Other   . Neg Hx     Review of Systems A complete 10 system ROS was obtained and was negative apart from what is mentioned.   Objective:   VITALS:   There were no vitals filed for this visit. Wt Readings from Last 3 Encounters:  09/15/16 204 lb (92.5 kg)  09/01/16 204 lb 3.2 oz (92.6 kg)  06/09/16 201 lb (91.2 kg)     Gen:  Appears stated age and in NAD. HEENT:  Normocephalic, atraumatic. The mucous membranes are dry. The superficial temporal arteries are without ropiness or tenderness. Cardiovascular: Regular rate and rhythm. Lungs: Clear to auscultation bilaterally. Neck: There are no carotid bruits noted bilaterally.  NEUROLOGICAL:  Orientation:  The patient is alert and oriented x 3.  Recent and remote memory are intact.  Attention span and concentration are normal.  Able to name objects and  repeat without trouble.  Fund of knowledge is appropriate Cranial nerves: There is good facial symmetry.  Soft palate rises symmetrically and there is no tongue deviation. Hearing is intact to conversational tone. Tone: Tone is good throughout.  No rigidity. Sensation: Sensation is intact to light touch throughout Coordination:  The patient has no dysdiadichokinesia or dysmetria. Motor: Strength is 5/5 in the bilateral upper and lower extremities.  Shoulder shrug is equal bilaterally.  There is no pronator drift.  There are no fasciculations noted. Gait and Station: The patient is able to ambulate without difficulty.   MOVEMENT EXAM: Tremor:  No tremor of outstretched hands.  No tremor with weight in hand.  he has no difficulty when asked to pour a full glass of water from one glass to another.     LABS:  Lab Results  Component Value Date   TSH 1.76 11/28/2014   No results found for: VITAMINB12    Chemistry      Component Value Date/Time   NA 140 06/09/2016 0906   K 3.1 (L) 06/09/2016 0906   CL 97 06/09/2016 0906   CO2 34 (H) 06/09/2016 0906   BUN 14 06/09/2016 0906   CREATININE 1.25 06/09/2016 0906      Component Value Date/Time   CALCIUM 10.2 06/09/2016 0906   ALKPHOS 111 05/06/2016 0951   AST 14 05/06/2016 0951   ALT 18 05/06/2016 0951   BILITOT 0.4 05/06/2016 0951     Lab Results  Component Value Date   WBC 8.1 05/06/2016   HGB 16.1 05/06/2016   HCT 47.7 05/06/2016   MCV 91.0 05/06/2016   PLT 261.0 05/06/2016   Lab Results  Component Value Date   HGBA1C 7.0 (H) 05/06/2016  Assessment/Plan:   1.   ET  -pt would like to increase primidone as thinks it works better than inderal LA.  I actually saw no tremor at all today.  Continue primidone 250 q hs and start primidone 50 mg in the AM for a month and then 100 mg in the AM.  Stop at that dosage.  -explained that primidone and inderal LA work synergistically but doesn't want to go back on inderal right now.     2.  HTN  -following with PCP now and is better than it was.  3.  F/u in 5 months.  Discussed compliance and f/u.  Much greater than 50% of this visit was spent in counseling and coordinating care.  Total face to face time:  25 min

## 2016-11-01 ENCOUNTER — Telehealth: Payer: Self-pay | Admitting: Neurology

## 2016-11-01 ENCOUNTER — Ambulatory Visit: Payer: Medicare Other | Admitting: Neurology

## 2016-11-01 DIAGNOSIS — G250 Essential tremor: Secondary | ICD-10-CM

## 2016-11-01 NOTE — Telephone Encounter (Signed)
Pt showed up late and was asked to wait and told we would try to work him in between patients .  I was ready to see patient by 9:50am but he had decided to leave the office and requested transfer out of the office to another neurologist.  Will let PCP decide who she would like to refer the patient to.  He was apparently very unpleasant to the staff, although they were trying to be accomodating to him today.  Benjamine Mola, would you mind providing a new referral for him please?

## 2016-11-01 NOTE — Telephone Encounter (Signed)
We also refunded the copay for today appt

## 2016-11-01 NOTE — Telephone Encounter (Signed)
Please advise who you would prefer we send patient to?

## 2016-11-01 NOTE — Telephone Encounter (Signed)
Patient came into the office for appt today and was late for appt. He was told that we would need to resch appt or he could wait and see if Dr Tat would be able to see patient when she finished with her New patient she had just went in with. He wanted to first be switched to a new Dr in our office and then asked to have a referral done for different office all together.  Please do a referral to a different Neurology office and let patient know that it was sent  Thank you

## 2016-11-03 NOTE — Telephone Encounter (Signed)
Patient made aware message was sent to PCP about referring him out. He will contact them to follow up.

## 2016-11-03 NOTE — Telephone Encounter (Signed)
Caller: PT  Urgent? No  Reason for the call: PT called this morning and wanted to know the status of his referral to [facility]

## 2016-11-04 DIAGNOSIS — C672 Malignant neoplasm of lateral wall of bladder: Secondary | ICD-10-CM | POA: Diagnosis not present

## 2016-11-04 NOTE — Addendum Note (Signed)
Addended by: Pricilla Holm A on: 11/04/2016 04:03 PM   Modules accepted: Orders

## 2016-11-11 DIAGNOSIS — C672 Malignant neoplasm of lateral wall of bladder: Secondary | ICD-10-CM | POA: Diagnosis not present

## 2016-11-11 DIAGNOSIS — C679 Malignant neoplasm of bladder, unspecified: Secondary | ICD-10-CM | POA: Diagnosis not present

## 2016-11-25 DIAGNOSIS — G4733 Obstructive sleep apnea (adult) (pediatric): Secondary | ICD-10-CM | POA: Diagnosis not present

## 2016-12-03 NOTE — Telephone Encounter (Signed)
Drew Harrington, Please enter neurology referral to [facility].   Thanks

## 2016-12-03 NOTE — Addendum Note (Signed)
Addended by: Wilfred Lacy L on: 12/03/2016 05:02 PM   Modules accepted: Orders

## 2016-12-22 DIAGNOSIS — G4733 Obstructive sleep apnea (adult) (pediatric): Secondary | ICD-10-CM | POA: Diagnosis not present

## 2017-01-13 ENCOUNTER — Encounter: Payer: Self-pay | Admitting: Nurse Practitioner

## 2017-01-13 ENCOUNTER — Other Ambulatory Visit (INDEPENDENT_AMBULATORY_CARE_PROVIDER_SITE_OTHER): Payer: Medicare Other

## 2017-01-13 ENCOUNTER — Ambulatory Visit (INDEPENDENT_AMBULATORY_CARE_PROVIDER_SITE_OTHER): Payer: Medicare Other | Admitting: Nurse Practitioner

## 2017-01-13 VITALS — BP 160/90 | HR 89 | Temp 98.1°F | Ht 69.0 in | Wt 202.0 lb

## 2017-01-13 DIAGNOSIS — E1169 Type 2 diabetes mellitus with other specified complication: Secondary | ICD-10-CM

## 2017-01-13 DIAGNOSIS — E119 Type 2 diabetes mellitus without complications: Secondary | ICD-10-CM | POA: Diagnosis not present

## 2017-01-13 DIAGNOSIS — E785 Hyperlipidemia, unspecified: Secondary | ICD-10-CM | POA: Diagnosis not present

## 2017-01-13 DIAGNOSIS — I10 Essential (primary) hypertension: Secondary | ICD-10-CM | POA: Diagnosis not present

## 2017-01-13 LAB — HEPATIC FUNCTION PANEL
ALBUMIN: 4 g/dL (ref 3.5–5.2)
ALK PHOS: 139 U/L — AB (ref 39–117)
ALT: 15 U/L (ref 0–53)
AST: 14 U/L (ref 0–37)
BILIRUBIN DIRECT: 0.1 mg/dL (ref 0.0–0.3)
TOTAL PROTEIN: 7.2 g/dL (ref 6.0–8.3)
Total Bilirubin: 0.3 mg/dL (ref 0.2–1.2)

## 2017-01-13 LAB — BASIC METABOLIC PANEL
BUN: 14 mg/dL (ref 6–23)
CHLORIDE: 98 meq/L (ref 96–112)
CO2: 33 meq/L — AB (ref 19–32)
Calcium: 9.7 mg/dL (ref 8.4–10.5)
Creatinine, Ser: 1.24 mg/dL (ref 0.40–1.50)
GFR: 73.73 mL/min (ref >60.00)
GLUCOSE: 156 mg/dL — AB (ref 70–99)
POTASSIUM: 3 meq/L — AB (ref 3.5–5.1)
SODIUM: 139 meq/L (ref 135–145)

## 2017-01-13 LAB — LIPID PANEL
CHOLESTEROL: 166 mg/dL (ref 0–200)
HDL: 47.8 mg/dL (ref >39.00)
LDL CALC: 90 mg/dL (ref 0–99)
NonHDL: 118.49
TRIGLYCERIDES: 140 mg/dL (ref 0.0–149.0)
Total CHOL/HDL Ratio: 3
VLDL: 28 mg/dL (ref 0.0–40.0)

## 2017-01-13 LAB — HEMOGLOBIN A1C: Hgb A1c MFr Bld: 7 % — ABNORMAL HIGH (ref 4.6–6.5)

## 2017-01-13 MED ORDER — SIMVASTATIN 20 MG PO TABS: 20.0000 mg | ORAL_TABLET | Freq: Every day | ORAL | 3 refills | Status: DC

## 2017-01-13 MED ORDER — LOSARTAN POTASSIUM-HCTZ 100-25 MG PO TABS: 1.0000 | ORAL_TABLET | Freq: Every day | ORAL | 3 refills | Status: DC

## 2017-01-13 MED ORDER — METFORMIN HCL 500 MG PO TABS: ORAL_TABLET | ORAL | 3 refills | Status: DC

## 2017-01-13 MED ORDER — PROPRANOLOL HCL ER 120 MG PO CP24: 120.0000 mg | ORAL_CAPSULE | Freq: Every day | ORAL | 3 refills | Status: DC

## 2017-01-13 MED ORDER — POTASSIUM CHLORIDE CRYS ER 20 MEQ PO TBCR: 20.0000 meq | EXTENDED_RELEASE_TABLET | Freq: Every day | ORAL | 3 refills | Status: DC

## 2017-01-13 MED ORDER — AMLODIPINE BESYLATE 10 MG PO TABS: 10.0000 mg | ORAL_TABLET | Freq: Every day | ORAL | 3 refills | Status: DC

## 2017-01-13 NOTE — Assessment & Plan Note (Signed)
Uncontrolled with losartan-HCTZ, and amlodipine. Unsure when he stopped taking propanolol. Resume propanolol

## 2017-01-13 NOTE — Assessment & Plan Note (Signed)
Stable with hgbA1c 7.0 Continue metformin 500mg  BID
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
## 2017-01-13 NOTE — Assessment & Plan Note (Signed)
LDL at goal. continue simvastatin

## 2017-01-13 NOTE — Patient Instructions (Signed)
Check BP at home 2-3 times a week. If BP is persistently >150/90, call office.  Go to basement for blood draw. You will be called with results  DASH Eating Plan DASH stands for "Dietary Approaches to Stop Hypertension." The DASH eating plan is a healthy eating plan that has been shown to reduce high blood pressure (hypertension). It may also reduce your risk for type 2 diabetes, heart disease, and stroke. The DASH eating plan may also help with weight loss. What are tips for following this plan? General guidelines  Avoid eating more than 2,300 mg (milligrams) of salt (sodium) a day. If you have hypertension, you may need to reduce your sodium intake to 1,500 mg a day.  Limit alcohol intake to no more than 1 drink a day for nonpregnant women and 2 drinks a day for men. One drink equals 12 oz of beer, 5 oz of wine, or 1 oz of hard liquor.  Work with your health care provider to maintain a healthy body weight or to lose weight. Ask what an ideal weight is for you.  Get at least 30 minutes of exercise that causes your heart to beat faster (aerobic exercise) most days of the week. Activities may include walking, swimming, or biking.  Work with your health care provider or diet and nutrition specialist (dietitian) to adjust your eating plan to your individual calorie needs. Reading food labels  Check food labels for the amount of sodium per serving. Choose foods with less than 5 percent of the Daily Value of sodium. Generally, foods with less than 300 mg of sodium per serving fit into this eating plan.  To find whole grains, look for the word "whole" as the first word in the ingredient list. Shopping  Buy products labeled as "low-sodium" or "no salt added."  Buy fresh foods. Avoid canned foods and premade or frozen meals. Cooking  Avoid adding salt when cooking. Use salt-free seasonings or herbs instead of table salt or sea salt. Check with your health care provider or pharmacist before  using salt substitutes.  Do not fry foods. Cook foods using healthy methods such as baking, boiling, grilling, and broiling instead.  Cook with heart-healthy oils, such as olive, canola, soybean, or sunflower oil. Meal planning   Eat a balanced diet that includes: ? 5 or more servings of fruits and vegetables each day. At each meal, try to fill half of your plate with fruits and vegetables. ? Up to 6-8 servings of whole grains each day. ? Less than 6 oz of lean meat, poultry, or fish each day. A 3-oz serving of meat is about the same size as a deck of cards. One egg equals 1 oz. ? 2 servings of low-fat dairy each day. ? A serving of nuts, seeds, or beans 5 times each week. ? Heart-healthy fats. Healthy fats called Omega-3 fatty acids are found in foods such as flaxseeds and coldwater fish, like sardines, salmon, and mackerel.  Limit how much you eat of the following: ? Canned or prepackaged foods. ? Food that is high in trans fat, such as fried foods. ? Food that is high in saturated fat, such as fatty meat. ? Sweets, desserts, sugary drinks, and other foods with added sugar. ? Full-fat dairy products.  Do not salt foods before eating.  Try to eat at least 2 vegetarian meals each week.  Eat more home-cooked food and less restaurant, buffet, and fast food.  When eating at a restaurant, ask that your food  be prepared with less salt or no salt, if possible. What foods are recommended? The items listed may not be a complete list. Talk with your dietitian about what dietary choices are best for you. Grains Whole-grain or whole-wheat bread. Whole-grain or whole-wheat pasta. Brown rice. [place]ena Morrow. Bulgur. Whole-grain and low-sodium cereals. Pita bread. Low-fat, low-sodium crackers. Whole-wheat flour tortillas. Vegetables Fresh or frozen vegetables (raw, steamed, roasted, or grilled). Low-sodium or reduced-sodium tomato and vegetable juice. Low-sodium or reduced-sodium tomato sauce  and tomato paste. Low-sodium or reduced-sodium canned vegetables. Fruits All fresh, dried, or frozen fruit. Canned fruit in natural juice (without added sugar). Meat and other protein foods Skinless chicken or [place]. Ground chicken or [place]. Pork with fat trimmed off. Fish and seafood. Egg whites. Dried beans, peas, or lentils. Unsalted nuts, nut butters, and seeds. Unsalted canned beans. Lean cuts of beef with fat trimmed off. Low-sodium, lean deli meat. Dairy Low-fat (1%) or fat-free (skim) milk. Fat-free, low-fat, or reduced-fat cheeses. Nonfat, low-sodium ricotta or cottage cheese. Low-fat or nonfat yogurt. Low-fat, low-sodium cheese. Fats and oils Soft margarine without trans fats. Vegetable oil. Low-fat, reduced-fat, or light mayonnaise and salad dressings (reduced-sodium). Canola, safflower, olive, soybean, and sunflower oils. Avocado. Seasoning and other foods Herbs. Spices. Seasoning mixes without salt. Unsalted popcorn and pretzels. Fat-free sweets. What foods are not recommended? The items listed may not be a complete list. Talk with your dietitian about what dietary choices are best for you. Grains Baked goods made with fat, such as croissants, muffins, or some breads. Dry pasta or rice meal packs. Vegetables Creamed or fried vegetables. Vegetables in a cheese sauce. Regular canned vegetables (not low-sodium or reduced-sodium). Regular canned tomato sauce and paste (not low-sodium or reduced-sodium). Regular tomato and vegetable juice (not low-sodium or reduced-sodium). Angie Fava. Olives. Fruits Canned fruit in a light or heavy syrup. Fried fruit. Fruit in cream or butter sauce. Meat and other protein foods Fatty cuts of meat. Ribs. Fried meat. Berniece Salines. Sausage. Bologna and other processed lunch meats. Salami. Fatback. Hotdogs. Bratwurst. Salted nuts and seeds. Canned beans with added salt. Canned or smoked fish. Whole eggs or egg yolks. Chicken or [place] with skin. Dairy Whole or 2%  milk, cream, and half-and-half. Whole or full-fat cream cheese. Whole-fat or sweetened yogurt. Full-fat cheese. Nondairy creamers. Whipped toppings. Processed cheese and cheese spreads. Fats and oils Butter. Stick margarine. Lard. Shortening. Ghee. Bacon fat. Tropical oils, such as coconut, palm kernel, or palm oil. Seasoning and other foods Salted popcorn and pretzels. Onion salt, garlic salt, seasoned salt, table salt, and sea salt. Worcestershire sauce. Tartar sauce. Barbecue sauce. Teriyaki sauce. Soy sauce, including reduced-sodium. Steak sauce. Canned and packaged gravies. Fish sauce. Oyster sauce. Cocktail sauce. Horseradish that you find on the shelf. Ketchup. Mustard. Meat flavorings and tenderizers. Bouillon cubes. Hot sauce and Tabasco sauce. Premade or packaged marinades. Premade or packaged taco seasonings. Relishes. Regular salad dressings. Where to find more information:  National Heart, Lung, and [place]: https://wilson-eaton.com/  American Heart Association: www.heart.org Summary  The DASH eating plan is a healthy eating plan that has been shown to reduce high blood pressure (hypertension). It may also reduce your risk for type 2 diabetes, heart disease, and stroke.  With the DASH eating plan, you should limit salt (sodium) intake to 2,300 mg a day. If you have hypertension, you may need to reduce your sodium intake to 1,500 mg a day.  When on the DASH eating plan, aim to eat more fresh fruits and vegetables, whole  grains, lean proteins, low-fat dairy, and heart-healthy fats.  Work with your health care provider or diet and nutrition specialist (dietitian) to adjust your eating plan to your individual calorie needs. This information is not intended to replace advice given to you by your health care provider. Make sure you discuss any questions you have with your health care provider. Document Released: 06/03/2011 Document Revised: 06/07/2016 Document Reviewed:  06/07/2016 Elsevier Interactive Patient Education  2017 [employer].

## 2017-01-18 ENCOUNTER — Other Ambulatory Visit: Payer: Self-pay | Admitting: Internal Medicine

## 2017-01-18 ENCOUNTER — Other Ambulatory Visit: Payer: Self-pay | Admitting: Neurology

## 2017-01-18 NOTE — Telephone Encounter (Signed)
Appears was RF on 7/19 by his NP

## 2017-01-18 NOTE — Telephone Encounter (Signed)
Does not see [facility] until 01/20/2017. Please advise on refill.

## 2017-01-20 ENCOUNTER — Other Ambulatory Visit: Payer: Self-pay | Admitting: Neurology

## 2017-01-20 ENCOUNTER — Telehealth: Payer: Self-pay | Admitting: Internal Medicine

## 2017-01-20 ENCOUNTER — Institutional Professional Consult (permissible substitution): Payer: Self-pay | Admitting: Neurology

## 2017-01-20 NOTE — Telephone Encounter (Signed)
FYI: Received a referral msg from [facility] regarding pt's referral  "This patient was scheduled to come in today to see Dr Rexene Alberts but Dr Rexene Alberts said to cancel appt due to She saw the patient in 2015 then the patient went to [facility] and is now wanting to come back here. Dr Rexene Alberts said we do not have patients switching from one practice to another so we will not R/S appt and will not be seeing the patient again dg"  Pt was seeing Dr. Carles Collet - refer to phone note 11/01/16  This pt will probably calling the office looking for a new referral.  thanks

## 2017-01-20 NOTE — Telephone Encounter (Signed)
Pt left a msg on my vm regarding the previous note. He stated [facility] told him to contact us. Can you please call pt at [phone]

## 2017-01-31 ENCOUNTER — Ambulatory Visit: Payer: Medicare Other | Admitting: Nurse Practitioner

## 2017-02-01 ENCOUNTER — Encounter: Payer: Self-pay | Admitting: Nurse Practitioner

## 2017-02-01 ENCOUNTER — Ambulatory Visit (INDEPENDENT_AMBULATORY_CARE_PROVIDER_SITE_OTHER): Payer: Medicare Other | Admitting: Nurse Practitioner

## 2017-02-01 VITALS — BP 146/86 | HR 71 | Temp 98.1°F | Ht 69.0 in | Wt 199.0 lb

## 2017-02-01 DIAGNOSIS — G250 Essential tremor: Secondary | ICD-10-CM

## 2017-02-01 DIAGNOSIS — I10 Essential (primary) hypertension: Secondary | ICD-10-CM

## 2017-02-01 MED ORDER — PRIMIDONE 50 MG PO TABS: ORAL_TABLET | ORAL | 0 refills | Status: DC

## 2017-02-01 NOTE — Patient Instructions (Signed)
Please give additional primidone refill from neurology.
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
## 2017-02-23 DIAGNOSIS — G4733 Obstructive sleep apnea (adult) (pediatric): Secondary | ICD-10-CM | POA: Diagnosis not present

## 2017-04-08 DIAGNOSIS — E119 Type 2 diabetes mellitus without complications: Secondary | ICD-10-CM | POA: Diagnosis not present

## 2017-04-08 LAB — HM DIABETES EYE EXAM

## 2017-04-14 ENCOUNTER — Other Ambulatory Visit: Payer: Self-pay | Admitting: Nurse Practitioner

## 2017-04-14 DIAGNOSIS — G250 Essential tremor: Secondary | ICD-10-CM

## 2017-04-17 ENCOUNTER — Other Ambulatory Visit: Payer: Self-pay | Admitting: Neurology

## 2017-04-20 ENCOUNTER — Encounter: Payer: Self-pay | Admitting: Internal Medicine

## 2017-04-25 ENCOUNTER — Encounter: Payer: Self-pay | Admitting: Internal Medicine

## 2017-04-25 ENCOUNTER — Ambulatory Visit (INDEPENDENT_AMBULATORY_CARE_PROVIDER_SITE_OTHER): Payer: Medicare Other | Admitting: Internal Medicine

## 2017-04-25 DIAGNOSIS — G250 Essential tremor: Secondary | ICD-10-CM

## 2017-04-25 DIAGNOSIS — I10 Essential (primary) hypertension: Secondary | ICD-10-CM | POA: Diagnosis not present

## 2017-04-25 MED ORDER — PRIMIDONE 50 MG PO TABS: ORAL_TABLET | ORAL | 0 refills | Status: DC

## 2017-04-25 NOTE — Assessment & Plan Note (Signed)
He is happy with his control right now and admits to lower numbers at home on his current amlodipine, losartan/hctz and inderal. He is not controlled here but he declines to make changes today.

## 2017-04-25 NOTE — Patient Instructions (Signed)
We have refilled the primidone for you today and will get you in with a neurologist.

## 2017-04-25 NOTE — Progress Notes (Signed)
   Subjective:    Patient ID: Drew Harrington, male    DOB: [birth year], 71 y.o.   MRN: [id number]  HPI The patient is a 71 YO man coming in for follow up of his tremor. He had seen neurology in the past.He does feel like Dr. Carles Collet had stopped listening to him and so he did not return to see her. He is still taking the primidone 100 mg qam and 250 mg qpm. He is also taking inderal daily for the tremor. 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   Review of Systems  Constitutional: Negative.   HENT: Negative.   Eyes: Negative.   Respiratory: Negative for cough, chest tightness and shortness of breath.   Cardiovascular: Negative for chest pain, palpitations and leg swelling.  Gastrointestinal: Negative for abdominal distention, abdominal pain, constipation, diarrhea, nausea and vomiting.  Musculoskeletal: Negative.   Neurological: Positive for tremors. Negative for dizziness, seizures, light-headedness and numbness.     Objective:   Physical Exam  Constitutional: He is oriented to person, place, and time. He appears well-developed and well-nourished.  HENT:  Head: Normocephalic and atraumatic.  Eyes: EOM are normal.  Neck: Normal range of motion.  Cardiovascular: Normal rate and regular rhythm.   Pulmonary/Chest: Effort normal and breath sounds normal. No respiratory distress. He has no wheezes. He has no rales.  Abdominal: Soft. Bowel sounds are normal. He exhibits no distension. There is no tenderness. There is no rebound.  Neurological: He is alert and oriented to person, place, and time. Coordination normal.  No noticeable tremor on exam today  Skin: Skin is warm and dry.   Vitals:   04/25/17 0825 04/25/17 0844  BP: (!) 150/100 (!) 150/98  Pulse: 74   Temp: 98.3 F (36.8 C)   TempSrc: Oral   SpO2: 98%   Weight: 202 lb  (91.6 kg)   Height: 5\' 9"  (1.753 m)       Assessment & Plan:

## 2017-04-25 NOTE — Assessment & Plan Note (Addendum)
He is not seeing neurology recently and placed referral. He would like to return to the same office but another provider. Refill primidone and inderal today.

## 2017-05-11 DIAGNOSIS — Z8551 Personal history of malignant neoplasm of bladder: Secondary | ICD-10-CM | POA: Diagnosis not present

## 2017-05-24 DIAGNOSIS — G4733 Obstructive sleep apnea (adult) (pediatric): Secondary | ICD-10-CM | POA: Diagnosis not present

## 2017-06-21 DIAGNOSIS — G4733 Obstructive sleep apnea (adult) (pediatric): Secondary | ICD-10-CM | POA: Diagnosis not present

## 2017-08-19 ENCOUNTER — Other Ambulatory Visit: Payer: Self-pay | Admitting: Internal Medicine

## 2017-08-19 DIAGNOSIS — G250 Essential tremor: Secondary | ICD-10-CM

## 2017-08-22 DIAGNOSIS — G4733 Obstructive sleep apnea (adult) (pediatric): Secondary | ICD-10-CM | POA: Diagnosis not present

## 2017-08-31 ENCOUNTER — Telehealth: Payer: Self-pay | Admitting: Internal Medicine

## 2017-08-31 NOTE — Telephone Encounter (Signed)
Pt called in regarding his neurology referral that was put in last October.  [facility] is unable to see him.  Can you please put another referral for neurology to be seen at [facility] in [facility]  Thank you

## 2017-09-01 NOTE — Telephone Encounter (Signed)
Left msg for pt to call back to schedule appointment

## 2017-09-01 NOTE — Telephone Encounter (Signed)
Pt has been scheduled.  °

## 2017-09-01 NOTE — Telephone Encounter (Signed)
Cannot do. He is overdue for follow up of his poorly controlled hypertension and would need to have an office visit to get this referral.

## 2017-09-05 ENCOUNTER — Encounter: Payer: Self-pay | Admitting: Internal Medicine

## 2017-09-05 ENCOUNTER — Ambulatory Visit (INDEPENDENT_AMBULATORY_CARE_PROVIDER_SITE_OTHER): Payer: Medicare Other | Admitting: Internal Medicine

## 2017-09-05 ENCOUNTER — Other Ambulatory Visit (INDEPENDENT_AMBULATORY_CARE_PROVIDER_SITE_OTHER): Payer: Medicare Other

## 2017-09-05 VITALS — BP 170/100 | HR 85 | Temp 98.8°F | Ht 69.0 in | Wt 205.0 lb

## 2017-09-05 DIAGNOSIS — E119 Type 2 diabetes mellitus without complications: Secondary | ICD-10-CM

## 2017-09-05 DIAGNOSIS — E785 Hyperlipidemia, unspecified: Secondary | ICD-10-CM | POA: Diagnosis not present

## 2017-09-05 DIAGNOSIS — E1169 Type 2 diabetes mellitus with other specified complication: Secondary | ICD-10-CM | POA: Diagnosis not present

## 2017-09-05 DIAGNOSIS — G250 Essential tremor: Secondary | ICD-10-CM

## 2017-09-05 DIAGNOSIS — I10 Essential (primary) hypertension: Secondary | ICD-10-CM | POA: Diagnosis not present

## 2017-09-05 DIAGNOSIS — R251 Tremor, unspecified: Secondary | ICD-10-CM

## 2017-09-05 LAB — CBC
HCT: 45.5 % (ref 39.0–52.0)
Hemoglobin: 15.6 g/dL (ref 13.0–17.0)
MCHC: 34.3 g/dL (ref 30.0–36.0)
MCV: 95.4 fl (ref 78.0–100.0)
Platelets: 243 10*3/uL (ref 150.0–400.0)
RBC: 4.77 Mil/uL (ref 4.22–5.81)
RDW: 15.7 % — AB (ref 11.5–15.5)
WBC: 8.9 10*3/uL (ref 4.0–10.5)

## 2017-09-05 LAB — HEMOGLOBIN A1C: HEMOGLOBIN A1C: 8.1 % — AB (ref 4.6–6.5)

## 2017-09-05 LAB — LIPID PANEL
CHOLESTEROL: 185 mg/dL (ref 0–200)
HDL: 58.7 mg/dL (ref >39.00)
LDL Cholesterol: 90 mg/dL (ref 0–99)
NONHDL: 126.57
TRIGLYCERIDES: 182 mg/dL — AB (ref 0.0–149.0)
Total CHOL/HDL Ratio: 3
VLDL: 36.4 mg/dL (ref 0.0–40.0)

## 2017-09-05 LAB — COMPREHENSIVE METABOLIC PANEL
ALBUMIN: 3.9 g/dL (ref 3.5–5.2)
ALK PHOS: 141 U/L — AB (ref 39–117)
ALT: 15 U/L (ref 0–53)
AST: 10 U/L (ref 0–37)
BUN: 15 mg/dL (ref 6–23)
CALCIUM: 10 mg/dL (ref 8.4–10.5)
CHLORIDE: 98 meq/L (ref 96–112)
CO2: 33 mEq/L — ABNORMAL HIGH (ref 19–32)
Creatinine, Ser: 1.14 mg/dL (ref 0.40–1.50)
GFR: 81.09 mL/min (ref >60.00)
Glucose, Bld: 260 mg/dL — ABNORMAL HIGH (ref 70–99)
POTASSIUM: 3.5 meq/L (ref 3.5–5.1)
SODIUM: 138 meq/L (ref 135–145)
TOTAL PROTEIN: 7.2 g/dL (ref 6.0–8.3)
Total Bilirubin: 0.3 mg/dL (ref 0.2–1.2)

## 2017-09-05 NOTE — Progress Notes (Signed)
   Subjective:    Patient ID: Drew Harrington, male    DOB: [birth year], 72 y.o.   MRN: [id number]  HPI The patient is a 72 YO man coming in for wanting referral to neurology for tremors. He has been kicked out of several around the area for failure to keep visits and comply. He does take propranolol and primidone for this. He is having some problems still with his handwriting. He still works and is able to do most of the work on the computer to overcome his tremor. He is very self conscious about this. He does not feel it is fair that he has to go to [place] for it.  He also is having uncontrolled blood pressure and admits to taking them everyday. He does not check pressure at home but feels that it is under good control. Denies headaches or chest pains. He denies side effects from medications.  He also has not been followed for his diabetes. He denies taking his metformin lately for this. He is willing to go back on it if needed. Denies new numbness or burning. He is on ARB for BP and not taking his statin lately either.   Review of Systems  Constitutional: Negative.   HENT: Negative.   Eyes: Negative.   Respiratory: Negative for cough, chest tightness and shortness of breath.   Cardiovascular: Negative for chest pain, palpitations and leg swelling.  Gastrointestinal: Negative for abdominal distention, abdominal pain, constipation, diarrhea, nausea and vomiting.  Musculoskeletal: Negative.   Skin: Negative.   Neurological: Positive for tremors. Negative for dizziness, weakness, light-headedness, numbness and headaches.  Psychiatric/Behavioral: Negative.       Objective:   Physical Exam  Constitutional: He is oriented to person, place, and time. He appears well-developed and well-nourished.  HENT:  Head: Normocephalic and atraumatic.  Eyes: EOM are normal.  Neck: Normal range of motion.  Cardiovascular: Normal rate and regular rhythm.  Pulmonary/Chest: Effort normal and breath sounds  normal. No respiratory distress. He has no wheezes. He has no rales.  Abdominal: Soft. Bowel sounds are normal. He exhibits no distension. There is no tenderness. There is no rebound.  Musculoskeletal: He exhibits no edema.  Neurological: He is alert and oriented to person, place, and time. Coordination normal.  No tremor visible on exam  Skin: Skin is warm and dry.  Psychiatric: He has a normal mood and affect.   Vitals:   09/05/17 1255  BP: (!) 170/100  Pulse: 85  Temp: 98.8 F (37.1 C)  TempSrc: Oral  SpO2: 95%  Weight: 205 lb (93 kg)  Height: 5\' 9"  (1.753 m)      Assessment & Plan:

## 2017-09-05 NOTE — Patient Instructions (Signed)
We will get the referral done for the neurologist and are checking the blood work today.

## 2017-09-06 NOTE — Assessment & Plan Note (Signed)
Checking lipid panel and adjust as needed. Taking simvastatin but compliance is poor.

## 2017-09-06 NOTE — Assessment & Plan Note (Signed)
Needs HgA1c as none in 6 months. Previous well controlled although he is not taking metformin at this time. He does not come for regular follow up and reminded him of the importance of this.

## 2017-09-06 NOTE — Assessment & Plan Note (Signed)
No visible tremor on exam but claims handwriting is poor. Referral done to neurology as dosing of primidone is high and management by neurology is recommended as I am unwilling to prescribe long term.

## 2017-09-06 NOTE — Assessment & Plan Note (Signed)
He claims to be taking amlodipine 10 mg daily, losartan/hctz 100/25 mg daily and propranolol 120 mg daily for his blood pressure but has not taken today and declines any medication changes today. Recommend follow up in 1 month and BP monitoring at home with home meter to check home levels as he claims being at the doctor raises his levels. Checking CMP and adjust as needed.

## 2017-09-09 ENCOUNTER — Other Ambulatory Visit: Payer: Self-pay | Admitting: Internal Medicine

## 2017-09-09 DIAGNOSIS — E119 Type 2 diabetes mellitus without complications: Secondary | ICD-10-CM

## 2017-09-09 MED ORDER — METFORMIN HCL 1000 MG PO TABS: 1000.0000 mg | ORAL_TABLET | Freq: Two times a day (BID) | ORAL | 0 refills | Status: DC

## 2017-10-05 ENCOUNTER — Other Ambulatory Visit: Payer: Self-pay | Admitting: Internal Medicine

## 2017-10-05 DIAGNOSIS — G250 Essential tremor: Secondary | ICD-10-CM

## 2017-11-17 ENCOUNTER — Other Ambulatory Visit: Payer: Self-pay | Admitting: Internal Medicine

## 2017-11-17 DIAGNOSIS — G250 Essential tremor: Secondary | ICD-10-CM

## 2017-11-17 NOTE — Telephone Encounter (Signed)
Has he seen the neurologist yet? They should be prescribing. If not has he scheduled/when is visit.

## 2017-11-18 NOTE — Telephone Encounter (Signed)
Patient has an appointment  With neurology on June 20th

## 2017-11-22 DIAGNOSIS — G4733 Obstructive sleep apnea (adult) (pediatric): Secondary | ICD-10-CM | POA: Diagnosis not present

## 2017-12-15 DIAGNOSIS — G250 Essential tremor: Secondary | ICD-10-CM | POA: Diagnosis not present

## 2017-12-19 DIAGNOSIS — G4733 Obstructive sleep apnea (adult) (pediatric): Secondary | ICD-10-CM | POA: Diagnosis not present

## 2018-01-14 ENCOUNTER — Other Ambulatory Visit: Payer: Self-pay | Admitting: Nurse Practitioner

## 2018-01-14 DIAGNOSIS — I10 Essential (primary) hypertension: Secondary | ICD-10-CM

## 2018-02-15 DIAGNOSIS — G250 Essential tremor: Secondary | ICD-10-CM | POA: Diagnosis not present

## 2018-02-20 DIAGNOSIS — G4733 Obstructive sleep apnea (adult) (pediatric): Secondary | ICD-10-CM | POA: Diagnosis not present

## 2018-03-01 ENCOUNTER — Other Ambulatory Visit: Payer: Self-pay | Admitting: Internal Medicine

## 2018-03-01 ENCOUNTER — Other Ambulatory Visit: Payer: Self-pay | Admitting: Nurse Practitioner

## 2018-03-01 DIAGNOSIS — E119 Type 2 diabetes mellitus without complications: Secondary | ICD-10-CM

## 2018-03-01 DIAGNOSIS — I10 Essential (primary) hypertension: Secondary | ICD-10-CM

## 2018-03-01 DIAGNOSIS — E1169 Type 2 diabetes mellitus with other specified complication: Secondary | ICD-10-CM

## 2018-03-01 DIAGNOSIS — E785 Hyperlipidemia, unspecified: Principal | ICD-10-CM

## 2018-03-02 ENCOUNTER — Other Ambulatory Visit: Payer: Self-pay

## 2018-03-02 ENCOUNTER — Other Ambulatory Visit: Payer: Self-pay | Admitting: Internal Medicine

## 2018-03-02 DIAGNOSIS — I10 Essential (primary) hypertension: Secondary | ICD-10-CM

## 2018-03-02 NOTE — Patient Outreach (Signed)
[place] [facility]) Care Management  03/02/2018  Drew Harrington 19-Jun-1946 [id number]   Medication Adherence call to [name] spoke with patient he said he order both medication from [facility] and will pick up in the next couple of days patient is due on Simvastatin 20 mg and Metformin 1000. [name] is showing past due under [place].  [place] Management Direct Dial [phone]  Fax [phone] [email or web address]

## 2018-04-25 ENCOUNTER — Other Ambulatory Visit: Payer: Self-pay | Admitting: Nurse Practitioner

## 2018-04-25 DIAGNOSIS — I10 Essential (primary) hypertension: Secondary | ICD-10-CM

## 2018-05-22 DIAGNOSIS — G4733 Obstructive sleep apnea (adult) (pediatric): Secondary | ICD-10-CM | POA: Diagnosis not present

## 2018-06-07 DIAGNOSIS — G250 Essential tremor: Secondary | ICD-10-CM | POA: Diagnosis not present

## 2018-06-08 ENCOUNTER — Other Ambulatory Visit: Payer: Self-pay

## 2018-06-08 NOTE — Patient Outreach (Signed)
[place] [facility]) Care Management  06/08/2018  AVYON HERENDEEN 1946/01/20 [id number]   Medication Adherence call to Mr. Rahman Ferrall spoke with patient he is due on Metformin 1000 mg he said he still has medication at this time and does not need any he has enough until next year. Mr. Buckner is showing past due under [place].   [place] Management Direct Dial [phone]  Fax [phone] [email or web address]

## 2018-06-19 DIAGNOSIS — G4733 Obstructive sleep apnea (adult) (pediatric): Secondary | ICD-10-CM | POA: Diagnosis not present

## 2018-07-12 ENCOUNTER — Other Ambulatory Visit (INDEPENDENT_AMBULATORY_CARE_PROVIDER_SITE_OTHER): Payer: Medicare Other

## 2018-07-12 ENCOUNTER — Ambulatory Visit (INDEPENDENT_AMBULATORY_CARE_PROVIDER_SITE_OTHER): Payer: Medicare Other | Admitting: Internal Medicine

## 2018-07-12 ENCOUNTER — Encounter: Payer: Self-pay | Admitting: Internal Medicine

## 2018-07-12 VITALS — BP 126/88 | HR 80 | Temp 97.9°F | Ht 69.0 in | Wt 200.0 lb

## 2018-07-12 DIAGNOSIS — E785 Hyperlipidemia, unspecified: Secondary | ICD-10-CM

## 2018-07-12 DIAGNOSIS — E119 Type 2 diabetes mellitus without complications: Secondary | ICD-10-CM | POA: Diagnosis not present

## 2018-07-12 DIAGNOSIS — E1169 Type 2 diabetes mellitus with other specified complication: Secondary | ICD-10-CM

## 2018-07-12 DIAGNOSIS — I10 Essential (primary) hypertension: Secondary | ICD-10-CM | POA: Diagnosis not present

## 2018-07-12 DIAGNOSIS — Z23 Encounter for immunization: Secondary | ICD-10-CM

## 2018-07-12 DIAGNOSIS — M25471 Effusion, right ankle: Secondary | ICD-10-CM | POA: Diagnosis not present

## 2018-07-12 LAB — LIPID PANEL
Cholesterol: 181 mg/dL (ref 0–200)
HDL: 56.4 mg/dL (ref >39.00)
LDL Cholesterol: 103 mg/dL — ABNORMAL HIGH (ref 0–99)
NonHDL: 124.87
Total CHOL/HDL Ratio: 3
Triglycerides: 107 mg/dL (ref 0.0–149.0)
VLDL: 21.4 mg/dL (ref 0.0–40.0)

## 2018-07-12 LAB — COMPREHENSIVE METABOLIC PANEL
ALT: 21 U/L (ref 0–53)
AST: 18 U/L (ref 0–37)
Albumin: 4.2 g/dL (ref 3.5–5.2)
Alkaline Phosphatase: 173 U/L — ABNORMAL HIGH (ref 39–117)
BUN: 14 mg/dL (ref 6–23)
CO2: 33 meq/L — AB (ref 19–32)
Calcium: 10.2 mg/dL (ref 8.4–10.5)
Chloride: 99 mEq/L (ref 96–112)
Creatinine, Ser: 1.17 mg/dL (ref 0.40–1.50)
GFR: 78.51 mL/min (ref >60.00)
Glucose, Bld: 94 mg/dL (ref 70–99)
Potassium: 3.6 mEq/L (ref 3.5–5.1)
SODIUM: 140 meq/L (ref 135–145)
Total Bilirubin: 0.4 mg/dL (ref 0.2–1.2)
Total Protein: 7.6 g/dL (ref 6.0–8.3)

## 2018-07-12 LAB — CBC
HCT: 45.9 % (ref 39.0–52.0)
Hemoglobin: 15.6 g/dL (ref 13.0–17.0)
MCHC: 34 g/dL (ref 30.0–36.0)
MCV: 98.5 fl (ref 78.0–100.0)
Platelets: 261 10*3/uL (ref 150.0–400.0)
RBC: 4.66 Mil/uL (ref 4.22–5.81)
RDW: 15.5 % (ref 11.5–15.5)
WBC: 7.9 10*3/uL (ref 4.0–10.5)

## 2018-07-12 LAB — MICROALBUMIN / CREATININE URINE RATIO
Creatinine,U: 54.7 mg/dL
Microalb Creat Ratio: 15.1 mg/g (ref 0.0–30.0)
Microalb, Ur: 8.2 mg/dL — ABNORMAL HIGH (ref 0.0–1.9)

## 2018-07-12 LAB — BRAIN NATRIURETIC PEPTIDE: Pro B Natriuretic peptide (BNP): 115 pg/mL — ABNORMAL HIGH (ref 0.0–100.0)

## 2018-07-12 LAB — HEMOGLOBIN A1C: Hgb A1c MFr Bld: 6.4 % (ref 4.6–6.5)

## 2018-07-12 NOTE — Assessment & Plan Note (Signed)
Checking CMP and BNP. Could be related to dietary changes and given DASH diet information.

## 2018-07-12 NOTE — Patient Instructions (Addendum)
We will check the blood work today.   Work on lowering the sodium or salt in your diet. Most salt is already in your food and not from the salt shaker.   Come back in 3 months DASH Eating Plan DASH stands for "Dietary Approaches to Stop Hypertension." The DASH eating plan is a healthy eating plan that has been shown to reduce high blood pressure (hypertension). It may also reduce your risk for type 2 diabetes, heart disease, and stroke. The DASH eating plan may also help with weight loss. What are tips for following this plan?  General guidelines  Avoid eating more than 2,300 mg (milligrams) of salt (sodium) a day. If you have hypertension, you may need to reduce your sodium intake to 1,500 mg a day.  Limit alcohol intake to no more than 1 drink a day for nonpregnant women and 2 drinks a day for men. One drink equals 12 oz of beer, 5 oz of wine, or 1 oz of hard liquor.  Work with your health care provider to maintain a healthy body weight or to lose weight. Ask what an ideal weight is for you.  Get at least 30 minutes of exercise that causes your heart to beat faster (aerobic exercise) most days of the week. Activities may include walking, swimming, or biking.  Work with your health care provider or diet and nutrition specialist (dietitian) to adjust your eating plan to your individual calorie needs. Reading food labels   Check food labels for the amount of sodium per serving. Choose foods with less than 5 percent of the Daily Value of sodium. Generally, foods with less than 300 mg of sodium per serving fit into this eating plan.  To find whole grains, look for the word "whole" as the first word in the ingredient list. Shopping  Buy products labeled as "low-sodium" or "no salt added."  Buy fresh foods. Avoid canned foods and premade or frozen meals. Cooking  Avoid adding salt when cooking. Use salt-free seasonings or herbs instead of table salt or sea salt. Check with your health  care provider or pharmacist before using salt substitutes.  Do not fry foods. Cook foods using healthy methods such as baking, boiling, grilling, and broiling instead.  Cook with heart-healthy oils, such as olive, canola, soybean, or sunflower oil. Meal planning  Eat a balanced diet that includes: ? 5 or more servings of fruits and vegetables each day. At each meal, try to fill half of your plate with fruits and vegetables. ? Up to 6-8 servings of whole grains each day. ? Less than 6 oz of lean meat, poultry, or fish each day. A 3-oz serving of meat is about the same size as a deck of cards. One egg equals 1 oz. ? 2 servings of low-fat dairy each day. ? A serving of nuts, seeds, or beans 5 times each week. ? Heart-healthy fats. Healthy fats called Omega-3 fatty acids are found in foods such as flaxseeds and coldwater fish, like sardines, salmon, and mackerel.  Limit how much you eat of the following: ? Canned or prepackaged foods. ? Food that is high in trans fat, such as fried foods. ? Food that is high in saturated fat, such as fatty meat. ? Sweets, desserts, sugary drinks, and other foods with added sugar. ? Full-fat dairy products.  Do not salt foods before eating.  Try to eat at least 2 vegetarian meals each week.  Eat more home-cooked food and less restaurant, buffet, and fast  food.  When eating at a restaurant, ask that your food be prepared with less salt or no salt, if possible. What foods are recommended? The items listed may not be a complete list. Talk with your dietitian about what dietary choices are best for you. Grains Whole-grain or whole-wheat bread. Whole-grain or whole-wheat pasta. Brown rice. Drew Harrington. Bulgur. Whole-grain and low-sodium cereals. Pita bread. Low-fat, low-sodium crackers. Whole-wheat flour tortillas. Vegetables Fresh or frozen vegetables (raw, steamed, roasted, or grilled). Low-sodium or reduced-sodium tomato and vegetable juice. Low-sodium  or reduced-sodium tomato sauce and tomato paste. Low-sodium or reduced-sodium canned vegetables. Fruits All fresh, dried, or frozen fruit. Canned fruit in natural juice (without added sugar). Meat and other protein foods Skinless chicken or [place]. Ground chicken or [place]. Pork with fat trimmed off. Fish and seafood. Egg whites. Dried beans, peas, or lentils. Unsalted nuts, nut butters, and seeds. Unsalted canned beans. Lean cuts of beef with fat trimmed off. Low-sodium, lean deli meat. Dairy Low-fat (1%) or fat-free (skim) milk. Fat-free, low-fat, or reduced-fat cheeses. Nonfat, low-sodium ricotta or cottage cheese. Low-fat or nonfat yogurt. Low-fat, low-sodium cheese. Fats and oils Soft margarine without trans fats. Vegetable oil. Low-fat, reduced-fat, or light mayonnaise and salad dressings (reduced-sodium). Canola, safflower, olive, soybean, and sunflower oils. Avocado. Seasoning and other foods Herbs. Spices. Seasoning mixes without salt. Unsalted popcorn and pretzels. Fat-free sweets. What foods are not recommended? The items listed may not be a complete list. Talk with your dietitian about what dietary choices are best for you. Grains Baked goods made with fat, such as croissants, muffins, or some breads. Dry pasta or rice meal packs. Vegetables Creamed or fried vegetables. Vegetables in a cheese sauce. Regular canned vegetables (not low-sodium or reduced-sodium). Regular canned tomato sauce and paste (not low-sodium or reduced-sodium). Regular tomato and vegetable juice (not low-sodium or reduced-sodium). Drew Harrington. Olives. Fruits Canned fruit in a light or heavy syrup. Fried fruit. Fruit in cream or butter sauce. Meat and other protein foods Fatty cuts of meat. Ribs. Fried meat. Drew Harrington. Sausage. Bologna and other processed lunch meats. Salami. Fatback. Hotdogs. Bratwurst. Salted nuts and seeds. Canned beans with added salt. Canned or smoked fish. Whole eggs or egg yolks. Chicken or [place]  with skin. Dairy Whole or 2% milk, cream, and half-and-half. Whole or full-fat cream cheese. Whole-fat or sweetened yogurt. Full-fat cheese. Nondairy creamers. Whipped toppings. Processed cheese and cheese spreads. Fats and oils Butter. Stick margarine. Lard. Shortening. Ghee. Bacon fat. Tropical oils, such as coconut, palm kernel, or palm oil. Seasoning and other foods Salted popcorn and pretzels. Onion salt, garlic salt, seasoned salt, table salt, and sea salt. Worcestershire sauce. Tartar sauce. Barbecue sauce. Teriyaki sauce. Soy sauce, including reduced-sodium. Steak sauce. Canned and packaged gravies. Fish sauce. Oyster sauce. Cocktail sauce. Horseradish that you find on the shelf. Ketchup. Mustard. Meat flavorings and tenderizers. Bouillon cubes. Hot sauce and Tabasco sauce. Premade or packaged marinades. Premade or packaged taco seasonings. Relishes. Regular salad dressings. Where to find more information:  National Heart, Lung, and [place]: https://wilson-eaton.com/  American Heart Association: www.heart.org Summary  The DASH eating plan is a healthy eating plan that has been shown to reduce high blood pressure (hypertension). It may also reduce your risk for type 2 diabetes, heart disease, and stroke.  With the DASH eating plan, you should limit salt (sodium) intake to 2,300 mg a day. If you have hypertension, you may need to reduce your sodium intake to 1,500 mg a day.  When on the DASH  eating plan, aim to eat more fresh fruits and vegetables, whole grains, lean proteins, low-fat dairy, and heart-healthy fats.  Work with your health care provider or diet and nutrition specialist (dietitian) to adjust your eating plan to your individual calorie needs. This information is not intended to replace advice given to you by your health care provider. Make sure you discuss any questions you have with your health care provider. Document Released: 06/03/2011 Document Revised: 06/07/2016  Document Reviewed: 06/07/2016 Elsevier Interactive Patient Education  [employer].  for a check on diabetes.

## 2018-07-12 NOTE — Progress Notes (Signed)
   Subjective:   Patient ID: Drew Harrington, male    DOB: May 21, 1946, 73 y.o.   MRN: [id number]  HPI The patient is a 73 YO man coming in for new ankle swelling (started about 1 month or so ago, he is eating different since the holidays and drinking more sodas in the evening, he switched from dark soda to ginger ale to see if it would help) and follow up blood pressure (is actually taking medicine lately propranolol and losartan/hctz and amlodipine, denies side effects, denies headaches or chest pains) and diabetes (has increased metformin to 1000 mg BID but never followed up for this, denies new numbness or tingling, denies symptoms such as more thirst or more urination, he is drinking a lot more sodas in the evenings lately).   Review of Systems  Constitutional: Negative.   HENT: Negative.   Eyes: Negative.   Respiratory: Negative for cough, chest tightness and shortness of breath.   Cardiovascular: Positive for leg swelling. Negative for chest pain and palpitations.  Gastrointestinal: Negative for abdominal distention, abdominal pain, constipation, diarrhea, nausea and vomiting.  Musculoskeletal: Negative.   Skin: Negative.   Neurological: Negative.   Psychiatric/Behavioral: Negative.     Objective:  Physical Exam Constitutional:      Appearance: He is well-developed.  HENT:     Head: Normocephalic and atraumatic.  Neck:     Musculoskeletal: Normal range of motion.  Cardiovascular:     Rate and Rhythm: Normal rate and regular rhythm.  Pulmonary:     Effort: Pulmonary effort is normal. No respiratory distress.     Breath sounds: Normal breath sounds. No wheezing or rales.  Abdominal:     General: Bowel sounds are normal. There is no distension.     Palpations: Abdomen is soft.     Tenderness: There is no abdominal tenderness. There is no rebound.  Skin:    General: Skin is warm and dry.     Comments: Foot exam done  Neurological:     Mental Status: He is alert and oriented  to person, place, and time.     Coordination: Coordination normal.     Vitals:   07/12/18 0857  BP: 126/88  Pulse: 80  Temp: 97.9 F (36.6 C)  TempSrc: Oral  SpO2: 97%  Weight: 200 lb (90.7 kg)  Height: 5\' 9"  (1.753 m)    Assessment & Plan:  Tdap given at visit

## 2018-07-12 NOTE — Assessment & Plan Note (Signed)
BP at goal and on meds currently. Taking amlodipine, losartan/hctz and propranolol. Checking CMP and adjust as needed.

## 2018-07-12 NOTE — Assessment & Plan Note (Signed)
Checking HgA1c, microalbumin to creatinine ratio and lipid panel. He did not follow up for elevated HgA1c but did increase metformin to max dosing. Will adjust as needed. Is on ARB and statin. Needs eye exam and reminded. Foot exam done.

## 2018-07-12 NOTE — Assessment & Plan Note (Signed)
Checking lipid panel and adjust simvastatin 20 mg daily as needed.  

## 2018-07-13 ENCOUNTER — Encounter: Payer: Self-pay | Admitting: Internal Medicine

## 2018-07-13 NOTE — Progress Notes (Signed)
error 

## 2018-07-21 ENCOUNTER — Other Ambulatory Visit: Payer: Self-pay | Admitting: Internal Medicine

## 2018-07-21 DIAGNOSIS — I10 Essential (primary) hypertension: Secondary | ICD-10-CM

## 2018-08-18 ENCOUNTER — Other Ambulatory Visit: Payer: Self-pay | Admitting: Internal Medicine

## 2018-08-18 DIAGNOSIS — I10 Essential (primary) hypertension: Secondary | ICD-10-CM

## 2018-08-21 DIAGNOSIS — G4733 Obstructive sleep apnea (adult) (pediatric): Secondary | ICD-10-CM | POA: Diagnosis not present

## 2018-08-23 ENCOUNTER — Encounter: Payer: Self-pay | Admitting: Internal Medicine

## 2018-09-16 ENCOUNTER — Other Ambulatory Visit: Payer: Self-pay | Admitting: Internal Medicine

## 2018-09-16 DIAGNOSIS — I10 Essential (primary) hypertension: Secondary | ICD-10-CM

## 2018-10-28 ENCOUNTER — Other Ambulatory Visit: Payer: Self-pay | Admitting: Internal Medicine

## 2018-10-28 DIAGNOSIS — I10 Essential (primary) hypertension: Secondary | ICD-10-CM

## 2018-11-06 DIAGNOSIS — G250 Essential tremor: Secondary | ICD-10-CM | POA: Diagnosis not present

## 2018-11-13 ENCOUNTER — Other Ambulatory Visit: Payer: Self-pay

## 2018-11-13 NOTE — Patient Outreach (Signed)
[place] [facility]) Care Management  11/13/2018  ORLIN KANN 1945-10-28 [id number]   Medication Adherence call to Mr. Mason Dibiasio left a message with patient's wife to call back patient is due on Metformin 1000 under [place].   [facility] Management Direct Dial [phone]  Fax [phone] [email or web address]

## 2018-11-20 DIAGNOSIS — G4733 Obstructive sleep apnea (adult) (pediatric): Secondary | ICD-10-CM | POA: Diagnosis not present

## 2018-11-28 ENCOUNTER — Other Ambulatory Visit: Payer: Self-pay

## 2018-11-28 NOTE — Patient Outreach (Signed)
[facility] [facility]) Care Management  11/28/2018  AKONI PARTON August 19, 1945 [id number]   Medication Adherence call to Mr. Duwane Gewirtz HIPPA Compliant Voice message left with a call back number. Mr. Sensabaugh is showing past due on Losartan/Hctz 100/25, Simvastatin 20 mg and Metformin 1000 mg under [place].   [place] Management Direct Dial [phone]  Fax [phone] [email or web address]

## 2018-12-03 ENCOUNTER — Other Ambulatory Visit: Payer: Self-pay | Admitting: Internal Medicine

## 2018-12-03 DIAGNOSIS — E119 Type 2 diabetes mellitus without complications: Secondary | ICD-10-CM

## 2019-01-05 ENCOUNTER — Telehealth: Payer: Self-pay | Admitting: Internal Medicine

## 2019-01-05 NOTE — Telephone Encounter (Signed)
Pt declined to schedule AWV at this time. SF

## 2019-01-10 ENCOUNTER — Encounter: Payer: Self-pay | Admitting: Internal Medicine

## 2019-01-10 ENCOUNTER — Ambulatory Visit (INDEPENDENT_AMBULATORY_CARE_PROVIDER_SITE_OTHER): Payer: Medicare Other | Admitting: Internal Medicine

## 2019-01-10 ENCOUNTER — Other Ambulatory Visit: Payer: Self-pay

## 2019-01-10 ENCOUNTER — Other Ambulatory Visit (INDEPENDENT_AMBULATORY_CARE_PROVIDER_SITE_OTHER): Payer: Medicare Other

## 2019-01-10 VITALS — BP 170/110 | HR 82 | Temp 98.5°F | Ht 69.0 in | Wt 201.0 lb

## 2019-01-10 DIAGNOSIS — E118 Type 2 diabetes mellitus with unspecified complications: Secondary | ICD-10-CM

## 2019-01-10 DIAGNOSIS — R29898 Other symptoms and signs involving the musculoskeletal system: Secondary | ICD-10-CM | POA: Diagnosis not present

## 2019-01-10 DIAGNOSIS — E119 Type 2 diabetes mellitus without complications: Secondary | ICD-10-CM | POA: Diagnosis not present

## 2019-01-10 DIAGNOSIS — I10 Essential (primary) hypertension: Secondary | ICD-10-CM | POA: Diagnosis not present

## 2019-01-10 LAB — COMPREHENSIVE METABOLIC PANEL
ALT: 19 U/L (ref 0–53)
AST: 15 U/L (ref 0–37)
Albumin: 4.3 g/dL (ref 3.5–5.2)
Alkaline Phosphatase: 217 U/L — ABNORMAL HIGH (ref 39–117)
BUN: 20 mg/dL (ref 6–23)
CO2: 30 mEq/L (ref 19–32)
Calcium: 9.9 mg/dL (ref 8.4–10.5)
Chloride: 102 mEq/L (ref 96–112)
Creatinine, Ser: 1.18 mg/dL (ref 0.40–1.50)
GFR: 73.05 mL/min (ref >60.00)
Glucose, Bld: 111 mg/dL — ABNORMAL HIGH (ref 70–99)
Potassium: 3.8 mEq/L (ref 3.5–5.1)
Sodium: 142 mEq/L (ref 135–145)
Total Bilirubin: 0.3 mg/dL (ref 0.2–1.2)
Total Protein: 7.7 g/dL (ref 6.0–8.3)

## 2019-01-10 LAB — CBC
HCT: 47.3 % (ref 39.0–52.0)
Hemoglobin: 16.1 g/dL (ref 13.0–17.0)
MCHC: 34 g/dL (ref 30.0–36.0)
MCV: 99.9 fl (ref 78.0–100.0)
Platelets: 268 10*3/uL (ref 150.0–400.0)
RBC: 4.73 Mil/uL (ref 4.22–5.81)
RDW: 15.8 % — ABNORMAL HIGH (ref 11.5–15.5)
WBC: 8.8 10*3/uL (ref 4.0–10.5)

## 2019-01-10 LAB — LIPID PANEL
Cholesterol: 192 mg/dL (ref 0–200)
HDL: 56.4 mg/dL (ref >39.00)
LDL Cholesterol: 113 mg/dL — ABNORMAL HIGH (ref 0–99)
NonHDL: 135.97
Total CHOL/HDL Ratio: 3
Triglycerides: 116 mg/dL (ref 0.0–149.0)
VLDL: 23.2 mg/dL (ref 0.0–40.0)

## 2019-01-10 LAB — HEMOGLOBIN A1C: Hgb A1c MFr Bld: 6.4 % (ref 4.6–6.5)

## 2019-01-10 LAB — CK: Total CK: 78 U/L (ref 7–232)

## 2019-01-10 LAB — MAGNESIUM: Magnesium: 1.9 mg/dL (ref 1.5–2.5)

## 2019-01-10 NOTE — Patient Instructions (Signed)
We are checking the blood work today.   Make sure to go home and check if your medicines match the ones on your medicine list and if they do not let us know what medicines you are taking as you may be missing one of the blood pressure medicines.

## 2019-01-10 NOTE — Assessment & Plan Note (Signed)
Patient denies this and does not want further work up for this.

## 2019-01-10 NOTE — Assessment & Plan Note (Signed)
Complicated with hyperlipidemia. Unclear if he is taking metformin as notes from his insurance with compliance concerns several in the last few months. Checking HgA1c today and adjust metformin as needed.

## 2019-01-10 NOTE — Assessment & Plan Note (Signed)
It is unclear if he is taking his medication lately. I see several notes from [facility] with problems with compliance of filling BP meds. He does not want to make changes today. Checking CMP for signs of change in kidney function.

## 2019-01-10 NOTE — Progress Notes (Signed)
   Subjective:   Patient ID: Drew Harrington, male    DOB: [birth year], 73 y.o.   MRN: [id number]  HPI The patient is a 73 YO man coming in for he is unclear. His wife scheduled him this visit as she thought his right leg was dragging some after work. He does not feel that this is an issue. He did not notice this problem when his wife noticed it. He denies the leg feeling heavy. Denies missing any medications recently. He has taken his medications already this morning. Cannot name them and goes back and forth on how many medications he is taking. Denies headaches or chest pains. He states his BP is labile and not sure if it is normal at home. He has been having diarrhea associated with his metformin and needs to be close to a bathroom after he takes it. He admits to taking it twice a day still.   PMH, [facility], social history reviewed and updated  Review of Systems  Constitutional: Negative.   HENT: Negative.   Eyes: Negative.   Respiratory: Negative for cough, chest tightness and shortness of breath.   Cardiovascular: Negative for chest pain, palpitations and leg swelling.  Gastrointestinal: Negative for abdominal distention, abdominal pain, constipation, diarrhea, nausea and vomiting.  Musculoskeletal: Negative.   Skin: Negative.   Neurological: Positive for tremors.  Psychiatric/Behavioral: Negative.     Objective:  Physical Exam Constitutional:      Appearance: He is well-developed.  HENT:     Head: Normocephalic and atraumatic.  Neck:     Musculoskeletal: Normal range of motion.  Cardiovascular:     Rate and Rhythm: Normal rate and regular rhythm.  Pulmonary:     Effort: Pulmonary effort is normal. No respiratory distress.     Breath sounds: Normal breath sounds. No wheezing or rales.  Abdominal:     General: Bowel sounds are normal. There is no distension.     Palpations: Abdomen is soft.     Tenderness: There is no abdominal tenderness. There is no rebound.  Skin:    General:  Skin is warm and dry.  Neurological:     Mental Status: He is alert and oriented to person, place, and time.     Coordination: Coordination normal.     Vitals:   01/10/19 0904 01/10/19 0927  BP: (!) 180/120 (!) 170/110  Pulse: 82   Temp: 98.5 F (36.9 C)   TempSrc: Oral   SpO2: 97%   Weight: 201 lb (91.2 kg)   Height: 5\' 9"  (1.753 m)     Assessment & Plan:

## 2019-01-21 ENCOUNTER — Other Ambulatory Visit: Payer: Self-pay | Admitting: Internal Medicine

## 2019-01-21 DIAGNOSIS — I10 Essential (primary) hypertension: Secondary | ICD-10-CM

## 2019-02-28 ENCOUNTER — Other Ambulatory Visit: Payer: Self-pay | Admitting: Internal Medicine

## 2019-02-28 DIAGNOSIS — I10 Essential (primary) hypertension: Secondary | ICD-10-CM

## 2019-03-12 ENCOUNTER — Ambulatory Visit (INDEPENDENT_AMBULATORY_CARE_PROVIDER_SITE_OTHER): Payer: Medicare Other | Admitting: Internal Medicine

## 2019-03-12 ENCOUNTER — Other Ambulatory Visit: Payer: Self-pay

## 2019-03-12 ENCOUNTER — Encounter: Payer: Self-pay | Admitting: Internal Medicine

## 2019-03-12 VITALS — BP 140/100 | HR 78 | Temp 98.8°F | Ht 69.0 in | Wt 200.0 lb

## 2019-03-12 DIAGNOSIS — R29818 Other symptoms and signs involving the nervous system: Secondary | ICD-10-CM

## 2019-03-12 DIAGNOSIS — I10 Essential (primary) hypertension: Secondary | ICD-10-CM

## 2019-03-12 DIAGNOSIS — G250 Essential tremor: Secondary | ICD-10-CM | POA: Diagnosis not present

## 2019-03-12 DIAGNOSIS — R29898 Other symptoms and signs involving the musculoskeletal system: Secondary | ICD-10-CM

## 2019-03-12 DIAGNOSIS — E118 Type 2 diabetes mellitus with unspecified complications: Secondary | ICD-10-CM

## 2019-03-12 MED ORDER — CLONIDINE 0.2 MG/24HR TD PTWK: 0.2000 mg | MEDICATED_PATCH | TRANSDERMAL | 12 refills | Status: DC

## 2019-03-12 NOTE — Progress Notes (Signed)
   Subjective:   Patient ID: Drew Harrington, male    DOB: [birth year], 73 y.o.   MRN: [id number]  HPI The patient is a 73 YO man coming in for several concerns with his wife including tremor (they see neurology but his wife feels that even with the years of treatment his symptoms are not improving, the medication helps, he cannot write well, he denies worsening lately, he himself decreased his medication from 3 pills daily to 2 and has not noticed a lot of change in symptoms with this), and leg weakness (he came in back in July at her request but was not concerned about this, she felt his legs are weak, she adds today that she thinks his leg is dragging some, this is in the last 3 months or so, denies numbness in the leg, does have some weakness in his legs hard to describe), and leg swelling (we had previously talked about how amlodipine could be causing some leg swelling versus other causes, he was not interested, his wife is interested and she had not heard any of this, he is still having some mild leg swelling, still admits to taking amlodipine).   Review of Systems  Constitutional: Positive for activity change.  HENT: Negative.   Eyes: Negative.   Respiratory: Negative for cough, chest tightness and shortness of breath.   Cardiovascular: Positive for leg swelling. Negative for chest pain and palpitations.  Gastrointestinal: Negative for abdominal distention, abdominal pain, constipation, diarrhea, nausea and vomiting.  Musculoskeletal: Negative.   Skin: Negative.   Neurological: Positive for tremors and weakness.  Psychiatric/Behavioral: Negative.     Objective:  Physical Exam Constitutional:      Appearance: He is well-developed. He is obese.  HENT:     Head: Normocephalic and atraumatic.  Neck:     Musculoskeletal: Normal range of motion.  Cardiovascular:     Rate and Rhythm: Normal rate and regular rhythm.  Pulmonary:     Effort: Pulmonary effort is normal. No respiratory  distress.     Breath sounds: Normal breath sounds. No wheezing or rales.  Abdominal:     General: Bowel sounds are normal. There is no distension.     Palpations: Abdomen is soft.     Tenderness: There is no abdominal tenderness. There is no rebound.  Skin:    General: Skin is warm and dry.  Neurological:     Mental Status: He is alert and oriented to person, place, and time.     Motor: Weakness present.     Coordination: Coordination normal.     Comments: Potential LE weakness bilaterally but effort is poor  Psychiatric:     Comments: Patient does downplay all or most symptoms     Vitals:   03/12/19 1336 03/12/19 1434  BP: (!) 150/100 (!) 140/100  Pulse: 78   Temp: 98.8 F (37.1 C)   TempSrc: Oral   SpO2: 98%   Weight: 200 lb (90.7 kg)   Height: 5\' 9"  (1.753 m)     Assessment & Plan:  Visit time 40 minutes: greater than 50% of that time was spent in face to face counseling and coordination of care with the patient: counseled about CV risk 45%, risk that leg symptoms could be stroke, the nature of essential tremor and that this will not go away or likely improve and may worsen even with medications, blood pressure management

## 2019-03-12 NOTE — Patient Instructions (Signed)
We are going to have you stop amlodipine.   We have sent in clonidine patch to change weekly.   Come back in 3 weeks for blood pressure follow up.

## 2019-03-14 NOTE — Assessment & Plan Note (Signed)
Have recommended that they keep follow up with neurology to discuss medications. Educated about the nature of essential tremor and that this will not cure his tremor and that likely with time this will worsen.

## 2019-03-14 NOTE — Assessment & Plan Note (Signed)
Recent HgA1c at goal on metformin 1000 mg BID. On ARB and statin.

## 2019-03-14 NOTE — Assessment & Plan Note (Signed)
His wife wishes him to stop amlodipine due to potential side effect of swelling. Will keep propranolol, losartan/hctz and add clonidine patch .2 mg daily. Needs visit in 3 weeks to assess as BP is high today and has been high almost every time he is in office. Reminded about need to control BP to help avoid CV disease as well as kidney damage.

## 2019-03-14 NOTE — Assessment & Plan Note (Signed)
Still has been present and his wife adds that she feels he is dragging this leg. Have again recommended evaluation for stroke and ordered MR brain today. Reminded that with his uncontrolled blood pressure and diabetes his risk of CV disease is almost 45%.

## 2019-03-16 ENCOUNTER — Other Ambulatory Visit: Payer: Self-pay

## 2019-03-16 NOTE — Patient Outreach (Signed)
[facility]) Care Management  03/16/2019  JAMARL PEW 04/16/1946 [id number]   Medication Adherence call to Mr. Sajan Cheatwood Telephone call to Patient regarding Medication Adherence unable to reach patient. Mr. Ensminger is showing past due on Losartan/Hctz 100/25 mg under [facility].   [facility] Direct Dial [phone]  Fax [phone] [place] [email or web address]

## 2019-03-19 ENCOUNTER — Telehealth: Payer: Self-pay | Admitting: Internal Medicine

## 2019-03-19 ENCOUNTER — Other Ambulatory Visit: Payer: Self-pay

## 2019-03-19 ENCOUNTER — Ambulatory Visit
Admission: RE | Admit: 2019-03-19 | Discharge: 2019-03-19 | Disposition: A | Discharge status: Home / Self Care | Payer: Medicare Other | Source: Ambulatory Visit | Attending: Internal Medicine | Admitting: Internal Medicine

## 2019-03-19 DIAGNOSIS — R29818 Other symptoms and signs involving the nervous system: Secondary | ICD-10-CM

## 2019-03-19 DIAGNOSIS — I619 Nontraumatic intracerebral hemorrhage, unspecified: Secondary | ICD-10-CM | POA: Diagnosis not present

## 2019-03-19 NOTE — Telephone Encounter (Signed)
LVM informing of MDresponse and to call back and make a 2-3 week follow up

## 2019-03-19 NOTE — Telephone Encounter (Signed)
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 This is likely causing his symptoms with the right leg No midline shift. Talked with radiologist with timing of right leg symptoms July and he thinks potentially this could be culprit lesion. Needs follow up visit in 2-3 weeks as good blood pressure control is very important as well as cholesterol control. We can discuss further action at that visit.

## 2019-03-19 NOTE — Telephone Encounter (Signed)
AccessNurse Call: 03/17/2019 6:00pm  Patient's wife called and left a message for the nurse stating that her husband was seen on September 14th and is having a lot of medical issues. Yesterday his co-workers noticed that he wasn't himself while at work. They said that he was not able to get out of his car and forgot to turn it off. He was also showing a lot of confusion.   When they spoke to the wife she said that he has been showing a lot of confusion, forgetfulness, and increased sleeping. He started dragging his foot over the last month. She stated that last night he could not lift his leg to put pants on. He tried to make a sandwich for work and was forgetting to wrap his sandwich before putting it in the bag. He forgot his teeth, drove to work and forgot how to get out of the car and shut it off.   Nurse Triage advised that he be seen within 4 hours.  No appointment has been scheduled and it does not appear that he was seen anywhere over the weekend.

## 2019-03-19 NOTE — Telephone Encounter (Signed)
I have talked with patient's wife, ms Blumberg and explained MRI results and dr crawfords comments---patient shows signs of several strokes in the past, one in which was prob about 4-6 weeks ago---but nothing acutely happening right now---I went over medications that dr crawford wanted patient to start--patient's wife states she was not able to pick up catapres patches, they were too expensive---I have advised patient to call around to several pharmacies to see if they can use other discounted program/coupons in their website to help make cost of med be more affordable---if patient cannot find this med at affordable price, I have asked patient's wife to call back in the morning and ask to be transferred to [facility] office and talk with tamara, we will need to have dr crawford make further adjustments to medications because patient's bp is not well managed---wife has scheduled follow up appt to recheck bp and cholesterol numbers on 9/30---but patient's wife understands that if patient becomes symptomatic or his bp continues to increase, she will need to call 911 and go to ED, patient's wife repeated  Back for understanding

## 2019-03-19 NOTE — Telephone Encounter (Signed)
This does not sound different from reports at our visit and the dragging leg has been going on since June/July at least. MRI brain ordered at our visit. Advise patient to call to schedule this.

## 2019-03-19 NOTE — Telephone Encounter (Signed)
LVM for patient to call back currently at [facility] for the MRI now

## 2019-03-20 NOTE — Telephone Encounter (Signed)
I have talked with patient's wife today, she was able to pick up catapres patches at [facility] cheaper, she placed first patch today around 1300----she took patient's bp while I was on phone with her just now---his reading is 135/87---patient advised that this is good reading, please continue with patches, check bp about 3x daily and document readings so that we can see how patient's bp trends----can bring those readings to follow up visit with dr [name] and show to her----if bp goes back up while still using meds, call me back, or if needed, go to ED

## 2019-03-21 ENCOUNTER — Telehealth: Payer: Self-pay | Admitting: Internal Medicine

## 2019-03-21 NOTE — Telephone Encounter (Signed)
Copied from [place] [id number]. Topic: General - Call Back - No Documentation >> Mar 21, 2019  1:05 PM Erick Blinks wrote: Reason for CRM: Autumn Messing called requesting to speak with Jonelle Sidle, tried calling office. BP reading 199/119 at 1:00 pm, declined to allow pt to speak to triage. Says everyone in the office is mean and she only wants to speak to tamara

## 2019-03-21 NOTE — Telephone Encounter (Signed)
After talking with patient's wife, we are not real sure if patient actually took losartan/hctz and propranolol yesterday or today---patient doesn't remember well, and wife not at home in mornings to see if he is taking them----we don't think he has taken them today---catapres patch is still on patient--wife to give bp meds at 1330, I will call her back about 1600---to see what bp reading is---reading at 1600 is still up, at 173/113---per dr burns, we need to wait a little longer to know how well meds are controlling since patient has probably missed a couple of days of med dosages----so patient's wife has been advised that if patient becomes symptomatic, headache, chest pain, SOB, more confusion or dizziness or blurred vision, call 911, otherwise continue monitoring bp readings thru out the evening---make sure patient takes bp meds in the AM 9/24---I will call patient's wife in AM on 9/24 to see how bp reading is then

## 2019-03-22 ENCOUNTER — Emergency Department (HOSPITAL_COMMUNITY): Payer: Medicare Other

## 2019-03-22 ENCOUNTER — Encounter (HOSPITAL_COMMUNITY): Payer: Self-pay | Admitting: Emergency Medicine

## 2019-03-22 ENCOUNTER — Inpatient Hospital Stay (HOSPITAL_COMMUNITY)
Admission: EM | Admit: 2019-03-22 | Discharge: 2019-03-24 | DRG: 304 | Disposition: A | Discharge status: Home / Self Care | Payer: Medicare Other | Attending: Family Medicine | Admitting: Family Medicine

## 2019-03-22 ENCOUNTER — Other Ambulatory Visit: Payer: Self-pay

## 2019-03-22 DIAGNOSIS — H18419 Arcus senilis, unspecified eye: Secondary | ICD-10-CM | POA: Diagnosis not present

## 2019-03-22 DIAGNOSIS — R0902 Hypoxemia: Secondary | ICD-10-CM | POA: Diagnosis not present

## 2019-03-22 DIAGNOSIS — E785 Hyperlipidemia, unspecified: Secondary | ICD-10-CM | POA: Diagnosis not present

## 2019-03-22 DIAGNOSIS — G936 Cerebral edema: Secondary | ICD-10-CM | POA: Diagnosis not present

## 2019-03-22 DIAGNOSIS — I169 Hypertensive crisis, unspecified: Secondary | ICD-10-CM | POA: Diagnosis not present

## 2019-03-22 DIAGNOSIS — E118 Type 2 diabetes mellitus with unspecified complications: Secondary | ICD-10-CM | POA: Diagnosis not present

## 2019-03-22 DIAGNOSIS — Z961 Presence of intraocular lens: Secondary | ICD-10-CM | POA: Diagnosis present

## 2019-03-22 DIAGNOSIS — Z20828 Contact with and (suspected) exposure to other viral communicable diseases: Secondary | ICD-10-CM | POA: Diagnosis present

## 2019-03-22 DIAGNOSIS — K649 Unspecified hemorrhoids: Secondary | ICD-10-CM | POA: Diagnosis not present

## 2019-03-22 DIAGNOSIS — Z8551 Personal history of malignant neoplasm of bladder: Secondary | ICD-10-CM

## 2019-03-22 DIAGNOSIS — Z86010 Personal history of colonic polyps: Secondary | ICD-10-CM | POA: Diagnosis not present

## 2019-03-22 DIAGNOSIS — R58 Hemorrhage, not elsewhere classified: Secondary | ICD-10-CM | POA: Diagnosis not present

## 2019-03-22 DIAGNOSIS — Z8249 Family history of ischemic heart disease and other diseases of the circulatory system: Secondary | ICD-10-CM

## 2019-03-22 DIAGNOSIS — I629 Nontraumatic intracranial hemorrhage, unspecified: Secondary | ICD-10-CM | POA: Diagnosis not present

## 2019-03-22 DIAGNOSIS — E119 Type 2 diabetes mellitus without complications: Secondary | ICD-10-CM | POA: Diagnosis present

## 2019-03-22 DIAGNOSIS — Z79899 Other long term (current) drug therapy: Secondary | ICD-10-CM

## 2019-03-22 DIAGNOSIS — R410 Disorientation, unspecified: Secondary | ICD-10-CM

## 2019-03-22 DIAGNOSIS — R29898 Other symptoms and signs involving the musculoskeletal system: Secondary | ICD-10-CM | POA: Diagnosis present

## 2019-03-22 DIAGNOSIS — Z23 Encounter for immunization: Secondary | ICD-10-CM | POA: Diagnosis not present

## 2019-03-22 DIAGNOSIS — I10 Essential (primary) hypertension: Secondary | ICD-10-CM | POA: Diagnosis not present

## 2019-03-22 DIAGNOSIS — R29700 NIHSS score 0: Secondary | ICD-10-CM | POA: Diagnosis present

## 2019-03-22 DIAGNOSIS — Z720 Tobacco use: Secondary | ICD-10-CM

## 2019-03-22 DIAGNOSIS — I160 Hypertensive urgency: Principal | ICD-10-CM | POA: Insufficient documentation

## 2019-03-22 DIAGNOSIS — R531 Weakness: Secondary | ICD-10-CM | POA: Diagnosis not present

## 2019-03-22 DIAGNOSIS — I618 Other nontraumatic intracerebral hemorrhage: Secondary | ICD-10-CM | POA: Diagnosis present

## 2019-03-22 DIAGNOSIS — Z801 Family history of malignant neoplasm of trachea, bronchus and lung: Secondary | ICD-10-CM

## 2019-03-22 DIAGNOSIS — G250 Essential tremor: Secondary | ICD-10-CM | POA: Diagnosis not present

## 2019-03-22 DIAGNOSIS — E876 Hypokalemia: Secondary | ICD-10-CM | POA: Diagnosis not present

## 2019-03-22 DIAGNOSIS — Z888 Allergy status to other drugs, medicaments and biological substances status: Secondary | ICD-10-CM

## 2019-03-22 DIAGNOSIS — R29810 Facial weakness: Secondary | ICD-10-CM | POA: Diagnosis not present

## 2019-03-22 DIAGNOSIS — G4700 Insomnia, unspecified: Secondary | ICD-10-CM | POA: Diagnosis not present

## 2019-03-22 DIAGNOSIS — I6381 Other cerebral infarction due to occlusion or stenosis of small artery: Secondary | ICD-10-CM | POA: Diagnosis not present

## 2019-03-22 DIAGNOSIS — R4182 Altered mental status, unspecified: Secondary | ICD-10-CM

## 2019-03-22 DIAGNOSIS — Z7984 Long term (current) use of oral hypoglycemic drugs: Secondary | ICD-10-CM | POA: Diagnosis not present

## 2019-03-22 DIAGNOSIS — R251 Tremor, unspecified: Secondary | ICD-10-CM | POA: Diagnosis not present

## 2019-03-22 DIAGNOSIS — E1169 Type 2 diabetes mellitus with other specified complication: Secondary | ICD-10-CM | POA: Diagnosis not present

## 2019-03-22 HISTORY — DX: Nontraumatic intracranial hemorrhage, unspecified: I62.9

## 2019-03-22 LAB — CBC
HCT: 47.2 % (ref 39.0–52.0)
Hemoglobin: 16.6 g/dL (ref 13.0–17.0)
MCH: 34.1 pg — ABNORMAL HIGH (ref 26.0–34.0)
MCHC: 35.2 g/dL (ref 30.0–36.0)
MCV: 96.9 fL (ref 80.0–100.0)
Platelets: 255 10*3/uL (ref 150–400)
RBC: 4.87 MIL/uL (ref 4.22–5.81)
RDW: 13.8 % (ref 11.5–15.5)
WBC: 7.4 10*3/uL (ref 4.0–10.5)
nRBC: 0 % (ref 0.0–0.2)

## 2019-03-22 LAB — COMPREHENSIVE METABOLIC PANEL
ALT: 31 U/L (ref 0–44)
AST: 20 U/L (ref 15–41)
Albumin: 3.6 g/dL (ref 3.5–5.0)
Alkaline Phosphatase: 204 U/L — ABNORMAL HIGH (ref 38–126)
Anion gap: 10 (ref 5–15)
BUN: 16 mg/dL (ref 8–23)
CO2: 27 mmol/L (ref 22–32)
Calcium: 9.7 mg/dL (ref 8.9–10.3)
Chloride: 102 mmol/L (ref 98–111)
Creatinine, Ser: 1.12 mg/dL (ref 0.61–1.24)
GFR calc Af Amer: >60 mL/min (ref >60)
GFR calc non Af Amer: >60 mL/min (ref >60)
Glucose, Bld: 132 mg/dL — ABNORMAL HIGH (ref 70–99)
Potassium: 3.6 mmol/L (ref 3.5–5.1)
Sodium: 139 mmol/L (ref 135–145)
Total Bilirubin: 0.4 mg/dL (ref 0.3–1.2)
Total Protein: 7.2 g/dL (ref 6.5–8.1)

## 2019-03-22 LAB — DIFFERENTIAL
Abs Immature Granulocytes: 0.02 10*3/uL (ref 0.00–0.07)
Basophils Absolute: 0.1 10*3/uL (ref 0.0–0.1)
Basophils Relative: 1 %
Eosinophils Absolute: 0.3 10*3/uL (ref 0.0–0.5)
Eosinophils Relative: 4 %
Immature Granulocytes: 0 %
Lymphocytes Relative: 20 %
Lymphs Abs: 1.5 10*3/uL (ref 0.7–4.0)
Monocytes Absolute: 0.7 10*3/uL (ref 0.1–1.0)
Monocytes Relative: 9 %
Neutro Abs: 4.8 10*3/uL (ref 1.7–7.7)
Neutrophils Relative %: 66 %

## 2019-03-22 LAB — PROTIME-INR
INR: 1.1 (ref 0.8–1.2)
Prothrombin Time: 14 seconds (ref 11.4–15.2)

## 2019-03-22 LAB — APTT: aPTT: 23 seconds — ABNORMAL LOW (ref 24–36)

## 2019-03-22 MED ORDER — LOSARTAN POTASSIUM-HCTZ 100-25 MG PO TABS: 1.0000 | ORAL_TABLET | Freq: Every day | ORAL | Status: DC

## 2019-03-22 MED ORDER — SIMVASTATIN 20 MG PO TABS
20.0000 mg | ORAL_TABLET | Freq: Every day | ORAL | Status: DC
  Administered 2019-03-23 – 2019-03-24 (×2): 20 mg via ORAL
  Filled 2019-03-22 (×2): qty 1

## 2019-03-22 MED ORDER — LOSARTAN POTASSIUM 50 MG PO TABS: 100.0000 mg | ORAL_TABLET | Freq: Every day | ORAL | Status: DC

## 2019-03-22 MED ORDER — METFORMIN HCL 500 MG PO TABS: 1000.0000 mg | ORAL_TABLET | Freq: Two times a day (BID) | ORAL | Status: DC

## 2019-03-22 MED ORDER — HYDRALAZINE HCL 10 MG PO TABS
10.0000 mg | ORAL_TABLET | Freq: Four times a day (QID) | ORAL | Status: DC | PRN
  Administered 2019-03-23 – 2019-03-24 (×2): 10 mg via ORAL
  Filled 2019-03-22 (×3): qty 1

## 2019-03-22 MED ORDER — TRANEXAMIC ACID 1000 MG/10ML IV SOLN: 2000.0000 mg | Freq: Once | INTRAVENOUS | Status: DC

## 2019-03-22 MED ORDER — AMLODIPINE BESYLATE 5 MG PO TABS: 10.0000 mg | ORAL_TABLET | Freq: Every day | ORAL | Status: DC

## 2019-03-22 MED ORDER — PRIMIDONE 250 MG PO TABS
750.0000 mg | ORAL_TABLET | Freq: Every day | ORAL | Status: DC
  Administered 2019-03-23 – 2019-03-24 (×2): 750 mg via ORAL
  Filled 2019-03-22 (×2): qty 3

## 2019-03-22 MED ORDER — HYDROCHLOROTHIAZIDE 25 MG PO TABS: 25.0000 mg | ORAL_TABLET | Freq: Every day | ORAL | Status: DC

## 2019-03-22 MED ORDER — NICARDIPINE HCL IN NACL 20-0.86 MG/200ML-% IV SOLN
0.0000 mg/h | INTRAVENOUS | Status: DC
  Administered 2019-03-22: 7.5 mg/h via INTRAVENOUS
  Administered 2019-03-22: 5 mg/h via INTRAVENOUS
  Filled 2019-03-22 (×2): qty 200

## 2019-03-22 MED ORDER — POTASSIUM CHLORIDE CRYS ER 20 MEQ PO TBCR: 20.0000 meq | EXTENDED_RELEASE_TABLET | Freq: Every day | ORAL | Status: DC

## 2019-03-22 MED ORDER — PROPRANOLOL HCL ER 60 MG PO CP24
120.0000 mg | ORAL_CAPSULE | Freq: Every day | ORAL | Status: DC
  Administered 2019-03-23 – 2019-03-24 (×2): 120 mg via ORAL
  Filled 2019-03-22: qty 2
  Filled 2019-03-22 (×3): qty 1
  Filled 2019-03-22: qty 2

## 2019-03-22 NOTE — ED Triage Notes (Signed)
Per EMS patient was using ATM with wife. Wife reported patient suddenly could not figure out how to use the ATM forgetting his pin number and had trouble using the machine. EMS reports A/o x4 and their NIH scale was normal

## 2019-03-22 NOTE — Consult Note (Signed)
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 Pt A&Ox4, NIH 0. PCCM consulted for recommendations.   History of present illness: Information obtained from EMR, patient and ED staff   Drew Harrington is a delightful 73 yo African American male PMH HTN on multiple meds presented to ED via EMS who was summoned by his wife for HTN and episodic confusion today at the ATM. PER EMR the patient's wife stated that he couldn't remember his pin number and stood at the ATM for a period of time. He finally remembered and then was unable to put his money in his wallet. His wife had to help him with this task.   The patient was seen by his PCP in July with a concern for dragging of his RLE while walking. He was apparently not concerned by it then and denied it being a problem but his wife mentioned it to his PCP. He was re-evaluated on 03/12/2019 by his PCP where he downplayed his symptoms but was found to have bilateral LE weakness (his wife insisted he continued to drag his RLE while walking) for which an MRI was ordered outpatient. He underwent same on 9/21 which revealed 1.4cm left thalamic hemorrhage, subacute in appearance, mild edema without significant mass effect was noted. Also noted were chronic left basal ganglia, left thalamic, and left occipital infarcts. The patient and his wife were notified of this on 9/21 with plan to maintain good BP control and f/u visit with PCP in 2-3 weeks. He was placed on catapres patch in addition to other BP meds. Close telephone f/u with PCP to ensure meds were being taken and BP improvement.   The patient states that his BP was  high today so his wife called 911. He states he usually feels good if his BP is less than 180-190.   In the ED BP was as high as 208/102. NIH 0. No neurological deficits or complaints. Head CT showed grossly unchanged 1.2 cm subacute hemorrhage in the left thalamus with mild surrounding edema. No new or progressive intracranial Hemorrhage. Cardene infusion initiated. PCCM consulted for recommendations/possible admission.   Past Medical History  HTN DM Bladder Ca Essential tremor  HLD    Significant Hospital Events   NA  Consults:  PCCM   Procedures:  NA  Significant Diagnostic Tests:  9/24 CT Head  IMPRESSION: 1. Grossly unchanged 1.2 cm subacute hemorrhage in the left thalamus with mild surrounding edema. No new or progressive intracranial hemorrhage. 2. 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   9/21 MRI Brain  IMPRESSION: 1. 1.4 cm left thalamic hemorrhage, subacute in appearance. Mild edema without significant mass effect. 2. Chronic left basal ganglia, left thalamic, and left occipital infarcts. Micro Data:  SARS coronavirus 2 negative   Antimicrobials:  NA   Interim history/subjective:  No complaints.   Objective   Blood pressure (!) 145/101, pulse 71, temperature 98.2 F (36.8 C), temperature source Oral, resp. rate 13, height 5' 9" (1.753 m), weight 90.7 kg, SpO2 97 %.       No intake or output data in the 24 hours ending 03/22/19 2130 [employer]  03/22/19 1657  Weight: 90.7 kg    Examination: General: Well-developed, well nourished. NAD HENT: Normocephalic, PERRL. Moist mucus membranes. Mild arcus senilis  Neck: No JVD. Trachea midline. No thyromegaly, no lymphadenopathy CV: RRR. S1S2. No MRG. +2 distal pulses Lungs: BBS present, clear, FNL, symmetrical ABD: +BS x4. SNT/ND. No masses, guarding or rigidity GU: No Foley EXT: MAE well. No edema Skin: PWD. In tact. No rashes or lesions Neuro: A&Ox3. CN II-XII in tact. No focal  deficits Psych: Appropriate mood, insight and judgment for time and situation    Resolved Hospital Problem list   NA   Assessment & Plan:  Subacute thalamic bleed with HTN. Pt states his normal BP is around 180-190 and feels good with this. Given subacute findings and neurologically in tact no need for aggressive BP reduction.   Recommendations D/c cardene gtt Pt may go to medicine level care Liberalize BP goal for SBP <180 Continue home meds Prn anti-HTN.   PCCM will sign off. Thank you for the opportunity to participate in this patient's care. Please contact if we can be of further assistance.   Labs   CBC: Recent Labs  Lab 03/22/19 1722  WBC 7.4  NEUTROABS 4.8  HGB 16.6  HCT 47.2  MCV 96.9  PLT 782    Basic Metabolic Panel: Recent Labs  Lab 03/22/19 1722  NA 139  K 3.6  CL 102  CO2 27  GLUCOSE 132*  BUN 16  CREATININE 1.12  CALCIUM 9.7   GFR: Estimated Creatinine Clearance: 64.4 mL/min (by C-G formula based on SCr of 1.12 mg/dL). Recent Labs  Lab 03/22/19 1722  WBC 7.4    Liver Function Tests: Recent Labs  Lab 03/22/19 1722  AST 20  ALT 31  ALKPHOS 204*  BILITOT 0.4  PROT 7.2  ALBUMIN 3.6   No results for input(s): LIPASE, AMYLASE in the last 168 hours. No results for input(s): AMMONIA in the last 168 hours.  ABG No results found for: PHART, PCO2ART, PO2ART, HCO3, TCO2, ACIDBASEDEF, O2SAT   Coagulation Profile: Recent Labs  Lab 03/22/19 1722  INR 1.1    Cardiac Enzymes: No results for input(s): CKTOTAL, CKMB, CKMBINDEX, TROPONINI in the last 168 hours.  HbA1C: Hgb A1c MFr Bld  Date/Time Value Ref Range Status  01/10/2019 09:29 AM 6.4 4.6 - 6.5 % Final    Comment:    Glycemic Control Guidelines for People with Diabetes:Non Diabetic:  <6%Goal of Therapy: <7%Additional Action Suggested:  >8%   07/12/2018 09:38 AM 6.4 4.6 - 6.5 % Final    Comment:    Glycemic Control Guidelines for People with Diabetes:Non Diabetic:  <6%Goal of  Therapy: <7%Additional Action Suggested:  >8%     CBG: No results for input(s): GLUCAP in the last 168 hours.  Review of Systems:   Review of Systems  Constitutional: Negative.   HENT: Negative.   Eyes: Negative.   Respiratory: Negative.   Cardiovascular: Negative.   Gastrointestinal: Negative.   Genitourinary: Negative.   Musculoskeletal: Negative.   Skin: Negative.   Neurological: Positive for tremors and weakness.       Forgetfulness   Endo/Heme/Allergies: Negative.      Past Medical History  He,  has a past medical history of Allergic rhinitis, Benign neoplasm of colon, Bladder cancer ([place]), Borderline diabetes, HTN (hypertension), Insomnia, unspecified, Perirectal abscess (05/10/2013), Personal history of colonic adenomas (04/10/2010), Psychosexual dysfunction with inhibited sexual excitement, and Unspecified hemorrhoids without mention of complication.   Surgical History  Past Surgical History:  Procedure Laterality Date  . CATARACT EXTRACTION W/ INTRAOCULAR LENS  IMPLANT, BILATERAL    . COLONOSCOPY    . removal cyst hip Left 05/23/2013  . TRANSTHORACIC ECHOCARDIOGRAM  04-06-2006   LVSF NORMAL/  EF 60%/ AORTIC ROOT AT UPPER LIMITS OF NORMAL SIZE  . TRANSURETHRAL RESECTION OF BLADDER TUMOR  10/08/2011   Procedure: CIRCUMCISION AND TRANSURETHRAL RESECTION OF BLADDER TUMOR (TURBT);  Surgeon: [name], MD;  Location: [facility];  Service: Urology;  Laterality: N/A;  . TRANSURETHRAL RESECTION OF BLADDER TUMOR  11/30/2011   Procedure: TRANSURETHRAL RESECTION OF BLADDER TUMOR (TURBT);  Surgeon: [name], MD;  Location: [facility];  Service: Urology;  Laterality: N/A;     Social History   reports that he quit smoking about 25 years ago. His smokeless tobacco use includes chew. He reports that he does not drink alcohol or use drugs.   Family History   His family history includes Cancer in his father, sister,  sister, and sister; Gout in an other family member; Heart disease in an other family member; Hypertension in an other family member; Lung disease in his father. There is no history of Colon cancer.   Allergies Allergies  Allergen Reactions  . Sildenafil Other (See Comments)    Caused a headache     Home Medications  Prior to Admission medications   Medication Sig Start Date End Date Taking? Authorizing Provider  cloNIDine (CATAPRES - DOSED IN MG/24 HR) 0.2 mg/24hr patch Place 1 patch (0.2 mg total) onto the skin once a week. Patient taking differently: Place 0.2 mg onto the skin every Tuesday.  03/12/19  Yes Hoyt Koch, MD  ibuprofen (ADVIL) 200 MG tablet Take 200 mg by mouth every 6 (six) hours as needed for headache or mild pain.   Yes [provider]  losartan-hydrochlorothiazide (HYZAAR) 100-25 MG tablet TAKE 1 TABLET BY MOUTH DAILY 02/28/19  Yes Hoyt Koch, MD  metFORMIN (GLUCOPHAGE) 1000 MG tablet TAKE 1 TABLET(1000 MG) BY MOUTH TWICE DAILY WITH A MEAL Patient taking differently: Take 1,000 mg by mouth 2 (two) times daily with a meal.  12/04/18  Yes Hoyt Koch, MD  Multiple Vitamins-Minerals (CENTRUM SILVER PO) Take 1 tablet by mouth 2 (two) times a week.    Yes [provider]  naproxen sodium (ALEVE) 220 MG tablet Take 220-440 mg by mouth 2 (two) times daily as needed (for pain or headaches).   Yes [provider]  potassium chloride SA (K-DUR) 20 MEQ tablet TAKE 1 TABLET(20 MEQ) BY MOUTH DAILY Patient taking differently: Take 20 mEq by mouth daily.  02/28/19  Yes Hoyt Koch, MD  primidone (MYSOLINE) 250 MG tablet Take 750 mg by mouth See admin instructions. Take 750 mg by mouth in the morning 11/07/18  Yes [provider]  propranolol ER (INDERAL LA) 120 MG 24 hr capsule TAKE 1 CAPSULE(120 MG) BY MOUTH DAILY Patient taking differently: Take 120 mg by mouth daily.  01/22/19  Yes Hoyt Koch, MD   simvastatin (ZOCOR) 20 MG tablet TAKE 1 TABLET(20 MG) BY MOUTH AT BEDTIME Patient taking differently: Take 20 mg by mouth daily.  02/28/19  Yes Hoyt Koch, MD  amLODipine (NORVASC) 10 MG tablet Take 10 mg by mouth daily.    [provider]     Francine Graven, MSN, [place]  Pager [phone] or if no answer [phone] [facility]

## 2019-03-22 NOTE — ED Provider Notes (Signed)
[facility] EMERGENCY DEPARTMENT Provider Note   CSN: [id number] Arrival date & time: 03/22/19  1651     History   Chief Complaint Chief Complaint  Patient presents with  . Altered Mental Status    HPI Drew Harrington is a 73 y.o. male.     HPI  The patient presents with worsening confusion and leg weakness. Per his wife and chart review, the patient was seen by his PCP in July with a concern for dragging of his RLE while walking. He was apparently not concerned by it then and denied it being a problem but his wife mentioned it to his PCP. He was re-evaluated on 03/12/2019 by his PCP where he downplayed his symptoms but was found to have bilateral LE weakness (his wife insisted he continued to drag his RLE while walking) for which an MRI was ordered outpatient. He has a hx of HTN and his wife states that his BP at home is normally between 707-867 systolic. His amlodipine was stopped be he continued to take Propranolol, Losartan/HCTZ, and a clonidine patch 0.'2mg'$  daily.   Since that visit, his wife states that he has had intermittent confusion, forgetfulness, and increased somnolence. Per an access nurse phone consult on 9/19, the patient had an episode of confusion while at work the day before. He continued to drag his right leg, and had trouble lifting it to get his pants on. His wife states that she had trouble convincing him to go to get an MRI over the weekend and finally went on Monday, 9/21 for an MRI. The MRI report was concerning for a 1.4cm left thalamic hemorrhage, subacute in appearance. Mild edema without significant mass effect was noted. Also noted were chronic left basal ganglia, left thalamic, and left occipital infarcts. Concern by PCP for lesion/infarct in July contributing to his symptoms.   The patient's wife called EMS due to an episode of confusion today at the ATM. She stated that he couldn't remember his pin number and stood at the ATM for a period  of time. He finally remembered and then was unable to put his money in his wallet. His wife had to help him with this task. Per triage nursing note, it is not clear if the patient has been remembering to take his BP medications.   On arrival, the patient was alert and oriented x3, with no current complaints. He denies a history of EtOH use. No recent changes to his medications. No recent syncope at home. No headaches. No fevers/chills or sick contacts.  Past Medical History:  Diagnosis Date  . Allergic rhinitis   . Benign neoplasm of colon   . Bladder cancer ([place])   . Borderline diabetes   . HTN (hypertension)   . Insomnia, unspecified   . Perirectal abscess 05/10/2013  . Personal history of colonic adenomas 04/10/2010  . Psychosexual dysfunction with inhibited sexual excitement   . Unspecified hemorrhoids without mention of complication     Patient Active Problem List   Diagnosis Date Noted  . Hypertensive crisis 03/23/2019  . Hypertensive urgency   . Right leg weakness 01/10/2019  . Right ankle swelling 07/12/2018  . Tremor, essential 02/27/2014  . Routine health maintenance 01/11/2013  . History of colonic polyps 04/10/2010  . 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 Hyperlipidemia associated with type 2 diabetes mellitus ([name]) 11/17/2007  . Diabetes mellitus type 2 with complications ([place]) 54/49/2010  . IMPOTENCE 11/16/2007  . Essential hypertension, malignant 11/16/2007  . HEMORRHOIDS  11/16/2007    Past Surgical History:  Procedure Laterality Date  . CATARACT EXTRACTION W/ INTRAOCULAR LENS  IMPLANT, BILATERAL    . COLONOSCOPY    . removal cyst hip Left 05/23/2013  . TRANSTHORACIC ECHOCARDIOGRAM  04-06-2006   LVSF NORMAL/  EF 60%/ AORTIC ROOT AT UPPER LIMITS OF NORMAL SIZE  . TRANSURETHRAL RESECTION OF BLADDER TUMOR  10/08/2011   Procedure: CIRCUMCISION AND TRANSURETHRAL RESECTION OF BLADDER TUMOR (TURBT);  Surgeon: [name], MD;  Location: [facility];  Service: Urology;  Laterality: N/A;  . TRANSURETHRAL RESECTION OF BLADDER TUMOR  11/30/2011   Procedure: TRANSURETHRAL RESECTION OF BLADDER TUMOR (TURBT);  Surgeon: [name], MD;  Location: [facility];  Service: Urology;  Laterality: N/A;        Home Medications    Prior to Admission medications   Medication Sig Start Date End Date Taking? Authorizing Provider  cloNIDine (CATAPRES - DOSED IN MG/24 HR) 0.2 mg/24hr patch Place 1 patch (0.2 mg total) onto the skin once a week. Patient taking differently: Place 0.2 mg onto the skin every Tuesday.  03/12/19  Yes Hoyt Koch, MD  ibuprofen (ADVIL) 200 MG tablet Take 200 mg by mouth every 6 (six) hours as needed for headache or mild pain.   Yes [provider]  losartan-hydrochlorothiazide (HYZAAR) 100-25 MG tablet TAKE 1 TABLET BY MOUTH DAILY 02/28/19  Yes Hoyt Koch, MD  metFORMIN (GLUCOPHAGE) 1000 MG tablet TAKE 1 TABLET(1000 MG) BY MOUTH TWICE DAILY WITH A MEAL Patient taking differently: Take 1,000 mg by mouth 2 (two) times daily with a meal.  12/04/18  Yes Hoyt Koch, MD  Multiple Vitamins-Minerals (CENTRUM SILVER PO) Take 1 tablet by mouth 2 (two) times a week.    Yes [provider]  naproxen sodium (ALEVE) 220 MG tablet Take 220-440 mg by mouth 2 (two) times daily as needed (for pain or headaches).   Yes [provider]  potassium chloride SA (K-DUR) 20 MEQ tablet TAKE 1 TABLET(20 MEQ) BY MOUTH DAILY Patient taking differently: Take 20 mEq by mouth daily.  02/28/19  Yes Hoyt Koch, MD  primidone (MYSOLINE) 250 MG tablet Take 750 mg by mouth See admin instructions. Take 750 mg by mouth in the morning 11/07/18  Yes [provider]  propranolol ER (INDERAL LA) 120 MG 24 hr capsule TAKE 1 CAPSULE(120 MG) BY MOUTH DAILY Patient taking differently: Take 120 mg by mouth daily.  01/22/19  Yes Hoyt Koch, MD  simvastatin  (ZOCOR) 20 MG tablet TAKE 1 TABLET(20 MG) BY MOUTH AT BEDTIME Patient taking differently: Take 20 mg by mouth daily.  02/28/19  Yes Hoyt Koch, MD  amLODipine (NORVASC) 10 MG tablet Take 10 mg by mouth daily.    [provider]    Family History Family History  Problem Relation Age of Onset  . Lung disease Father   . Cancer Father        lung  . Cancer Sister        lung  . Cancer Sister        lung  . Cancer Sister        lung  . Gout Other   . Heart disease Other   . Hypertension Other   . Colon cancer Neg Hx     Social History Social History   Tobacco Use  . Smoking status: Former Smoker    Quit date: 05/10/1993    Years since  quitting: 25.8  . Smokeless tobacco: Current User    Types: Chew  Substance Use Topics  . Alcohol use: No  . Drug use: No     Allergies   Sildenafil   Review of Systems Review of Systems  Constitutional: Negative for chills and fever.  Respiratory: Negative for cough and choking.   Cardiovascular: Negative for chest pain.  Gastrointestinal: Negative for nausea and vomiting.  Musculoskeletal: Negative for neck pain.  Skin: Negative for rash.  Neurological: Positive for weakness. Negative for syncope, facial asymmetry, speech difficulty, numbness and headaches.  All other systems reviewed and are negative.    Physical Exam Updated Vital Signs ED Triage Vitals  Enc Vitals Group     BP 03/22/19 1700 (!) 195/150     Pulse Rate 03/22/19 1700 73     Resp 03/22/19 1700 16     Temp 03/22/19 1700 98.2 F (36.8 C)     Temp Source 03/22/19 1700 Oral     SpO2 03/22/19 1656 98 %     Weight 03/22/19 1657 200 lb (90.7 kg)     Height 03/22/19 1657 '5\' 9"'$  (1.753 m)     Head Circumference --      Peak Flow --      Pain Score 03/22/19 1657 0     Pain Loc --      Pain Edu? --      Excl. in [place]? --      Physical Exam Vitals signs and nursing note reviewed.  Constitutional:      Appearance: He is well-developed.   HENT:     Head: Normocephalic and atraumatic.  Eyes:     Extraocular Movements: Extraocular movements intact.     Conjunctiva/sclera: Conjunctivae normal.     Pupils: Pupils are equal, round, and reactive to light.  Neck:     Musculoskeletal: Neck supple.  Cardiovascular:     Rate and Rhythm: Normal rate and regular rhythm.     Heart sounds: No murmur.  Pulmonary:     Effort: Pulmonary effort is normal. No respiratory distress.     Breath sounds: Normal breath sounds.  Abdominal:     Palpations: Abdomen is soft.     Tenderness: There is no abdominal tenderness.  Musculoskeletal:        General: No swelling or tenderness.  Skin:    General: Skin is warm and dry.  Neurological:     Mental Status: He is alert.     Cranial Nerves: No cranial nerve deficit.     Sensory: No sensory deficit.     Motor: Weakness present.     Coordination: Coordination normal.     Comments: Bilateral 4/5 lower extremity weakness. Gait assessment deferred.      ED Treatments / Results  Labs (all labs ordered are listed, but only abnormal results are displayed) Labs Reviewed  APTT - Abnormal; Notable for the following components:      Result Value   aPTT 23 (*)    All other components within normal limits  CBC - Abnormal; Notable for the following components:   MCH 34.1 (*)    All other components within normal limits  COMPREHENSIVE METABOLIC PANEL - Abnormal; Notable for the following components:   Glucose, Bld 132 (*)    Alkaline Phosphatase 204 (*)    All other components within normal limits  URINALYSIS, ROUTINE W REFLEX MICROSCOPIC - Abnormal; Notable for the following components:   Color, Urine STRAW (*)    All  other components within normal limits  SARS CORONAVIRUS 2 (TAT 6-24 HRS)  PROTIME-INR  DIFFERENTIAL  BASIC METABOLIC PANEL  CBC  HEMOGLOBIN A1C    EKG EKG Interpretation  Date/Time:  Thursday March 22 2019 16:57:21 EDT Ventricular Rate:  72 PR Interval:    QRS  Duration: 102 QT Interval:  395 QTC Calculation: 433 R Axis:   -47 Text Interpretation:  Sinus rhythm LAE, consider biatrial enlargement Left anterior fascicular block Abnormal R-wave progression, late transition LVH with secondary repolarization abnormality No significant change since last tracing Confirmed by Merrily Pew [phone]) on 03/22/2019 5:14:53 PM   Radiology Ct Head Wo Contrast  Result Date: 03/22/2019 CLINICAL DATA:  Transient altered mental status. EXAM: CT HEAD WITHOUT CONTRAST TECHNIQUE: Contiguous axial images were obtained from the base of the skull through the vertex without intravenous contrast. COMPARISON:  MR brain dated March 19, 2019. FINDINGS: Brain: Grossly unchanged 1.2 x 1.0 x 1.1 cm subacute hemorrhage in the left thalamus with mild surrounding edema extending into the posterior limb of the internal capsule. Old lacunar infarcts involving the left basal ganglia. Small chronic infarct in the left occipital lobe. Stable mild chronic microvascular ischemic changes. No evidence of acute infarction, hydrocephalus, extra-axial collection or mass lesion/mass effect. Vascular: No hyperdense vessel or unexpected calcification. Skull: Normal. Negative for fracture or focal lesion. Sinuses/Orbits: No acute finding. Mild paranasal sinus mucosal thickening. Other: None. IMPRESSION: 1. Grossly unchanged 1.2 cm subacute hemorrhage in the left thalamus with mild surrounding edema. No new or progressive intracranial hemorrhage. 2. 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 Electronically Signed   By: Titus Dubin M.D.   On: 03/22/2019 18:53    Procedures Procedures (including critical care time)  Medications Ordered in ED Medications  amLODipine (NORVASC) tablet 10 mg (has no administration in time range)  propranolol ER (INDERAL LA) 24 hr capsule 120 mg (has no administration in time range)  simvastatin (ZOCOR) tablet 20 mg (has no administration in time range)   potassium chloride SA (K-DUR) CR tablet 20 mEq (has no administration in time range)  primidone (MYSOLINE) tablet 750 mg (has no administration in time range)  hydrALAZINE (APRESOLINE) tablet 10 mg (has no administration in time range)  losartan (COZAAR) tablet 100 mg (has no administration in time range)    And  hydrochlorothiazide (HYDRODIURIL) tablet 25 mg (has no administration in time range)  insulin aspart (novoLOG) injection 0-9 Units (has no administration in time range)     Initial Impression / Assessment and Plan / ED Course  I have reviewed the triage vital signs and the nursing notes.  Pertinent labs & imaging results that were available during my care of the patient were reviewed by me and considered in my medical decision making (see chart for details).        73 year old male presenting with intermittent episodes of confusion at home over the past few weeks. Worsening lower extremity weakness. Treated for an essential tremor with Propranolol and Primidone. On arrival, GCS 15, mildly confused but AAOx3. He has difficulty recalling facts and events noted by his wife. Neurologic exam otherwise appears to be at baseline. MRI on 9/21 concerning for an subacute thalamic hemorrhage and old infarcts. No new neurologic deficits. The patient denies EtOH use or recent medication changes. He is not hypoxic or dyspneic, is afebrile, with clear lungs on auscultation. He denies urinary symptoms.  Concern at this time for intermittent hypertensive encephalopathy given the patient's presenting blood pressure vs new  hemorrhage. The patient was initially placed on a Cardene gtt which was subsequently weaned. His confusion improved following BP reduction.   CT Head was obtained, notable for grossly unchanged 1.2cm subacute hemorrhage in the left thalamus with mild surrounding edema. No new hemorrhage. 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   We spoke with neurology over the  phone, Dr. Cheral Marker, who did not feel the patient's imaging reflected any acute ischemic or hemorrhagic stroke. Recommended BP control and toxic/metabolic workup. Critical care was consulted and recommended medicine admission. Cardene gtt weaned off. His home BP meds were reordered. Medicine consulted for admission for further evaluation. The patient was subsequently admitted in stable condition.  Final Clinical Impressions(s) / ED Diagnoses   Final diagnoses:  Confusion  Hypertension, unspecified type  Altered mental status, unspecified altered mental status type    ED Discharge Orders    None       Regan Lemming, MD 03/23/19 0236    Merrily Pew, MD 03/24/19 [phone]

## 2019-03-22 NOTE — Telephone Encounter (Signed)
lvm asking patient's wife to call me back with bp readings today, and to make sure patient DID take all bp meds----patient has called back---slight response with bp medicines today, but still readings no better than 160/100--patient's wife noticed a huge difference in way patient was acting, so she has called 911---go to hospital

## 2019-03-23 ENCOUNTER — Encounter (HOSPITAL_COMMUNITY): Payer: Self-pay | Admitting: Internal Medicine

## 2019-03-23 DIAGNOSIS — G936 Cerebral edema: Secondary | ICD-10-CM | POA: Diagnosis present

## 2019-03-23 DIAGNOSIS — E785 Hyperlipidemia, unspecified: Secondary | ICD-10-CM

## 2019-03-23 DIAGNOSIS — I169 Hypertensive crisis, unspecified: Secondary | ICD-10-CM | POA: Diagnosis not present

## 2019-03-23 DIAGNOSIS — E1169 Type 2 diabetes mellitus with other specified complication: Secondary | ICD-10-CM | POA: Diagnosis not present

## 2019-03-23 DIAGNOSIS — Z7984 Long term (current) use of oral hypoglycemic drugs: Secondary | ICD-10-CM | POA: Diagnosis not present

## 2019-03-23 DIAGNOSIS — I618 Other nontraumatic intracerebral hemorrhage: Secondary | ICD-10-CM | POA: Diagnosis present

## 2019-03-23 DIAGNOSIS — R531 Weakness: Secondary | ICD-10-CM | POA: Diagnosis present

## 2019-03-23 DIAGNOSIS — Z8551 Personal history of malignant neoplasm of bladder: Secondary | ICD-10-CM | POA: Diagnosis not present

## 2019-03-23 DIAGNOSIS — Z79899 Other long term (current) drug therapy: Secondary | ICD-10-CM | POA: Diagnosis not present

## 2019-03-23 DIAGNOSIS — I160 Hypertensive urgency: Secondary | ICD-10-CM | POA: Diagnosis not present

## 2019-03-23 DIAGNOSIS — R251 Tremor, unspecified: Secondary | ICD-10-CM

## 2019-03-23 DIAGNOSIS — E118 Type 2 diabetes mellitus with unspecified complications: Secondary | ICD-10-CM | POA: Diagnosis present

## 2019-03-23 DIAGNOSIS — Z888 Allergy status to other drugs, medicaments and biological substances status: Secondary | ICD-10-CM | POA: Diagnosis not present

## 2019-03-23 DIAGNOSIS — I10 Essential (primary) hypertension: Secondary | ICD-10-CM | POA: Diagnosis present

## 2019-03-23 DIAGNOSIS — E876 Hypokalemia: Secondary | ICD-10-CM | POA: Diagnosis not present

## 2019-03-23 DIAGNOSIS — K649 Unspecified hemorrhoids: Secondary | ICD-10-CM | POA: Diagnosis present

## 2019-03-23 DIAGNOSIS — G250 Essential tremor: Secondary | ICD-10-CM | POA: Diagnosis present

## 2019-03-23 DIAGNOSIS — Z23 Encounter for immunization: Secondary | ICD-10-CM | POA: Diagnosis not present

## 2019-03-23 DIAGNOSIS — H18419 Arcus senilis, unspecified eye: Secondary | ICD-10-CM | POA: Diagnosis present

## 2019-03-23 DIAGNOSIS — I6381 Other cerebral infarction due to occlusion or stenosis of small artery: Secondary | ICD-10-CM | POA: Diagnosis present

## 2019-03-23 DIAGNOSIS — Z801 Family history of malignant neoplasm of trachea, bronchus and lung: Secondary | ICD-10-CM | POA: Diagnosis not present

## 2019-03-23 DIAGNOSIS — I629 Nontraumatic intracranial hemorrhage, unspecified: Secondary | ICD-10-CM | POA: Diagnosis not present

## 2019-03-23 DIAGNOSIS — Z720 Tobacco use: Secondary | ICD-10-CM | POA: Diagnosis not present

## 2019-03-23 DIAGNOSIS — G4700 Insomnia, unspecified: Secondary | ICD-10-CM | POA: Diagnosis present

## 2019-03-23 DIAGNOSIS — Z20828 Contact with and (suspected) exposure to other viral communicable diseases: Secondary | ICD-10-CM | POA: Diagnosis present

## 2019-03-23 DIAGNOSIS — Z8249 Family history of ischemic heart disease and other diseases of the circulatory system: Secondary | ICD-10-CM | POA: Diagnosis not present

## 2019-03-23 DIAGNOSIS — R4182 Altered mental status, unspecified: Secondary | ICD-10-CM | POA: Diagnosis present

## 2019-03-23 DIAGNOSIS — R29700 NIHSS score 0: Secondary | ICD-10-CM | POA: Diagnosis present

## 2019-03-23 DIAGNOSIS — Z86010 Personal history of colonic polyps: Secondary | ICD-10-CM | POA: Diagnosis not present

## 2019-03-23 LAB — BASIC METABOLIC PANEL
Anion gap: 12 (ref 5–15)
BUN: 13 mg/dL (ref 8–23)
CO2: 27 mmol/L (ref 22–32)
Calcium: 9.4 mg/dL (ref 8.9–10.3)
Chloride: 100 mmol/L (ref 98–111)
Creatinine, Ser: 1.01 mg/dL (ref 0.61–1.24)
GFR calc Af Amer: >60 mL/min (ref >60)
GFR calc non Af Amer: >60 mL/min (ref >60)
Glucose, Bld: 113 mg/dL — ABNORMAL HIGH (ref 70–99)
Potassium: 3.1 mmol/L — ABNORMAL LOW (ref 3.5–5.1)
Sodium: 139 mmol/L (ref 135–145)

## 2019-03-23 LAB — CBC
HCT: 47.5 % (ref 39.0–52.0)
Hemoglobin: 16.1 g/dL (ref 13.0–17.0)
MCH: 33.5 pg (ref 26.0–34.0)
MCHC: 33.9 g/dL (ref 30.0–36.0)
MCV: 98.8 fL (ref 80.0–100.0)
Platelets: 260 10*3/uL (ref 150–400)
RBC: 4.81 MIL/uL (ref 4.22–5.81)
RDW: 13.8 % (ref 11.5–15.5)
WBC: 6.8 10*3/uL (ref 4.0–10.5)
nRBC: 0 % (ref 0.0–0.2)

## 2019-03-23 LAB — URINALYSIS, ROUTINE W REFLEX MICROSCOPIC
Bilirubin Urine: NEGATIVE
Glucose, UA: NEGATIVE mg/dL
Hgb urine dipstick: NEGATIVE
Ketones, ur: NEGATIVE mg/dL
Leukocytes,Ua: NEGATIVE
Nitrite: NEGATIVE
Protein, ur: NEGATIVE mg/dL
Specific Gravity, Urine: 1.01 (ref 1.005–1.030)
pH: 6 (ref 5.0–8.0)

## 2019-03-23 LAB — CBG MONITORING, ED: Glucose-Capillary: 117 mg/dL — ABNORMAL HIGH (ref 70–99)

## 2019-03-23 LAB — SARS CORONAVIRUS 2 (TAT 6-24 HRS): SARS Coronavirus 2: NEGATIVE

## 2019-03-23 LAB — GLUCOSE, CAPILLARY: Glucose-Capillary: 135 mg/dL — ABNORMAL HIGH (ref 70–99)

## 2019-03-23 LAB — HEMOGLOBIN A1C
Hgb A1c MFr Bld: 6.3 % — ABNORMAL HIGH (ref 4.8–5.6)
Mean Plasma Glucose: 134.11 mg/dL

## 2019-03-23 MED ORDER — INSULIN ASPART 100 UNIT/ML ~~LOC~~ SOLN
0.0000 [IU] | Freq: Three times a day (TID) | SUBCUTANEOUS | Status: DC
  Administered 2019-03-23: 1 [IU] via SUBCUTANEOUS

## 2019-03-23 MED ORDER — LOSARTAN POTASSIUM 50 MG PO TABS
100.0000 mg | ORAL_TABLET | Freq: Every day | ORAL | Status: DC
  Administered 2019-03-23 – 2019-03-24 (×2): 100 mg via ORAL
  Filled 2019-03-23 (×2): qty 2

## 2019-03-23 MED ORDER — POTASSIUM CHLORIDE CRYS ER 20 MEQ PO TBCR
40.0000 meq | EXTENDED_RELEASE_TABLET | Freq: Once | ORAL | Status: AC
  Administered 2019-03-23: 40 meq via ORAL
  Filled 2019-03-23: qty 2

## 2019-03-23 MED ORDER — AMLODIPINE BESYLATE 5 MG PO TABS: 10.0000 mg | ORAL_TABLET | Freq: Every day | ORAL | Status: DC

## 2019-03-23 MED ORDER — AMLODIPINE BESYLATE 10 MG PO TABS
10.0000 mg | ORAL_TABLET | Freq: Every day | ORAL | Status: DC
  Administered 2019-03-23 – 2019-03-24 (×2): 10 mg via ORAL
  Filled 2019-03-23 (×2): qty 1

## 2019-03-23 MED ORDER — INFLUENZA VAC A&B SA ADJ QUAD 0.5 ML IM PRSY
0.5000 mL | PREFILLED_SYRINGE | INTRAMUSCULAR | Status: AC
  Administered 2019-03-24: 09:00:00 0.5 mL via INTRAMUSCULAR
  Filled 2019-03-23: qty 0.5

## 2019-03-23 MED ORDER — POTASSIUM CHLORIDE CRYS ER 20 MEQ PO TBCR
20.0000 meq | EXTENDED_RELEASE_TABLET | Freq: Every day | ORAL | Status: DC
  Administered 2019-03-24: 20 meq via ORAL
  Filled 2019-03-23: qty 1

## 2019-03-23 MED ORDER — HYDROCHLOROTHIAZIDE 25 MG PO TABS
25.0000 mg | ORAL_TABLET | Freq: Every day | ORAL | Status: DC
  Administered 2019-03-23 – 2019-03-24 (×2): 25 mg via ORAL
  Filled 2019-03-23 (×2): qty 1

## 2019-03-23 NOTE — TOC Initial Note (Signed)
Transition of Care [facility]) - Initial/Assessment Note    Patient Details  Name: Drew Harrington MRN: [id number] Date of Birth: March 18, 1946  [facility]) CM/SW Contact:    Pollie Friar, RN Phone Number: 03/23/2019, 4:31 PM  Clinical Narrative:                 CM consulted for outpatient therapy. Pt would like to attend [facility]. Orders in epic and information on the AVS. Pt denies issues with transportation and home meds. TOC following for further d/c needs.   Expected Discharge Plan: OP Rehab Barriers to Discharge: Continued Medical Work up   Patient Goals and CMS Choice     Choice offered to / list presented to : Patient  Expected Discharge Plan and Services Expected Discharge Plan: OP Rehab   Discharge Planning Services: CM Consult   Living arrangements for the past 2 months: Single Family Home                                      Prior Living Arrangements/Services Living arrangements for the past 2 months: Single Family Home Lives with:: Spouse Patient language and need for interpreter reviewed:: Yes(no needs) Do you feel safe going back to the place where you live?: Yes      Need for Family Participation in Patient Care: Yes (Comment) Care giver support system in place?: Yes (comment)(wife is able to provide 24 hour supervision)   Criminal Activity/Legal Involvement Pertinent to Current Situation/Hospitalization: No - Comment as needed  Activities of Daily Living      Permission Sought/Granted                  Emotional Assessment Appearance:: Appears stated age Attitude/Demeanor/Rapport: Engaged Affect (typically observed): Accepting, Pleasant Orientation: : Oriented to Self, Oriented to Place, Oriented to  Time, Oriented to Situation   Psych Involvement: No (comment)  Admission diagnosis:  Confusion [R41.0] Altered mental status, unspecified altered mental status type [R41.82] Hypertension, unspecified type  [I10] HTN (hypertension), malignant [I10] Patient Active Problem List   Diagnosis Date Noted  . Hypertensive crisis 03/23/2019  . HTN (hypertension), malignant 03/23/2019  . Subacute intracranial hemorrhage ([place])   . Hypertensive urgency   . Right leg weakness 01/10/2019  . Right ankle swelling 07/12/2018  . Tremor, essential 02/27/2014  . Routine health maintenance 01/11/2013  . History of colonic polyps 04/10/2010  . 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 Hyperlipidemia associated with type 2 diabetes mellitus ([place]) 11/17/2007  . Diabetes mellitus type 2 with complications ([place]) 86/57/8469  . IMPOTENCE 11/16/2007  . Essential hypertension, malignant 11/16/2007  . HEMORRHOIDS 11/16/2007   PCP:  Hoyt Koch, MD Pharmacy:   [facility] [phone] [address], [place] AT [facility] OF [address] [place] [address] Phone: [phone] Fax: [place], [name] [place] [place] [address] Phone: [phone] Fax: [phone]     Social Determinants of Health (SDOH) Interventions    Readmission Risk Interventions No flowsheet data found.

## 2019-03-23 NOTE — ED Notes (Signed)
Debra(SR-Lunch Tray Ordered)@ 1039.

## 2019-03-23 NOTE — ED Notes (Signed)
CBG 124. Notified Ronalee Belts, Therapist, sports.

## 2019-03-23 NOTE — Evaluation (Signed)
Physical Therapy Evaluation Patient Details Name: Drew Harrington MRN: [id number] DOB: March 30, 1946 Today's Date: 03/23/2019   History of Present Illness  Pt is a 73 y/o male admitted secondary to increased confusion. Found to be in hypertensive crisis. Pt with known hemmorhage in L thalamus that was unchanged. PMH includes HTN and DM.   Clinical Impression  Pt admitted secondary to problem above with deficits below. Notable instability with dynamic gait tasks and pt with 1 LOB when stepping over obstacle; required min A for steadying. Pt also with shuffle gait and required cues for appropriate step height. Feel pt would benefit from outpatient PT to address higher level balance deficits. Will continue to follow acutely to maximize functional mobility independence and safety.     Follow Up Recommendations Outpatient PT    Equipment Recommendations  None recommended by PT    Recommendations for Other Services       Precautions / Restrictions Precautions Precautions: Fall Restrictions Weight Bearing Restrictions: No      Mobility  Bed Mobility Overal bed mobility: Modified Independent                Transfers Overall transfer level: Needs assistance Equipment used: None Transfers: Sit to/from Stand Sit to Stand: Min guard         General transfer comment: Min guard for safety to stand from higher stretcher height.   Ambulation/Gait Ambulation/Gait assistance: Min guard;Min assist Gait Distance (Feet): 120 Feet Assistive device: None Gait Pattern/deviations: Step-through pattern;Decreased stride length;Drifts right/left Gait velocity: Decreased    General Gait Details: Pt overall steady when performing level surface gait, however, when adding dynamic gait tasks, pt with increased instability. Required min A when performing some DGI tasks secondary to LOB. Pt also shuffling feet and required cues for step height.   Stairs            Wheelchair Mobility     Modified Rankin (Stroke Patients Only)       Balance Overall balance assessment: Needs assistance Sitting-balance support: No upper extremity supported;Feet supported Sitting balance-Leahy Scale: Good     Standing balance support: No upper extremity supported;During functional activity Standing balance-Leahy Scale: Fair                   Standardized Balance Assessment Standardized Balance Assessment : Dynamic Gait Index   Dynamic Gait Index Level Surface: Mild Impairment Gait with Horizontal Head Turns: Mild Impairment Gait with Vertical Head Turns: Mild Impairment Gait and Pivot Turn: Mild Impairment Step Over Obstacle: Severe Impairment(had LOB requiring min A)       Pertinent Vitals/Pain Pain Assessment: No/denies pain    Home Living Family/patient expects to be discharged to:: Private residence Living Arrangements: Spouse/significant other Available Help at Discharge: Family Type of Home: House Home Access: Stairs to enter Entrance Stairs-Rails: Left Entrance Stairs-Number of Steps: 6 Home Layout: Multi-level;Able to live on main level with bedroom/bathroom Home Equipment: None      Prior Function Level of Independence: Independent         Comments: Was working as an Product/process development scientist at [employer]        Extremity/Trunk Assessment   Upper Extremity Assessment Upper Extremity Assessment: Overall WFL for tasks assessed    Lower Extremity Assessment Lower Extremity Assessment: Generalized weakness    Cervical / Trunk Assessment Cervical / Trunk Assessment: Normal  Communication   Communication: No difficulties  Cognition Arousal/Alertness: Awake/alert Behavior During Therapy: WFL for tasks assessed/performed  Overall Cognitive Status: No family/caregiver present to determine baseline cognitive functioning                                 General Comments: Pt somewhat slow to answer some questions. Unsure of  baseline.       General Comments      Exercises     Assessment/Plan    PT Assessment Patient needs continued PT services  PT Problem List Decreased balance;Decreased strength;Decreased mobility       PT Treatment Interventions Gait training;Stair training;Therapeutic activities;Functional mobility training;Therapeutic exercise;Balance training;Patient/family education    PT Goals (Current goals can be found in the Care Plan section)  Acute Rehab PT Goals Patient Stated Goal: to go home PT Goal Formulation: With patient Time For Goal Achievement: 04/06/19 Potential to Achieve Goals: Good    Frequency Min 3X/week   Barriers to discharge        Co-evaluation               AM-PAC PT "6 Clicks" Mobility  Outcome Measure Help needed turning from your back to your side while in a flat bed without using bedrails?: None Help needed moving from lying on your back to sitting on the side of a flat bed without using bedrails?: None Help needed moving to and from a bed to a chair (including a wheelchair)?: A Little Help needed standing up from a chair using your arms (e.g., wheelchair or bedside chair)?: A Little Help needed to walk in hospital room?: A Little Help needed climbing 3-5 steps with a railing? : A Little 6 Click Score: 20    End of Session Equipment Utilized During Treatment: Gait belt Activity Tolerance: Patient tolerated treatment well Patient left: in bed;with call bell/phone within reach Nurse Communication: Mobility status PT Visit Diagnosis: Unsteadiness on feet (R26.81);Muscle weakness (generalized) (M62.81)    Time: 1610-9604 PT Time Calculation (min) (ACUTE ONLY): 15 min   Charges:   PT Evaluation $PT Eval Low Complexity: [place], PT, DPT  Acute Rehabilitation Services  Pager: [phone] Office: [phone]   Rudean Hitt 03/23/2019, 3:25 PM

## 2019-03-23 NOTE — ED Notes (Signed)
Pt ambulatory in hallway with no assistance. Pt tolerated well and endorsed no complaints.

## 2019-03-23 NOTE — ED Notes (Signed)
Tele ordered bfast 

## 2019-03-23 NOTE — Progress Notes (Addendum)
[facility] PROGRESS NOTE  Drew Harrington UXN:[id number] DOB: Aug 12, 1945 DOA: 03/22/2019 PCP: Hoyt Koch, MD  Assessment/Plan:  Malignant HTN - Patient initially required Cardene drip and was evaluated by critical care in the ED but was recommended to discontinue Cardene drip due to improvement in blood pressure. Home meds include norvasc, inderal, losartan, HCTZ and recently catapress patch added. Evaluated by critical care who recommend removal of catapress patch and resume home med. Also recommended SBP between 160-180. Of note, chart review indicates norvasc discontinued by PCP due to wife's concern for POTENTIAL swelling. On exam no swelling noted.  - will keep monitoring blood pressure as systolics of 254-270 - will resume all home medication except catapress patch -Amlodipine 10 mg.  Losartan 100 mg daily.  HCTZ 25 mg daily inderal 10 mg hydralazine PRN q6hrs  - monitor BP closely--- if not improved, may need imaging of his renal arteries to r/o stenosis-- had CT scan in 2009 and was negative at that time   History of subacute intracranial hemorrhage. 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  -Continue to monitor with neuro checks -Physical therapy for residual right foot weakness- outpatient PT   Type 2 diabetes. HgA1c 6.3.  -Hold metformin -Start low-dose sliding scale   Essential tremors -Stable -Continue primidone -Continue propanolol ER  Hypokalemia. Potassium 3.1 this am.  -replete -recheck in am    Code Status: full Family Communication: wife updated Disposition Plan: home tomorrow if BP improved     HPI/Subjective: Awake alert oriented. Denies pain or discomfort  Objective: Vitals:   03/23/19 1130 03/23/19 1200  BP: 126/90 129/80  Pulse: (!) 58 62  Resp: 15 13  Temp:    SpO2: 96% 95%   No intake or output data in the 24 hours ending 03/23/19 1500 Filed  Weights   03/22/19 1657  Weight: 90.7 kg    Exam:  General:  Well nourished, alert no acute distress Cardiovascular: rrr no mgr no LE edema Respiratory: normal effort BS clear bilaterally no wheeze Abdomen: soft +BS no guarding or rebounding Musculoskeletal: joints without swelling/erythema Neuro: alert and oriented x3. Bilateral grip 5/5. LE strength 5/5 bilaterall   Data Reviewed: Basic Metabolic Panel: Recent Labs  Lab 03/22/19 1722 03/23/19 0321  NA 139 139  K 3.6 3.1*  CL 102 100  CO2 27 27  GLUCOSE 132* 113*  BUN 16 13  CREATININE 1.12 1.01  CALCIUM 9.7 9.4   Liver Function Tests: Recent Labs  Lab 03/22/19 1722  AST 20  ALT 31  ALKPHOS 204*  BILITOT 0.4  PROT 7.2  ALBUMIN 3.6   No results for input(s): LIPASE, AMYLASE in the last 168 hours. No results for input(s): AMMONIA in the last 168 hours. CBC: Recent Labs  Lab 03/22/19 1722 03/23/19 0321  WBC 7.4 6.8  NEUTROABS 4.8  --   HGB 16.6 16.1  HCT 47.2 47.5  MCV 96.9 98.8  PLT 255 260   Cardiac Enzymes: No results for input(s): CKTOTAL, CKMB, CKMBINDEX, TROPONINI in the last 168 hours. BNP (last 3 results) No results for input(s): BNP in the last 8760 hours.  ProBNP (last 3 results) Recent Labs    07/12/18 0938  PROBNP 115.0*    CBG: Recent Labs  Lab 03/23/19 0742  GLUCAP 117*    Recent Results (from the past 240 hour(s))  SARS CORONAVIRUS 2 (TAT 6-24 HRS) Nasopharyngeal Nasopharyngeal Swab     Status: None  Collection Time: 03/22/19  6:09 PM   Specimen: Nasopharyngeal Swab  Result Value Ref Range Status   SARS Coronavirus 2 NEGATIVE NEGATIVE Final    Comment: (NOTE) SARS-CoV-2 target nucleic acids are NOT DETECTED. The SARS-CoV-2 RNA is generally detectable in upper and lower respiratory specimens during the acute phase of infection. Negative results do not preclude SARS-CoV-2 infection, do not rule out co-infections with other pathogens, and should not be used as the sole  basis for treatment or other patient management decisions. Negative results must be combined with clinical observations, patient history, and epidemiological information. The expected result is Negative. Fact Sheet for Patients: SugarRoll.be Fact Sheet for Healthcare Providers: https://www.woods-mathews.com/ This test is not yet approved or cleared by the [place] FDA and  has been authorized for detection and/or diagnosis of SARS-CoV-2 by FDA under an Emergency Use Authorization (EUA). This EUA will remain  in effect (meaning this test can be used) for the duration of the COVID-19 declaration under Section 56 4(b)(1) of the Act, 21 U.S.C. section 360bbb-3(b)(1), unless the authorization is terminated or revoked sooner. Performed at [facility], [place] [address], [place], [place] [address]      Studies: Ct Head Wo Contrast  Result Date: 03/22/2019 CLINICAL DATA:  Transient altered mental status. EXAM: CT HEAD WITHOUT CONTRAST TECHNIQUE: Contiguous axial images were obtained from the base of the skull through the vertex without intravenous contrast. COMPARISON:  MR brain dated March 19, 2019. FINDINGS: Brain: Grossly unchanged 1.2 x 1.0 x 1.1 cm subacute hemorrhage in the left thalamus with mild surrounding edema extending into the posterior limb of the internal capsule. Old lacunar infarcts involving the left basal ganglia. Small chronic infarct in the left occipital lobe. Stable mild chronic microvascular ischemic changes. No evidence of acute infarction, hydrocephalus, extra-axial collection or mass lesion/mass effect. Vascular: No hyperdense vessel or unexpected calcification. Skull: Normal. Negative for fracture or focal lesion. Sinuses/Orbits: No acute finding. Mild paranasal sinus mucosal thickening. Other: None. IMPRESSION: 1. Grossly unchanged 1.2 cm subacute hemorrhage in the left thalamus with mild surrounding edema. No new or  progressive intracranial hemorrhage. 2. 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 Electronically Signed   By: Titus Dubin M.D.   On: 03/22/2019 18:53    Scheduled Meds:  amLODipine  10 mg Oral Daily   losartan  100 mg Oral Daily   And   hydrochlorothiazide  25 mg Oral Daily   insulin aspart  0-9 Units Subcutaneous TID WC   [START ON 03/24/2019] potassium chloride SA  20 mEq Oral Daily   primidone  750 mg Oral See admin instructions   propranolol ER  120 mg Oral Daily   simvastatin  20 mg Oral Daily   Continuous Infusions:   Principal Problem:   Hypertensive crisis Active Problems:   Diabetes mellitus type 2 with complications (HCC)   Tremor, essential   Right leg weakness    Time spent: 40 minutes    R[place] NP  [facility]  If 7PM-7AM, please contact night-coverage at [email or web address], password [facility] 03/23/2019, 3:00 PM  LOS: 0 days

## 2019-03-23 NOTE — H&P (Signed)
History and Physical    Drew Harrington [id number] DOB: May 16, 1946 DOA: 03/22/2019  PCP: Hoyt Koch, MD  Patient coming from: Home. 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  I have personally briefly reviewed patient's old medical records in [place]  Chief Complaint: Confusion  HPI: Drew Harrington is a 73 y.o. male with medical history significant of hypertension, type 2 diabetes,essential tremors and recent diagnosis of subacute hemorrhage in the left thalamus who presents with concerns of increasing confusion.  Much of the history was obtained from wife on the phone as patient had difficulty recalling all of the details surrounding his admission.  Wife reports that starting in March she has already noticed that he had a more irritable mood.  Then in September she began to notice that he had a right foot drag and was walking "sideways."  He then presented to the PCP who recommended an MRI this week and reportedly was found to have a subacute hemorrhage.  He was recommended to keep his blood pressure under control.  However, today patient was at the bank and was unable to recall his pin number after numerous attempts so his wife checked his blood pressure and was found it to be elevated up to systolic of 448J prompting wife called EMS for further evaluation. Patient at this time denies any headache or vision changes.  Denies any chest pain or shortness of breath.  Denies any numbness or weakness.  Denies any tobacco, alcohol or illicit drug use.  ED Course: Patient was well-appearing and afebrile.  He initially presented with a blood pressure of 208/102 and was started on a Cardene drip for concerns of hypertensive encephalopathy.  He had a CT head that showed a stable unchanged 1.2 subacute hemorrhage in the left thalamus with mild surrounding edema.  There was no new or progressive intracranial hemorrhage.  There is also a chronic left basal ganglia  and left occipital parietal infarct.  He was initially evaluated by critical care due to his Cardene drip requirement but was recommended to discontinue Cardene drip given improvement in his blood pressure. EDP also reportedly spoke with neurology Dr. Cheral Marker who recommended blood pressure control and can call with any further questions.   Review of Systems:  Constitutional: No Weight Change, No Fever ENT/Mouth: No sore throat, No Rhinorrhea Eyes: No Eye Pain, No Vision Changes Cardiovascular: No Chest Pain, no SOB Respiratory: No Cough, No Sputum, No Wheezing, no Dyspnea  Gastrointestinal: No Nausea, No Vomiting, No Diarrhea, No Constipation, No Pain Genitourinary: no Urinary Incontinence, No Urgency, No Flank Pain Musculoskeletal: No Arthralgias, No Myalgias Skin: No Skin Lesions, No Pruritus, Neuro: no Weakness, No Numbness,  No Loss of Consciousness, No Syncope Psych: No Anxiety/Panic, No Depression, decrease appetite Heme/Lymph: No Bruising, No Bleeding  Past Medical History:  Diagnosis Date   Allergic rhinitis    Benign neoplasm of colon    Bladder cancer (HCC)    Borderline diabetes    HTN (hypertension)    Insomnia, unspecified    Perirectal abscess 05/10/2013   Personal history of colonic adenomas 04/10/2010   Psychosexual dysfunction with inhibited sexual excitement    Unspecified hemorrhoids without mention of complication     Past Surgical History:  Procedure Laterality Date   CATARACT EXTRACTION W/ INTRAOCULAR LENS  IMPLANT, BILATERAL     COLONOSCOPY     removal cyst hip Left 05/23/2013   TRANSTHORACIC ECHOCARDIOGRAM  04-06-2006   LVSF NORMAL/  EF  60%/ AORTIC ROOT AT UPPER LIMITS OF NORMAL SIZE   TRANSURETHRAL RESECTION OF BLADDER TUMOR  10/08/2011   Procedure: CIRCUMCISION AND TRANSURETHRAL RESECTION OF BLADDER TUMOR (TURBT);  Surgeon: [name], MD;  Location[facility];  Service: Urology;  Laterality: N/A;    TRANSURETHRAL RESECTION OF BLADDER TUMOR  11/30/2011   Procedure: TRANSURETHRAL RESECTION OF BLADDER TUMOR (TURBT);  Surgeon: [name], MD;  Location[facility];  Service: Urology;  Laterality: N/A;     reports that he quit smoking about 25 years ago. His smokeless tobacco use includes chew. He reports that he does not drink alcohol or use drugs.  Allergies  Allergen Reactions   Sildenafil Other (See Comments)    Caused a headache    Family History  Problem Relation Age of Onset   Lung disease Father    Cancer Father        lung   Cancer Sister        lung   Cancer Sister        lung   Cancer Sister        lung   Gout Other    Heart disease Other    Hypertension Other    Colon cancer Neg Hx     Family history reviewed and not pertinent   Prior to Admission medications   Medication Sig Start Date End Date Taking? Authorizing Provider  cloNIDine (CATAPRES - DOSED IN MG/24 HR) 0.2 mg/24hr patch Place 1 patch (0.2 mg total) onto the skin once a week. Patient taking differently: Place 0.2 mg onto the skin every Tuesday.  03/12/19  Yes Hoyt Koch, MD  ibuprofen (ADVIL) 200 MG tablet Take 200 mg by mouth every 6 (six) hours as needed for headache or mild pain.   Yes [provider]  losartan-hydrochlorothiazide (HYZAAR) 100-25 MG tablet TAKE 1 TABLET BY MOUTH DAILY 02/28/19  Yes Hoyt Koch, MD  metFORMIN (GLUCOPHAGE) 1000 MG tablet TAKE 1 TABLET(1000 MG) BY MOUTH TWICE DAILY WITH A MEAL Patient taking differently: Take 1,000 mg by mouth 2 (two) times daily with a meal.  12/04/18  Yes Hoyt Koch, MD  Multiple Vitamins-Minerals (CENTRUM SILVER PO) Take 1 tablet by mouth 2 (two) times a week.    Yes [provider]  naproxen sodium (ALEVE) 220 MG tablet Take 220-440 mg by mouth 2 (two) times daily as needed (for pain or headaches).   Yes [provider]  potassium chloride SA (K-DUR) 20 MEQ  tablet TAKE 1 TABLET(20 MEQ) BY MOUTH DAILY Patient taking differently: Take 20 mEq by mouth daily.  02/28/19  Yes Hoyt Koch, MD  primidone (MYSOLINE) 250 MG tablet Take 750 mg by mouth See admin instructions. Take 750 mg by mouth in the morning 11/07/18  Yes [provider]  propranolol ER (INDERAL LA) 120 MG 24 hr capsule TAKE 1 CAPSULE(120 MG) BY MOUTH DAILY Patient taking differently: Take 120 mg by mouth daily.  01/22/19  Yes Hoyt Koch, MD  simvastatin (ZOCOR) 20 MG tablet TAKE 1 TABLET(20 MG) BY MOUTH AT BEDTIME Patient taking differently: Take 20 mg by mouth daily.  02/28/19  Yes Hoyt Koch, MD  amLODipine (NORVASC) 10 MG tablet Take 10 mg by mouth daily.    [provider]    Physical Exam: Vitals:   03/22/19 2215 03/22/19 2300 03/22/19 2315 03/23/19 0000  BP: 127/87 134/86 131/79 (!) 160/88  Pulse: 62 64 63 60  Resp:  15 16 15 15   Temp:      TempSrc:      SpO2: 95% 98% 98% 96%  Weight:      Height:        Constitutional: NAD, calm, comfortable Vitals:   03/22/19 2215 03/22/19 2300 03/22/19 2315 03/23/19 0000  BP: 127/87 134/86 131/79 (!) 160/88  Pulse: 62 64 63 60  Resp: 15 16 15 15   Temp:      TempSrc:      SpO2: 95% 98% 98% 96%  Weight:      Height:       Eyes: PERRL, lids and conjunctivae normal ENMT: Mucous membranes are moist. Posterior pharynx clear of any exudate or lesions.Normal dentition.  Neck: normal, supple, no masses Respiratory: clear to auscultation bilaterally, no wheezing, no crackles. Normal respiratory effort. No accessory muscle use.  Cardiovascular: Regular rate and rhythm, no murmurs / rubs / gallops. No extremity edema. Abdomen: no tenderness, no masses palpated. No hepatosplenomegaly. Bowel sounds positive.  Musculoskeletal: no clubbing / cyanosis. No joint deformity upper and lower extremities. Good ROM, no contractures. Normal muscle tone.  Skin: no rashes, lesions, ulcers. No  induration Neurologic: CN 2-12 grossly intact. Sensation intact, DTR normal. Strength 5/5 in all 4.  No facial asymmetry.  Normal finger-to-nose.  Intact heel-to-shin.  No noted resting tremor. Psychiatric: Normal judgment and insight. Alert and oriented x 3. Normal mood.   Labs on Admission: I have personally reviewed following labs and imaging studies  CBC: Recent Labs  Lab 03/22/19 1722  WBC 7.4  NEUTROABS 4.8  HGB 16.6  HCT 47.2  MCV 96.9  PLT 607   Basic Metabolic Panel: Recent Labs  Lab 03/22/19 1722  NA 139  K 3.6  CL 102  CO2 27  GLUCOSE 132*  BUN 16  CREATININE 1.12  CALCIUM 9.7   GFR: Estimated Creatinine Clearance: 64.4 mL/min (by C-G formula based on SCr of 1.12 mg/dL). Liver Function Tests: Recent Labs  Lab 03/22/19 1722  AST 20  ALT 31  ALKPHOS 204*  BILITOT 0.4  PROT 7.2  ALBUMIN 3.6   No results for input(s): LIPASE, AMYLASE in the last 168 hours. No results for input(s): AMMONIA in the last 168 hours. Coagulation Profile: Recent Labs  Lab 03/22/19 1722  INR 1.1   Cardiac Enzymes: No results for input(s): CKTOTAL, CKMB, CKMBINDEX, TROPONINI in the last 168 hours. BNP (last 3 results) Recent Labs    07/12/18 0938  PROBNP 115.0*   HbA1C: No results for input(s): HGBA1C in the last 72 hours. CBG: No results for input(s): GLUCAP in the last 168 hours. Lipid Profile: No results for input(s): CHOL, HDL, LDLCALC, TRIG, CHOLHDL, LDLDIRECT in the last 72 hours. Thyroid Function Tests: No results for input(s): TSH, T4TOTAL, FREET4, T3FREE, THYROIDAB in the last 72 hours. Anemia Panel: No results for input(s): VITAMINB12, FOLATE, FERRITIN, TIBC, IRON, RETICCTPCT in the last 72 hours. Urine analysis:    Component Value Date/Time   COLORURINE STRAW (A) 03/23/2019 0003   APPEARANCEUR CLEAR 03/23/2019 0003   LABSPEC 1.010 03/23/2019 0003   PHURINE 6.0 03/23/2019 0003   GLUCOSEU NEGATIVE 03/23/2019 0003   GLUCOSEU NEGATIVE 06/23/2009 1232    HGBUR NEGATIVE 03/23/2019 0003   BILIRUBINUR NEGATIVE 03/23/2019 0003   BILIRUBINUR neg 08/23/2011 1314   KETONESUR NEGATIVE 03/23/2019 0003   PROTEINUR NEGATIVE 03/23/2019 0003   UROBILINOGEN 0.2 08/23/2011 1314   UROBILINOGEN 0.2 06/23/2009 1232   NITRITE NEGATIVE 03/23/2019 0003   LEUKOCYTESUR NEGATIVE 03/23/2019 0003  Radiological Exams on Admission: Ct Head Wo Contrast  Result Date: 03/22/2019 CLINICAL DATA:  Transient altered mental status. EXAM: CT HEAD WITHOUT CONTRAST TECHNIQUE: Contiguous axial images were obtained from the base of the skull through the vertex without intravenous contrast. COMPARISON:  MR brain dated March 19, 2019. FINDINGS: Brain: Grossly unchanged 1.2 x 1.0 x 1.1 cm subacute hemorrhage in the left thalamus with mild surrounding edema extending into the posterior limb of the internal capsule. Old lacunar infarcts involving the left basal ganglia. Small chronic infarct in the left occipital lobe. Stable mild chronic microvascular ischemic changes. No evidence of acute infarction, hydrocephalus, extra-axial collection or mass lesion/mass effect. Vascular: No hyperdense vessel or unexpected calcification. Skull: Normal. Negative for fracture or focal lesion. Sinuses/Orbits: No acute finding. Mild paranasal sinus mucosal thickening. Other: None. IMPRESSION: 1. Grossly unchanged 1.2 cm subacute hemorrhage in the left thalamus with mild surrounding edema. No new or progressive intracranial hemorrhage. 2. 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 Electronically Signed   By: Titus Dubin M.D.   On: 03/22/2019 18:53    EKG: Independently reviewed.   Assessment/Plan  hypertensive urgency -Patient initially required Cardene drip and was evaluated by critical care in the ED but was recommended to discontinue Cardene drip due to improvement in blood pressure. - will keep blood pressure with systolics of 161-096 - will resume all home medication in the  morning. -Amlodipine 10 mg.  Losartan 100 mg daily.  HCTZ 25 mg daily. 10 mg hydralazine PRN q6hrs  - keep on telemetry with neurochecks   History of subacute intracranial hemorrhage -Stable per CT head on admission -Continue to monitor with neuro checks -Physical therapy for residual right foot weakness  Type 2 diabetes  - check HbA1C -Hold home medications -Start low-dose sliding scale  Essential tremors -Stable -Continue primidone -Continue propanolol ER    DVT prophylaxis: SCDs Code Status: Full Family Communication: Plan discussed with patient at bedside and with wife over the phone.  Wife, [name], can be reached at [phone] Disposition Plan: Home with observation to monitor blood pressure and mental status Consults called:  Admission status: Observation   Meeghan Skipper T Luigi Stuckey DO [facility]   If 7PM-7AM, please contact night-coverage [email or web address] Password [email or web address]  03/23/2019, 1:23 AM

## 2019-03-24 LAB — BASIC METABOLIC PANEL
Anion gap: 13 (ref 5–15)
BUN: 13 mg/dL (ref 8–23)
CO2: 27 mmol/L (ref 22–32)
Calcium: 9.9 mg/dL (ref 8.9–10.3)
Chloride: 100 mmol/L (ref 98–111)
Creatinine, Ser: 1.1 mg/dL (ref 0.61–1.24)
GFR calc Af Amer: >60 mL/min (ref >60)
GFR calc non Af Amer: >60 mL/min (ref >60)
Glucose, Bld: 103 mg/dL — ABNORMAL HIGH (ref 70–99)
Potassium: 3.4 mmol/L — ABNORMAL LOW (ref 3.5–5.1)
Sodium: 140 mmol/L (ref 135–145)

## 2019-03-24 LAB — GLUCOSE, CAPILLARY: Glucose-Capillary: 124 mg/dL — ABNORMAL HIGH (ref 70–99)

## 2019-03-24 MED ORDER — LABETALOL HCL 5 MG/ML IV SOLN
5.0000 mg | Freq: Once | INTRAVENOUS | Status: AC
  Administered 2019-03-24: 5 mg via INTRAVENOUS
  Filled 2019-03-24: qty 4

## 2019-03-24 MED ORDER — ISOSORB DINITRATE-HYDRALAZINE 20-37.5 MG PO TABS
1.0000 | ORAL_TABLET | Freq: Three times a day (TID) | ORAL | Status: DC
  Administered 2019-03-24: 1 via ORAL
  Filled 2019-03-24: qty 1

## 2019-03-24 MED ORDER — ISOSORB DINITRATE-HYDRALAZINE 20-37.5 MG PO TABS: 1.0000 | ORAL_TABLET | Freq: Three times a day (TID) | ORAL | 0 refills | Status: DC

## 2019-03-24 MED ORDER — ISOSORB DINITRATE-HYDRALAZINE 20-37.5 MG PO TABS: 1.0000 | ORAL_TABLET | Freq: Two times a day (BID) | ORAL | 0 refills | Status: DC | PRN

## 2019-03-24 NOTE — Progress Notes (Signed)
   03/24/19 1033  Pressure Injury Prevention  Positioning Frequency Able to turn self  Mobility  Activity Ambulated in hall;Ambulated in room  Range of Motion Active;All extremities  Level of Assistance Independent after set-up  Assistive Device None (standby assist)  Minutes Stood 5 minutes  Minutes Ambulated 5 minutes  Distance Ambulated (ft) 200 ft  Mobility Response Tolerated fair

## 2019-03-24 NOTE — Progress Notes (Addendum)
Patient transferred to "discharge unit 5C". Please see ambulation note below. VSS. Discharge instruction reviewed with patient. Patient is aware to check B/P daily and follow medication administration of bidil. Awaiting for transportation from spouse.    Addendum: AVS reviewed again with spouse, Mrs. Woodford at this time. She verbalized understanding of instruction to check Mr. Mccarey B/P daily and administration of bidil.   Ave Filter, RN

## 2019-03-24 NOTE — Discharge Summary (Addendum)
Physician Discharge Summary  Drew Harrington YSA:[id number] DOB: [birth year] DOA: 03/22/2019  PCP: Hoyt Koch, MD  Admit date: 03/22/2019 Discharge date: 03/24/2019  Time spent: 40 minutes  Recommendations for Outpatient Follow-up:  1. Suggest not using clonidine in the outpatient setting as might have high risk for rebound hypertension if he misses a dose-continue to all blood pressure meds in addition to BiDil which was added to patient's meds-he may require this medication to be broken into its constituents if it is not affordable I have only given him 1 month supply in that case 2. Needs repeat scans of head and labs as per PCP/neurologist-neurologist will follow in the outpatient setting 3. Recommend outpatient GAD 7 versus MMSE as slightly slow mentation-he has habitus and facies of someone with Parkinson's--please consider trial of Sinemet after seen by neurologist  Discharge Diagnoses:  Principal Problem:   Hypertensive crisis Active Problems:   Diabetes mellitus type 2 with complications ([place])   Tremor, essential   Right leg weakness   HTN (hypertension), malignant   Discharge Condition: Improved  Diet recommendation: Heart healthy  Filed Weights   03/22/19 1657  Weight: 90.7 kg    History of present illness:  73 year old male known DM TY 2 HTN which is difficult to control Admit 03/23/2019 with progressive  leg weakness, double vision-MRI 9/21 because of-these issues which showed subacute bleed -Patient initially Cardene drip-was mentating well protecting airway transition to oral meds in ED  Hospital Course:  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   Consultations:  Critical care  Neurology  Discharge Exam: Vitals:   03/24/19 0625 03/24/19 0753  BP: (!) 164/95 (!) 182/97  Pulse: 67 61  Resp:  17  Temp:  98 F (36.7 C)  SpO2: 99% 97%    General: Awake alert coherent slightly slow mentation otherwise looks well Smile symmetric EOMI NCAT no icterus no pallor sternocleidomastoid is intact shoulder shrug intact arm raise above head intact wrinkles forehead Uvula midline Cardiovascular: S1-S2 no murmur rub or gallop Respiratory: Clinically clear no added sound Abdomen soft nontender nondistended no rebound Power 5/5 no focal deficit Reflexes 2/3  Discharge Instructions   Discharge Instructions    Ambulatory referral to Physical Therapy   Complete by: As directed    Diet - low sodium heart healthy   Complete by: As directed    Discharge instructions   Complete by: As directed    Take all ur meds as indicated--new meds BIDIL--follow with PCP for refills Labs 1 week at PCP [facility] office   Increase activity slowly   Complete by: As directed      Allergies as of 03/24/2019      Reactions   Sildenafil Other (See Comments)   Caused a headache      Medication List    STOP taking these medications   cloNIDine 0.2 mg/24hr patch Commonly known as: CATAPRES - Dosed in mg/24 hr   ibuprofen 200 MG tablet Commonly known as: ADVIL  TAKE these medications   amLODipine 10 MG tablet Commonly known as: NORVASC Take 10 mg by mouth daily.   CENTRUM SILVER PO Take 1 tablet by mouth 2 (two) times a week.   isosorbide-hydrALAZINE 20-37.5 MG tablet Commonly known as: BIDIL Take 1 tablet by mouth 2 (two) times daily as needed (Systolic blood pressure  >478).   losartan-hydrochlorothiazide 100-25 MG tablet Commonly known as: HYZAAR TAKE 1 TABLET BY MOUTH DAILY   metFORMIN 1000 MG tablet Commonly known as: GLUCOPHAGE TAKE 1 TABLET(1000 MG) BY MOUTH TWICE DAILY WITH A MEAL What changed: See the new instructions.   naproxen sodium 220 MG tablet Commonly known as: ALEVE Take 220-440 mg by mouth 2 (two) times daily as needed (for pain or headaches).   potassium chloride SA 20 MEQ tablet Commonly known as: K-DUR TAKE 1 TABLET(20 MEQ) BY MOUTH DAILY What changed: See the new instructions.   primidone 250 MG tablet Commonly known as: MYSOLINE Take 750 mg by mouth See admin instructions. Take 750 mg by mouth in the morning   propranolol ER 120 MG 24 hr capsule Commonly known as: INDERAL LA TAKE 1 CAPSULE(120 MG) BY MOUTH DAILY What changed: See the new instructions.   simvastatin 20 MG tablet Commonly known as: ZOCOR TAKE 1 TABLET(20 MG) BY MOUTH AT BEDTIME What changed: See the new instructions.      Allergies  Allergen Reactions  . Sildenafil Other (See Comments)    Caused a headache   Follow-up Information    [facility].   Specialty: Rehabilitation Why: The outpatient therapy will contact you for the first appointment Contact information: [address] [place] [phone]           The results of significant diagnostics from this hospitalization (including imaging, microbiology, ancillary and laboratory) are listed below for reference.    Significant Diagnostic Studies: Ct Head Wo Contrast  Result Date: 03/22/2019 CLINICAL DATA:  Transient altered mental status. EXAM: CT HEAD WITHOUT CONTRAST TECHNIQUE: Contiguous axial images were obtained from the base of the skull through the vertex without intravenous contrast. COMPARISON:  MR brain dated March 19, 2019. FINDINGS: Brain: Grossly unchanged 1.2 x 1.0 x  1.1 cm subacute hemorrhage in the left thalamus with mild surrounding edema extending into the posterior limb of the internal capsule. Old lacunar infarcts involving the left basal ganglia. Small chronic infarct in the left occipital lobe. Stable mild chronic microvascular ischemic changes. No evidence of acute infarction, hydrocephalus, extra-axial collection or mass lesion/mass effect. Vascular: No hyperdense vessel or unexpected calcification. Skull: Normal. Negative for fracture or focal lesion. Sinuses/Orbits: No acute finding. Mild paranasal sinus mucosal thickening. Other: None. IMPRESSION: 1. Grossly unchanged 1.2 cm subacute hemorrhage in the left thalamus with mild surrounding edema. No new or progressive intracranial hemorrhage. 2. 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 Electronically Signed   By: Titus Dubin M.D.   On: 03/22/2019 18:53   Mr Brain Wo Contrast  Result Date: 03/19/2019 CLINICAL DATA:  Worsening right leg weakness since July. EXAM: MRI HEAD WITHOUT CONTRAST TECHNIQUE: Multiplanar, multiecho pulse sequences of the brain and surrounding structures were obtained without intravenous contrast. COMPARISON:  None. FINDINGS: Brain: There is a 1.4 x 1.1 x 1.1 cm T1 hyperintense, T2 hypointense left thalamic hemorrhage with mild edema extending into the posterior limb of the left internal capsule. There are chronic blood products associated with chronic infarcts involving the left caudate and lentiform nuclei. There  is a small chronic infarct superomedially in the left occipital lobe, and there is also a chronic lacunar infarct in the left thalamus dorsal to the hemorrhage. Scattered T2 hyperintensities in the cerebral white matter bilaterally are nonspecific but compatible with mild chronic small vessel ischemic disease. No acute infarct, midline shift, or extra-axial fluid collection is evident. There is mild cerebral atrophy. Vascular: Major intracranial vascular flow voids  are preserved. Skull and upper cervical spine: Unremarkable bone marrow signal. Sinuses/Orbits: Bilateral cataract extraction. Moderate posterior left ethmoid air cell mucosal thickening. Clear mastoid air cells. Other: None. IMPRESSION: 1. 1.4 cm left thalamic hemorrhage, subacute in appearance. Mild edema without significant mass effect. 2. Chronic left basal ganglia, left thalamic, and left occipital infarcts. Critical Value/emergent results were called by telephone at the time of interpretation on 03/19/2019 at 3:23 pm to Dr. Pricilla Holm , who verbally acknowledged these results. Electronically Signed   By: Logan Bores M.D.   On: 03/19/2019 15:25    Microbiology: Recent Results (from the past 240 hour(s))  SARS CORONAVIRUS 2 (TAT 6-24 HRS) Nasopharyngeal Nasopharyngeal Swab     Status: None   Collection Time: 03/22/19  6:09 PM   Specimen: Nasopharyngeal Swab  Result Value Ref Range Status   SARS Coronavirus 2 NEGATIVE NEGATIVE Final    Comment: (NOTE) SARS-CoV-2 target nucleic acids are NOT DETECTED. The SARS-CoV-2 RNA is generally detectable in upper and lower respiratory specimens during the acute phase of infection. Negative results do not preclude SARS-CoV-2 infection, do not rule out co-infections with other pathogens, and should not be used as the sole basis for treatment or other patient management decisions. Negative results must be combined with clinical observations, patient history, and epidemiological information. The expected result is Negative. Fact Sheet for Patients: SugarRoll.be Fact Sheet for Healthcare Providers: https://www.woods-mathews.com/ This test is not yet approved or cleared by the [place] FDA and  has been authorized for detection and/or diagnosis of SARS-CoV-2 by FDA under an Emergency Use Authorization (EUA). This EUA will remain  in effect (meaning this test can be used) for the duration of  the COVID-19 declaration under Section 56 4(b)(1) of the Act, 21 U.S.C. section 360bbb-3(b)(1), unless the authorization is terminated or revoked sooner. Performed at [facility], [place] [address], [place],  [address]      Labs: Basic Metabolic Panel: Recent Labs  Lab 03/22/19 1722 03/23/19 0321 03/24/19 0317  NA 139 139 140  K 3.6 3.1* 3.4*  CL 102 100 100  CO2 27 27 27   GLUCOSE 132* 113* 103*  BUN 16 13 13   CREATININE 1.12 1.01 1.10  CALCIUM 9.7 9.4 9.9   Liver Function Tests: Recent Labs  Lab 03/22/19 1722  AST 20  ALT 31  ALKPHOS 204*  BILITOT 0.4  PROT 7.2  ALBUMIN 3.6   No results for input(s): LIPASE, AMYLASE in the last 168 hours. No results for input(s): AMMONIA in the last 168 hours. CBC: Recent Labs  Lab 03/22/19 1722 03/23/19 0321  WBC 7.4 6.8  NEUTROABS 4.8  --   HGB 16.6 16.1  HCT 47.2 47.5  MCV 96.9 98.8  PLT 255 260   Cardiac Enzymes: No results for input(s): CKTOTAL, CKMB, CKMBINDEX, TROPONINI in the last 168 hours. BNP: BNP (last 3 results) No results for input(s): BNP in the last 8760 hours.  ProBNP (last 3 results) Recent Labs    07/12/18 0938  PROBNP 115.0*    CBG: Recent Labs  Lab 03/23/19 0742 03/23/19  [place]       Signed:  Nita Sells MD   [facility] 03/24/2019, 8:53 AM

## 2019-03-26 ENCOUNTER — Telehealth: Payer: Self-pay | Admitting: *Deleted

## 2019-03-26 LAB — GLUCOSE, CAPILLARY
Glucose-Capillary: 154 mg/dL — ABNORMAL HIGH (ref 70–99)
Glucose-Capillary: 113 mg/dL — ABNORMAL HIGH (ref 70–99)

## 2019-03-26 NOTE — Telephone Encounter (Signed)
Transition Care Management Follow-up Telephone Call   Date discharged? 03/24/19   How have you been since you were released from the hospital? Pt states he is doing pretty good   Do you understand why you were in the hospital? YES   Do you understand the discharge instructions? YES   Where were you discharged to? Home   Items Reviewed:  Medications reviewed: YES  Allergies reviewed: YES  Dietary changes reviewed: YES, heart healthy  Referrals reviewed: YES, pt states no one has contacted him yet from the outpatient rehab yet   Functional Questionnaire:   Activities of Daily Living (ADLs):   He states he are independent in the following: ambulation, bathing and hygiene, feeding, continence, grooming, toileting and dressing States he doesn't require assistance    Any transportation issues/concerns?: NO   Any patient concerns? NO   Confirmed importance and date/time of follow-up visits scheduled YES. Pt had already had f/u appt scheduled for 03/28/19. Inform will change appt type to hosp f/u instead   Provider Appointment booked with Dr. [name]   Confirmed with patient if condition begins to worsen call PCP or go to the ER.  Patient was given the office number and encouraged to call back with question or concerns.  : YES

## 2019-03-28 ENCOUNTER — Encounter: Payer: Self-pay | Admitting: Internal Medicine

## 2019-03-28 ENCOUNTER — Other Ambulatory Visit: Payer: Self-pay

## 2019-03-28 ENCOUNTER — Ambulatory Visit (INDEPENDENT_AMBULATORY_CARE_PROVIDER_SITE_OTHER): Payer: Medicare Other | Admitting: Internal Medicine

## 2019-03-28 VITALS — BP 160/100 | HR 80 | Temp 98.7°F | Ht 69.0 in | Wt 191.0 lb

## 2019-03-28 DIAGNOSIS — I10 Essential (primary) hypertension: Secondary | ICD-10-CM | POA: Diagnosis not present

## 2019-03-28 DIAGNOSIS — I619 Nontraumatic intracerebral hemorrhage, unspecified: Secondary | ICD-10-CM | POA: Diagnosis not present

## 2019-03-28 DIAGNOSIS — I629 Nontraumatic intracranial hemorrhage, unspecified: Secondary | ICD-10-CM | POA: Diagnosis not present

## 2019-03-28 DIAGNOSIS — I69359 Hemiplegia and hemiparesis following cerebral infarction affecting unspecified side: Secondary | ICD-10-CM | POA: Insufficient documentation

## 2019-03-28 MED ORDER — HYDRALAZINE HCL 25 MG PO TABS: 25.0000 mg | ORAL_TABLET | Freq: Three times a day (TID) | ORAL | 6 refills | Status: DC

## 2019-03-28 NOTE — Patient Instructions (Addendum)
We have sent in hydralazine to take 1 pill 3 times daily to help with blood pressure.   We will arrange for the ultrasound of the neck (carotids) and heart to check for any cause of the strokes.   Call us in 1-2 weeks with blood pressure or if it is high even sooner for advice.

## 2019-03-28 NOTE — Assessment & Plan Note (Signed)
Stable on CT and is not acute. Does not need further imaging to follow up. Did discuss with patient and wife about old strokes as well as vascular disease on imaging.

## 2019-03-28 NOTE — Assessment & Plan Note (Signed)
BP elevated again today. Rx hydralazine 25 mg TID and continue losartan/hctz and amlodipine and propranolol.

## 2019-03-28 NOTE — Assessment & Plan Note (Signed)
Checking carotid US and echo for evaluation. Has no known A fib. Likely caused by uncontrolled hypertension. He was sent home with BP medication that was too expensive and he did not fill. Will continue losartan/hctz and amlodipine and propranolol. Add hydralazine 25 mg TID. Back in 1 month and call with readings in 1-2 weeks. Asked him to follow up with his neurologist and offered to make an appointment with one closer but he wishes to continue with his neurologist. There was some suggestion of parkinson's in the hospital but he was not evaluated by neurologist and the essential tremor is a longstanding diagnosis.

## 2019-03-28 NOTE — Progress Notes (Signed)
   Subjective:   Patient ID: Drew Harrington, male    DOB: [birth year], 73 y.o.   MRN: [id number]  HPI The patient is a 73 YO man coming in for hospital follow up (in for high BP and concerns about confusion, repeat CT head with stable hemorrhagic infarct, no stroke evaluation done as subacute, BP controlled inpatient and prescribed bidil instead of clonidine patch). He was unable to afford this medication and so stopped clonidine patch and did not start any new medication. Taking propranolol and losartan/hctz and amlodipine currently. BP has been running high since leaving hospital. He is with his wife and she helps to provide history. She feels he is still having a lot of weakness which we now know is from the stroke. She was also concerned by several old strokes noted on MRI. He has uncontrolled HTN and controlled DM. He denies headache or chest pains. No falls since being home. Is eating and drinking normally. No abdominal pain or symptoms.   PMH, [facility], social history reviewed and updated.   Review of Systems  Constitutional: Positive for activity change and fatigue.  HENT: Negative.   Eyes: Negative.   Respiratory: Negative for cough, chest tightness and shortness of breath.   Cardiovascular: Negative for chest pain, palpitations and leg swelling.  Gastrointestinal: Negative for abdominal distention, abdominal pain, constipation, diarrhea, nausea and vomiting.  Musculoskeletal: Positive for gait problem.  Skin: Negative.   Neurological: Positive for weakness.  Psychiatric/Behavioral: Negative.     Objective:  Physical Exam Constitutional:      Appearance: He is well-developed.  HENT:     Head: Normocephalic and atraumatic.  Neck:     Musculoskeletal: Normal range of motion.  Cardiovascular:     Rate and Rhythm: Normal rate and regular rhythm.  Pulmonary:     Effort: Pulmonary effort is normal. No respiratory distress.     Breath sounds: Normal breath sounds. No wheezing or rales.   Abdominal:     General: Bowel sounds are normal. There is no distension.     Palpations: Abdomen is soft.     Tenderness: There is no abdominal tenderness. There is no rebound.  Skin:    General: Skin is warm and dry.  Neurological:     Mental Status: He is alert and oriented to person, place, and time.     Coordination: Coordination normal.     Comments: Stable exam from previous with bilateral LE weakness  Psychiatric:     Comments: Does not engage much in the conversation during the visit     Vitals:   03/28/19 1125  BP: (!) 160/100  Pulse: 80  Temp: 98.7 F (37.1 C)  TempSrc: Oral  SpO2: 97%  Weight: 191 lb (86.6 kg)  Height: 5\' 9"  (1.753 m)    Assessment & Plan:

## 2019-04-09 ENCOUNTER — Ambulatory Visit: Payer: Self-pay | Admitting: *Deleted

## 2019-04-09 ENCOUNTER — Telehealth: Payer: Self-pay

## 2019-04-09 ENCOUNTER — Telehealth: Payer: Self-pay | Admitting: *Deleted

## 2019-04-09 NOTE — Telephone Encounter (Signed)
Wife to get papers needed from her office and bring to [place] office for dr crawford to complete

## 2019-04-09 NOTE — Telephone Encounter (Signed)
As stated in prior note if she needs to be out of work to care for him this would be FMLA and should be initiated through her work as there is specific paperwork which needs to be done for this.

## 2019-04-09 NOTE — Telephone Encounter (Signed)
I would strongly recommend to take hydralazine TID as this is prescribed for him and is for blood pressure.

## 2019-04-09 NOTE — Telephone Encounter (Signed)
Pt states she needs a note to stay at home and work from home to take care of her husband.  She states school starts back Monday, and she cannot leave him at home.  She states FMLA is not what she needs, requesting call back asap.

## 2019-04-09 NOTE — Telephone Encounter (Signed)
Pt called regarding her husband b/p readings this morning. She is on the [facility]. She stated that when checking his b/p and it was 183/112 and denying any neurological or cardiac symptoms. The next check was was 172/92.  He was started on new medications since leaving the hospital. When going over his list of medications, she stated he was taking everything as listed in the chart correctly but the Hydralazine 25 mg tab. He was taking that one only as needed if his b/p was over 180 and a different strength. Advised that according to the last office visit he should be taking the new prescription of 25 mg three times a day.  She also stated she had not been following a low sodium diet for him. And could be contributing to his hypertension. He has a hx of strokes. She is also advised to follow a low sodium diet and be mind for the symptoms of having a stroke, such as weakness, facial drooping, slurred speech. She voiced understanding. Per protocol, he should be seen regarding his  B/p. Will notify [facility] at [facility] for review and recommendation.  Reason for Disposition . Systolic BP  >= 334 OR Diastolic >= 356  Answer Assessment - Initial Assessment Questions 1. BLOOD PRESSURE: "What is the blood pressure?" "Did you take at least two measurements 5 minutes apart?"     183/112 and 172/92  2. ONSET: "When did you take your blood pressure?"     This morning 3. HOW: "How did you obtain the blood pressure?" (e.g., visiting nurse, automatic home BP monitor)     Automatic home BP monitor 4. HISTORY: "Do you have a history of high blood pressure?"     Yes 5. MEDICATIONS: "Are you taking any medications for blood pressure?" "Have you missed any doses recently?"     Yes and have not missed any doses 6. OTHER SYMPTOMS: "Do you have any symptoms?" (e.g., headache, chest pain, blurred vision, difficulty breathing, weakness)     no 7. PREGNANCY: "Is there any chance you are pregnant?" "When was your last menstrual  period?"     n/a  Protocols used: HIGH BLOOD PRESSURE-A-AH

## 2019-04-09 NOTE — Telephone Encounter (Signed)
Being taken care of in separate telephone encounter

## 2019-04-09 NOTE — Telephone Encounter (Signed)
Copied from [place] [id number]. Topic: General - Other >> Apr 06, 2019  2:05 PM Rainey Pines A wrote: Patients wife would like a callback in regards to work note that she requested a week ago.

## 2019-04-09 NOTE — Telephone Encounter (Addendum)
Advised patient's wife to have patient take hydralazine three times daily as dr [name] has advised----patient's wife repeated back for understanding--she will give 1 hydralazine pill  At 3 equal intervals daily---and continue monitoring reading----patient's wife to contact her work and get paperwork needed for her request to be out of work, paperwork needs to come to dr crawford to fill out---wife states she will get papers from her work and bring to our office---wife to call back if reading does not continue to lower

## 2019-04-09 NOTE — Telephone Encounter (Signed)
Do you know anything about a work note that the wife was requesting?

## 2019-04-09 NOTE — Telephone Encounter (Signed)
I'm not sure of this. We talked about FMLA for the wife but this is not a note this is something she would need to initiate at her work for them to send forms to [place].

## 2019-04-09 NOTE — Telephone Encounter (Signed)
Patient' wife, [name], calling to check the status of a work note. States that she cannot go back to work. States that his memory comes and goes and she is afraid to leave him alone with these issues. Would like a call back asap.

## 2019-04-09 NOTE — Telephone Encounter (Signed)
Pt's wife requesting a note if possible for job, to stay home with her husband until the end of November.  This is regarding her husband having been hospitalized and assisting with getting his medications correctly. She is requesting a call back please.

## 2019-04-10 ENCOUNTER — Ambulatory Visit (HOSPITAL_COMMUNITY)
Admission: RE | Admit: 2019-04-10 | Discharge: 2019-04-10 | Disposition: A | Discharge status: Home / Self Care | Payer: Medicare Other | Source: Ambulatory Visit | Attending: Cardiovascular Disease | Admitting: Cardiovascular Disease

## 2019-04-10 ENCOUNTER — Other Ambulatory Visit: Payer: Self-pay

## 2019-04-10 ENCOUNTER — Ambulatory Visit (HOSPITAL_BASED_OUTPATIENT_CLINIC_OR_DEPARTMENT_OTHER): Payer: Medicare Other

## 2019-04-10 DIAGNOSIS — I358 Other nonrheumatic aortic valve disorders: Secondary | ICD-10-CM | POA: Diagnosis not present

## 2019-04-10 DIAGNOSIS — I629 Nontraumatic intracranial hemorrhage, unspecified: Secondary | ICD-10-CM

## 2019-04-10 DIAGNOSIS — I77810 Thoracic aortic ectasia: Secondary | ICD-10-CM | POA: Insufficient documentation

## 2019-04-21 ENCOUNTER — Other Ambulatory Visit: Payer: Self-pay | Admitting: Internal Medicine

## 2019-04-26 ENCOUNTER — Other Ambulatory Visit: Payer: Self-pay

## 2019-04-26 ENCOUNTER — Ambulatory Visit (INDEPENDENT_AMBULATORY_CARE_PROVIDER_SITE_OTHER): Payer: Medicare Other | Admitting: Internal Medicine

## 2019-04-26 ENCOUNTER — Encounter: Payer: Self-pay | Admitting: Internal Medicine

## 2019-04-26 ENCOUNTER — Other Ambulatory Visit (INDEPENDENT_AMBULATORY_CARE_PROVIDER_SITE_OTHER): Payer: Medicare Other

## 2019-04-26 VITALS — BP 138/90 | HR 101 | Temp 98.7°F | Ht 69.0 in | Wt 188.0 lb

## 2019-04-26 DIAGNOSIS — I10 Essential (primary) hypertension: Secondary | ICD-10-CM | POA: Diagnosis not present

## 2019-04-26 LAB — CBC
HCT: 46.3 % (ref 39.0–52.0)
Hemoglobin: 15.7 g/dL (ref 13.0–17.0)
MCHC: 33.9 g/dL (ref 30.0–36.0)
MCV: 97.5 fl (ref 78.0–100.0)
Platelets: 251 10*3/uL (ref 150.0–400.0)
RBC: 4.75 Mil/uL (ref 4.22–5.81)
RDW: 15 % (ref 11.5–15.5)
WBC: 10.5 10*3/uL (ref 4.0–10.5)

## 2019-04-26 LAB — COMPREHENSIVE METABOLIC PANEL
ALT: 31 U/L (ref 0–53)
AST: 21 U/L (ref 0–37)
Albumin: 4.3 g/dL (ref 3.5–5.2)
Alkaline Phosphatase: 198 U/L — ABNORMAL HIGH (ref 39–117)
BUN: 20 mg/dL (ref 6–23)
CO2: 28 mEq/L (ref 19–32)
Calcium: 9.8 mg/dL (ref 8.4–10.5)
Chloride: 103 mEq/L (ref 96–112)
Creatinine, Ser: 1.35 mg/dL (ref 0.40–1.50)
GFR: 62.49 mL/min (ref >60.00)
Glucose, Bld: 99 mg/dL (ref 70–99)
Potassium: 3.7 mEq/L (ref 3.5–5.1)
Sodium: 141 mEq/L (ref 135–145)
Total Bilirubin: 0.4 mg/dL (ref 0.2–1.2)
Total Protein: 7.4 g/dL (ref 6.0–8.3)

## 2019-04-26 NOTE — Progress Notes (Signed)
   Subjective:   Patient ID: Drew Harrington, male    DOB: 03-23-1946, 73 y.o.   MRN: [id number]  HPI The patient is a 73 YO man coming in for follow up of blood pressure. With recent hemorrhagic stroke likely back in July. With uncontrolled blood pressure and recent changes made to his regimen. He is still having right leg weakness. We had changed him to hydralazine 25 mg TID from more expensive similar medication at last visit and continued him on losartan/hctz and amlodipine and propranolol and he has done this. BP is much more controlled at home. Denies dizziness or lightheadedness. No worsening stroke symptoms. Still having right leg weakness. Starting PT soon. 1 fall at home without injury.   Review of Systems  Constitutional: Negative.   HENT: Negative.   Eyes: Negative.   Respiratory: Negative for cough, chest tightness and shortness of breath.   Cardiovascular: Negative for chest pain, palpitations and leg swelling.  Gastrointestinal: Negative for abdominal distention, abdominal pain, constipation, diarrhea, nausea and vomiting.  Musculoskeletal: Negative.   Skin: Negative.   Neurological: Positive for weakness.  Psychiatric/Behavioral: Negative.     Objective:  Physical Exam Constitutional:      Appearance: He is well-developed.  HENT:     Head: Normocephalic and atraumatic.  Neck:     Musculoskeletal: Normal range of motion.  Cardiovascular:     Rate and Rhythm: Normal rate and regular rhythm.  Pulmonary:     Effort: Pulmonary effort is normal. No respiratory distress.     Breath sounds: Normal breath sounds. No wheezing or rales.  Abdominal:     General: Bowel sounds are normal. There is no distension.     Palpations: Abdomen is soft.     Tenderness: There is no abdominal tenderness. There is no rebound.  Skin:    General: Skin is warm and dry.  Neurological:     Mental Status: He is alert and oriented to person, place, and time. Mental status is at baseline.   Cranial Nerves: Cranial nerve deficit present.     Coordination: Coordination normal.     Vitals:   04/26/19 1526  BP: 138/90  Pulse: (!) 101  Temp: 98.7 F (37.1 C)  TempSrc: Oral  SpO2: 96%  Weight: 188 lb (85.3 kg)  Height: 5\' 9"  (1.753 m)    Assessment & Plan:

## 2019-04-26 NOTE — Patient Instructions (Signed)
Reduce the metformin to 1 pill daily instead of 1 pill twice a day.   We will see you back in 3 months to recheck the diabetes.

## 2019-04-27 ENCOUNTER — Telehealth: Payer: Self-pay | Admitting: Internal Medicine

## 2019-04-27 NOTE — Assessment & Plan Note (Signed)
BP at goal on hydralazine, losartan/hctz, propranolol and amlodipine. Checking CBC and CMP.

## 2019-04-27 NOTE — Telephone Encounter (Signed)
°  Relation to JS:RPRXYVO,PFYT:  spouse Call back number: [phone]   Reason for call:  Spouse faxing disability forms today to fax [phone], please note when received

## 2019-04-30 NOTE — Telephone Encounter (Signed)
Our fax machine has been messed up. I have not received anything yet.

## 2019-05-01 NOTE — Telephone Encounter (Signed)
Received fax and placed in MD folder to fill out and sign

## 2019-05-01 NOTE — Telephone Encounter (Signed)
Called patients wife and informed that we still have not received a fax for the FMLA will call them to see if they can fax it over again

## 2019-05-02 ENCOUNTER — Other Ambulatory Visit: Payer: Self-pay

## 2019-05-02 ENCOUNTER — Ambulatory Visit: Payer: Medicare Other | Attending: Internal Medicine

## 2019-05-02 VITALS — BP 158/82

## 2019-05-02 DIAGNOSIS — M6281 Muscle weakness (generalized): Secondary | ICD-10-CM | POA: Diagnosis not present

## 2019-05-02 DIAGNOSIS — R2689 Other abnormalities of gait and mobility: Secondary | ICD-10-CM

## 2019-05-02 NOTE — Patient Instructions (Signed)
Access Code: [email or web address]  URL: [email or web address]  Date: 05/02/2019  Prepared by: Cherly Anderson   Exercises Walking March - 4 reps - 1 sets - 1x daily - 7x weekly Single Leg Stance - 3 reps - 1 sets - 20 sec hold - 2x daily - 7x weekly

## 2019-05-03 ENCOUNTER — Telehealth: Payer: Self-pay

## 2019-05-03 NOTE — Telephone Encounter (Signed)
Copied from [place] [id number]. Topic: General - Inquiry >> May 03, 2019 10:04 AM Richardo Priest, NT wrote: Reason for CRM: Pt called in stating he went to rehab yesterday and was told he is doing better. Pt wants to know if PCP would sign off on him being able to drive again. Please advise.

## 2019-05-03 NOTE — Telephone Encounter (Signed)
Pt is requesting a call back from [place]: [phone]

## 2019-05-03 NOTE — Therapy (Signed)
[facility] [address] [place], [place], [address] Phone: [phone]   Fax:  [phone]  Physical Therapy Evaluation  Patient Details  Name: Drew Harrington MRN: [id number] Date of Birth: Oct 11, 1945 Referring Provider (PT): Eulogio Bear referred (sending cert to Pricilla Holm)   Encounter Date: 05/02/2019  PT End of Session - 05/02/19 1624    Visit Number  1    Number of Visits  9    Date for PT Re-Evaluation  07/01/19    Authorization Type  [employer] medicare 10th visit progress note    PT Start Time  1620    PT Stop Time  1705    PT Time Calculation (min)  45 min    Activity Tolerance  Patient tolerated treatment well    Behavior During Therapy  [facility] for tasks assessed/performed       Past Medical History:  Diagnosis Date  . Allergic rhinitis   . Benign neoplasm of colon   . Bladder cancer ([place])   . Borderline diabetes   . HTN (hypertension)   . Insomnia, unspecified   . Perirectal abscess 05/10/2013  . Personal history of colonic adenomas 04/10/2010  . Psychosexual dysfunction with inhibited sexual excitement   . Subacute intracranial hemorrhage ([place])   . Unspecified hemorrhoids without mention of complication     Past Surgical History:  Procedure Laterality Date  . CATARACT EXTRACTION W/ INTRAOCULAR LENS  IMPLANT, BILATERAL    . COLONOSCOPY    . removal cyst hip Left 05/23/2013  . TRANSTHORACIC ECHOCARDIOGRAM  04-06-2006   LVSF NORMAL/  EF 60%/ AORTIC ROOT AT UPPER LIMITS OF NORMAL SIZE  . TRANSURETHRAL RESECTION OF BLADDER TUMOR  10/08/2011   Procedure: CIRCUMCISION AND TRANSURETHRAL RESECTION OF BLADDER TUMOR (TURBT);  Surgeon: [name], MD;  Location: [facility];  Service: Urology;  Laterality: N/A;  . TRANSURETHRAL RESECTION OF BLADDER TUMOR  11/30/2011   Procedure: TRANSURETHRAL RESECTION OF BLADDER TUMOR (TURBT);  Surgeon: [name], MD;  Location:  [facility];  Service: Urology;  Laterality: N/A;    Vitals:   05/02/19 1646  BP: (!) 158/82     Subjective Assessment - 05/02/19 1624    Subjective  73 y/o male referred after hypertensive crisis. Hospitalized 9/24 to 9/26 due to this with subacute intracranial hemorrhage left thalamus and left basal ganglia left occipital infarct which are more chronic noted. Wife reports that she noticed him starting to drag right foot some earlier in summer. Pt reports he gets a little double vision when lays back. Wife also reports that he is having some trouble lifting right leg in to car.    Pertinent History  PMH: HTN, DM2, essential tremors    Patient Stated Goals  Pt wants to be able to walk better and drive again.    Currently in Pain?  No/denies   reports some pain in right groin when goes for longer walk        [facility] PT Assessment - 05/02/19 1631      Assessment   Medical Diagnosis  hypertensive crisis    Referring Provider (PT)  Eulogio Bear referred   sending cert to Pricilla Holm   Onset Date/Surgical Date  03/22/19    Hand Dominance  Right    Next MD Visit  05/09/19 with neurologist    Prior Therapy  no      Precautions   Precautions  None      Balance Screen  Has the patient fallen in the past 6 months  Yes    How many times?  1 slipped cleaning shower    Has the patient had a decrease in activity level because of a fear of falling?   No    Is the patient reluctant to leave their home because of a fear of falling?   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 Pt feels he is having some trouble getting words out like he would like      Observation/Other Assessments-Edema    Edema  --   none noted     Sensation   Light Touch  Appears Intact    Additional Comments  peripheral vision intact      Coordination   Gross Motor Movements are Fluid and Coordinated  Yes    Fine Motor Movements are Fluid and Coordinated  Yes   intact RAMs and finger opposition     ROM / Strength   AROM / PROM / Strength  Strength      Strength   Strength Assessment Site  Shoulder;Elbow;Hip;Knee;Ankle    Right/Left Shoulder  Right;Left    Right Shoulder Flexion  5/5    Left Shoulder Flexion  5/5    Right/Left Elbow  Right;Left    Right Elbow Flexion  5/5    Right Elbow Extension  5/5    Left Elbow Flexion  5/5    Left Elbow Extension  5/5    Right/Left Hip  Right;Left    Right Hip Flexion  4+/5    Right Hip ABduction  4/5    Left Hip Flexion  5/5    Left Hip ABduction  5/5    Right/Left Knee  Right;Left    Right Knee Flexion  5/5    Right Knee Extension  5/5    Left Knee Flexion  5/5    Left Knee Extension  5/5    Right/Left Ankle  Right;Left    Right Ankle Dorsiflexion  4+/5    Left Ankle Dorsiflexion  4+/5      Bed Mobility   Bed Mobility  Rolling Right;Rolling Left;Supine to Sit;Sit to Supine    Rolling Right  Independent    Rolling Left  Independent    Supine to Sit  Independent    Sit to Supine  Independent      Transfers   Transfers  Sit to Stand;Stand to Sit    Sit to  Stand   7: Independent    Stand to Sit  7: Independent      Ambulation/Gait   Ambulation/Gait  Yes    Ambulation/Gait Assistance  7: Independent    Ambulation Distance (Feet)  200 Feet    Assistive device  None    Gait Pattern  Step-through pattern    Ambulation Surface  Level;Indoor    Gait velocity  0.59m/s    Stairs  Yes    Stairs Assistance  6: Modified independent (Device/Increase time)    Stair Management Technique  One rail Right    Number of Stairs  4    Height of Stairs  6    Gait Comments  Pt has slight decreased right foot clearance scuffing foot       High Level Balance   High Level Balance Comments  SLS left=6 sec and right =5 sec      Functional Gait  Assessment   Gait assessed   Yes    Gait Level Surface  Walks 20 ft in less than 7 sec but greater than 5.5 sec, uses assistive device, slower speed, mild gait deviations, or deviates 6-10 in outside of the 12 in walkway width.    Change in Gait Speed  Able to smoothly change walking speed without loss of balance or gait deviation. Deviate no more than 6 in outside of the 12 in walkway width.    Gait with Horizontal Head Turns  Performs head turns smoothly with slight change in gait velocity (eg, minor disruption to smooth gait path), deviates 6-10 in outside 12 in walkway width, or uses an assistive device.    Gait with Vertical Head Turns  Performs task with slight change in gait velocity (eg, minor disruption to smooth gait path), deviates 6 - 10 in outside 12 in walkway width or uses assistive device    Gait and Pivot Turn  Pivot turns safely within 3 sec and stops quickly with no loss of balance.    Step Over Obstacle  Is able to step over 2 stacked shoe boxes taped together (9 in total height) without changing gait speed. No evidence of imbalance.    Gait with Narrow Base of Support  Is able to ambulate for 10 steps heel to toe with no staggering.    Gait with Eyes Closed  Walks 20 ft, uses assistive device, slower speed, mild  gait deviations, deviates 6-10 in outside 12 in walkway width. Ambulates 20 ft in less than 9 sec but greater than 7 sec.    Ambulating Backwards  Walks 20 ft, uses assistive device, slower speed, mild gait deviations, deviates 6-10 in outside 12 in walkway width.    Steps  Alternating feet, must use rail.    Total Score  24                Objective measurements completed on examination: See above findings.              PT Education - 05/02/19 1741    Education Details  Pt instructed in PT plan of care and initiated HEP    Person(s) Educated  Patient;Spouse    Methods  Explanation;Demonstration;Handout    Comprehension  Verbalized understanding       PT Short Term Goals - 05/03/19 0653      PT SHORT TERM GOAL #1   Title  Pt will be independent with initial strengthening and balance HEP to continue gains on own.    Time  4    Period  Weeks    Status  New    Target Date  06/02/19      PT SHORT TERM GOAL #2   Title  Pt will increase SLS to >10 sec bilateral for improved balance.    Baseline  05/02/19 right=5 and left=6    Time  4    Period  Weeks    Status  New    Target Date  06/02/19      PT SHORT TERM GOAL #3   Title  Pt will increase gait speed from 0.56m/s to >1.80m/s for improved community mobility.    Baseline  0.60m/s on 05/02/2019    Time  4    Period  Weeks    Status  New    Target Date  06/02/19        PT Long Term Goals - 05/03/19 0655      PT LONG TERM GOAL #1   Title  Pt will be independent with progressive HEP for strengthening and balance to maintain gains on own.    Time  8    Period  Weeks    Status  New    Target Date  07/01/19      PT LONG TERM GOAL #2   Title  Pt will improved FGA score form 24/30 to 26/30 or better for decreased fall risk.    Baseline  24/30 on 05/02/2019    Time  8    Period  Weeks    Status  New    Target Date  07/01/19      PT LONG TERM GOAL #3   Title  Pt will ambulate >500 on varied surfaces with  no evidence of decreased foot clearance on right for improved community ambulation.    Time  8    Period  Weeks    Status  New    Target Date  07/01/19             Plan - 05/03/19 6433    Clinical Impression Statement  73 y/o male referred after hypertensive crisis. Hospitalized 9/24 to 9/26 due to this with subacute intracranial hemorrhage left thalamus and left basal ganglia left occipital infarct which are more chronic noted.  Pt presents with slight decreased strength right hip compared to left with noted decreased right foot clearance with longer gaits. FGA score 24/30 indicating moderate fall risk. Will bnefit from skilled PT to address these deficits.    Personal Factors and Comorbidities  Comorbidity 3+    Comorbidities  HTN, DM2, essential tremors    Examination-Activity Limitations  Locomotion Level    Examination-Participation Restrictions  Other   work   Stability/Clinical Decision Making  Evolving/Moderate complexity    Clinical Decision Making  Moderate    Rehab Potential  Excellent    PT Frequency  1x / week    PT Duration  8 weeks   plus eval   PT Treatment/Interventions  ADLs/Self Care Home Management;Gait training;Stair training;Functional mobility training;Therapeutic activities;Patient/family education;Neuromuscular re-education;Balance training;Therapeutic exercise    PT Next Visit Plan  How is HEP going? Progress with high level balance/SLS activities, Gait on varied surfaces.    Consulted and Agree with Plan of Care  Patient;Family member/caregiver    Family Member Consulted  wife       Patient will benefit from skilled therapeutic intervention in order to improve the following deficits and impairments:  Abnormal gait, Decreased balance  Visit Diagnosis: Other abnormalities of gait and mobility  Muscle weakness (generalized)     Problem List Patient Active Problem List   Diagnosis Date Noted  . Hemorrhagic stroke ([place]) 03/28/2019  . Hypertensive  crisis 03/23/2019  . HTN (hypertension), malignant 03/23/2019  . Subacute intracranial hemorrhage ([place])   . Hypertensive urgency   . Right leg weakness 01/10/2019  . Right ankle swelling 07/12/2018  . Tremor, essential 02/27/2014  . Routine health maintenance 01/11/2013  . History of colonic polyps 04/10/2010  . 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 Hyperlipidemia associated with type 2 diabetes mellitus ([place]) 11/17/2007  . Diabetes mellitus type 2 with complications ([place]) 01/00/7121  . IMPOTENCE 11/16/2007  . Essential hypertension, malignant 11/16/2007  . HEMORRHOIDS 11/16/2007    Electa Sniff, PT, DPT, NCS 05/03/2019, 6:59 AM  [facility] [address] [place], [place], [address] Phone: [phone]   Fax:  [phone]  Name: Drew Harrington MRN: [id number] Date of Birth: [birth year]

## 2019-05-03 NOTE — Telephone Encounter (Signed)
Wife just wanted to know if we received the form. Informed her yes they are in MD's folder to look over. She stated understanding

## 2019-05-03 NOTE — Telephone Encounter (Signed)
Pt wife [name] stated she needs to speak with [place] regarding disability paperwork. Pt wife request call back. Cb# [phone]

## 2019-05-04 NOTE — Telephone Encounter (Signed)
These were placed in your box. I was not sure if you had called patient already let me know if you have and I can call back with the driving information

## 2019-05-04 NOTE — Telephone Encounter (Signed)
Forms filled out, with driving I would recommend that if the physical therapist thinks he is okay to drive he let us know. They have some special evaluations they can do to make sure reflexes are good enough and coordination is good enough with the right leg.

## 2019-05-07 DIAGNOSIS — Z0279 Encounter for issue of other medical certificate: Secondary | ICD-10-CM

## 2019-05-07 NOTE — Telephone Encounter (Signed)
Forms have been comppleted &Signed, Patient is out until 07/22/19 and if he needs longer he will let us know. Forms have been faxed to Neoma Laming [phone], Copy sent to scan &Charged for.   Patient informed and original mailed to patient.

## 2019-05-08 ENCOUNTER — Ambulatory Visit: Payer: Medicare Other | Admitting: Physical Therapy

## 2019-05-08 ENCOUNTER — Encounter: Payer: Self-pay | Admitting: Physical Therapy

## 2019-05-08 ENCOUNTER — Other Ambulatory Visit: Payer: Self-pay

## 2019-05-08 VITALS — BP 178/107 | HR 90

## 2019-05-08 NOTE — Patient Instructions (Signed)
Warning Signs of a Stroke  A stroke is a medical emergency and should be treated right away-every second counts. A stroke is caused by a decrease or block in blood flow to the brain. When this occurs, certain areas of the brain do not get enough oxygen, and brain cells begin to die. A stroke can lead to brain damage and can sometimes be life-threatening. However, if someone having a stroke gets medical treatment right away, he or she has better chances of surviving and recovering from the stroke. Being able to recognize the symptoms of a stroke is very important. Types of strokes There are two main types of strokes:  Ischemic strokes. This is the most common type of stroke. These strokes happen when a blood vessel that supplies blood to the brain is being blocked.  Hemorrhagic strokes. These strokes result from bleeding in the brain due to a blood vessel leaking or bursting (rupturing). A transient ischemic attack (TIA) is a "warning stroke" that causes stroke-like symptoms that go away quickly. Unlike a stroke, a TIA does not cause permanent damage to the brain. However, the symptoms of a TIA are the same as a stroke, and they also require medical treatment right away. Having a TIA is a sign that you are at higher risk for a permanent stroke. Warning signs of a stroke The symptoms of stroke may vary and will reflect the part of the brain that is involved. Symptoms usually happen suddenly. "BE FAST" is an easy way to remember the main warning signs of a stroke. B - Balance Signs are dizziness, sudden trouble walking, or loss of balance. E - Eyes Signs are trouble seeing or a sudden change in vision. F - Face Signs are sudden weakness or numbness of the face, or the face or eyelid drooping on one side. A - Arms Signs are weakness or numbness in an arm. This happens suddenly and usually on one side of the body. S - Speech Signs are sudden trouble speaking, slurred speech, or trouble understanding  what people say. T - Time Time to call emergency services. Write down what time symptoms started. Other signs of a stroke Some less common signs of a stroke include:  A sudden, severe headache with no known cause.  Nausea or vomiting.  Seizure. A stroke may be happening even if only one "BE FAST" symptoms is present. These symptoms may represent a serious problem that is an emergency. Do not wait to see if the symptoms will go away. Get medical help right away. Call your local emergency services (911 in the [place].). Do not drive yourself to the hospital. Summary  A stroke is a medical emergency and should be treated right away-every second counts.  "BE FAST" is an easy way to remember the main warning signs of a stroke.  Call local emergency services right away if you or someone else has any stroke symptoms, even if the symptoms go away.  Make note of what time the first symptoms appeared. Emergency responders or emergency room staff will need to know this information.  Do not wait to see if symptoms will go away. Call 911 even if only one of the "BE FAST" symptoms appears. This information is not intended to replace advice given to you by your health care provider. Make sure you discuss any questions you have with your health care provider. Document Released: 10/01/2016 Document Revised: 05/27/2017 Document Reviewed: 10/01/2016 Elsevier Patient Education  2020 Elsevier Inc.  

## 2019-05-09 NOTE — Therapy (Signed)
[facility] [address] [place], [place], [address] Phone: [phone]   Fax:  [phone]  Physical Therapy - Arrived No Charge   Patient Details  Name: Drew Harrington MRN: [id number] Date of Birth: 1946/05/18 Referring Provider (PT): Eulogio Bear referred (sending cert to Pricilla Holm)   Encounter Date: 05/08/2019  PT End of Session - 05/09/19 0741    Visit Number  1   arrived, no charge   Number of Visits  9    Date for PT Re-Evaluation  07/01/19    Authorization Type  [employer]HC medicare 10th visit progress note    Activity Tolerance  Treatment limited secondary to medical complications (Comment)   high BP, pt not taking his medication      Past Medical History:  Diagnosis Date  . Allergic rhinitis   . Benign neoplasm of colon   . Bladder cancer ([place])   . Borderline diabetes   . HTN (hypertension)   . Insomnia, unspecified   . Perirectal abscess 05/10/2013  . Personal history of colonic adenomas 04/10/2010  . Psychosexual dysfunction with inhibited sexual excitement   . Subacute intracranial hemorrhage ([place])   . Unspecified hemorrhoids without mention of complication     Past Surgical History:  Procedure Laterality Date  . CATARACT EXTRACTION W/ INTRAOCULAR LENS  IMPLANT, BILATERAL    . COLONOSCOPY    . removal cyst hip Left 05/23/2013  . TRANSTHORACIC ECHOCARDIOGRAM  04-06-2006   LVSF NORMAL/  EF 60%/ AORTIC ROOT AT UPPER LIMITS OF NORMAL SIZE  . TRANSURETHRAL RESECTION OF BLADDER TUMOR  10/08/2011   Procedure: CIRCUMCISION AND TRANSURETHRAL RESECTION OF BLADDER TUMOR (TURBT);  Surgeon: [name], MD;  Location: [facility];  Service: Urology;  Laterality: N/A;  . TRANSURETHRAL RESECTION OF BLADDER TUMOR  11/30/2011   Procedure: TRANSURETHRAL RESECTION OF BLADDER TUMOR (TURBT);  Surgeon: [name], MD;  Location: [facility];  Service: Urology;   Laterality: N/A;    Vitals:   05/08/19 1756 05/08/19 1759 05/08/19 1804  BP: (!) 185/121 (!) 180/115 (!) 178/107  Pulse: 91  90    Subjective Assessment - 05/08/19 1749    Subjective  No falls. No changes since last time. Has been doing his exercises at home.    Pertinent History  PMH: HTN, DM2, essential tremors    Patient Stated Goals  Pt wants to be able to walk better and drive again.    Currently in Pain?  No/denies         Patient arrived to PT session and BP taken at rest with automatic cuff at 185/121. Taken again manually at 180/115. Pt reporting he is asymptomatic - no signs/symptoms of a CVA. Pt reporting he did not take his 3rd does of his BP medication prior to coming to therapy. Wife drove him to appointment. Took BP again at 178/107. Educated pt on signs/symptoms of CVA and provided handout. Instructed pt to go home immediately and take his BP medication and have wife monitor his BP (have a cuff at home) and if it remains elevated >180/100 or pt is showing any signs/sx of CVA to go to the ED immediately. Pt verbalized understanding. Wheeled pt in manual w/c outside to his wife waiting in her car- went over the above instructions with her and wife also verbalized understanding.  PT Short Term Goals - 05/03/19 9629      PT SHORT TERM GOAL #1   Title  Pt will be independent with initial strengthening and balance HEP to continue gains on own.    Time  4    Period  Weeks    Status  New    Target Date  06/02/19      PT SHORT TERM GOAL #2   Title  Pt will increase SLS to >10 sec bilateral for improved balance.    Baseline  05/02/19 right=5 and left=6    Time  4    Period  Weeks    Status  New    Target Date  06/02/19      PT SHORT TERM GOAL #3   Title  Pt will increase gait speed from 0.37m/s to >1.67m/s for improved community mobility.    Baseline  0.30m/s on 05/02/2019    Time  4    Period  Weeks    Status  New    Target  Date  06/02/19        PT Long Term Goals - 05/03/19 0655      PT LONG TERM GOAL #1   Title  Pt will be independent with progressive HEP for strengthening and balance to maintain gains on own.    Time  8    Period  Weeks    Status  New    Target Date  07/01/19      PT LONG TERM GOAL #2   Title  Pt will improved FGA score form 24/30 to 26/30 or better for decreased fall risk.    Baseline  24/30 on 05/02/2019    Time  8    Period  Weeks    Status  New    Target Date  07/01/19      PT LONG TERM GOAL #3   Title  Pt will ambulate >500 on varied surfaces with no evidence of decreased foot clearance on right for improved community ambulation.    Time  8    Period  Weeks    Status  New    Target Date  07/01/19              Patient will benefit from skilled therapeutic intervention in order to improve the following deficits and impairments:     Visit Diagnosis: Muscle weakness (generalized)  Other abnormalities of gait and mobility     Problem List Patient Active Problem List   Diagnosis Date Noted  . Hemorrhagic stroke ([place]) 03/28/2019  . Hypertensive crisis 03/23/2019  . HTN (hypertension), malignant 03/23/2019  . Subacute intracranial hemorrhage ([place])   . Hypertensive urgency   . Right leg weakness 01/10/2019  . Right ankle swelling 07/12/2018  . Tremor, essential 02/27/2014  . Routine health maintenance 01/11/2013  . History of colonic polyps 04/10/2010  . 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 Hyperlipidemia associated with type 2 diabetes mellitus ([place]) 11/17/2007  . Diabetes mellitus type 2 with complications ([place]) 52/84/1324  . IMPOTENCE 11/16/2007  . Essential hypertension, malignant 11/16/2007  . HEMORRHOIDS 11/16/2007    Arliss Journey, PT, DPT  05/09/2019, 7:41 AM  [facility] [address] [place], [place], [address] Phone: [phone]   Fax:  [phone]  Name: Drew Harrington MRN: [id number] Date of Birth: 01-06-1946

## 2019-05-10 DIAGNOSIS — I639 Cerebral infarction, unspecified: Secondary | ICD-10-CM | POA: Diagnosis not present

## 2019-05-10 DIAGNOSIS — G250 Essential tremor: Secondary | ICD-10-CM | POA: Diagnosis not present

## 2019-05-12 DIAGNOSIS — I639 Cerebral infarction, unspecified: Secondary | ICD-10-CM | POA: Insufficient documentation

## 2019-05-15 ENCOUNTER — Ambulatory Visit: Payer: Medicare Other | Admitting: Physical Therapy

## 2019-05-18 ENCOUNTER — Ambulatory Visit: Payer: Medicare Other

## 2019-05-18 ENCOUNTER — Other Ambulatory Visit: Payer: Self-pay

## 2019-05-18 VITALS — BP 178/92

## 2019-05-18 DIAGNOSIS — R2689 Other abnormalities of gait and mobility: Secondary | ICD-10-CM

## 2019-05-18 DIAGNOSIS — M6281 Muscle weakness (generalized): Secondary | ICD-10-CM | POA: Diagnosis not present

## 2019-05-18 NOTE — Therapy (Signed)
[facility] [address] [place], [place], [address] Phone: [phone]   Fax:  [phone]  Physical Therapy Treatment  Patient Details  Name: Drew Harrington MRN: [id number] Date of Birth: March 01, 1946 Referring Provider (PT): Eulogio Bear referred (sending cert to Pricilla Holm)   Encounter Date: 05/18/2019  PT End of Session - 05/18/19 0839    Visit Number  2    Number of Visits  9    Date for PT Re-Evaluation  07/01/19    Authorization Type  [employer] medicare 10th visit progress note    PT Start Time  0839    PT Stop Time  0920    PT Time Calculation (min)  41 min    Activity Tolerance  Patient tolerated treatment well    Behavior During Therapy  [facility] for tasks assessed/performed       Past Medical History:  Diagnosis Date  . Allergic rhinitis   . Benign neoplasm of colon   . Bladder cancer ([place])   . Borderline diabetes   . HTN (hypertension)   . Insomnia, unspecified   . Perirectal abscess 05/10/2013  . Personal history of colonic adenomas 04/10/2010  . Psychosexual dysfunction with inhibited sexual excitement   . Subacute intracranial hemorrhage ([place])   . Unspecified hemorrhoids without mention of complication     Past Surgical History:  Procedure Laterality Date  . CATARACT EXTRACTION W/ INTRAOCULAR LENS  IMPLANT, BILATERAL    . COLONOSCOPY    . removal cyst hip Left 05/23/2013  . TRANSTHORACIC ECHOCARDIOGRAM  04-06-2006   LVSF NORMAL/  EF 60%/ AORTIC ROOT AT UPPER LIMITS OF NORMAL SIZE  . TRANSURETHRAL RESECTION OF BLADDER TUMOR  10/08/2011   Procedure: CIRCUMCISION AND TRANSURETHRAL RESECTION OF BLADDER TUMOR (TURBT);  Surgeon: [name], MD;  Location: [facility];  Service: Urology;  Laterality: N/A;  . TRANSURETHRAL RESECTION OF BLADDER TUMOR  11/30/2011   Procedure: TRANSURETHRAL RESECTION OF BLADDER TUMOR (TURBT);  Surgeon: [name], MD;  Location:  [facility];  Service: Urology;  Laterality: N/A;    Vitals:   05/18/19 0840  BP: (!) 178/92    Subjective Assessment - 05/18/19 0839    Subjective  Pt reports he did take BP meds this morning and checked and was doing well. Reports he is walking around the block every 2 days. Does get some aching in right groin after that lasts about 30 minutes. Has appointment with PCP again probably next week.    Pertinent History  PMH: HTN, DM2, essential tremors    Patient Stated Goals  Pt wants to be able to walk better and drive again.    Currently in Pain?  No/denies                       [facility] Adult PT Treatment/Exercise - 05/18/19 0842      Transfers   Transfers  Sit to Stand;Stand to Sit    Sit to Stand  7: Independent    Stand to Sit  7: Independent      Self-Care   Self-Care  Other Self-Care Comments    Other Self-Care Comments   Pt instructed to try to monitor BP more frequently and write down so has log to take to PCP. Currently taking in morning. Advised to take after he goes for walk and every evening as well. To continue to monitor if has any symptoms including headache or stroke symptoms and  seek medical help if occurs immediately.      Neuro Re-ed    Neuro Re-ed Details   Pt performed tandem gait without UE support 6' x 4, Marching gait 6' x 4 with occasional UE support, stepping over 4 cones with reciprocal pattern with light UE support in // bars first lap then x 2 laps without UE support, reciprocal steps over 4 cones with tapping cone first for increased SLS time. Needed occasional UE support for safety especially with right SLS. Side stepping over 4 cones x 2 laps in // bars with light UE support.  BP=180/78. Still elevated but no change from resting BP. After seated rest performed alternating toe taps on cones x 10 with occasional UE support, CGA for safety. SLS with other foot on soccerball x 10 sec x 3 each side then moving ball forward/back x 10  and side to side x 10 each foot with 1 UE fingertip support. Gait activities in // bars: walking on toes x 2 laps and walking on heels x  2 laps             PT Education - 05/18/19 0921    Education Details  Added to standing HEP    Person(s) Educated  Patient    Methods  Explanation    Comprehension  Verbalized understanding       PT Short Term Goals - 05/03/19 5400      PT SHORT TERM GOAL #1   Title  Pt will be independent with initial strengthening and balance HEP to continue gains on own.    Time  4    Period  Weeks    Status  New    Target Date  06/02/19      PT SHORT TERM GOAL #2   Title  Pt will increase SLS to >10 sec bilateral for improved balance.    Baseline  05/02/19 right=5 and left=6    Time  4    Period  Weeks    Status  New    Target Date  06/02/19      PT SHORT TERM GOAL #3   Title  Pt will increase gait speed from 0.65m/s to >1.30m/s for improved community mobility.    Baseline  0.61m/s on 05/02/2019    Time  4    Period  Weeks    Status  New    Target Date  06/02/19        PT Long Term Goals - 05/03/19 0655      PT LONG TERM GOAL #1   Title  Pt will be independent with progressive HEP for strengthening and balance to maintain gains on own.    Time  8    Period  Weeks    Status  New    Target Date  07/01/19      PT LONG TERM GOAL #2   Title  Pt will improved FGA score form 24/30 to 26/30 or better for decreased fall risk.    Baseline  24/30 on 05/02/2019    Time  8    Period  Weeks    Status  New    Target Date  07/01/19      PT LONG TERM GOAL #3   Title  Pt will ambulate >500 on varied surfaces with no evidence of decreased foot clearance on right for improved community ambulation.    Time  8    Period  Weeks    Status  New  Target Date  07/01/19            Plan - 05/18/19 4975    Clinical Impression Statement  Pt's BP continues to be elevated but not as high as last visit. Asymptomatic. No change during session and  witheld any exertional activity focusing just on balance with frequent breaks. Pt was challenged more with SLS on right.    Personal Factors and Comorbidities  Comorbidity 3+    Comorbidities  HTN, DM2, essential tremors    Examination-Activity Limitations  Locomotion Level    Examination-Participation Restrictions  Other   work   Stability/Clinical Decision Making  Evolving/Moderate complexity    Rehab Potential  Excellent    PT Frequency  1x / week    PT Duration  8 weeks   plus eval   PT Treatment/Interventions  ADLs/Self Care Home Management;Gait training;Stair training;Functional mobility training;Therapeutic activities;Patient/family education;Neuromuscular re-education;Balance training;Therapeutic exercise    PT Next Visit Plan  See if saw PCP yet as thought he had appointment next week to follow up on recent BP med changes. BP has still been running high. Has he been monitoring BP more often as recommended. High level balance, SLS activities. Gait on varied surfaces pending BP.    Consulted and Agree with Plan of Care  Patient;Family member/caregiver    Family Member Consulted  wife       Patient will benefit from skilled therapeutic intervention in order to improve the following deficits and impairments:  Abnormal gait, Decreased balance  Visit Diagnosis: Muscle weakness (generalized)  Other abnormalities of gait and mobility     Problem List Patient Active Problem List   Diagnosis Date Noted  . Hemorrhagic stroke ([place]) 03/28/2019  . Hypertensive crisis 03/23/2019  . HTN (hypertension), malignant 03/23/2019  . Subacute intracranial hemorrhage ([place])   . Hypertensive urgency   . Right leg weakness 01/10/2019  . Right ankle swelling 07/12/2018  . Tremor, essential 02/27/2014  . Routine health maintenance 01/11/2013  . History of colonic polyps 04/10/2010  . 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 Hyperlipidemia associated with type 2 diabetes mellitus ([place])  11/17/2007  . Diabetes mellitus type 2 with complications ([place]) 30/10/1100  . IMPOTENCE 11/16/2007  . Essential hypertension, malignant 11/16/2007  . HEMORRHOIDS 11/16/2007    Electa Sniff , PT, DPT, NCS 05/18/2019, 9:24 AM  [facility] [address] [place], [place], [address] Phone: [phone]   Fax:  [phone]  Name: JEN EPPINGER MRN: [id number] Date of Birth: 27-Sep-1945

## 2019-05-18 NOTE — Patient Instructions (Signed)
Access Code: [email or web address]  URL: [email or web address]  Date: 05/18/2019  Prepared by: Cherly Anderson   Exercises Walking March - 4 reps - 1 sets - 1x daily - 7x weekly Single Leg Stance - 3 reps - 1 sets - 20 sec hold - 2x daily - 7x weekly Tandem Walking with Counter Support - 4 reps - 1 sets - 2x daily - 7x weekly Heel Walking - 4 reps - 1 sets - 2x daily - 7x weekly Toe Walking - 4 reps - 1 sets - 2x daily - 7x weekly

## 2019-05-23 ENCOUNTER — Ambulatory Visit: Payer: Medicare Other | Admitting: Physical Therapy

## 2019-05-25 DIAGNOSIS — G250 Essential tremor: Secondary | ICD-10-CM | POA: Diagnosis not present

## 2019-05-29 ENCOUNTER — Ambulatory Visit: Payer: Medicare Other | Admitting: Physical Therapy

## 2019-06-01 ENCOUNTER — Other Ambulatory Visit: Payer: Self-pay

## 2019-06-01 ENCOUNTER — Ambulatory Visit: Payer: Medicare Other

## 2019-06-01 ENCOUNTER — Ambulatory Visit: Payer: Medicare Other | Attending: Internal Medicine | Admitting: Physical Therapy

## 2019-06-01 ENCOUNTER — Encounter: Payer: Self-pay | Admitting: Physical Therapy

## 2019-06-01 VITALS — BP 150/100

## 2019-06-01 DIAGNOSIS — R278 Other lack of coordination: Secondary | ICD-10-CM | POA: Insufficient documentation

## 2019-06-01 DIAGNOSIS — I69318 Other symptoms and signs involving cognitive functions following cerebral infarction: Secondary | ICD-10-CM | POA: Insufficient documentation

## 2019-06-01 DIAGNOSIS — M6281 Muscle weakness (generalized): Secondary | ICD-10-CM | POA: Insufficient documentation

## 2019-06-01 DIAGNOSIS — R2689 Other abnormalities of gait and mobility: Secondary | ICD-10-CM | POA: Insufficient documentation

## 2019-06-01 NOTE — Therapy (Signed)
[facility] [address] [place], [place], [address] Phone: [phone]   Fax:  [phone]  Physical Therapy Treatment  Patient Details  Name: Drew Harrington MRN: [id number] Date of Birth: 08-06-1945 Referring Provider (PT): Eulogio Bear referred (sending cert to Pricilla Holm)   Encounter Date: 06/01/2019  PT End of Session - 06/01/19 2240    Visit Number  3    Number of Visits  9    Date for PT Re-Evaluation  07/01/19    Authorization Type  [employer] medicare 10th visit progress note    PT Start Time  1015    PT Stop Time  1035   session ended early due to medical issues   PT Time Calculation (min)  20 min    Activity Tolerance  Patient tolerated treatment well    Behavior During Therapy  [facility] for tasks assessed/performed       Past Medical History:  Diagnosis Date  . Allergic rhinitis   . Benign neoplasm of colon   . Bladder cancer ([place])   . Borderline diabetes   . HTN (hypertension)   . Insomnia, unspecified   . Perirectal abscess 05/10/2013  . Personal history of colonic adenomas 04/10/2010  . Psychosexual dysfunction with inhibited sexual excitement   . Subacute intracranial hemorrhage ([place])   . Unspecified hemorrhoids without mention of complication     Past Surgical History:  Procedure Laterality Date  . CATARACT EXTRACTION W/ INTRAOCULAR LENS  IMPLANT, BILATERAL    . COLONOSCOPY    . removal cyst hip Left 05/23/2013  . TRANSTHORACIC ECHOCARDIOGRAM  04-06-2006   LVSF NORMAL/  EF 60%/ AORTIC ROOT AT UPPER LIMITS OF NORMAL SIZE  . TRANSURETHRAL RESECTION OF BLADDER TUMOR  10/08/2011   Procedure: CIRCUMCISION AND TRANSURETHRAL RESECTION OF BLADDER TUMOR (TURBT);  Surgeon: [name], MD;  Location: [facility];  Service: Urology;  Laterality: N/A;  . TRANSURETHRAL RESECTION OF BLADDER TUMOR  11/30/2011   Procedure: TRANSURETHRAL RESECTION OF BLADDER TUMOR (TURBT);  Surgeon:  [name], MD;  Location: [facility];  Service: Urology;  Laterality: N/A;    Vitals:   06/01/19 1022 06/01/19 1025  BP: (!) 159/107 (!) 150/100    Subjective Assessment - 06/01/19 1019    Subjective  No new falls. Did see primary who did not make any changes. Spouse reports having trouble getting in the car due to right leg weakness.    Pertinent History  PMH: HTN, DM2, essential tremors           [facility] Adult PT Treatment/Exercise - 06/01/19 2204      Transfers   Transfers  Sit to Stand;Stand to Sit    Sit to Stand  7: Independent    Stand to Sit  7: Independent      Ambulation/Gait   Ambulation/Gait  Yes    Ambulation/Gait Assistance  5: Supervision    Ambulation/Gait Assistance Details  pt noted to shuffle feet at times with decreased stride length and decreased stance time on right side with gait.    Ambulation Distance (Feet)  --   around gym with session   Assistive device  None    Gait Pattern  Step-through pattern;Decreased stride length;Decreased stance time - right;Decreased step length - left;Decreased weight shift to right    Ambulation Surface  Level;Indoor      Self-Care   Self-Care  Other Self-Care Comments    Other Self-Care Comments   Pt continues  to have elevated blood pressure. Discussed risks of having another CVA due to elevated BP with pt and spouse. Advised pt to follow up with MD regarding elevated resting BP. Pt and spouse both verbalized understanding and plan to call MD. Pt's spouse also with reports of pt having issues with getting his right LE into the car and being tired all the time. Discussed stroke related fatigue and how to pace activities during the day. Also discussed use of blue leg lifter to assist with getting pt's weak leg into/out of the car. Pt able to demo use in session today. Provided pt and spouse with ordering options for if they decide on getting one for home use.                               PT  Short Term Goals - 06/01/19 2242      PT SHORT TERM GOAL #1   Title  Pt will be independent with initial strengthening and balance HEP to continue gains on own.    Baseline  06/01/19: met today with current program    Status  Achieved    Target Date  06/02/19      PT SHORT TERM GOAL #2   Title  Pt will increase SLS to >10 sec bilateral for improved balance.    Baseline  05/02/19 right=5 and left=6    Time  4    Period  Weeks    Status  On-going    Target Date  06/02/19      PT SHORT TERM GOAL #3   Title  Pt will increase gait speed from 0.48ms to >1.111m for improved community mobility.    Baseline  0.9258mon 05/02/2019    Time  4    Period  Weeks    Status  On-going    Target Date  06/02/19        PT Long Term Goals - 05/03/19 0655      PT LONG TERM GOAL #1   Title  Pt will be independent with progressive HEP for strengthening and balance to maintain gains on own.    Time  8    Period  Weeks    Status  New    Target Date  07/01/19      PT LONG TERM GOAL #2   Title  Pt will improved FGA score form 24/30 to 26/30 or better for decreased fall risk.    Baseline  24/30 on 05/02/2019    Time  8    Period  Weeks    Status  New    Target Date  07/01/19      PT LONG TERM GOAL #3   Title  Pt will ambulate >500 on varied surfaces with no evidence of decreased foot clearance on right for improved community ambulation.    Time  8    Period  Weeks    Status  New    Target Date  07/01/19            Plan - 06/01/19 2242    Clinical Impression Statement  Today's skilled session was limited by elevated BP readings. Pt and spouse advised to follow up with his primary MD about his continued elevated BP readings. Both agreed to call and set up an appt with the MD. Also addressed pt's STG related to HEP with goal met. Also addressed spouses concerns for pt's fatigue and limitations with getting  into/out of car.    Personal Factors and Comorbidities  Comorbidity 3+     Comorbidities  HTN, DM2, essential tremors    Examination-Activity Limitations  Locomotion Level    Examination-Participation Restrictions  Other   work   Stability/Clinical Decision Making  Evolving/Moderate complexity    Rehab Potential  Excellent    PT Frequency  1x / week    PT Duration  8 weeks   plus eval   PT Treatment/Interventions  ADLs/Self Care Home Management;Gait training;Stair training;Functional mobility training;Therapeutic activities;Patient/family education;Neuromuscular re-education;Balance training;Therapeutic exercise    PT Next Visit Plan  did he follow up again with PCP?; monitor BP readings; check STGs when BP allows pt to participate with therapy    Consulted and Agree with Plan of Care  Patient;Family member/caregiver    Family Member Consulted  wife       Patient will benefit from skilled therapeutic intervention in order to improve the following deficits and impairments:  Abnormal gait, Decreased balance  Visit Diagnosis: Muscle weakness (generalized)  Other abnormalities of gait and mobility     Problem List Patient Active Problem List   Diagnosis Date Noted  . Hemorrhagic stroke ([place]) 03/28/2019  . Hypertensive crisis 03/23/2019  . HTN (hypertension), malignant 03/23/2019  . Subacute intracranial hemorrhage ([place])   . Hypertensive urgency   . Right leg weakness 01/10/2019  . Right ankle swelling 07/12/2018  . Tremor, essential 02/27/2014  . Routine health maintenance 01/11/2013  . History of colonic polyps 04/10/2010  . 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 Hyperlipidemia associated with type 2 diabetes mellitus ([place]) 11/17/2007  . Diabetes mellitus type 2 with complications ([place]) 21/04/5519  . IMPOTENCE 11/16/2007  . Essential hypertension, malignant 11/16/2007  . HEMORRHOIDS 11/16/2007    Willow Ora, PTA, [facility] [address] [place], [place] [address] [phone] 06/01/19, 11:00 PM   Name:  Drew Harrington MRN: [id number] Date of Birth: 1945-12-03

## 2019-06-04 ENCOUNTER — Telehealth: Payer: Self-pay

## 2019-06-04 ENCOUNTER — Ambulatory Visit: Payer: Medicare Other | Admitting: Physical Therapy

## 2019-06-04 NOTE — Telephone Encounter (Signed)
States his BP has been running 150/100 states that he is not having any symptoms.  Rehab did not give what BP needed to be Patient states rehab is to be getting in contact with [place] either by phone or fax to get what they need patient states he was just letting us know to look out for it.

## 2019-06-04 NOTE — Telephone Encounter (Signed)
Copied from [place] [id number]. Topic: General - Other >> Jun 04, 2019  9:47 AM [name] A wrote: Reason for CRM: Patient called to say that he is supposed to be doing Rehab but his BP is not stable so they do not want to do the rehab without permission from Dr [name]. Per patient rehab is waiting to hear from Dr [name] on what they should do. Please call patient for further info at  Ph# [phone]

## 2019-06-04 NOTE — Telephone Encounter (Signed)
I'm not sure this is enough information to make an assessment. What is BP running? Do they want parameters for treatment?

## 2019-06-05 ENCOUNTER — Ambulatory Visit: Payer: Medicare Other | Admitting: Physical Therapy

## 2019-06-05 ENCOUNTER — Other Ambulatory Visit: Payer: Self-pay

## 2019-06-05 ENCOUNTER — Encounter: Payer: Self-pay | Admitting: Physical Therapy

## 2019-06-05 VITALS — BP 160/106 | HR 92

## 2019-06-05 DIAGNOSIS — R2689 Other abnormalities of gait and mobility: Secondary | ICD-10-CM

## 2019-06-05 DIAGNOSIS — M6281 Muscle weakness (generalized): Secondary | ICD-10-CM

## 2019-06-05 NOTE — Therapy (Signed)
[facility] [address] [place], [place], [address] Phone: [phone]   Fax:  [phone]  Physical Therapy Treatment  Patient Details  Name: Drew Harrington MRN: [id number] Date of Birth: 07/27/1945 Referring Provider (PT): Eulogio Bear referred (sending cert to Pricilla Holm)   Encounter Date: 06/05/2019  PT End of Session - 06/05/19 1035    Visit Number  3   no change due to cancell due to elevated BP.   Number of Visits  9    Date for PT Re-Evaluation  07/01/19    Authorization Type  [employer] medicare 10th visit progress note    PT Start Time  1015    PT Stop Time  1030    PT Time Calculation (min)  15 min    Activity Tolerance  Treatment limited secondary to medical complications (Comment)   elevated BP, session ended.   Behavior During Therapy  [facility] for tasks assessed/performed       Past Medical History:  Diagnosis Date  . Allergic rhinitis   . Benign neoplasm of colon   . Bladder cancer ([place])   . Borderline diabetes   . HTN (hypertension)   . Insomnia, unspecified   . Perirectal abscess 05/10/2013  . Personal history of colonic adenomas 04/10/2010  . Psychosexual dysfunction with inhibited sexual excitement   . Subacute intracranial hemorrhage ([place])   . Unspecified hemorrhoids without mention of complication     Past Surgical History:  Procedure Laterality Date  . CATARACT EXTRACTION W/ INTRAOCULAR LENS  IMPLANT, BILATERAL    . COLONOSCOPY    . removal cyst hip Left 05/23/2013  . TRANSTHORACIC ECHOCARDIOGRAM  04-06-2006   LVSF NORMAL/  EF 60%/ AORTIC ROOT AT UPPER LIMITS OF NORMAL SIZE  . TRANSURETHRAL RESECTION OF BLADDER TUMOR  10/08/2011   Procedure: CIRCUMCISION AND TRANSURETHRAL RESECTION OF BLADDER TUMOR (TURBT);  Surgeon: [name], MD;  [facility];  Service: Urology;  Laterality: N/A;  . TRANSURETHRAL RESECTION OF BLADDER TUMOR  11/30/2011   Procedure: TRANSURETHRAL RESECTION OF BLADDER TUMOR (TURBT);  Surgeon: [name], MD;  Location: [facility];  Service: Urology;  Laterality: N/A;    Vitals:   06/05/19 1019 06/05/19 1023  BP: (!) 164/109 (!) 160/106  Pulse: 92     Subjective Assessment - 06/05/19 1017    Subjective  No falls. Does have some right UE pain with raising arm up. Reports his BP was elevated at home this morning.    Pertinent History  PMH: HTN, DM2, essential tremors    Patient Stated Goals  Pt wants to be able to walk better and drive again.    Currently in Pain?  No/denies              PT Short Term Goals - 06/01/19 2242      PT SHORT TERM GOAL #1   Title  Pt will be independent with initial strengthening and balance HEP to continue gains on own.    Baseline  06/01/19: met today with current program    Status  Achieved    Target Date  06/02/19      PT SHORT TERM GOAL #2   Title  Pt will increase SLS to >10 sec bilateral for improved balance.    Baseline  05/02/19 right=5 and left=6    Time  4    Period  Weeks    Status  On-going    Target Date  06/02/19  PT SHORT TERM GOAL #3   Title  Pt will increase gait speed from 0.56ms to >1.156m for improved community mobility.    Baseline  0.9256mon 05/02/2019    Time  4    Period  Weeks    Status  On-going    Target Date  06/02/19        PT Long Term Goals - 05/03/19 0655      PT LONG TERM GOAL #1   Title  Pt will be independent with progressive HEP for strengthening and balance to maintain gains on own.    Time  8    Period  Weeks    Status  New    Target Date  07/01/19      PT LONG TERM GOAL #2   Title  Pt will improved FGA score form 24/30 to 26/30 or better for decreased fall risk.    Baseline  24/30 on 05/02/2019    Time  8    Period  Weeks    Status  New    Target Date  07/01/19      PT LONG TERM GOAL #3   Title  Pt will ambulate >500 on varied surfaces with no evidence of decreased foot  clearance on right for improved community ambulation.    Time  8    Period  Weeks    Status  New    Target Date  07/01/19            Plan - 06/05/19 1037    Clinical Impression Statement  Pt continues to have elevated BP readings. Primary PT to work with referring MD on obtaining safe parameters or improved BP control to continue with PT.    Personal Factors and Comorbidities  Comorbidity 3+    Comorbidities  HTN, DM2, essential tremors    Examination-Activity Limitations  Locomotion Level    Examination-Participation Restrictions  Other   work   Stability/Clinical Decision Making  Evolving/Moderate complexity    Rehab Potential  Excellent    PT Frequency  1x / week    PT Duration  8 weeks   plus eval   PT Treatment/Interventions  ADLs/Self Care Home Management;Gait training;Stair training;Functional mobility training;Therapeutic activities;Patient/family education;Neuromuscular re-education;Balance training;Therapeutic exercise    PT Next Visit Plan  did he follow up again with PCP?; monitor BP readings; check STGs when BP allows pt to participate with therapy    Consulted and Agree with Plan of Care  Patient;Family member/caregiver    Family Member Consulted  wife       Patient will benefit from skilled therapeutic intervention in order to improve the following deficits and impairments:  Abnormal gait, Decreased balance  Visit Diagnosis: Muscle weakness (generalized)  Other abnormalities of gait and mobility     Problem List Patient Active Problem List   Diagnosis Date Noted  . Hemorrhagic stroke ([place]CC[place]9/30/2020  . Hypertensive crisis 03/23/2019  . HTN (hypertension), malignant 03/23/2019  . Subacute intracranial hemorrhage ([facility] . Hypertensive urgency   . Right leg weakness 01/10/2019  . Right ankle swelling 07/12/2018  . Tremor, essential 02/27/2014  . Routine health maintenance 01/11/2013  . History of colonic polyps 04/10/2010  . CATARACT, SENILE,  BILATERAL 06/24/2009  . Hyperlipidemia associated with type 2 diabetes mellitus (HCC[place]es5/22/2009  . Diabetes mellitus type 2 with complications ([place]CC[place]k[facility]5/79/15/0569 IMPOTENCE 11/16/2007  . Essential hypertension, malignant 11/16/2007  . HEMORRHOIDS 11/16/2007    KatWillow OraTA, CLT [facility] [address], [place], [place] [address] [phone] 06/05/19, 10:42 AM   Name: EDDY LISZEWSKI MRN: [id number] Date of Birth: [birth year]

## 2019-06-07 NOTE — Telephone Encounter (Signed)
Patient informed of MD response and stated understanding  

## 2019-06-07 NOTE — Telephone Encounter (Signed)
I have communicated with PT so he should be able to get treatment again there with new BP goals for him.

## 2019-06-07 NOTE — Telephone Encounter (Signed)
Patient called in stating he is still waiting to hear back on status of what to do for PT. Pt states he feels like he is going backwards and is wondering if it would be possible to resume PT but at his house. Please advise.

## 2019-06-07 NOTE — Telephone Encounter (Signed)
Can patient get PT to come to his house

## 2019-06-11 ENCOUNTER — Ambulatory Visit: Payer: Medicare Other | Admitting: Physical Therapy

## 2019-06-12 ENCOUNTER — Ambulatory Visit: Payer: Medicare Other

## 2019-06-12 ENCOUNTER — Ambulatory Visit: Payer: Medicare Other | Admitting: Occupational Therapy

## 2019-06-12 ENCOUNTER — Telehealth: Payer: Self-pay

## 2019-06-12 ENCOUNTER — Other Ambulatory Visit: Payer: Self-pay

## 2019-06-12 VITALS — BP 172/98

## 2019-06-12 DIAGNOSIS — R278 Other lack of coordination: Secondary | ICD-10-CM

## 2019-06-12 DIAGNOSIS — I69318 Other symptoms and signs involving cognitive functions following cerebral infarction: Secondary | ICD-10-CM

## 2019-06-12 DIAGNOSIS — M6281 Muscle weakness (generalized): Secondary | ICD-10-CM

## 2019-06-12 DIAGNOSIS — R2689 Other abnormalities of gait and mobility: Secondary | ICD-10-CM

## 2019-06-12 NOTE — Telephone Encounter (Signed)
Dr. [name], Pt's BP continues to be high which limits his ability to participate in PT due to safety concerns especially with recent CVA and hypertensive crisis. Pt has been taking current meds as prescribed. Denies any headaches. I did do some low level activities with walking and balance in session today to see if any change in BP. Here are the last few sessions readings: 11/20 178/92 06/01/19 150/100 06/05/19 160/106 06/12/19 172/98 to 176/100 with activity. Pt reports that his morning BP just after meds this morning was 170/91. I'm concerned with his CVA history that his BP is too high and really not comfortable with a diastolic above 716 for any activity. Do any changes to meds need to be made? Thanks so much for your help. Cherly Anderson, PT, DPT, NCS

## 2019-06-12 NOTE — Telephone Encounter (Signed)
BP was normal at recent visit so I would be hesitant to make changes to medications. You can recommend for patient to call office for BP check at the office to see if BP is running high and if changes are needed.

## 2019-06-12 NOTE — Therapy (Signed)
[facility] [address] [place], [place], [address] Phone: [phone]   Fax:  [phone]  Occupational Therapy Evaluation  Patient Details  Name: Drew Harrington MRN: [id number] Date of Birth: Dec 31, 1945 No data recorded  Encounter Date: 06/12/2019  OT End of Session - 06/12/19 1149    Visit Number  1    Number of Visits  5    Date for OT Re-Evaluation  07/18/19    Authorization Type  UHC MCR    OT Start Time  1100    OT Stop Time  1145    OT Time Calculation (min)  45 min    Activity Tolerance  Patient tolerated treatment well    Behavior During Therapy  [facility] for tasks assessed/performed       Past Medical History:  Diagnosis Date  . Allergic rhinitis   . Benign neoplasm of colon   . Bladder cancer ([place])   . Borderline diabetes   . HTN (hypertension)   . Insomnia, unspecified   . Perirectal abscess 05/10/2013  . Personal history of colonic adenomas 04/10/2010  . Psychosexual dysfunction with inhibited sexual excitement   . Subacute intracranial hemorrhage ([place])   . Unspecified hemorrhoids without mention of complication     Past Surgical History:  Procedure Laterality Date  . CATARACT EXTRACTION W/ INTRAOCULAR LENS  IMPLANT, BILATERAL    . COLONOSCOPY    . removal cyst hip Left 05/23/2013  . TRANSTHORACIC ECHOCARDIOGRAM  04-06-2006   LVSF NORMAL/  EF 60%/ AORTIC ROOT AT UPPER LIMITS OF NORMAL SIZE  . TRANSURETHRAL RESECTION OF BLADDER TUMOR  10/08/2011   Procedure: CIRCUMCISION AND TRANSURETHRAL RESECTION OF BLADDER TUMOR (TURBT);  Surgeon: [name], MD;  Location: [facility];  Service: Urology;  Laterality: N/A;  . TRANSURETHRAL RESECTION OF BLADDER TUMOR  11/30/2011   Procedure: TRANSURETHRAL RESECTION OF BLADDER TUMOR (TURBT);  Surgeon: [name], MD;  Location: [facility];  Service: Urology;  Laterality: N/A;    There were no vitals  filed for this visit.  Subjective Assessment - 06/12/19 1109    Pertinent History  CVA 03/22/19. PMH: essential tremor, DM2, HTN    Limitations  Monitor BP    Patient Stated Goals  I'd like to return to part time work if possible and driving    Currently in Pain?  No/denies        [facility] OT Assessment - 06/12/19 0001      Assessment   Medical Diagnosis  hypertensive crisis w/ CVA    Lt thalamus   Onset Date/Surgical Date  03/22/19    Hand Dominance  Right    Next MD Visit  05/09/19 with neurologist    Prior Therapy  no      Precautions   Precautions  None    Precaution Comments  monitor BP      Balance Screen   Has the patient fallen in the past 6 months  --   SEE P.T. eval     Home  Environment   Bathroom Astronomer    Additional Comments  Pt lives in split level home, 5 steps to enter from outside.     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  Leisure  work with plaster, spend time with family      ADL   Eating/Feeding  Independent    Grooming  Independent    Upper Body Bathing  Modified independent    Lower Body Bathing  Modified independent    Upper Body Dressing  Independent    Lower Body Dressing  Independent    Banker - [place]      IADL   Shopping  Needs to be accompanied on any shopping trip    Light Housekeeping  Does personal laundry completely;Performs light daily tasks such as dishwashing, bed making;Maintains house alone or with occasional assistance    Meal Prep  Able to complete simple cold meal and snack prep;Able to complete simple warm meal prep   wife always cooked   Community Mobility  Relies on family or friends for transportation    Medication Management  Is  responsible for taking medication in correct dosages at correct time   w/ use of pill box     Mobility   Mobility Status  Independent      Written Expression   Dominant Hand  Right      Vision - History   Visual History  Cataracts   both eyes   Additional Comments  no changes since stroke per pt report      Cognition   Area of Impairment  Memory    Memory  Decreased short-term memory    Memory Comments  wife reports that she has to repeat things at times. Pt feels he is having some trouble getting words out like he would like      Posture/Postural Control   Posture/Postural Control  No significant limitations      Sensation   Light Touch  Appears Intact      Coordination   9 Hole Peg Test  Right;Left    Right 9 Hole Peg Test  55.25 sec    Left 9 Hole Peg Test  46.97 sec      Edema   Edema  mild Rt hand      Tone   Assessment Location  Right Upper Extremity      ROM / Strength   AROM / PROM / Strength  AROM;Strength      AROM   Overall AROM Comments  BUE AROM WFL's w/ bilateral sh flexion limitations at end range. Pt does report some soreness RUE w/ flexion      Strength   Overall Strength Comments  MMT grossly 5/5 BUE's      Hand Function   Right Hand Grip (lbs)  50 lbs    Left Hand Grip (lbs)  59 lbs      RUE Tone   RUE Tone  Mild;Hypertonic   at elbow                          OT Long Term Goals - 06/12/19 1203      OT LONG TERM GOAL #1   Title  Independent with coordination HEP and putty HEP    Time  4    Period  Weeks    Status  New      OT LONG TERM GOAL #2   Title  Pt to verbalize understanding with compensatory strategies and possible A/E to help manage essential tremor w/ writing and fine motor tasks  Time  4    Period  Weeks    Status  New      OT LONG TERM GOAL #3   Title  Pt to be independent with BUE HEP for high level shoulder ROM    Time  4    Period  Weeks    Status  New      OT LONG TERM GOAL #4   Title   Pt to perform environmental scanning w/ simple physical task w/ 90% accuracy    Time  4    Period  Weeks      OT LONG TERM GOAL #5   Title  Pt to simulate work related tasks with independence    Time  4    Period  Weeks    Status  New            Plan - 06/12/19 1158    Clinical Impression Statement  Pt is a 73 y.o. male who presents to outpatient O..T for evaluation following CVA from hypertensive crisis 03/22/19 and essential tremor (premorbid). Pt w/ mild hypertonicity RUE at elbow, decreased coordination (bilaterally but slightly worse on Rt), bilateral end range sh flexion tightness. Pt would benefit from short stent of O.T. to address these deficits and prepare for potential return to part time work.    Occupational performance deficits (Please refer to evaluation for details):  IADL's;Work    Body Structure / Function / Physical Skills  ROM;IADL;Strength;Coordination;FMC;Tone;UE functional use;Endurance    Cognitive Skills  Memory    Rehab Potential  Good    Clinical Decision Making  Limited treatment options, no task modification necessary    Comorbidities Affecting Occupational Performance:  Presence of comorbidities impacting occupational performance    Comorbidities impacting occupational performance description:  essential tremor, HTN    Modification or Assistance to Complete Evaluation   No modification of tasks or assist necessary to complete eval    OT Frequency  1x / week    OT Duration  4 weeks   Plus eval   OT Treatment/Interventions  Self-care/ADL training;Therapeutic exercise;Functional Mobility Training;Aquatic Therapy;Neuromuscular education;Manual Therapy;Therapeutic activities;DME and/or AE instruction;Cognitive remediation/compensation;Visual/perceptual remediation/compensation;Passive range of motion;Patient/family education    Plan  coordination and putty HEP, simulate work activities (using coban)    Consulted and Agree with Plan of Care  Patient        Patient will benefit from skilled therapeutic intervention in order to improve the following deficits and impairments:   Body Structure / Function / Physical Skills: ROM, IADL, Strength, Coordination, FMC, Tone, UE functional use, Endurance Cognitive Skills: Memory     Visit Diagnosis: Other lack of coordination  Muscle weakness (generalized)  Other symptoms and signs involving cognitive functions following cerebral infarction    Problem List Patient Active Problem List   Diagnosis Date Noted  . 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 Hypertensive crisis 03/23/2019  . HTN (hypertension), malignant 03/23/2019  . Subacute intracranial hemorrhage ([place])   . Hypertensive urgency   . Right leg weakness 01/10/2019  . Right ankle swelling 07/12/2018  . Tremor, essential 02/27/2014  . Routine health maintenance 01/11/2013  . History of colonic polyps 04/10/2010  . 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 Hyperlipidemia associated with type 2 diabetes mellitus ([place]) 11/17/2007  . Diabetes mellitus type 2 with complications ([place]) 32/95/1884  . IMPOTENCE 11/16/2007  . Essential hypertension, malignant 11/16/2007  . HEMORRHOIDS 11/16/2007    Carey Bullocks, OTR/L 06/12/2019, 12:08 PM  [facility] [phone]  [place], [place], [address] Phone: [phone]   Fax:  [phone]  Name: CAROLE DEERE MRN: [id number] Date of Birth: Aug 03, 1945

## 2019-06-12 NOTE — Therapy (Signed)
[facility] [address] [place], [place], [address] Phone: [phone]   Fax:  [phone]  Physical Therapy Treatment  Patient Details  Name: Drew Harrington MRN: [id number] Date of Birth: 01/14/1946 Referring Provider (PT): Eulogio Bear referred (sending cert to Pricilla Holm)   Encounter Date: 06/12/2019  PT End of Session - 06/12/19 1021    Visit Number  4    Number of Visits  9    Date for PT Re-Evaluation  07/01/19    Authorization Type  UHC medicare 10th visit progress note    PT Start Time  1019    PT Stop Time  1059    PT Time Calculation (min)  40 min    Activity Tolerance  Treatment limited secondary to medical complications (Comment)   elevated BP, session ended.   Behavior During Therapy  [facility] for tasks assessed/performed       Past Medical History:  Diagnosis Date  . Allergic rhinitis   . Benign neoplasm of colon   . Bladder cancer ([place])   . Borderline diabetes   . HTN (hypertension)   . Insomnia, unspecified   . Perirectal abscess 05/10/2013  . Personal history of colonic adenomas 04/10/2010  . Psychosexual dysfunction with inhibited sexual excitement   . Subacute intracranial hemorrhage ([place])   . Unspecified hemorrhoids without mention of complication     Past Surgical History:  Procedure Laterality Date  . CATARACT EXTRACTION W/ INTRAOCULAR LENS  IMPLANT, BILATERAL    . COLONOSCOPY    . removal cyst hip Left 05/23/2013  . TRANSTHORACIC ECHOCARDIOGRAM  04-06-2006   LVSF NORMAL/  EF 60%/ AORTIC ROOT AT UPPER LIMITS OF NORMAL SIZE  . TRANSURETHRAL RESECTION OF BLADDER TUMOR  10/08/2011   Procedure: CIRCUMCISION AND TRANSURETHRAL RESECTION OF BLADDER TUMOR (TURBT);  Surgeon: [name], MD;  Location: [facility];  Service: Urology;  Laterality: N/A;  . TRANSURETHRAL RESECTION OF BLADDER TUMOR  11/30/2011   Procedure: TRANSURETHRAL RESECTION OF BLADDER TUMOR  (TURBT);  Surgeon: [name], MD;  Location: [facility];  Service: Urology;  Laterality: N/A;    Vitals:   06/12/19 1023  BP: (!) 172/98    Subjective Assessment - 06/12/19 1021    Subjective  Pt reports that he talked to doctor and they were going to communicate with [place] about upper end for BP. Pt denies any issues with headache. Reports feeling a little stiff in morning but improves as gets moving. Pt reports that BP this morning was 170/91 right after taking his meds.    Pertinent History  PMH: HTN, DM2, essential tremors    Patient Stated Goals  Pt wants to be able to walk better and drive again.    Currently in Pain?  No/denies         [facility] PT Assessment - 06/12/19 1032      6 Minute Walk- Baseline   6 Minute Walk- Baseline  yes    BP (mmHg)  (!) 172/98      6 Minute walk- Post Test   6 Minute Walk Post Test  yes    BP (mmHg)  (!) 172/100    Modified Borg Scale for Dyspnea  0- Nothing at all   able to talk throughout     6 minute walk test results    Aerobic Endurance Distance Walked  1150    Endurance additional comments  Pt denied any headache or SOB with walking. Good reciprocal  gait pattern throughout                   [facility] Adult PT Treatment/Exercise - 06/12/19 1032      Ambulation/Gait   Ambulation/Gait  Yes    Ambulation Distance (Feet)  1150 Feet    Gait velocity  comfortable=1.36ms and fast=1.314m      Neuro Re-ed    Neuro Re-ed Details   Pt performed tandem stance each position 30 sec x 2 each position. SLS with other foot on soccerball 10 sec x 2 each leg, alternating toe taps on soccerball x 20 without UE support.BP=176/100 after             PT Education - 06/12/19 1247    Education Details  Pt to continue with current HEP    Person(s) Educated  Patient    Methods  Explanation    Comprehension  Verbalized understanding       PT Short Term Goals - 06/12/19 1030      PT SHORT TERM GOAL #1   Title   Pt will be independent with initial strengthening and balance HEP to continue gains on own.    Baseline  06/01/19: met today with current program    Status  Achieved    Target Date  06/02/19      PT SHORT TERM GOAL #2   Title  Pt will increase SLS to >10 sec bilateral for improved balance.    Baseline  05/02/19 right=5 and left=6, 4 left and 6 right on 06/12/2019    Time  4    Period  Weeks    Status  On-going    Target Date  06/02/19      PT SHORT TERM GOAL #3   Title  Pt will increase gait speed from 0.9213mto >1.46m/60mor improved community mobility.    Baseline  0.39m/47m 05/02/2019, comfortable=1.46m/s 70m fast=1.3 m/s    Time  4    Period  Weeks    Status  Achieved    Target Date  06/02/19        PT Long Term Goals - 05/03/19 0655  [id number] LONG TERM GOAL #1   Title  Pt will be independent with progressive HEP for strengthening and balance to maintain gains on own.    Time  8    Period  Weeks    Status  New    Target Date  07/01/19      PT LONG TERM GOAL #2   Title  Pt will improved FGA score form 24/30 to 26/30 or better for decreased fall risk.    Baseline  24/30 on 05/02/2019    Time  8    Period  Weeks    Status  New    Target Date  07/01/19      PT LONG TERM GOAL #3   Title  Pt will ambulate >500 on varied surfaces with no evidence of decreased foot clearance on right for improved community ambulation.    Time  8    Period  Weeks    Status  New    Target Date  07/01/19            Plan - 06/12/19 1248    Clinical Impression Statement  Pt continues to have elvated BP but asymptomatic and no increase with low level activity today. PT will communicate BP readings to PCP. Pt showing improving gait quality with good foot clearance bilateral today. No  fatigue with 6 min walk testing. Pt met gait speed goal showing good speed for community ambulator. Pt improved with practice on SLS activities.    Personal Factors and Comorbidities  Comorbidity 3+     Comorbidities  HTN, DM2, essential tremors    Examination-Activity Limitations  Locomotion Level    Examination-Participation Restrictions  Other   work   Stability/Clinical Decision Making  Evolving/Moderate complexity    Rehab Potential  Excellent    PT Frequency  1x / week    PT Duration  8 weeks   plus eval   PT Treatment/Interventions  ADLs/Self Care Home Management;Gait training;Stair training;Functional mobility training;Therapeutic activities;Patient/family education;Neuromuscular re-education;Balance training;Therapeutic exercise    PT Next Visit Plan  monitor BP readings, finalize HEP and check LTG as progressing faster than anticipated.    Consulted and Agree with Plan of Care  Patient;Family member/caregiver    Family Member Consulted  wife       Patient will benefit from skilled therapeutic intervention in order to improve the following deficits and impairments:  Abnormal gait, Decreased balance  Visit Diagnosis: Muscle weakness (generalized)  Other abnormalities of gait and mobility     Problem List Patient Active Problem List   Diagnosis Date Noted  . Hemorrhagic stroke ([place]) 03/28/2019  . Hypertensive crisis 03/23/2019  . HTN (hypertension), malignant 03/23/2019  . Subacute intracranial hemorrhage ([place])   . Hypertensive urgency   . Right leg weakness 01/10/2019  . Right ankle swelling 07/12/2018  . Tremor, essential 02/27/2014  . Routine health maintenance 01/11/2013  . History of colonic polyps 04/10/2010  . 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 Hyperlipidemia associated with type 2 diabetes mellitus ([place]) 11/17/2007  . Diabetes mellitus type 2 with complications ([place]) 38/18/4037  . IMPOTENCE 11/16/2007  . Essential hypertension, malignant 11/16/2007  . HEMORRHOIDS 11/16/2007    Electa Sniff, PT, DPT, NCS 06/12/2019, 12:51 PM  [facility] [address] [place], [place],  [address] Phone: [phone]   Fax:  [phone]  Name: Drew Harrington MRN: [id number] Date of Birth: 03-16-1946

## 2019-06-18 ENCOUNTER — Ambulatory Visit: Payer: Medicare Other | Admitting: Physical Therapy

## 2019-06-19 ENCOUNTER — Ambulatory Visit: Payer: Medicare Other

## 2019-06-19 ENCOUNTER — Other Ambulatory Visit: Payer: Self-pay

## 2019-06-19 VITALS — BP 160/98

## 2019-06-19 DIAGNOSIS — R2689 Other abnormalities of gait and mobility: Secondary | ICD-10-CM | POA: Diagnosis not present

## 2019-06-19 DIAGNOSIS — R278 Other lack of coordination: Secondary | ICD-10-CM | POA: Diagnosis not present

## 2019-06-19 DIAGNOSIS — M6281 Muscle weakness (generalized): Secondary | ICD-10-CM | POA: Diagnosis not present

## 2019-06-19 DIAGNOSIS — I69318 Other symptoms and signs involving cognitive functions following cerebral infarction: Secondary | ICD-10-CM | POA: Diagnosis not present

## 2019-06-19 NOTE — Therapy (Signed)
[facility] [address] [place], [place], [address] Phone: [phone]   Fax:  [phone]  Physical Therapy Treatment/Discharge summary  Patient Details  Name: Drew Harrington MRN: [id number] Date of Birth: 1946/02/26 Referring Provider (PT): Eulogio Bear referred (sending cert to Pricilla Holm)  [place] SUMMARY  Visits from Start of Care: 5  Current functional level related to goals / functional outcomes: Pt is ambulating independently. See goals for more information.   Remaining deficits: High BP.   Education / Equipment: HEP, monitoring BP education  Plan: Patient agrees to discharge.  Patient goals were met. Patient is being discharged due to meeting the stated rehab goals.  ?????       Encounter Date: 06/19/2019  PT End of Session - 06/19/19 1018    Visit Number  5    Number of Visits  9    Date for PT Re-Evaluation  07/01/19    Authorization Type  [employer] medicare 10th visit progress note    PT Start Time  1015    PT Stop Time  1059    PT Time Calculation (min)  44 min    Activity Tolerance  Treatment limited secondary to medical complications (Comment)   elevated BP, session ended.   Behavior During Therapy  [facility] for tasks assessed/performed       Past Medical History:  Diagnosis Date  . Allergic rhinitis   . Benign neoplasm of colon   . Bladder cancer ([place])   . Borderline diabetes   . HTN (hypertension)   . Insomnia, unspecified   . Perirectal abscess 05/10/2013  . Personal history of colonic adenomas 04/10/2010  . Psychosexual dysfunction with inhibited sexual excitement   . Subacute intracranial hemorrhage ([place])   . Unspecified hemorrhoids without mention of complication     Past Surgical History:  Procedure Laterality Date  . CATARACT EXTRACTION W/ INTRAOCULAR LENS  IMPLANT, BILATERAL    . COLONOSCOPY    . removal cyst hip Left 05/23/2013  . TRANSTHORACIC  ECHOCARDIOGRAM  04-06-2006   LVSF NORMAL/  EF 60%/ AORTIC ROOT AT UPPER LIMITS OF NORMAL SIZE  . TRANSURETHRAL RESECTION OF BLADDER TUMOR  10/08/2011   Procedure: CIRCUMCISION AND TRANSURETHRAL RESECTION OF BLADDER TUMOR (TURBT);  Surgeon: [name], MD;  Location: [facility];  Service: Urology;  Laterality: N/A;  . TRANSURETHRAL RESECTION OF BLADDER TUMOR  11/30/2011   Procedure: TRANSURETHRAL RESECTION OF BLADDER TUMOR (TURBT);  Surgeon: [name], MD;  Location: [facility];  Service: Urology;  Laterality: N/A;    Vitals:   06/19/19 1020  BP: (!) 160/98    Subjective Assessment - 06/19/19 1018    Subjective  Pt reports that he has been doing well. Walking doing pretty good. Right shoulder was a little sore the other day as thinks he slept wrong on it. Doing ok now. Pt reports one small headache on Sunday that didn't last too long when layed down.    Pertinent History  PMH: HTN, DM2, essential tremors    Patient Stated Goals  Pt wants to be able to walk better and drive again.    Currently in Pain?  No/denies         [facility] PT Assessment - 06/19/19 1022      Functional Gait  Assessment   Gait assessed   Yes    Gait Level Surface  Walks 20 ft in less than 5.5 sec, no assistive devices, good speed,  no evidence for imbalance, normal gait pattern, deviates no more than 6 in outside of the 12 in walkway width.    Change in Gait Speed  Able to smoothly change walking speed without loss of balance or gait deviation. Deviate no more than 6 in outside of the 12 in walkway width.    Gait with Horizontal Head Turns  Performs head turns smoothly with no change in gait. Deviates no more than 6 in outside 12 in walkway width    Gait with Vertical Head Turns  Performs head turns with no change in gait. Deviates no more than 6 in outside 12 in walkway width.    Gait and Pivot Turn  Pivot turns safely within 3 sec and stops quickly with no loss of  balance.    Step Over Obstacle  Is able to step over 2 stacked shoe boxes taped together (9 in total height) without changing gait speed. No evidence of imbalance.    Gait with Narrow Base of Support  Ambulates 7-9 steps.    Gait with Eyes Closed  Walks 20 ft, no assistive devices, good speed, no evidence of imbalance, normal gait pattern, deviates no more than 6 in outside 12 in walkway width. Ambulates 20 ft in less than 7 sec.    Ambulating Backwards  Walks 20 ft, uses assistive device, slower speed, mild gait deviations, deviates 6-10 in outside 12 in walkway width.    Steps  Alternating feet, must use rail.    Total Score  27                   [facility] Adult PT Treatment/Exercise - 06/19/19 1036      Transfers   Transfers  Sit to Stand;Stand to Sit    Sit to Stand  7: Independent    Stand to Sit  7: Independent      Ambulation/Gait   Ambulation/Gait  Yes    Ambulation/Gait Assistance Details  Pt was given verbal cues to increase foot clearance to not hear any scuff on treadmill on right.    Ambulation Distance (Feet)  800 Feet    Assistive device  None    Gait Pattern  Step-through pattern    Ambulation Surface  Level;Unlevel;Outdoor;Paved;Grass    Stairs  Yes    Stairs Assistance  6: Modified independent (Device/Increase time)    Stair Management Technique  No rails    Number of Stairs  4    Curb  7: Independent    Gait Comments  Pt ambulated on treadmill x 6 min at 1.82mh with 2% grade.  BP=180/106 after treadmill. After sitting 5 min BP=168/98      Neuro Re-ed    Neuro Re-ed Details   SLS 6 sec left and 9 sec right.             PT Education - 06/19/19 2002    Education Details  Pt was instructed to continue with current HEP. Advised to keep BP log and to schedule appointment with MD ASAP to get BP addressed. Explained that MD wanted to see patient to address and would not change anything based on therapist's numbers.    Person(s) Educated  Patient     Methods  Explanation    Comprehension  Verbalized understanding       PT Short Term Goals - 06/19/19 1053      PT SHORT TERM GOAL #1   Title  Pt will be independent with initial strengthening and balance HEP  to continue gains on own.    Baseline  06/01/19: met today with current program    Status  Achieved    Target Date  06/02/19      PT SHORT TERM GOAL #2   Title  Pt will increase SLS to >10 sec bilateral for improved balance.    Baseline  05/02/19 right=5 and left=6, 4 left and 6 right on 06/12/2019, 06/19/2019 6 sec left and 9 sec right    Time  4    Period  Weeks    Status  Partially Met    Target Date  06/02/19      PT SHORT TERM GOAL #3   Title  Pt will increase gait speed from 0.53ms to >1.179m for improved community mobility.    Baseline  0.92752mon 05/02/2019, comfortable=1.52m/66mnd fast=1.3 m/s    Time  4    Period  Weeks    Status  Achieved    Target Date  06/02/19        PT Long Term Goals - 06/19/19 1022      PT LONG TERM GOAL #1   Title  Pt will be independent with progressive HEP for strengthening and balance to maintain gains on own.    Time  8    Period  Weeks    Status  Achieved      PT LONG TERM GOAL #2   Title  Pt will improved FGA score form 24/30 to 26/30 or better for decreased fall risk.    Baseline  24/30 on 05/02/2019, 27/30 on FGA on 06/19/2019    Time  8    Period  Weeks    Status  Achieved      PT LONG TERM GOAL #3   Title  Pt will ambulate >500 on varied surfaces with no evidence of decreased foot clearance on right for improved community ambulation.    Time  8    Period  Weeks    Status  Achieved            Plan - 06/19/19 2003    Clinical Impression Statement  Pt has met all goals except for SLS time. He continues to work on this as part of home program. Pt met FGA goal today showing low fall risk. Pt was independent with gait on varied surfaces without AD. Pt's biggest limiter with therapy being able to push him more has been  continued elevated BP. PCP advised that patient would need to see her for it to be addressed. Pt will be calling to schedule appointment. PT discharging at this time to home program.    Personal Factors and Comorbidities  Comorbidity 3+    Comorbidities  HTN, DM2, essential tremors    Examination-Activity Limitations  Locomotion Level    Examination-Participation Restrictions  Other   work   Stability/Clinical Decision Making  Evolving/Moderate complexity    Rehab Potential  Excellent    PT Frequency  1x / week    PT Duration  8 weeks   plus eval   PT Treatment/Interventions  ADLs/Self Care Home Management;Gait training;Stair training;Functional mobility training;Therapeutic activities;Patient/family education;Neuromuscular re-education;Balance training;Therapeutic exercise    PT Next Visit Plan  Discharged today    Consulted and Agree with Plan of Care  Patient;Family member/caregiver       Patient will benefit from skilled therapeutic intervention in order to improve the following deficits and impairments:  Abnormal gait, Decreased balance  Visit Diagnosis: Other abnormalities of gait and mobility  Problem List Patient Active Problem List   Diagnosis Date Noted  . Hemorrhagic stroke ([place]) 03/28/2019  . Hypertensive crisis 03/23/2019  . HTN (hypertension), malignant 03/23/2019  . Subacute intracranial hemorrhage ([place])   . Hypertensive urgency   . Right leg weakness 01/10/2019  . Right ankle swelling 07/12/2018  . Tremor, essential 02/27/2014  . Routine health maintenance 01/11/2013  . History of colonic polyps 04/10/2010  . 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 Hyperlipidemia associated with type 2 diabetes mellitus ([place]) 11/17/2007  . Diabetes mellitus type 2 with complications ([place]) 53/79/4327  . IMPOTENCE 11/16/2007  . Essential hypertension, malignant 11/16/2007  . HEMORRHOIDS 11/16/2007    Electa Sniff, PT, DPT, NCS 06/19/2019, 8:07 PM  [facility] [address] [place], [place], [address] Phone: [phone]   Fax:  [phone]  Name: AILTON VALLEY MRN: [id number] Date of Birth: Jan 19, 1946

## 2019-06-25 DIAGNOSIS — R413 Other amnesia: Secondary | ICD-10-CM | POA: Diagnosis not present

## 2019-06-25 DIAGNOSIS — G250 Essential tremor: Secondary | ICD-10-CM | POA: Diagnosis not present

## 2019-06-25 DIAGNOSIS — I639 Cerebral infarction, unspecified: Secondary | ICD-10-CM | POA: Diagnosis not present

## 2019-06-26 ENCOUNTER — Ambulatory Visit: Payer: Medicare Other | Admitting: Physical Therapy

## 2019-07-02 ENCOUNTER — Telehealth: Payer: Self-pay | Admitting: Internal Medicine

## 2019-07-02 NOTE — Telephone Encounter (Signed)
I received the forms via fax.   They have been completed & Placed in providers box to review/assit and sign.  Provider is out of office until 1/6.

## 2019-07-02 NOTE — Telephone Encounter (Signed)
Patient calling to let office know [employer] [facility] will be faxing over paperwork for him.

## 2019-07-03 ENCOUNTER — Ambulatory Visit: Payer: Medicare Other | Attending: Internal Medicine | Admitting: Occupational Therapy

## 2019-07-03 ENCOUNTER — Ambulatory Visit: Payer: Medicare Other | Admitting: Occupational Therapy

## 2019-07-03 ENCOUNTER — Other Ambulatory Visit: Payer: Self-pay

## 2019-07-03 DIAGNOSIS — I69318 Other symptoms and signs involving cognitive functions following cerebral infarction: Secondary | ICD-10-CM | POA: Diagnosis not present

## 2019-07-03 DIAGNOSIS — R278 Other lack of coordination: Secondary | ICD-10-CM | POA: Diagnosis not present

## 2019-07-03 DIAGNOSIS — M6281 Muscle weakness (generalized): Secondary | ICD-10-CM | POA: Diagnosis not present

## 2019-07-03 DIAGNOSIS — R2689 Other abnormalities of gait and mobility: Secondary | ICD-10-CM | POA: Insufficient documentation

## 2019-07-03 DIAGNOSIS — R6889 Other general symptoms and signs: Secondary | ICD-10-CM | POA: Diagnosis not present

## 2019-07-03 NOTE — Patient Instructions (Addendum)
  Coordination Activities  Perform the following activities for 20 minutes 1 times per day with both hand(s).   Rotate ball in fingertips (clockwise and counter-clockwise).  Toss ball between hands.  Toss ball in air and catch with the same hand.  Flip cards 1 at a time as fast as you can.  Deal cards with your thumb (Hold deck in hand and push card off top with thumb).  Pick up coins and place in container or coin bank.  Pick up coins and stack.  Pick up coins one at a time until you get 5-10 in your hand, then move coins from palm to fingertips to place in container one at a time.   1. Grip Strengthening (Resistive Putty)   Squeeze putty using thumb and all fingers. Repeat _20___ times. Do __2__ sessions per day.   2. Roll putty into tube on table and pinch between each finger and thumb x 10 reps each. (can do ring and small finger together)     Copyright  VHI. All rights reserved.

## 2019-07-03 NOTE — Therapy (Signed)
[facility] [address] [place], [place], [address] Phone: [phone]   Fax:  [phone]  Occupational Therapy Treatment  Patient Details  Name: Drew Harrington MRN: [id number] Date of Birth: May 04, 1946 No data recorded  Encounter Date: 07/03/2019  OT End of Session - 07/03/19 0855    Visit Number  2    Number of Visits  5    Date for OT Re-Evaluation  07/18/19    Authorization Type  [employer] MCR    OT Start Time  0851    OT Stop Time  0930    OT Time Calculation (min)  39 min    Activity Tolerance  Patient tolerated treatment well    Behavior During Therapy  [facility] for tasks assessed/performed       Past Medical History:  Diagnosis Date  . Allergic rhinitis   . Benign neoplasm of colon   . Bladder cancer ([place])   . Borderline diabetes   . HTN (hypertension)   . Insomnia, unspecified   . Perirectal abscess 05/10/2013  . Personal history of colonic adenomas 04/10/2010  . Psychosexual dysfunction with inhibited sexual excitement   . Subacute intracranial hemorrhage ([place])   . Unspecified hemorrhoids without mention of complication     Past Surgical History:  Procedure Laterality Date  . CATARACT EXTRACTION W/ INTRAOCULAR LENS  IMPLANT, BILATERAL    . COLONOSCOPY    . removal cyst hip Left 05/23/2013  . TRANSTHORACIC ECHOCARDIOGRAM  04-06-2006   LVSF NORMAL/  EF 60%/ AORTIC ROOT AT UPPER LIMITS OF NORMAL SIZE  . TRANSURETHRAL RESECTION OF BLADDER TUMOR  10/08/2011   Procedure: CIRCUMCISION AND TRANSURETHRAL RESECTION OF BLADDER TUMOR (TURBT);  Surgeon: [name], MD;  Location: [facility];  Service: Urology;  Laterality: N/A;  . TRANSURETHRAL RESECTION OF BLADDER TUMOR  11/30/2011   Procedure: TRANSURETHRAL RESECTION OF BLADDER TUMOR (TURBT);  Surgeon: [name], MD;  Location: [facility];  Service: Urology;  Laterality: N/A;    There were no vitals filed  for this visit.  Subjective Assessment - 07/03/19 0854    Pertinent History  CVA 03/22/19. PMH: essential tremor, DM2, HTN    Limitations  Monitor BP    Patient Stated Goals  I'd like to return to part time work if possible and driving    Currently in Pain?  Yes    Pain Score  2     Pain Location  Shoulder    Pain Orientation  Right;Left    Pain Descriptors / Indicators  Aching    Pain Type  Acute pain    Pain Onset  More than a month ago    Pain Frequency  Intermittent    Aggravating Factors   mornings    Pain Relieving Factors  movement                           OT Treatment/Education - 07/03/19 1206    Education Details  coordination and putty HEP(green) min v.c -see pt instructions   Person(s) Educated  Patient    Methods  Explanation;Demonstration;Verbal cues;Handout    Comprehension  Verbalized understanding;Returned demonstration;Verbal cues required          OT Long Term Goals - 07/03/19 0856      OT LONG TERM GOAL #1   Title  Independent with coordination HEP and putty HEP    Time  4    Period  Weeks    Status  New      OT LONG TERM GOAL #2   Title  Pt to verbalize understanding with compensatory strategies and possible A/E to help manage essential tremor w/ writing and fine motor tasks    Time  4    Period  Weeks    Status  New      OT LONG TERM GOAL #3   Title  Pt to be independent with BUE HEP for high level shoulder ROM    Time  4    Period  Weeks    Status  New      OT LONG TERM GOAL #4   Title  Pt to perform environmental scanning w/ simple physical task w/ 90% accuracy    Time  4    Period  Weeks      OT LONG TERM GOAL #5   Title  Pt to simulate work related tasks with independence    Time  4    Period  Weeks    Status  New            Plan - 07/03/19 1204    Clinical Impression Statement  Pt is progressing towards goals. He demonstrates understanding of inital coordination and putty HEP.    Occupational  performance deficits (Please refer to evaluation for details):  IADL's;Work    Body Structure / Function / Physical Skills  ROM;IADL;Strength;Coordination;FMC;Tone;UE functional use;Endurance    Cognitive Skills  Memory    Rehab Potential  Good    Clinical Decision Making  Limited treatment options, no task modification necessary    Comorbidities Affecting Occupational Performance:  Presence of comorbidities impacting occupational performance    Comorbidities impacting occupational performance description:  essential tremor, HTN    Modification or Assistance to Complete Evaluation   No modification of tasks or assist necessary to complete eval    OT Frequency  1x / week    OT Duration  4 weeks   Plus eval   OT Treatment/Interventions  Self-care/ADL training;Therapeutic exercise;Functional Mobility Training;Aquatic Therapy;Neuromuscular education;Manual Therapy;Therapeutic activities;DME and/or AE instruction;Cognitive remediation/compensation;Visual/perceptual remediation/compensation;Passive range of motion;Patient/family education    Plan  shoulder ROM, consider supine/ seated ball ex,  simulate work activities (using coban)    Consulted and Agree with Plan of Care  Patient       Patient will benefit from skilled therapeutic intervention in order to improve the following deficits and impairments:   Body Structure / Function / Physical Skills: ROM, IADL, Strength, Coordination, FMC, Tone, UE functional use, Endurance Cognitive Skills: Memory     Visit Diagnosis: Other lack of coordination  Muscle weakness (generalized)  Other symptoms and signs involving cognitive functions following cerebral infarction    Problem List Patient Active Problem List   Diagnosis Date Noted  . Hemorrhagic stroke ([place]) 03/28/2019  . Hypertensive crisis 03/23/2019  . HTN (hypertension), malignant 03/23/2019  . Subacute intracranial hemorrhage ([place])   . Hypertensive urgency   . Right leg weakness  01/10/2019  . Right ankle swelling 07/12/2018  . Tremor, essential 02/27/2014  . Routine health maintenance 01/11/2013  . History of colonic polyps 04/10/2010  . 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 Hyperlipidemia associated with type 2 diabetes mellitus ([place]) 11/17/2007  . Diabetes mellitus type 2 with complications ([place]) 54/62/7035  . IMPOTENCE 11/16/2007  . Essential hypertension, malignant 11/16/2007  . HEMORRHOIDS 11/16/2007    Calysta Craigo 07/03/2019, 12:06 PM  [facility] [address] [place],  [place], [address] Phone: [phone]   Fax:  [phone]  Name: Drew Harrington MRN: [id number] Date of Birth: 1945-09-29

## 2019-07-04 NOTE — Telephone Encounter (Signed)
Patient needed an appointment to have an assessment of limitations. He has been informed and appointment set up for 07/05/19.

## 2019-07-05 ENCOUNTER — Ambulatory Visit: Payer: Medicare Other | Admitting: Internal Medicine

## 2019-07-06 ENCOUNTER — Ambulatory Visit (INDEPENDENT_AMBULATORY_CARE_PROVIDER_SITE_OTHER): Payer: Medicare Other | Admitting: Internal Medicine

## 2019-07-06 ENCOUNTER — Ambulatory Visit (INDEPENDENT_AMBULATORY_CARE_PROVIDER_SITE_OTHER): Payer: Medicare Other

## 2019-07-06 ENCOUNTER — Ambulatory Visit (INDEPENDENT_AMBULATORY_CARE_PROVIDER_SITE_OTHER): Payer: Medicare Other | Admitting: Family Medicine

## 2019-07-06 ENCOUNTER — Other Ambulatory Visit: Payer: Self-pay

## 2019-07-06 ENCOUNTER — Encounter: Payer: Self-pay | Admitting: Internal Medicine

## 2019-07-06 ENCOUNTER — Encounter: Payer: Self-pay | Admitting: Family Medicine

## 2019-07-06 VITALS — BP 126/84 | HR 80 | Ht 69.0 in | Wt 188.0 lb

## 2019-07-06 VITALS — BP 126/84 | HR 80 | Temp 98.3°F | Ht 69.0 in | Wt 188.0 lb

## 2019-07-06 DIAGNOSIS — M7501 Adhesive capsulitis of right shoulder: Secondary | ICD-10-CM | POA: Diagnosis not present

## 2019-07-06 DIAGNOSIS — M7502 Adhesive capsulitis of left shoulder: Secondary | ICD-10-CM

## 2019-07-06 DIAGNOSIS — M25519 Pain in unspecified shoulder: Secondary | ICD-10-CM | POA: Insufficient documentation

## 2019-07-06 DIAGNOSIS — M25512 Pain in left shoulder: Secondary | ICD-10-CM

## 2019-07-06 DIAGNOSIS — I10 Essential (primary) hypertension: Secondary | ICD-10-CM | POA: Diagnosis not present

## 2019-07-06 DIAGNOSIS — E118 Type 2 diabetes mellitus with unspecified complications: Secondary | ICD-10-CM | POA: Diagnosis not present

## 2019-07-06 DIAGNOSIS — M25511 Pain in right shoulder: Secondary | ICD-10-CM

## 2019-07-06 DIAGNOSIS — M19012 Primary osteoarthritis, left shoulder: Secondary | ICD-10-CM | POA: Diagnosis not present

## 2019-07-06 DIAGNOSIS — I619 Nontraumatic intracerebral hemorrhage, unspecified: Secondary | ICD-10-CM

## 2019-07-06 DIAGNOSIS — M19011 Primary osteoarthritis, right shoulder: Secondary | ICD-10-CM | POA: Diagnosis not present

## 2019-07-06 LAB — POCT GLYCOSYLATED HEMOGLOBIN (HGB A1C): Hemoglobin A1C: 5.8 % — AB (ref 4.0–5.6)

## 2019-07-06 NOTE — Patient Instructions (Signed)
The HgA1c is 5.8 today which is good.   We will get you in with sports medicine today to look at the shoulders.

## 2019-07-06 NOTE — Progress Notes (Signed)
Mild arthritis at the [facility] joint like we discussed.  No significant arthritis at the main shoulder joint on the left.

## 2019-07-06 NOTE — Progress Notes (Signed)
Mild arthritis at the [facility] joint and minimal arthritis at the main shoulder joint on the right.

## 2019-07-06 NOTE — Progress Notes (Signed)
Subjective:    I'm seeing this patient as a consultation for:  Dr. [name]. Note will be routed back to referring provider/PCP.  CC: B shoulder pain  I, Molly Weber, LAT, ATC, am serving as scribe for Dr. Lynne Leader.  HPI: Pt is a 74 y/o male presenting w/ c/o B shoulder pain since September 2020.  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  He has been doing homehealth PT/OT for residual deficits from his stroke. His pain is located at his superior/anterior B shoulders and is aggravated w/ attempting to reach overhead or behind his body.  Pt rates his pain at a 6-7/10 but only w/ aggravating motions.  His pain radiates into his B upper arms, particularly in the mornings.  He reports having numbness/tingling in his B upper arms.  He denies any mechanical shoulder symptoms or neck pain.  He notes shoulder pain occurs with normal activities of daily living like bathing and dressing.  He also notes some difficulty with driving due to his shoulder mobility.  He notes that he can drive a car in a straight line but has difficulty manipulating the steering wheel fully due to his lack of range of motion.  He is not currently driving.  He also notes pain occurs at sleep.  He is tried some limited range of motion exercises and physical therapy.  His physical therapy is predominantly neuro rehab.  Past medical history, Surgical history, Family history, Social history, Allergies, and medications have been entered into the medical record, reviewed.   Review of Systems: No headache, visual changes, nausea, vomiting, diarrhea, constipation, dizziness, abdominal pain, skin rash, fevers, chills, night sweats, weight loss, swollen lymph nodes, body aches, joint swelling, muscle aches, chest pain, shortness of breath, mood changes, visual or auditory hallucinations.   Objective:    Vitals:   07/06/19 0838  BP: 126/84  Pulse: 80  SpO2: 98%   General: Well Developed, well nourished,  and in no acute distress.  Neuro/Psych: Alert and oriented x3, extra-ocular muscles intact, able to move all 4 extremities, sensation grossly intact. Skin: Warm and dry, no rashes noted.  Respiratory: Not using accessory muscles, speaking in full sentences, trachea midline.  Cardiovascular: Pulses palpable, no extremity edema. Abdomen: Does not appear distended. MSK:  Right shoulder: Normal-appearing Range of motion abduction 100 degrees.  Internal rotation lumbar spine.  External rotation 10 degrees beyond neutral position. Strength: Abduction 5/5, external rotation 5/5, internal rotation 5/5. Mildly positive Hawkins and Neer's test however lack of range of motion limits accuracy. Negative Yergason's and speeds test.  Left shoulder: Normal-appearing Range of motion abduction 100 degrees.  Internal rotation lumbar spine.  External rotation 10 degrees below neutral position. Strength 5/5 abduction external rotation and internal rotation. Minimally positive Hawkins and Neer's test again lack of range of motion limits accuracy. Negative Yergason's and speeds test.  Lab and Radiology Results:  DG Shoulder Right  Result Date: 07/06/2019 CLINICAL DATA:  Generalized pain. EXAM: RIGHT SHOULDER - 2+ VIEW COMPARISON:  No prior. FINDINGS: Acromioclavicular and glenohumeral degenerative change. No evidence of fracture or dislocation. No evidence of separation. IMPRESSION: Acromioclavicular glenohumeral degenerative change. No acute abnormality. Electronically Signed   By: Marcello Moores  Register   On: 07/06/2019 09:26   DG Shoulder Left  Result Date: 07/06/2019 CLINICAL DATA:  Generalized pain EXAM: LEFT SHOULDER - 2+ VIEW COMPARISON:  None. FINDINGS: Degenerative changes in the [facility] joint with joint space narrowing and spurring. Glenohumeral joint is  maintained. No acute bony abnormality. Specifically, no fracture, subluxation, or dislocation. Soft tissues are intact. IMPRESSION: Mild degenerative changes in  the left AC joint. No acute bony abnormality. Electronically Signed   By: Rolm Baptise M.D.   On: 07/06/2019 09:26   I, Lynne Leader, personally (independently) visualized and performed the interpretation of the images attached in this note.   Procedure: Real-time Ultrasound Guided large volume injection of right glenohumeral joint Device: Philips Affiniti 50G Images permanently stored and available for review in the ultrasound unit. Verbal informed consent obtained.  Discussed risks and benefits of procedure. Warned about infection bleeding damage to structures skin hypopigmentation and fat atrophy among others. Patient expresses understanding and agreement Time-out conducted.   Noted no overlying erythema, induration, or other signs of local infection.   Skin prepped in a sterile fashion.   Local anesthesia: Topical Ethyl chloride.   With sterile technique and under real time ultrasound guidance:  40 mg of Kenalog and 9 mL of Marcaine injected easily.   Completed without difficulty   Pain immediately resolved suggesting accurate placement of the medication.   Advised to call if fevers/chills, erythema, induration, drainage, or persistent bleeding.   Images permanently stored and available for review in the ultrasound unit.  Impression: Technically successful ultrasound guided injection.        Results for orders placed or performed in visit on 07/06/19 (from the past 72 hour(s))  POCT HgB A1C     Status: Abnormal   Collection Time: 07/06/19  8:27 AM  Result Value Ref Range   Hemoglobin A1C 5.8 (A) 4.0 - 5.6 %   HbA1c POC (<> result, manual entry)     HbA1c, POC (prediabetic range)     HbA1c, POC (controlled diabetic range)       Impression and Recommendations:    Assessment and Plan: 74 y.o. male with bilateral shoulder pain ongoing since September associated with decreased range of motion.  Occurring in the setting of diabetes and relatively recent hemorrhagic  stroke. 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 Discussed treatment plan and options.  Plan for large-volume glenohumeral injection today and focus dedicated physical therapy and home exercise program to improve range of motion and strength.  Schedule in 2 weeks for contralateral left-sided glenohumeral injection.  We will delay second injection by about 2 weeks to avoid excessive steroid dosing in the setting of diabetes to avoid hyperglycemia.  Return sooner if needed.  Discussed treatment plan and options patient expresses understanding and agreement.  PDMP not reviewed this encounter. Orders Placed This Encounter  Procedures  . DG Shoulder Right    Standing Status:   Future    Number of Occurrences:   1    Standing Expiration Date:   09/02/2020    Order Specific Question:   Reason for Exam (SYMPTOM  OR DIAGNOSIS REQUIRED)    Answer:   eval shoulder pain suspect DJD or frozen shoulder    Order Specific Question:   Preferred imaging location?    Answer:   [facility]    Order Specific Question:   Radiology Contrast Protocol - do NOT remove file path    Answer:   \\[email or web address]  . DG Shoulder Left    Standing Status:   Future    Number of Occurrences:   1    Standing Expiration Date:   09/02/2020    Order Specific Question:   Reason for Exam (SYMPTOM  OR DIAGNOSIS REQUIRED)    Answer:   eval shoulder  pain suspect frozen shouler vs djd    Order Specific Question:   Preferred imaging location?    Answer:   [facility]    Order Specific Question:   Radiology Contrast Protocol - do NOT remove file path    Answer:   \\[email or web address]   No orders of the defined types were placed in this encounter.   Discussed warning signs or symptoms. Please see discharge instructions. Patient expresses understanding.   The above documentation has been reviewed and is accurate and complete Lynne Leader

## 2019-07-06 NOTE — Assessment & Plan Note (Signed)
HgA1c 5.8 today, will keep metformin 1000 mg BID since no side effects and no low sugars.

## 2019-07-06 NOTE — Assessment & Plan Note (Signed)
Still with residual weakness and paresthesia right arm and leg. Lifting restriction 5 pounds at this time cannot do his employment. Forms filled out. He will keep working with PT/OT.

## 2019-07-06 NOTE — Patient Instructions (Signed)
Thank you for coming in today. Call or go to the ER if you develop a large red swollen joint with extreme pain or oozing puss.  Continue physical therapy for shoulder.  Recheck in 2 weeks for left side injection.    Adhesive Capsulitis  Adhesive capsulitis, also called frozen shoulder, causes the shoulder to become stiff and painful to move. This condition happens when there is inflammation of the tendons and ligaments that surround the shoulder joint (shoulder capsule). What are the causes? This condition may be caused by:  An injury to your shoulder joint.  Straining your shoulder.  Not moving your shoulder for a period of time. This can happen if your arm was injured or in a sling.  Long-standing conditions, such as: ? Diabetes. ? Thyroid problems. ? Heart disease. ? Stroke. ? Rheumatoid arthritis. ? Lung disease. In some cases, the cause is not known. What increases the risk? You are more likely to develop this condition if you are:  A woman.  Older than 75 years of age. What are the signs or symptoms? Symptoms of this condition include:  Pain in your shoulder when you move your arm. There may also be pain when parts of your shoulder are touched. The pain may be worse at night or when you are resting.  A sore or aching shoulder.  The inability to move your shoulder normally.  Muscle spasms. How is this diagnosed? This condition is diagnosed with a physical exam and imaging tests, such as an X-ray or MRI. How is this treated? This condition may be treated with:  Treatment of the underlying cause or condition.  Medicine. Medicine may be given to relieve pain, inflammation, or muscle spasms.  Steroid injections into the shoulder joint.  Physical therapy. This involves performing exercises to get the shoulder moving again.  Acupuncture. This is a type of treatment that involves stimulating specific points on your body by inserting thin needles through your  skin.  Shoulder manipulation. This is a procedure to move the shoulder into another position. It is done after you are given a medicine to make you fall asleep (general anesthetic). The joint may also be injected with salt water at high pressure to break down scarring.  Surgery. This may be done in severe cases when other treatments have failed. Although most people recover completely from adhesive capsulitis, some may not regain full shoulder movement. Follow these instructions at home: Managing pain, stiffness, and swelling      If directed, put ice on the injured area: ? Put ice in a plastic bag. ? Place a towel between your skin and the bag. ? Leave the ice on for 20 minutes, 2-3 times per day.  If directed, apply heat to the affected area before you exercise. Use the heat source that your health care provider recommends, such as a moist heat pack or a heating pad. ? Place a towel between your skin and the heat source. ? Leave the heat on for 20-30 minutes. ? Remove the heat if your skin turns bright red. This is especially important if you are unable to feel pain, heat, or cold. You may have a greater risk of getting burned. General instructions  Take over-the-counter and prescription medicines only as told by your health care provider.  If you are being treated with physical therapy, follow instructions from your physical therapist.  Avoid exercises that put a lot of demand on your shoulder, such as throwing. These exercises can make pain worse.  Keep all follow-up visits as told by your health care provider. This is important. Contact a health care provider if:  You develop new symptoms.  Your symptoms get worse. Summary  Adhesive capsulitis, also called frozen shoulder, causes the shoulder to become stiff and painful to move.  You are more likely to have this condition if you are a woman and over age 19.  It is treated with physical therapy, medicines, and sometimes  surgery. This information is not intended to replace advice given to you by your health care provider. Make sure you discuss any questions you have with your health care provider. Document Revised: 11/18/2017 Document Reviewed: 11/18/2017 Elsevier Patient Education  [place].

## 2019-07-06 NOTE — Assessment & Plan Note (Signed)
Taking amlodipine 10 mg daily, hydralazine 25 mg TID, losartan/hctz 100/25 mg daily. Recent labs stable without indication for change. BP good here.

## 2019-07-06 NOTE — Progress Notes (Signed)
   Subjective:   Patient ID: Drew Harrington, male    DOB: August 25, 1945, 74 y.o.   MRN: [id number]  HPI The patient is a 74 YO man coming in for follow up of hemorrhagic stroke (happened July/August, with some residual symptoms, has been working with PT/OT at home, having a lot of loss of ROM in both shoulders, feels like PT is not enough for this, still weakness in legs and arms, PT feels his lifting limit right now is 5 pounds, needs to lift 50 pounds routinely at work, is an Product/process development scientist in ER) and essential hypertension (prior BP at our office normal, at another visit was high, admits to taking his amlodipine, hydralazine, losartan/hctz), and diabetes (last HgA1c 6.3, taking metformin, denies excessive thirst or urination).   Review of Systems  Constitutional: Negative.   HENT: Negative.   Eyes: Negative.   Respiratory: Negative for cough, chest tightness and shortness of breath.   Cardiovascular: Negative for chest pain, palpitations and leg swelling.  Gastrointestinal: Negative for abdominal distention, abdominal pain, constipation, diarrhea, nausea and vomiting.  Musculoskeletal: Positive for arthralgias and myalgias.  Skin: Negative.   Neurological: Positive for weakness and numbness.  Psychiatric/Behavioral: Negative.     Objective:  Physical Exam Constitutional:      Appearance: He is well-developed.  HENT:     Head: Normocephalic and atraumatic.  Cardiovascular:     Rate and Rhythm: Normal rate and regular rhythm.  Pulmonary:     Effort: Pulmonary effort is normal. No respiratory distress.     Breath sounds: Normal breath sounds. No wheezing or rales.  Abdominal:     General: Bowel sounds are normal. There is no distension.     Palpations: Abdomen is soft.     Tenderness: There is no abdominal tenderness. There is no rebound.  Musculoskeletal:        General: Tenderness present.     Cervical back: Normal range of motion.     Comments: Limitation of ROM both shoulders,  weakness right arm and leg, some decrease to sensation right arm and leg  Skin:    General: Skin is warm and dry.  Neurological:     Mental Status: He is alert and oriented to person, place, and time.     Coordination: Coordination abnormal.     Comments: Slow to stand and needs arms to push up from seat     Vitals:   07/06/19 0800  BP: 126/84  Pulse: 80  Temp: 98.3 F (36.8 C)  TempSrc: Oral  SpO2: 98%  Weight: 188 lb (85.3 kg)  Height: 5\' 9"  (1.753 m)    This visit occurred during the SARS-CoV-2 public health emergency.  Safety protocols were in place, including screening questions prior to the visit, additional usage of staff PPE, and extensive cleaning of exam room while observing appropriate contact time as indicated for disinfecting solutions.   Assessment & Plan:

## 2019-07-06 NOTE — Telephone Encounter (Signed)
Forms have been completed & signed. Faxed, Copy sent to scan.   Original mailed to patient & informed.

## 2019-07-06 NOTE — Assessment & Plan Note (Signed)
Will refer to sports medicine for assessment and treatment. May need injection or specific PT for this.

## 2019-07-10 ENCOUNTER — Ambulatory Visit: Payer: Medicare Other | Admitting: Occupational Therapy

## 2019-07-17 ENCOUNTER — Ambulatory Visit: Payer: Medicare Other | Admitting: Occupational Therapy

## 2019-07-17 ENCOUNTER — Other Ambulatory Visit: Payer: Self-pay

## 2019-07-17 DIAGNOSIS — R2689 Other abnormalities of gait and mobility: Secondary | ICD-10-CM | POA: Diagnosis not present

## 2019-07-17 DIAGNOSIS — I69318 Other symptoms and signs involving cognitive functions following cerebral infarction: Secondary | ICD-10-CM | POA: Diagnosis not present

## 2019-07-17 DIAGNOSIS — M6281 Muscle weakness (generalized): Secondary | ICD-10-CM | POA: Diagnosis not present

## 2019-07-17 DIAGNOSIS — R278 Other lack of coordination: Secondary | ICD-10-CM

## 2019-07-17 NOTE — Therapy (Signed)
[facility] [address] [place], [place], [address] Phone: [phone]   Fax:  [phone]  Occupational Therapy Treatment  Patient Details  Name: Drew Harrington MRN: [id number] Date of Birth: 1945/07/01 No data recorded  Encounter Date: 07/17/2019  OT End of Session - 07/17/19 0921    Visit Number  3    Number of Visits  5    Date for OT Re-Evaluation  07/18/19    Authorization Type  [employer] MCR    OT Start Time  0800    OT Stop Time  0845    OT Time Calculation (min)  45 min    Activity Tolerance  Patient tolerated treatment well    Behavior During Therapy  [facility] for tasks assessed/performed       Past Medical History:  Diagnosis Date  . Allergic rhinitis   . Benign neoplasm of colon   . Bladder cancer ([place])   . Borderline diabetes   . HTN (hypertension)   . Insomnia, unspecified   . Perirectal abscess 05/10/2013  . Personal history of colonic adenomas 04/10/2010  . Psychosexual dysfunction with inhibited sexual excitement   . Subacute intracranial hemorrhage ([place])   . Unspecified hemorrhoids without mention of complication     Past Surgical History:  Procedure Laterality Date  . CATARACT EXTRACTION W/ INTRAOCULAR LENS  IMPLANT, BILATERAL    . COLONOSCOPY    . removal cyst hip Left 05/23/2013  . TRANSTHORACIC ECHOCARDIOGRAM  04-06-2006   LVSF NORMAL/  EF 60%/ AORTIC ROOT AT UPPER LIMITS OF NORMAL SIZE  . TRANSURETHRAL RESECTION OF BLADDER TUMOR  10/08/2011   Procedure: CIRCUMCISION AND TRANSURETHRAL RESECTION OF BLADDER TUMOR (TURBT);  Surgeon: [name], MD;  Location[facility];  Service: Urology;  Laterality: N/A;  . TRANSURETHRAL RESECTION OF BLADDER TUMOR  11/30/2011   Procedure: TRANSURETHRAL RESECTION OF BLADDER TUMOR (TURBT);  Surgeon: [name], MD;  Location: [facility];  Service: Urology;  Laterality: N/A;    There were no vitals filed  for this visit.  Subjective Assessment - 07/17/19 0803    Pertinent History  CVA 03/22/19. PMH: essential tremor, DM2, HTN    Limitations  Monitor BP    Patient Stated Goals  I'd like to return to part time work if possible and driving    Currently in Pain?  Yes    Pain Score  6     Pain Location  Arm    Pain Orientation  Left    Pain Descriptors / Indicators  Sore    Pain Onset  More than a month ago    Pain Frequency  Intermittent    Aggravating Factors   movement, at night    Pain Relieving Factors  rest       Pt issued BUE shoulder ROM HEP due to bilateral end range limitations - pt performed each x 10 reps. See pt instructions for details.  Practiced simulating work tasks - pt wrapped coban for simulated casting for a wrist fracture x 2 w/o difficulties.   Practiced writing w/ task modifications and A/E to reduce essential tremors. Pt did best with forearm supported, writing bigger, and Pen Again. Pt also did well w/ built up foam on pen and issued. Pt told where to purchase Pen Again. Writing bigger also helped tremendoulsy  Reviewed goals and progress to date. Pt has met 2 goals and approximating 2 goals - may need review.  OT Education - 07/17/19 [phone]    Education Details  bilateral shoulder ROM HEP    Person(s) Educated  Patient    Methods  Explanation;Demonstration;Handout    Comprehension  Verbalized understanding;Returned demonstration          OT Long Term Goals - 07/17/19 0843      OT LONG TERM GOAL #1   Title  Independent with coordination HEP and putty HEP    Time  4    Period  Weeks    Status  Achieved      OT LONG TERM GOAL #2   Title  Pt to verbalize understanding with compensatory strategies and possible A/E to help manage essential tremor w/ writing and fine motor tasks    Time  4    Period  Weeks    Status  On-going      OT LONG TERM GOAL #3   Title  Pt to be independent with BUE HEP for high level shoulder  ROM    Time  4    Period  Weeks    Status  On-going      OT LONG TERM GOAL #4   Title  Pt to perform environmental scanning w/ simple physical task w/ 90% accuracy    Time  4    Period  Weeks    Status  New      OT LONG TERM GOAL #5   Title  Pt to simulate work related tasks with independence    Time  4    Period  Weeks    Status  Achieved            Plan - 07/17/19 0844    Clinical Impression Statement  Pt progressing towards goals. Pt also w/ increased success writing w/ A/E. Pt simulating work tasks w/o difficulty    Occupational performance deficits (Please refer to evaluation for details):  IADL's;Work    Body Structure / Function / Physical Skills  ROM;IADL;Strength;Coordination;FMC;Tone;UE functional use;Endurance    Cognitive Skills  Memory    Rehab Potential  Good    Comorbidities impacting occupational performance description:  essential tremor, HTN    OT Frequency  1x / week    OT Duration  4 weeks   plus eval   OT Treatment/Interventions  Self-care/ADL training;Therapeutic exercise;Functional Mobility Training;Aquatic Therapy;Neuromuscular education;Manual Therapy;Therapeutic activities;DME and/or AE instruction;Cognitive remediation/compensation;Visual/perceptual remediation/compensation;Passive range of motion;Patient/family education    Plan  environmental scanning, continue towards remaining goals, may d/c next session if pt feels comfortable or continue additional session    Consulted and Agree with Plan of Care  Patient       Patient will benefit from skilled therapeutic intervention in order to improve the following deficits and impairments:   Body Structure / Function / Physical Skills: ROM, IADL, Strength, Coordination, FMC, Tone, UE functional use, Endurance Cognitive Skills: Memory     Visit Diagnosis: Muscle weakness (generalized)  Other lack of coordination    Problem List Patient Active Problem List   Diagnosis Date Noted  . Shoulder  pain 07/06/2019  . Hemorrhagic stroke ([place]) 03/28/2019  . Hypertensive crisis 03/23/2019  . HTN (hypertension), malignant 03/23/2019  . Subacute intracranial hemorrhage ([place])   . Hypertensive urgency   . Right leg weakness 01/10/2019  . Right ankle swelling 07/12/2018  . Tremor, essential 02/27/2014  . Routine health maintenance 01/11/2013  . History of colonic polyps 04/10/2010  . 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 Hyperlipidemia associated with type 2 diabetes mellitus ([place]) 11/17/2007  .  Diabetes mellitus type 2 with complications ([place]) 81/03/3158  . IMPOTENCE 11/16/2007  . Essential hypertension, malignant 11/16/2007  . HEMORRHOIDS 11/16/2007    Drew Harrington, OTR/L 07/17/2019, 9:22 AM  [facility] [address] [place], [place], [address] Phone: [phone]   Fax:  [phone]  Name: NIRVAAN FRETT MRN: [id number] Date of Birth: 09/07/1945

## 2019-07-17 NOTE — Patient Instructions (Signed)
1. Cranial Flexion: Overhead Arm Extension - Supine (Medicine Diona Foley)    Lie with knees bent, arms beyond head, holding full paper towel roll or ball. Pull ball up to above face. Repeat _10___ times per set. Do __2__ sets per day.    SHOULDER: Flexion Bilateral    Raise arms overhead at same speed. Keep elbows straight. _10__ reps per set, _2__ sets per day

## 2019-07-20 ENCOUNTER — Ambulatory Visit: Payer: Self-pay

## 2019-07-20 ENCOUNTER — Ambulatory Visit: Payer: Medicare Other | Admitting: Family Medicine

## 2019-07-20 ENCOUNTER — Other Ambulatory Visit: Payer: Self-pay

## 2019-07-20 VITALS — BP 160/92 | HR 80 | Ht 69.0 in | Wt 183.4 lb

## 2019-07-20 DIAGNOSIS — M7502 Adhesive capsulitis of left shoulder: Secondary | ICD-10-CM

## 2019-07-20 DIAGNOSIS — M25512 Pain in left shoulder: Secondary | ICD-10-CM

## 2019-07-20 NOTE — Progress Notes (Signed)
I, Wendy Poet, LAT, ATC, am serving as scribe for Dr. Lynne Leader.  Drew Harrington is a 74 y.o. male who presents to [place] at [facility] today for f/u B shoulder pain and to get L shoulder injection.  Pt was last seen by Dr. Georgina Snell on 07/06/19 for his B shoulders and had a R shoulder injection at that time.  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  He has pain w/ overhead AROM and functional IR.  He was given a new script for PT to address his shoulder pain.  Since his last visit, pt reports less pain in his R shoulder w/ improved R shoulder ROM.  Pt rates his R shoulder improvement at approximately 80-90%.     Pertinent review of systems: No fevers or chills.  No jitteriness with the steroid injection last visit.  No hypoglycemia noted.  Relevant historical information: Hypertension, Recent stroke.   Exam:  BP (!) 160/92 (BP Location: Left Arm, Patient Position: Sitting, Cuff Size: Large)   Pulse 80   Ht 5\' 9"  (1.753 m)   Wt 183 lb 6.4 oz (83.2 kg)   SpO2 97%   BMI 27.08 kg/m  General: Well Developed, well nourished, and in no acute distress.   MSK: Right shoulder: Full range of motion. Left shoulder: Decreased range of motion.  Intact strength.    Lab and Radiology Results  Procedure: Real-time Ultrasound Guided Injection of left shoulder glenohumeral joint Device: Philips Affiniti 50G Images permanently stored and available for review in the ultrasound unit. Verbal informed consent obtained.  Discussed risks and benefits of procedure. Warned about infection bleeding damage to structures skin hypopigmentation and fat atrophy among others. Patient expresses understanding and agreement Time-out conducted.   Noted no overlying erythema, induration, or other signs of local infection.   Skin prepped in a sterile fashion.   Local anesthesia: Topical Ethyl chloride.   With sterile technique and under  real time ultrasound guidance:  40 mg of Depo-Medrol and 9 mL of Marcaine injected easily.   Completed without difficulty   Pain immediately resolved suggesting accurate placement of the medication.   Advised to call if fevers/chills, erythema, induration, drainage, or persistent bleeding.   Images permanently stored and available for review in the ultrasound unit.  Impression: Technically successful ultrasound guided injection.         Assessment and Plan: 74 y.o. 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  Patient had significant benefit 2 weeks ago with right glenohumeral injection.  Plan to proceed with left-sided injection today and physical therapy and home exercise program.  Recheck back in 6 weeks.  Return sooner if needed.  Precautions reviewed.  Additionally discussed with patient relative safety of different over-the-counter medications for pain.  Recommend Tylenol as first-line as this will be safe for than ibuprofen or Aleve.  Total encounter time 20 minutes including charting time date of service.   PDMP not reviewed this encounter. Orders Placed This Encounter  Procedures  . [place] LIMITED JOINT SPACE STRUCTURES UP LEFT(NO LINKED CHARGES)    Order Specific Question:   Reason for Exam (SYMPTOM  OR DIAGNOSIS REQUIRED)    Answer:   L shoulder pain    Order Specific Question:   Preferred imaging location?    Answer:   [place]   No orders of the defined types were placed in this encounter.    Discussed warning signs  or symptoms. Please see discharge instructions. Patient expresses understanding.   The above documentation has been reviewed and is accurate and complete Lynne Leader

## 2019-07-20 NOTE — Patient Instructions (Signed)
Thank you for coming in today. Work on shoulder motion.  Call or go to the ER if you develop a large red swollen joint with extreme pain or oozing puss.  Recheck back with me in 6 weeks.  Return or contact me sooner if not doing well.  Tylenol is safer than ibuprofen or aleve in general.

## 2019-07-24 ENCOUNTER — Other Ambulatory Visit: Payer: Self-pay

## 2019-07-24 ENCOUNTER — Ambulatory Visit: Payer: Medicare Other | Admitting: Occupational Therapy

## 2019-07-24 ENCOUNTER — Encounter: Payer: Self-pay | Admitting: Occupational Therapy

## 2019-07-24 DIAGNOSIS — R2689 Other abnormalities of gait and mobility: Secondary | ICD-10-CM

## 2019-07-24 DIAGNOSIS — M6281 Muscle weakness (generalized): Secondary | ICD-10-CM

## 2019-07-24 DIAGNOSIS — R278 Other lack of coordination: Secondary | ICD-10-CM | POA: Diagnosis not present

## 2019-07-24 DIAGNOSIS — I69318 Other symptoms and signs involving cognitive functions following cerebral infarction: Secondary | ICD-10-CM | POA: Diagnosis not present

## 2019-07-24 NOTE — Patient Instructions (Signed)
   Keeping Thinking Skills Sharp: 1. Jigsaw puzzles 2. Card/board games 3. Talking on the phone/conversations 4. Lumosity.com 5. Online games 6. Word serches/crossword puzzles 7.  Logic puzzles 8. Aerobic exercise (stationary bike) 9. Eating balanced diet (fruits & veggies) 10. Drink water 11. Try something new--new recipe, hobby 12. Crafts 13. Do a variety of activities that are challenging 14. Add cognitive activities to walking/exercising (think of animal/food/city with each letter of the alphabet, counting backwards, thinking of as many vegetables as you can, etc.).--Only do this  If safe.      Memory Compensation Strategies  1. Use "WARM" strategy. W= write it down A=  associate it R=  repeat it M=  make a mental picture  2. You can keep a Social worker. Use a 3-ring notebook with sections for the following:  calendar, important names and phone numbers, medications, doctors' names/phone numbers, "to do list"/reminders, and a section to journal what you did each day  3. Use a calendar to write appointments down.  4. Write yourself a schedule for the day/make a routine This can be placed on the calendar or in a separate section of the Memory Notebook.  Keeping a regular schedule can help memory.  5. Use medication organizer with sections for each day or morning/evening pills  You may need help loading it  6. Keep a basket, or pegboard by the door.   Place items that you need to take out with you in the basket or on the pegboard.  You may also want to include a message board for reminders.  7. Use sticky notes. Place sticky notes with reminders in a place where the task is performed.  For example:  "turn off the stove" placed by the stove, "lock the door" placed on the door at eye level, "take your medications" on the bathroom mirror or by the place where you normally take your medications  8. Use alarms/timers.  Use while cooking to remind yourself to check on food  or as a reminder to take your medicine, or as a reminder to make a call, or as a reminder to perform another task, etc.  9. Use a voice memo to record important information and notes for yourself.  10.  Organize and reduce clutter

## 2019-07-24 NOTE — Therapy (Signed)
[facility] [address] [place], [place], [address] Phone: [phone]   Fax:  [phone]  Occupational Therapy Treatment  Patient Details  Name: Drew Harrington MRN: [id number] Date of Birth: April 20, 1946 No data recorded  Encounter Date: 07/24/2019  OT End of Session - 07/24/19 0814    Visit Number  4    Number of Visits  5    Date for OT Re-Evaluation  07/18/19    Authorization Type  [employer] MCR    OT Start Time  0807    OT Stop Time  0849    OT Time Calculation (min)  42 min    Activity Tolerance  Patient tolerated treatment well    Behavior During Therapy  [facility] for tasks assessed/performed       Past Medical History:  Diagnosis Date  . Allergic rhinitis   . Benign neoplasm of colon   . Bladder cancer ([place])   . Borderline diabetes   . HTN (hypertension)   . Insomnia, unspecified   . Perirectal abscess 05/10/2013  . Personal history of colonic adenomas 04/10/2010  . Psychosexual dysfunction with inhibited sexual excitement   . Subacute intracranial hemorrhage ([place])   . Unspecified hemorrhoids without mention of complication     Past Surgical History:  Procedure Laterality Date  . CATARACT EXTRACTION W/ INTRAOCULAR LENS  IMPLANT, BILATERAL    . COLONOSCOPY    . removal cyst hip Left 05/23/2013  . TRANSTHORACIC ECHOCARDIOGRAM  04-06-2006   LVSF NORMAL/  EF 60%/ AORTIC ROOT AT UPPER LIMITS OF NORMAL SIZE  . TRANSURETHRAL RESECTION OF BLADDER TUMOR  10/08/2011   Procedure: CIRCUMCISION AND TRANSURETHRAL RESECTION OF BLADDER TUMOR (TURBT);  Surgeon: [name], MD;  Location: [facility];  Service: Urology;  Laterality: N/A;  . TRANSURETHRAL RESECTION OF BLADDER TUMOR  11/30/2011   Procedure: TRANSURETHRAL RESECTION OF BLADDER TUMOR (TURBT);  Surgeon: [name], MD;  Location: [facility];  Service: Urology;  Laterality: N/A;    There were no vitals filed  for this visit.  Subjective Assessment - 07/24/19 0810    Subjective   Pt reports that may return to work as prn a little.  Pt reports that he has the most trouble with memory.  Pt reports that he doesn't write much.  Pt reports that he will follow up with MD regarding medication change for tremor.    Pertinent History  CVA 03/22/19. PMH: essential tremor, DM2, HTN    Limitations  Monitor BP    Patient Stated Goals  I'd like to return to part time work if possible and driving    Currently in Pain?  Yes    Pain Score  2     Pain Location  Shoulder    Pain Orientation  Right;Left    Pain Descriptors / Indicators  Sore    Pain Type  Acute pain    Pain Onset  More than a month ago    Pain Frequency  Intermittent    Aggravating Factors   movement, at night    Pain Relieving Factors  rest        Reviewed/practiced writing strategies.  Pt wrote 5 sentences with good legibility for 4 sentences (forearm supported, focus on writing big, pen again), but decr legibility for 5th likely due to fatigue.   Pt encouraged to continue to practice at home with foam grip/pen again.  Environmental scanning with ambulation and tossing ball between hands with  60% accuracy, upon 1st pass, then 73% accuracy, needed min v.c. to locate all items with 3rd pass.  Items missed on both sides with no specific pattern noted.  Verbally reviewed shoulder HEP and pt reports performing at home.    Checked remaining goals and discussed progress--see below.  Pt requests d/c OT today.   OT Education - 07/24/19 0905    Education Details  Memory Compensation Strategies.  Ways to keep thinking skills sharp.--see pt instructions    Person(s) Educated  Patient    Methods  Explanation;Handout    Comprehension  Verbalized understanding          OT Long Term Goals - 07/24/19 0816      OT LONG TERM GOAL #1   Title  Independent with coordination HEP and putty HEP    Time  4    Period  Weeks    Status  Achieved      OT  LONG TERM GOAL #2   Title  Pt to verbalize understanding with compensatory strategies and possible A/E to help manage essential tremor w/ writing and fine motor tasks    Time  4    Period  Weeks    Status  Achieved   07/24/19:   for wrtiting     OT LONG TERM GOAL #3   Title  Pt to be independent with BUE HEP for high level shoulder ROM    Time  4    Period  Weeks    Status  Achieved      OT LONG TERM GOAL #4   Title  Pt to perform environmental scanning w/ simple physical task w/ 90% accuracy    Time  4    Period  Weeks    Status  Not Met   07/24/19:  60% initially (87% after 3 passess without cueing)     OT LONG TERM GOAL #5   Title  Pt to simulate work related tasks with independence    Time  4    Period  Weeks    Status  Achieved            Plan - 07/24/19 0815    Clinical Impression Statement  Pt has met 4/5 goals.  Pt request OT d/c at this time.  Pt continues to report/demo STM/cognitive deficits.    Occupational performance deficits (Please refer to evaluation for details):  IADL's;Work    Body Structure / Function / Physical Skills  ROM;IADL;Strength;Coordination;FMC;Tone;UE functional use;Endurance    Cognitive Skills  Memory    Rehab Potential  Good    Comorbidities impacting occupational performance description:  essential tremor, HTN    OT Frequency  1x / week    OT Duration  4 weeks   plus eval   OT Treatment/Interventions  Self-care/ADL training;Therapeutic exercise;Functional Mobility Training;Aquatic Therapy;Neuromuscular education;Manual Therapy;Therapeutic activities;DME and/or AE instruction;Cognitive remediation/compensation;Visual/perceptual remediation/compensation;Passive range of motion;Patient/family education    Plan  d/c OT    Consulted and Agree with Plan of Care  Patient       Patient will benefit from skilled therapeutic intervention in order to improve the following deficits and impairments:   Body Structure / Function / Physical Skills:  ROM, IADL, Strength, Coordination, FMC, Tone, UE functional use, Endurance Cognitive Skills: Memory     Visit Diagnosis: Other lack of coordination  Muscle weakness (generalized)  Other symptoms and signs involving cognitive functions following cerebral infarction  Other abnormalities of gait and mobility    Problem List Patient Active Problem  List   Diagnosis Date Noted  . Shoulder pain 07/06/2019  . Hemorrhagic stroke ([place]) 03/28/2019  . Hypertensive crisis 03/23/2019  . HTN (hypertension), malignant 03/23/2019  . Subacute intracranial hemorrhage ([place])   . Hypertensive urgency   . Right leg weakness 01/10/2019  . Right ankle swelling 07/12/2018  . Tremor, essential 02/27/2014  . Routine health maintenance 01/11/2013  . History of colonic polyps 04/10/2010  . 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 Hyperlipidemia associated with type 2 diabetes mellitus ([place]) 11/17/2007  . Diabetes mellitus type 2 with complications ([place]) 45/99/7741  . IMPOTENCE 11/16/2007  . Essential hypertension, malignant 11/16/2007  . HEMORRHOIDS 11/16/2007    OCCUPATIONAL THERAPY DISCHARGE SUMMARY  Visits from Start of Care: 4  Current functional level related to goals / functional outcomes: See above   Remaining deficits: decr shoulder ROM/stiffness and mild pain (2/10), difficulty writing (improved with compensations), ET, cognitive deficits.   Education / Equipment: Pt instructed in ADL compensations (including strategies for writing, memory compensation), Ways to encourage thinking skills, HEP.  Pt verbalized understanding of all education provided.  Plan: Patient agrees to discharge.  Patient goals were met. Patient is being discharged due to being pleased with the current functional level.  ?????       [facility] 07/24/2019, 10:02 AM  [facility] [address] [place], [place], [address] Phone: [phone]    Fax:  [phone]  Name: Drew Harrington MRN: [id number] Date of Birth: 05/21/1946   Vianne Bulls, OTR/L [facility] [address] [place], [place]  [address] [phone] phone [phone] 07/24/19 10:02 AM

## 2019-07-26 DIAGNOSIS — E119 Type 2 diabetes mellitus without complications: Secondary | ICD-10-CM | POA: Diagnosis not present

## 2019-07-26 DIAGNOSIS — H524 Presbyopia: Secondary | ICD-10-CM | POA: Diagnosis not present

## 2019-07-26 DIAGNOSIS — H52223 Regular astigmatism, bilateral: Secondary | ICD-10-CM | POA: Diagnosis not present

## 2019-07-26 LAB — HM DIABETES EYE EXAM

## 2019-08-09 ENCOUNTER — Other Ambulatory Visit: Payer: Self-pay | Admitting: Internal Medicine

## 2019-09-03 ENCOUNTER — Other Ambulatory Visit: Payer: Self-pay

## 2019-09-03 ENCOUNTER — Ambulatory Visit: Payer: Medicare Other | Admitting: Family Medicine

## 2019-09-03 ENCOUNTER — Encounter: Payer: Self-pay | Admitting: Family Medicine

## 2019-09-03 VITALS — BP 148/96 | HR 79 | Ht 69.0 in | Wt 183.8 lb

## 2019-09-03 DIAGNOSIS — L853 Xerosis cutis: Secondary | ICD-10-CM

## 2019-09-03 DIAGNOSIS — M7502 Adhesive capsulitis of left shoulder: Secondary | ICD-10-CM | POA: Diagnosis not present

## 2019-09-03 DIAGNOSIS — M7501 Adhesive capsulitis of right shoulder: Secondary | ICD-10-CM

## 2019-09-03 MED ORDER — TRIAMCINOLONE 0.1 % CREAM:EUCERIN CREAM 1:1: 1.0000 "application " | TOPICAL_CREAM | Freq: Two times a day (BID) | CUTANEOUS | 1 refills | Status: DC | PRN

## 2019-09-03 NOTE — Patient Instructions (Signed)
Thank you for coming in today. Keep working on the home exercise.  Recheck with me as needed.  Use the Eucerin cream with triamcinolone for the dry skin rash as needed.

## 2019-09-03 NOTE — Progress Notes (Signed)
   I, Wendy Poet, LAT, ATC, am serving as scribe for Dr. Lynne Leader.  Drew Harrington is a 74 y.o. male who presents to [place] at [facility] today for f/u of B shoulder pain.  He was last seen by Dr. Georgina Snell on 07/20/19 and had a L shoulder injection.  He had a prior R shoulder injection on 07/06/19.  He has been referred to outpatient PT and completed 5 sessions.  Since his last visit, pt reports that both shoulders are feeling better than before.  He states that he has been able to control the pain fairly well w/ OTC meds like Advil.  He denies any pain today.    Diagnostic testing: B shoulder XR-07/06/19  He does note that he has some itchy dry skin on his forearms.  He notes this is been worse in the last few weeks with the cold dry air.  He is using over-the-counter lotion which is not sufficient.  He denies any new medications or exposures.  Pertinent review of systems: No fevers or chills.  Relevant historical information: History CVA.   Exam:  BP (!) 148/96 (BP Location: Left Arm, Patient Position: Sitting, Cuff Size: Large)   Pulse 79   Ht 5\' 9"  (1.753 m)   Wt 183 lb 12.8 oz (83.4 kg)   SpO2 96%   BMI 27.14 kg/m  General: Well Developed, well nourished, and in no acute distress.   MSK:  Shoulders bilaterally normal-appearing normal motion intact strength negative impingement testing.  Skin: Scaly slightly dry nonerythematous skin forearms bilaterally.  No other changes visible.      Assessment and Plan: 74 y.o. male with bilateral adhesive capsulitis status post steroid injection and physical therapy and home exercise program.  Doing much better.  Plan to continue home exercise program and recheck back with me as needed.  Dry skin: We will add triamcinolone to Eucerin cream.  If not better follow-up with PCP.   Meds ordered this encounter  Medications  . Triamcinolone Acetonide (TRIAMCINOLONE 0.1 % CREAM : EUCERIN) CREA    Sig: Apply 1  application topically 2 (two) times daily as needed. Disp 1 pound    Dispense:  1 each    Refill:  1     Discussed warning signs or symptoms. Please see discharge instructions. Patient expresses understanding.   The above documentation has been reviewed and is accurate and complete Lynne Leader

## 2019-09-16 ENCOUNTER — Other Ambulatory Visit: Payer: Self-pay | Admitting: Internal Medicine

## 2019-09-16 DIAGNOSIS — I10 Essential (primary) hypertension: Secondary | ICD-10-CM

## 2019-10-17 DIAGNOSIS — M25512 Pain in left shoulder: Secondary | ICD-10-CM | POA: Diagnosis not present

## 2019-10-17 DIAGNOSIS — M25511 Pain in right shoulder: Secondary | ICD-10-CM | POA: Diagnosis not present

## 2019-10-17 DIAGNOSIS — I639 Cerebral infarction, unspecified: Secondary | ICD-10-CM | POA: Diagnosis not present

## 2019-10-17 DIAGNOSIS — G250 Essential tremor: Secondary | ICD-10-CM | POA: Diagnosis not present

## 2019-10-29 ENCOUNTER — Other Ambulatory Visit: Payer: Self-pay

## 2019-10-29 ENCOUNTER — Ambulatory Visit (AMBULATORY_SURGERY_CENTER): Payer: Self-pay | Admitting: *Deleted

## 2019-10-29 VITALS — Temp 98.0°F | Ht 69.0 in | Wt 183.0 lb

## 2019-10-29 DIAGNOSIS — Z86010 Personal history of colonic polyps: Secondary | ICD-10-CM

## 2019-10-29 NOTE — Progress Notes (Signed)

## 2019-11-07 ENCOUNTER — Encounter: Payer: Self-pay | Admitting: Internal Medicine

## 2019-11-07 ENCOUNTER — Ambulatory Visit: Payer: Medicare Other | Admitting: Internal Medicine

## 2019-11-10 DIAGNOSIS — I639 Cerebral infarction, unspecified: Secondary | ICD-10-CM | POA: Diagnosis not present

## 2019-11-12 ENCOUNTER — Ambulatory Visit (AMBULATORY_SURGERY_CENTER): Payer: Medicare Other | Admitting: Internal Medicine

## 2019-11-12 ENCOUNTER — Ambulatory Visit: Payer: Medicare Other | Admitting: Internal Medicine

## 2019-11-12 ENCOUNTER — Encounter: Payer: Self-pay | Admitting: Internal Medicine

## 2019-11-12 ENCOUNTER — Other Ambulatory Visit: Payer: Self-pay

## 2019-11-12 VITALS — BP 191/115 | HR 58 | Temp 97.3°F | Resp 14 | Ht 69.0 in

## 2019-11-12 DIAGNOSIS — D124 Benign neoplasm of descending colon: Secondary | ICD-10-CM

## 2019-11-12 DIAGNOSIS — D123 Benign neoplasm of transverse colon: Secondary | ICD-10-CM | POA: Diagnosis not present

## 2019-11-12 DIAGNOSIS — Z86010 Personal history of colonic polyps: Secondary | ICD-10-CM | POA: Diagnosis not present

## 2019-11-12 DIAGNOSIS — D120 Benign neoplasm of cecum: Secondary | ICD-10-CM

## 2019-11-12 DIAGNOSIS — D127 Benign neoplasm of rectosigmoid junction: Secondary | ICD-10-CM | POA: Diagnosis not present

## 2019-11-12 DIAGNOSIS — Z1211 Encounter for screening for malignant neoplasm of colon: Secondary | ICD-10-CM | POA: Diagnosis not present

## 2019-11-12 MED ORDER — SODIUM CHLORIDE 0.9 % IV SOLN: 500.0000 mL | Freq: Once | INTRAVENOUS | Status: DC

## 2019-11-12 NOTE — Progress Notes (Signed)
CW- vitals LS- temp

## 2019-11-12 NOTE — Op Note (Signed)
[facility] Patient Name: Drew Harrington Procedure Date: 11/12/2019 11:51 AM MRN: [id number] Endoscopist: Gatha Mayer , MD Age: 74 Referring MD:  Date of Birth: [birth year] Gender: Male Account #: [id number] Procedure:                Colonoscopy Indications:              Surveillance: Personal history of adenomatous                            polyps on last colonoscopy 3 years ago Medicines:                Propofol per Anesthesia, Monitored Anesthesia Care Procedure:                Pre-Anesthesia Assessment:                           - Prior to the procedure, a History and Physical                            was performed, and patient medications and                            allergies were reviewed. The patient's tolerance of                            previous anesthesia was also reviewed. The risks                            and benefits of the procedure and the sedation                            options and risks were discussed with the patient.                            All questions were answered, and informed consent                            was obtained. Prior Anticoagulants: The patient has                            taken no previous anticoagulant or antiplatelet                            agents. ASA Grade Assessment: III - A patient with                            severe systemic disease. After reviewing the risks                            and benefits, the patient was deemed in                            satisfactory condition to undergo the procedure.  After obtaining informed consent, the colonoscope                            was passed under direct vision. Throughout the                            procedure, the patient's blood pressure, pulse, and                            oxygen saturations were monitored continuously. The                            Colonoscope was introduced through the anus and   advanced to the the cecum, identified by                            appendiceal orifice and ileocecal valve. The                            colonoscopy was performed without difficulty. The                            patient tolerated the procedure well. The quality                            of the bowel preparation was adequate. The                            ileocecal valve, appendiceal orifice, and rectum                            were photographed. The bowel preparation used was                            Miralax via split dose instruction. Scope In: 11:58:51 AM Scope Out: 12:19:35 PM Scope Withdrawal Time: 0 hours 11 minutes 32 seconds  Total Procedure Duration: 0 hours 20 minutes 44 seconds  Findings:                 The perianal and digital rectal examinations were                            normal. Pertinent negatives include normal prostate                            (size, shape, and consistency).                           A 8 to 10 mm polyp was found in the recto-sigmoid                            colon. The polyp was semi-pedunculated. The polyp                            was removed with  a hot snare. Resection and                            retrieval were complete. Verification of patient                            identification for the specimen was done. Estimated                            blood loss: none.                           Three sessile polyps were found in the descending                            colon, transverse colon and cecum. The polyps were                            diminutive in size. These polyps were removed with                            a cold snare. Resection and retrieval were                            complete. Verification of patient identification                            for the specimen was done. Estimated blood loss was                            minimal.                           Internal hemorrhoids were found.                            The exam was otherwise without abnormality on                            direct and retroflexion views. Complications:            No immediate complications. Estimated Blood Loss:     Estimated blood loss was minimal. Impression:               - One 8 to 10 mm polyp at the recto-sigmoid colon,                            removed with a hot snare. Resected and retrieved.                           - Three diminutive polyps in the descending colon,                            in the transverse colon and in the cecum, removed  with a cold snare. Resected and retrieved.                           - Internal hemorrhoids.                           - The examination was otherwise normal on direct                            and retroflexion views.                           - Personal history of colonic polyps. 2006 -                            diminutive adenoma                           03/2010 - Two 1cm polyps removed from the right                            colon TUBULAR ADENOMAS                           07/04/2013 5 polyps removed max 1 cm TUBULAR ADENOMAS                            09/15/2016 5 polyps max 10 mm all adenomas Recommendation:           - Patient has a contact number available for                            emergencies. The signs and symptoms of potential                            delayed complications were discussed with the                            patient. Return to normal activities tomorrow.                            Written discharge instructions were provided to the                            patient.                           - Resume previous diet.                           - Continue present medications.                           - No aspirin, ibuprofen, naproxen, or other                            non-steroidal  anti-inflammatory drugs for 2 weeks                            after polyp removal.                           - Repeat  colonoscopy is recommended for                            surveillance. The colonoscopy date will be                            determined after pathology results from today's                            exam become available for review. Gatha Mayer, MD 11/12/2019 12:32:47 PM This report has been signed electronically.

## 2019-11-12 NOTE — Progress Notes (Signed)
Pt's states no medical or surgical changes since previsit or office visit. 

## 2019-11-12 NOTE — Patient Instructions (Addendum)
I removed 4 polyps today. All look benign (do not look like cancer)  I will let you know pathology results and when/if to have another routine colonoscopy by mail and/or My Chart.  I appreciate the opportunity to care for you. Gatha Mayer, MD, [facility]  Thank you for letting us take care of your healthcare needs today. No aspirin, Ibuprofen NSAIDS for 2 weeks.  YOU HAD AN ENDOSCOPIC PROCEDURE TODAY AT [facility]:   Refer to the procedure report that was given to you for any specific questions about what was found during the examination.  If the procedure report does not answer your questions, please call your gastroenterologist to clarify.  If you requested that your care partner not be given the details of your procedure findings, then the procedure report has been included in a sealed envelope for you to review at your convenience later.  YOU SHOULD EXPECT: Some feelings of bloating in the abdomen. Passage of more gas than usual.  Walking can help get rid of the air that was put into your GI tract during the procedure and reduce the bloating. If you had a lower endoscopy (such as a colonoscopy or flexible sigmoidoscopy) you may notice spotting of blood in your stool or on the toilet paper. If you underwent a bowel prep for your procedure, you may not have a normal bowel movement for a few days.  Please Note:  You might notice some irritation and congestion in your nose or some drainage.  This is from the oxygen used during your procedure.  There is no need for concern and it should clear up in a day or so.  SYMPTOMS TO REPORT IMMEDIATELY:   Following lower endoscopy (colonoscopy or flexible sigmoidoscopy):  Excessive amounts of blood in the stool  Significant tenderness or worsening of abdominal pains  Swelling of the abdomen that is new, acute  Fever of 100F or higher   For urgent or emergent issues, a gastroenterologist can be reached at any hour by calling [phone]  [phone]. Do not use MyChart messaging for urgent concerns.    DIET:  We do recommend a small meal at first, but then you may proceed to your regular diet.  Drink plenty of fluids but you should avoid alcoholic beverages for 24 hours.  ACTIVITY:  You should plan to take it easy for the rest of today and you should NOT DRIVE or use heavy machinery until tomorrow (because of the sedation medicines used during the test).    FOLLOW UP: Our staff will call the number listed on your records 48-72 hours following your procedure to check on you and address any questions or concerns that you may have regarding the information given to you following your procedure. If we do not reach you, we will leave a message.  We will attempt to reach you two times.  During this call, we will ask if you have developed any symptoms of COVID 19. If you develop any symptoms (ie: fever, flu-like symptoms, shortness of breath, cough etc.) before then, please call [phone].  If you test positive for Covid 19 in the 2 weeks post procedure, please call and report this information to [place].    If any biopsies were taken you will be contacted by phone or by letter within the next 1-3 weeks.  Please call us at [phone] if you have not heard about the biopsies in 3 weeks.    SIGNATURES/CONFIDENTIALITY: You and/or your care partner have  signed paperwork which will be entered into your electronic medical record.  These signatures attest to the fact that that the information above on your After Visit Summary has been reviewed and is understood.  Full responsibility of the confidentiality of this discharge information lies with you and/or your care-partner.

## 2019-11-12 NOTE — Progress Notes (Signed)
To PACU, VSS. Report to [email or web address] 

## 2019-11-12 NOTE — Progress Notes (Signed)
Called to room to assist during endoscopic procedure.  Patient ID and intended procedure confirmed with present staff. Received instructions for my participation in the procedure from the performing physician.  

## 2019-11-13 ENCOUNTER — Ambulatory Visit (INDEPENDENT_AMBULATORY_CARE_PROVIDER_SITE_OTHER): Payer: Medicare Other | Admitting: Internal Medicine

## 2019-11-13 ENCOUNTER — Encounter: Payer: Self-pay | Admitting: Internal Medicine

## 2019-11-13 VITALS — BP 144/86 | HR 76 | Temp 98.9°F | Ht 69.0 in | Wt 184.0 lb

## 2019-11-13 DIAGNOSIS — I10 Essential (primary) hypertension: Secondary | ICD-10-CM

## 2019-11-13 DIAGNOSIS — E785 Hyperlipidemia, unspecified: Secondary | ICD-10-CM

## 2019-11-13 DIAGNOSIS — E118 Type 2 diabetes mellitus with unspecified complications: Secondary | ICD-10-CM

## 2019-11-13 DIAGNOSIS — E1169 Type 2 diabetes mellitus with other specified complication: Secondary | ICD-10-CM

## 2019-11-13 DIAGNOSIS — I69359 Hemiplegia and hemiparesis following cerebral infarction affecting unspecified side: Secondary | ICD-10-CM | POA: Diagnosis not present

## 2019-11-13 LAB — COMPREHENSIVE METABOLIC PANEL
ALT: 24 U/L (ref 0–53)
AST: 18 U/L (ref 0–37)
Albumin: 4.2 g/dL (ref 3.5–5.2)
Alkaline Phosphatase: 182 U/L — ABNORMAL HIGH (ref 39–117)
BUN: 13 mg/dL (ref 6–23)
CO2: 32 mEq/L (ref 19–32)
Calcium: 9.8 mg/dL (ref 8.4–10.5)
Chloride: 101 mEq/L (ref 96–112)
Creatinine, Ser: 1.05 mg/dL (ref 0.40–1.50)
GFR: 83.39 mL/min (ref >60.00)
Glucose, Bld: 113 mg/dL — ABNORMAL HIGH (ref 70–99)
Potassium: 3.5 mEq/L (ref 3.5–5.1)
Sodium: 139 mEq/L (ref 135–145)
Total Bilirubin: 0.3 mg/dL (ref 0.2–1.2)
Total Protein: 7.5 g/dL (ref 6.0–8.3)

## 2019-11-13 LAB — CBC
HCT: 47.4 % (ref 39.0–52.0)
Hemoglobin: 16 g/dL (ref 13.0–17.0)
MCHC: 33.8 g/dL (ref 30.0–36.0)
MCV: 100.5 fl — ABNORMAL HIGH (ref 78.0–100.0)
Platelets: 254 10*3/uL (ref 150.0–400.0)
RBC: 4.71 Mil/uL (ref 4.22–5.81)
RDW: 15.6 % — ABNORMAL HIGH (ref 11.5–15.5)
WBC: 8.3 10*3/uL (ref 4.0–10.5)

## 2019-11-13 LAB — LIPID PANEL
Cholesterol: 212 mg/dL — ABNORMAL HIGH (ref 0–200)
HDL: 56.1 mg/dL (ref >39.00)
LDL Cholesterol: 132 mg/dL — ABNORMAL HIGH (ref 0–99)
NonHDL: 155.74
Total CHOL/HDL Ratio: 4
Triglycerides: 120 mg/dL (ref 0.0–149.0)
VLDL: 24 mg/dL (ref 0.0–40.0)

## 2019-11-13 LAB — HEMOGLOBIN A1C: Hgb A1c MFr Bld: 5.9 % (ref 4.6–6.5)

## 2019-11-13 NOTE — Patient Instructions (Signed)
We will check the blood work today.

## 2019-11-13 NOTE — Progress Notes (Signed)
   Subjective:   Patient ID: Drew Harrington, male    DOB: 03-26-1946, 74 y.o.   MRN: [id number]  HPI The patient is a 74 YO man coming in for concerns about right arm/leg swelling/weakness from stroke (stable symptoms since last fall with onset of left stroke, consistent symptoms and still with weakness right arm and swelling and weakness right leg, does drag foot when walking some right leg, also having to lift right foot into and out of car, recent visit with neurology and MRI done 2 days ago without signs of new stroke) and diabetes (taking metformin BID, on ARB and statin, denies new numbness or tingling, does have past stroke no new symptoms) and blood pressure (BP elevated at recent neurology visit, taking amlodipine and hydralazine and losartan/hctz and propranolol, denies chest pains or headaches).   Review of Systems  Constitutional: Negative.   HENT: Negative.   Eyes: Negative.   Respiratory: Negative for cough, chest tightness and shortness of breath.   Cardiovascular: Negative for chest pain, palpitations and leg swelling.  Gastrointestinal: Negative for abdominal distention, abdominal pain, constipation, diarrhea, nausea and vomiting.  Musculoskeletal: Positive for gait problem.  Skin: Negative.   Neurological: Positive for speech difficulty, weakness and numbness.  Psychiatric/Behavioral: Negative.     Objective:  Physical Exam Constitutional:      Appearance: He is well-developed.  HENT:     Head: Normocephalic and atraumatic.  Cardiovascular:     Rate and Rhythm: Normal rate and regular rhythm.  Pulmonary:     Effort: Pulmonary effort is normal. No respiratory distress.     Breath sounds: Normal breath sounds. No wheezing or rales.  Abdominal:     General: Bowel sounds are normal. There is no distension.     Palpations: Abdomen is soft.     Tenderness: There is no abdominal tenderness. There is no rebound.  Musculoskeletal:     Cervical back: Normal range of  motion.  Skin:    General: Skin is warm and dry.  Neurological:     Mental Status: He is alert and oriented to person, place, and time. Mental status is at baseline.     Cranial Nerves: Cranial nerve deficit present.     Coordination: Coordination normal.     Vitals:   11/13/19 1352  BP: (!) 144/86  Pulse: 76  Temp: 98.9 F (37.2 C)  SpO2: 98%  Weight: 184 lb (83.5 kg)  Height: 5\' 9"  (1.753 m)    This visit occurred during the SARS-CoV-2 public health emergency.  Safety protocols were in place, including screening questions prior to the visit, additional usage of staff PPE, and extensive cleaning of exam room while observing appropriate contact time as indicated for disinfecting solutions.   Assessment & Plan:

## 2019-11-13 NOTE — Assessment & Plan Note (Signed)
BP close to goal although he is unsure if he is taking propranolol now. Advised him to check medication list from today and make sure he is taking all his medications for BP including amlodipine, propranolol, losartan/hctz, hydralazine. Checking CMP and adjust as needed.

## 2019-11-13 NOTE — Assessment & Plan Note (Signed)
Stable symptoms almost 9-10 months out from stroke without likelihood for significant improvement. Will likely not be able to return to his former job. Recent MRI through neurology without new stroke. BP close to goal. Checking HgA1c for DM control. Counseled again about multiple prior past strokes on MRI from last year and recently.

## 2019-11-13 NOTE — Assessment & Plan Note (Signed)
Foot exam done, checking HgA1c. Adjust metformin as needed. On ARB and statin. Checking lipid panel as well. Reminded about need for yearly eye exam. Complicated by prior stroke.

## 2019-11-13 NOTE — Assessment & Plan Note (Signed)
Checking lipid panel and adjust simvastatin 20 mg daily as needed.  

## 2019-11-14 ENCOUNTER — Telehealth: Payer: Self-pay | Admitting: *Deleted

## 2019-11-14 NOTE — Progress Notes (Signed)
the cholesterol is higher than his goal and we should increase the dose of his simvastatin if he is taking this regularly? If he is not taking regularly needs to resume and follow up 3 months for recheck.

## 2019-11-14 NOTE — Telephone Encounter (Signed)
  Follow up Call-  Call back number 11/12/2019  Post procedure Call Back phone  # [phone]  Permission to leave phone message Yes  Some recent data might be hidden     Patient questions:  Do you have a fever, pain , or abdominal swelling? No. Pain Score  0 *  Have you tolerated food without any problems? Yes.    Have you been able to return to your normal activities? Yes.    Do you have any questions about your discharge instructions: Diet   No. Medications  No. Follow up visit  No.  Do you have questions or concerns about your Care? No.  Actions: * If pain score is 4 or above: No action needed, pain <4.  1. Have you developed a fever since your procedure? no  2.   Have you had an respiratory symptoms (SOB or cough) since your procedure? no  3.   Have you tested positive for COVID 19 since your procedure no  4.   Have you had any family members/close contacts diagnosed with the COVID 19 since your procedure?  no   If yes to any of these questions please route to Joylene John, RN and Erenest Rasher, RN

## 2019-11-22 ENCOUNTER — Encounter: Payer: Self-pay | Admitting: Internal Medicine

## 2019-11-22 DIAGNOSIS — Z86010 Personal history of colonic polyps: Secondary | ICD-10-CM

## 2019-11-29 ENCOUNTER — Telehealth: Payer: Self-pay | Admitting: Internal Medicine

## 2019-11-29 NOTE — Progress Notes (Signed)
°  Chronic Care Management   Outreach Note  11/29/2019 Name: Drew Harrington MRN: [id number] DOB: 04-04-1946  Referred by: Hoyt Koch, MD Reason for referral : No chief complaint on file.   An unsuccessful telephone outreach was attempted today. The patient was referred to the pharmacist for assistance with care management and care coordination.   This note is not being shared with the patient for the following reason: To respect privacy (The patient or proxy has requested that the information not be shared).  Follow Up Plan:   Earney Hamburg Upstream Scheduler

## 2019-12-07 ENCOUNTER — Telehealth: Payer: Self-pay | Admitting: Internal Medicine

## 2019-12-07 NOTE — Telephone Encounter (Signed)
I received renewal disability forms from [employer].  LOV : 11/13/19  Forms have been completed &Placed in providers box to review and sign.

## 2019-12-10 ENCOUNTER — Telehealth: Payer: Self-pay | Admitting: Internal Medicine

## 2019-12-10 NOTE — Progress Notes (Signed)
  Chronic Care Management   Outreach Note  12/10/2019 Name: VASHAUN OSMON MRN: [id number] DOB: 1946-06-09  Referred by: Hoyt Koch, MD Reason for referral : No chief complaint on file.   An unsuccessful telephone outreach was attempted today. The patient was referred to the pharmacist for assistance with care management and care coordination. This note is not being shared with the patient for the following reason: To respect privacy (The patient or proxy has requested that the information not be shared).  Follow Up Plan:   Earney Hamburg Upstream Scheduler

## 2019-12-12 NOTE — Telephone Encounter (Signed)
Forms have been signed, Faxed, Copy sent to scan.  Patient informed and Original mailed to patient for his records.

## 2019-12-24 ENCOUNTER — Other Ambulatory Visit: Payer: Self-pay | Admitting: Internal Medicine

## 2019-12-25 ENCOUNTER — Other Ambulatory Visit: Payer: Self-pay | Admitting: Internal Medicine

## 2019-12-25 DIAGNOSIS — E119 Type 2 diabetes mellitus without complications: Secondary | ICD-10-CM

## 2019-12-26 ENCOUNTER — Telehealth: Payer: Self-pay | Admitting: Internal Medicine

## 2019-12-26 NOTE — Progress Notes (Signed)
  Chronic Care Management   Note  12/26/2019 Name: Drew Harrington MRN: [id number] DOB: Jan 27, 1946  Drew Harrington is a 74 y.o. year old male who is a primary care patient of Hoyt Koch, MD. I reached out to [name] by phone today in response to a referral sent by [name]'s PCP, Hoyt Koch, MD.   [name] was given information about Chronic Care Management services today including:  1. CCM service includes personalized support from designated clinical staff supervised by his physician, including individualized plan of care and coordination with other care providers 2. 24/7 contact phone numbers for assistance for urgent and routine care needs. 3. Service will only be billed when office clinical staff spend 20 minutes or more in a month to coordinate care. 4. Only one practitioner may furnish and bill the service in a calendar month. 5. The patient may stop CCM services at any time (effective at the end of the month) by phone call to the office staff.   Patient agreed to services and verbal consent obtained.  This note is not being shared with the patient for the following reason: To respect privacy (The patient or proxy has requested that the information not be shared).  Follow up plan:   Earney Hamburg Upstream Scheduler

## 2020-02-19 IMAGING — DX DG SHOULDER 2+V*R*
3 series · 3 of 3 positions shown · non-contrast
Comparison: No prior.

CLINICAL DATA: Generalized pain.

EXAM:
RIGHT SHOULDER - 2+ VIEW

[shoulder (grashey view) ap]
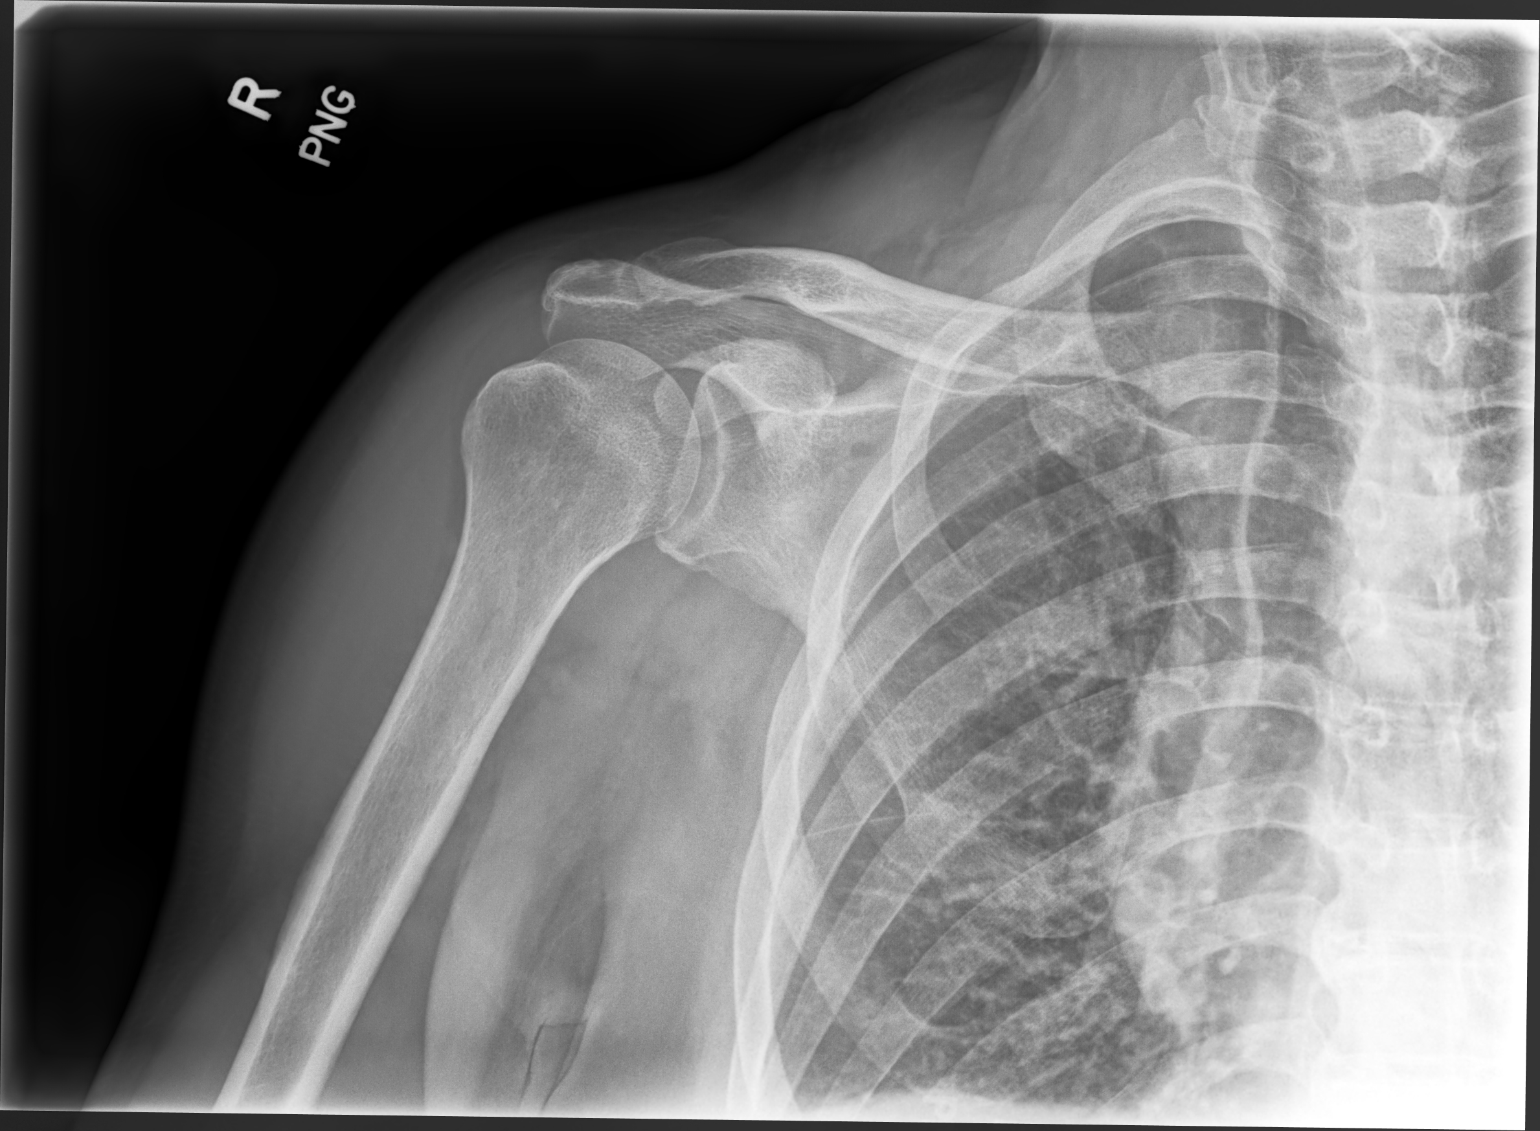

[shoulder (y view) lat]
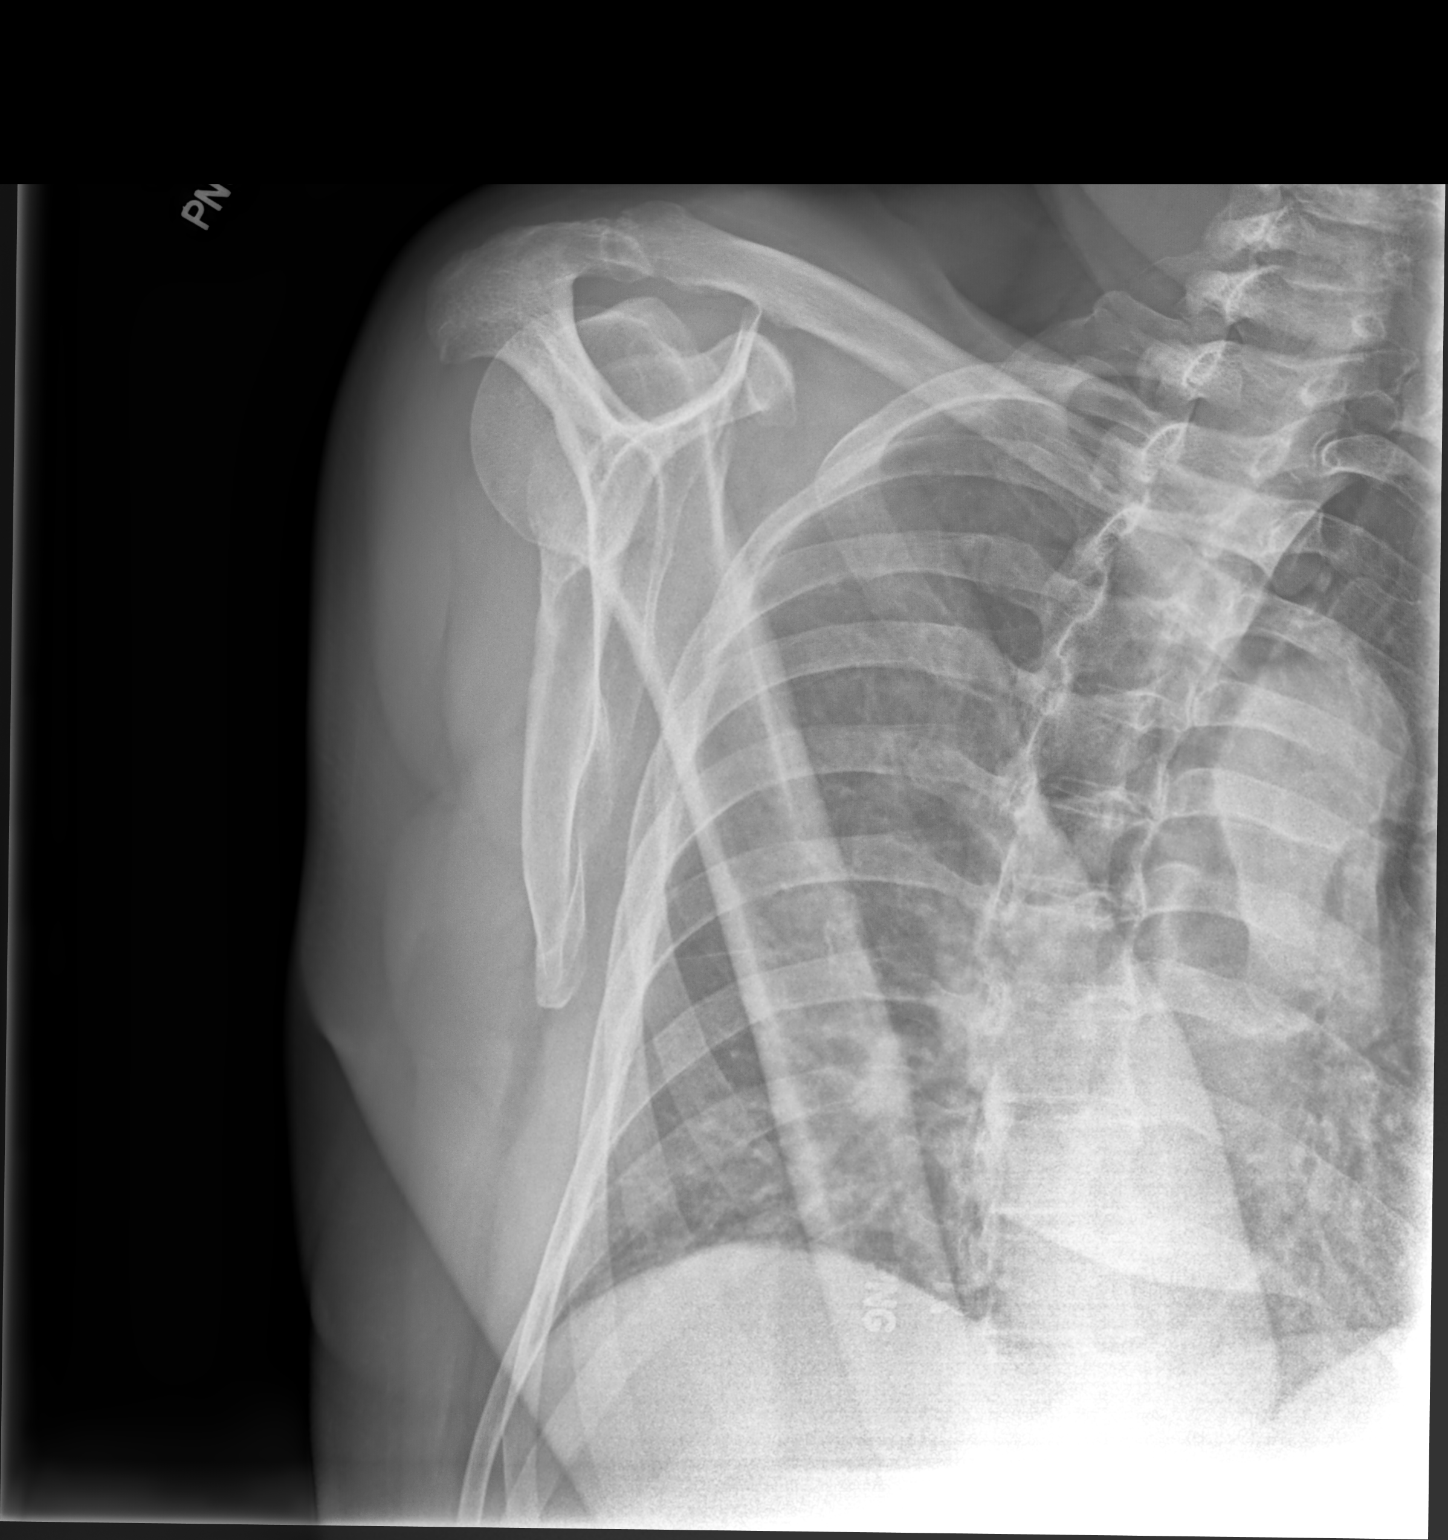

[shoulder axial 45°]
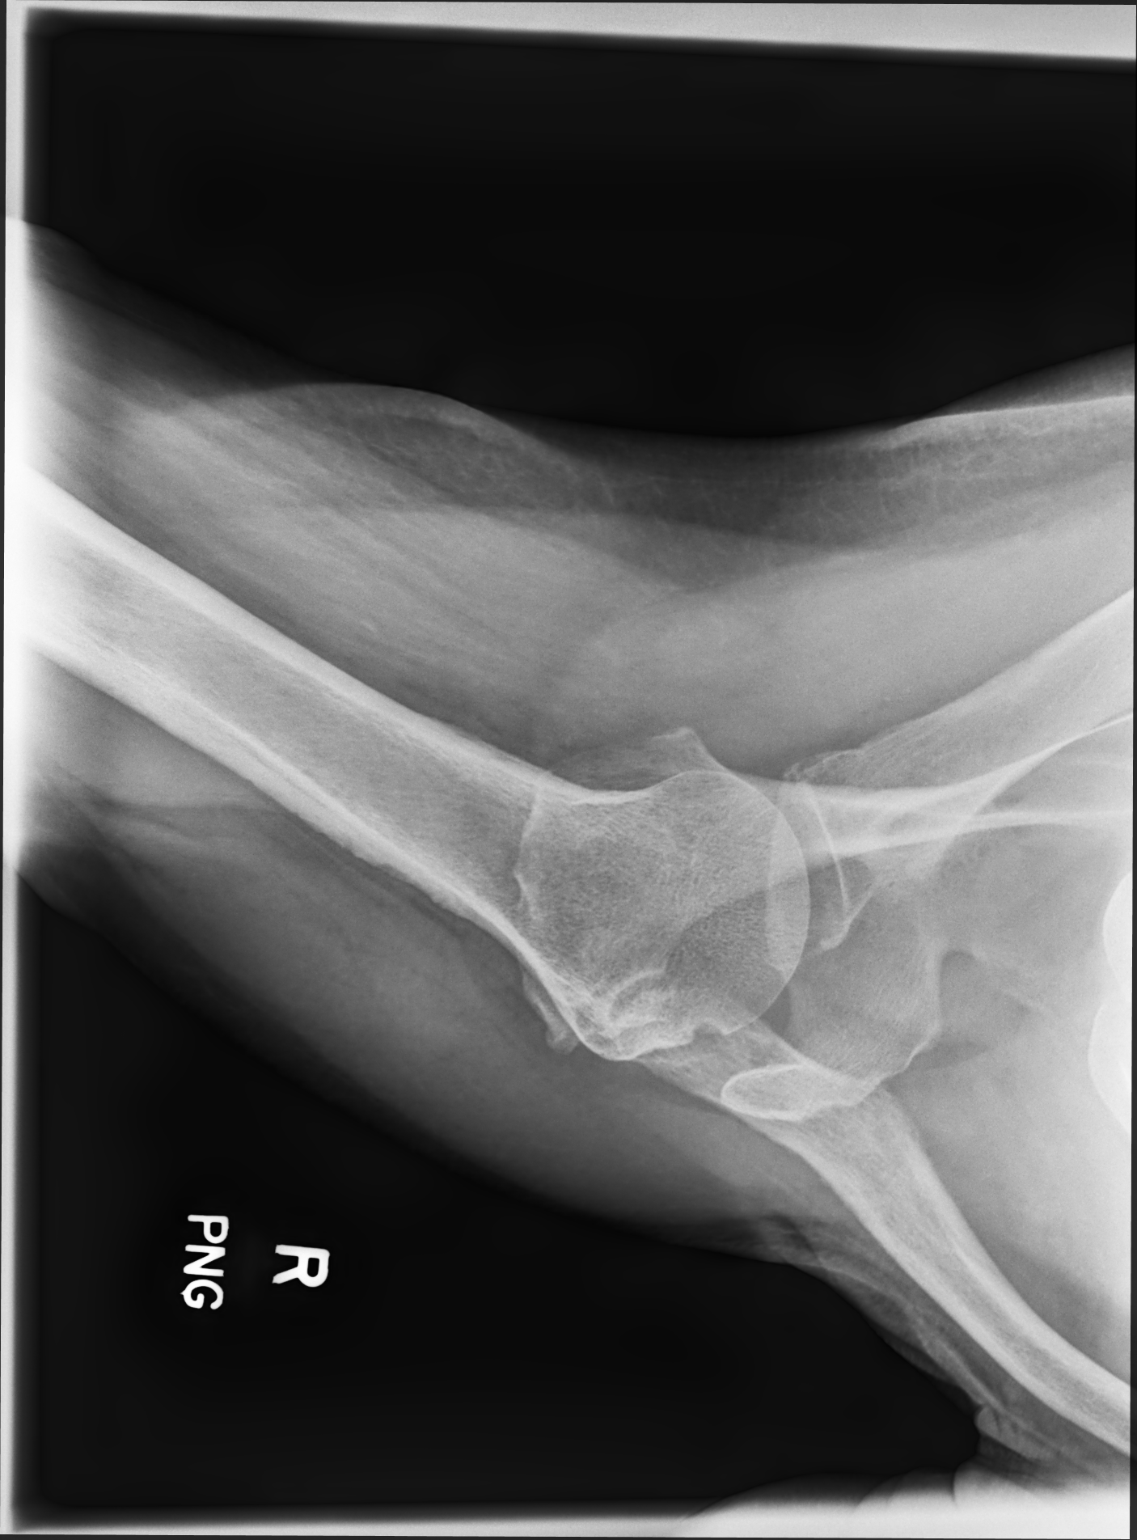

[3 of 3 positions shown; findings below may reference images not displayed]

FINDINGS: Acromioclavicular and glenohumeral degenerative change. No evidence
of fracture or dislocation. No evidence of separation.
IMPRESSION: Acromioclavicular glenohumeral degenerative change. No acute
abnormality.

## 2020-02-19 IMAGING — DX DG SHOULDER 2+V*L*
3 series · 3 of 3 positions shown · non-contrast
Comparison: None.

CLINICAL DATA: Generalized pain

EXAM:
LEFT SHOULDER - 2+ VIEW

[shoulder (grashey view) ap]
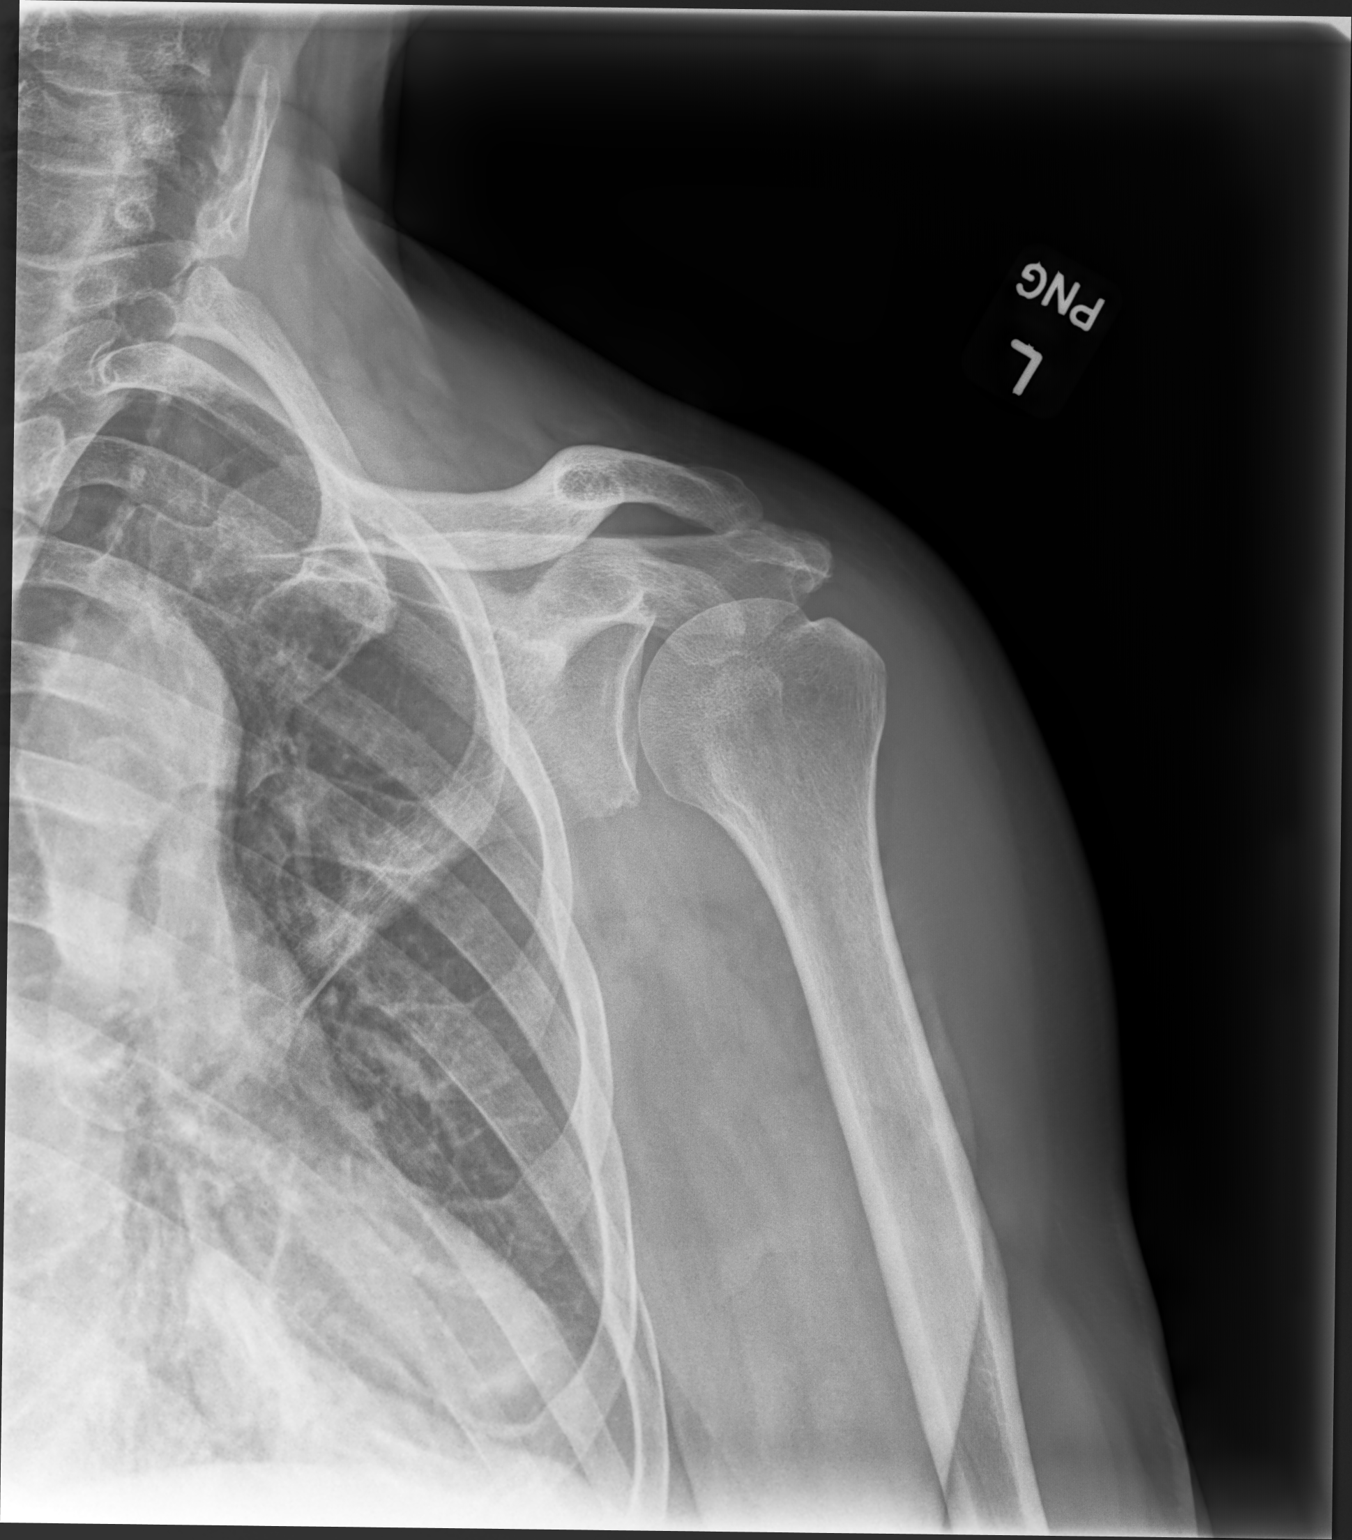

[shoulder (y view) lat]
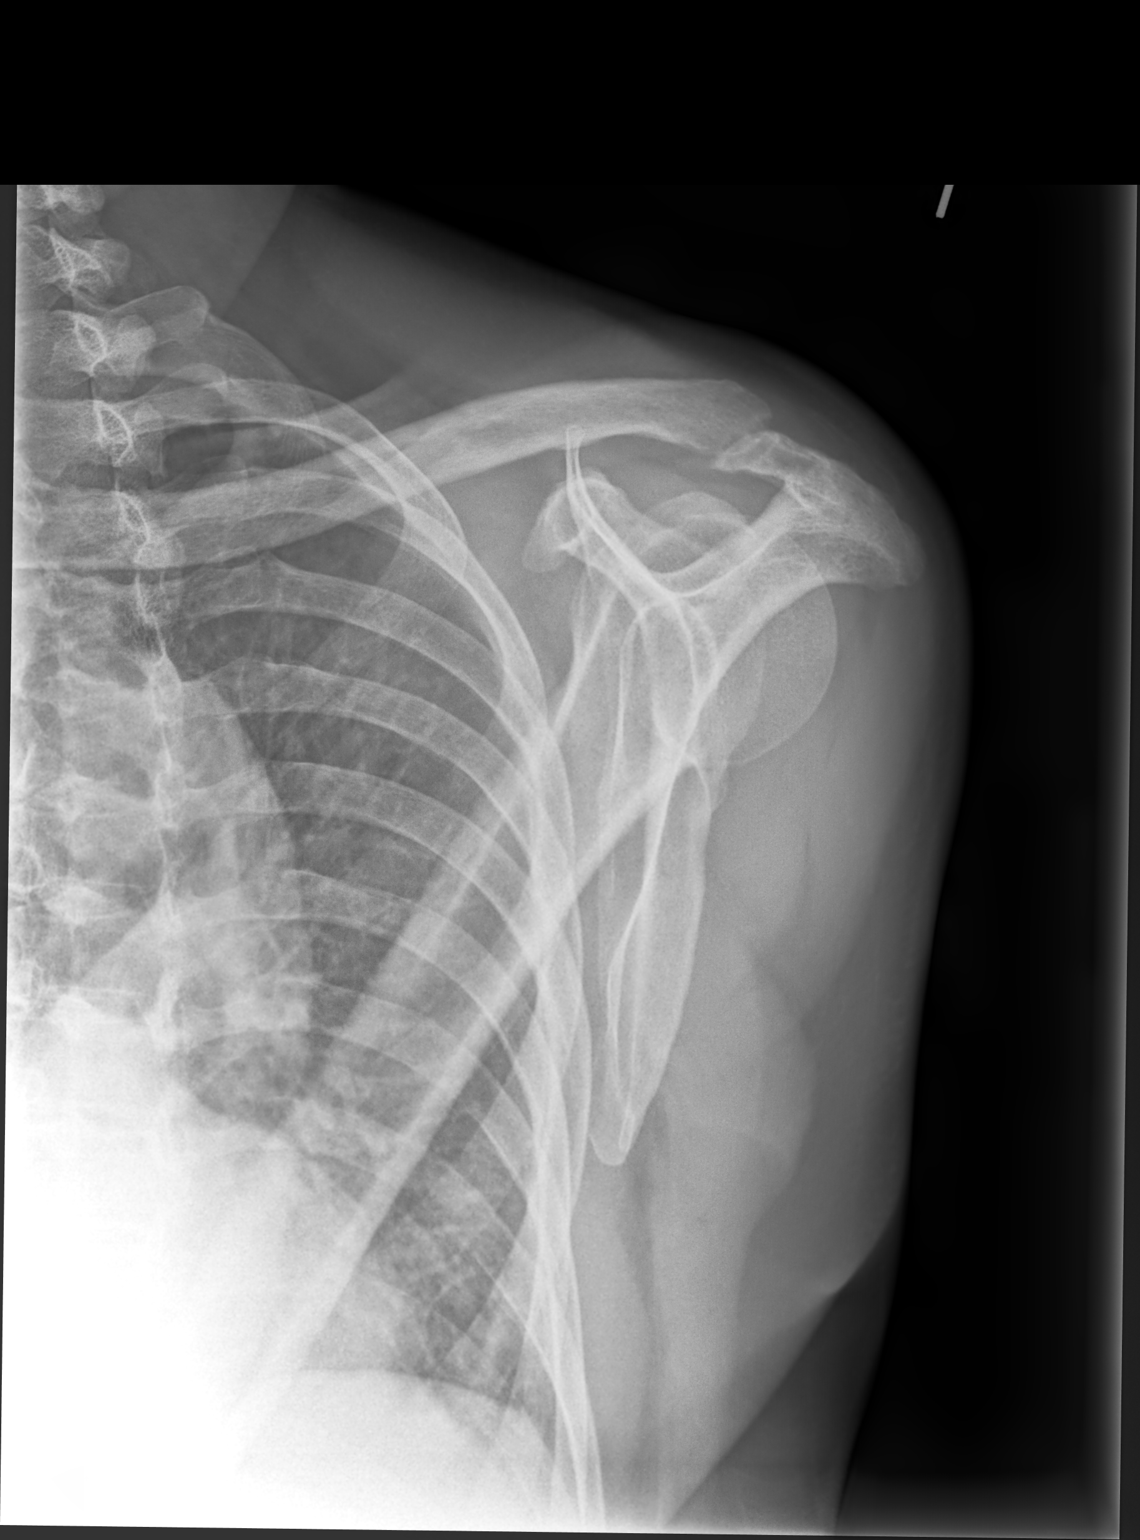

[shoulder axial 45°]
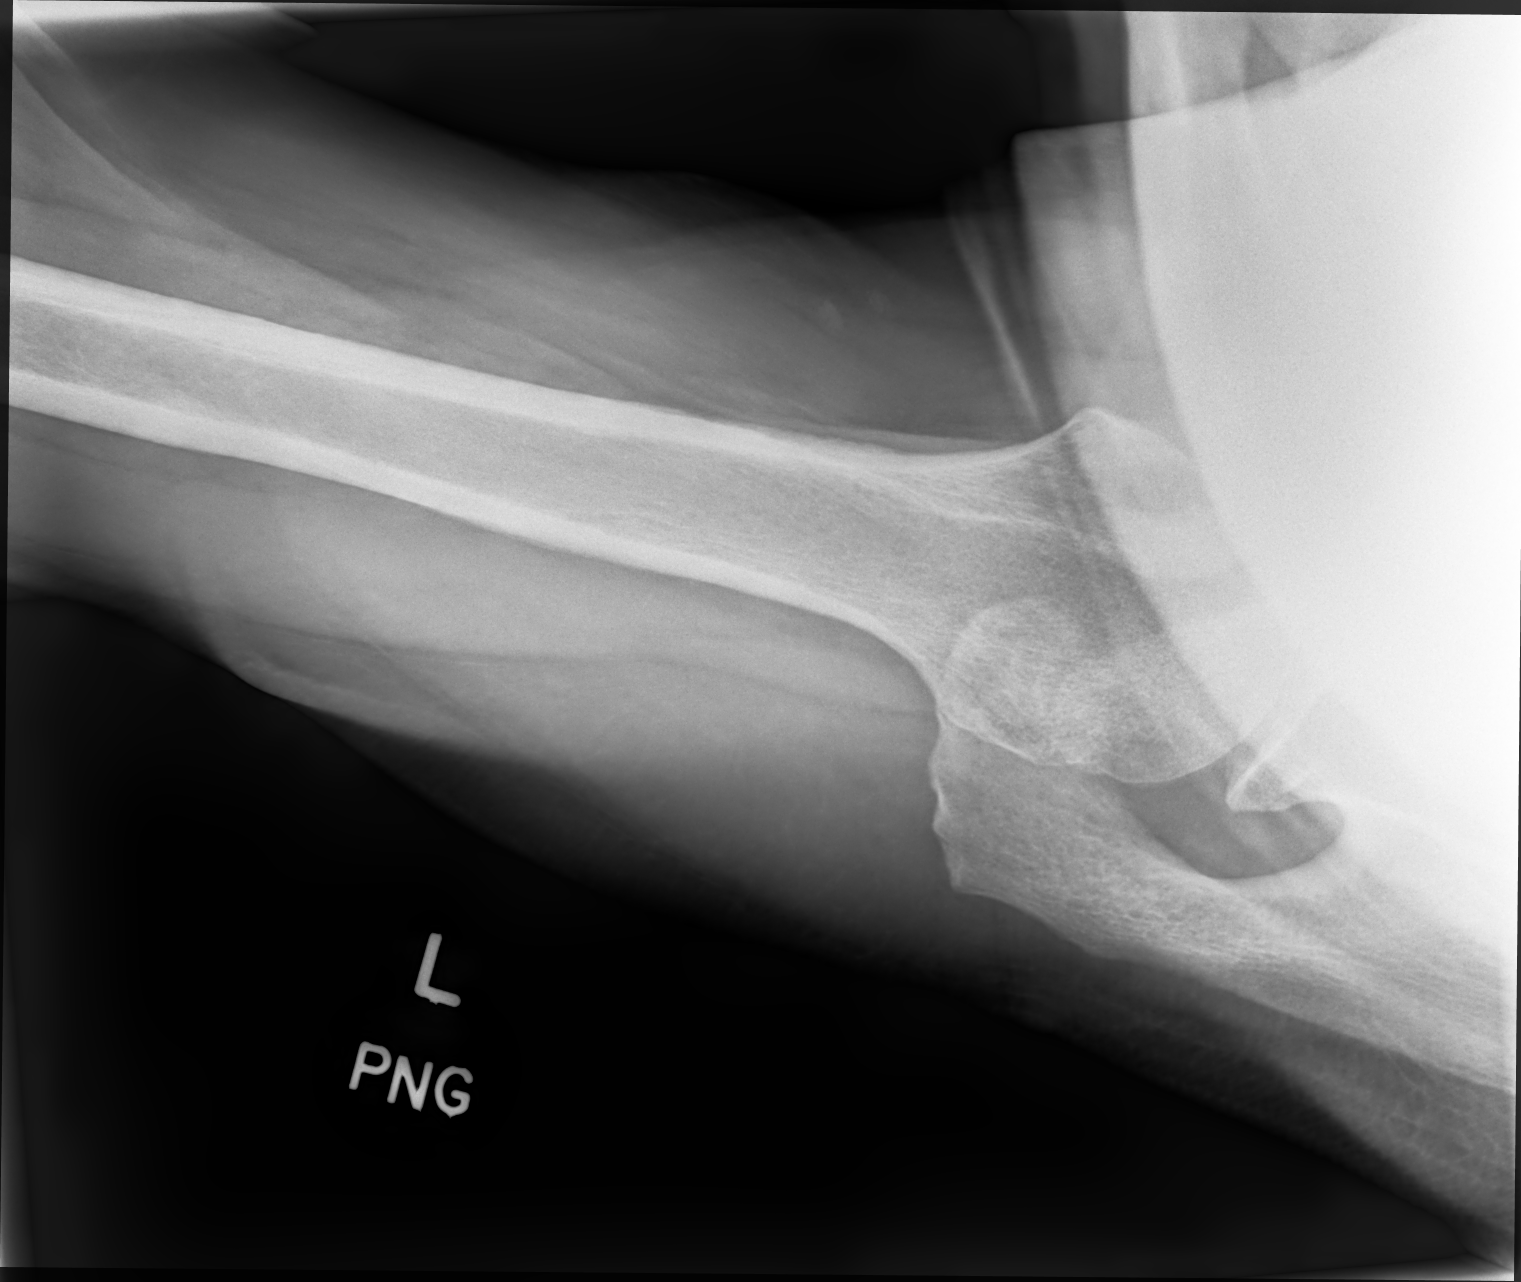

[3 of 3 positions shown; findings below may reference images not displayed]

FINDINGS: Degenerative changes in the AC joint with joint space narrowing and
spurring. Glenohumeral joint is maintained. No acute bony
abnormality. Specifically, no fracture, subluxation, or dislocation.
Soft tissues are intact.
IMPRESSION: Mild degenerative changes in the left AC joint. No acute bony
abnormality.

## 2020-03-14 ENCOUNTER — Telehealth: Payer: Self-pay | Admitting: Pharmacist

## 2020-03-14 NOTE — Progress Notes (Signed)
Chronic Care Management Pharmacy Assistant   Name: Drew Harrington  MRN: [id number] DOB: 1945/12/02  Reason for Encounter: Initial Questions   Patient Questions:  1.  Have you seen any other providers since your last visit? No  2.  Any changes in your medicines or health? No   Drew Harrington,  74 y.o. , male presents for their Initial CCM visit with the clinical pharmacist In office.  PCP : Drew Koch, MD  Allergies:   Allergies  Allergen Reactions  . Sildenafil Other (See Comments)    Caused a headache    Medications: Outpatient Encounter Medications as of 03/14/2020  Medication Sig Note  . amLODipine (NORVASC) 10 MG tablet TAKE 1 TABLET(10 MG) BY MOUTH DAILY   . hydrALAZINE (APRESOLINE) 25 MG tablet Take 1 tablet (25 mg total) by mouth 3 (three) times daily.   Marland Kitchen losartan-hydrochlorothiazide (HYZAAR) 100-25 MG tablet Take 1 tablet by mouth daily. Annual appt due in Sept must see provider for future refills   . metFORMIN (GLUCOPHAGE) 1000 MG tablet TAKE 1 TABLET(1000 MG) BY MOUTH TWICE DAILY WITH A MEAL   . Multiple Vitamins-Minerals (CENTRUM SILVER PO) Take 1 tablet by mouth 2 (two) times a week.    . naproxen sodium (ALEVE) 220 MG tablet Take 220-440 mg by mouth 2 (two) times daily as needed (for pain or headaches).   . potassium chloride SA (KLOR-CON) 20 MEQ tablet Take 1 tablet (20 mEq total) by mouth once for 1 dose. Annual appt due in Sept must see provider for future refills   . primidone (MYSOLINE) 250 MG tablet Take 750 mg by mouth See admin instructions. Take 750 mg by mouth in the morning 03/22/2019: LF a 90-day's supply on 11/07/2018, per [facility]  . propranolol ER (INDERAL LA) 120 MG 24 hr capsule TAKE 1 CAPSULE(120 MG) BY MOUTH DAILY (Patient not taking: Reported on 11/13/2019)   . simvastatin (ZOCOR) 20 MG tablet TAKE 1 TABLET(20 MG) BY MOUTH AT BEDTIME   . Triamcinolone Acetonide (TRIAMCINOLONE 0.1 % CREAM : EUCERIN) CREA Apply 1 application  topically 2 (two) times daily as needed. Disp 1 pound    No facility-administered encounter medications on file as of 03/14/2020.    Current Diagnosis: Patient Active Problem List   Diagnosis Date Noted  . Shoulder pain 07/06/2019  . Hemiplegia as late effect of stroke ([place]) 03/28/2019  . Tremor, essential 02/27/2014  . Routine health maintenance 01/11/2013  . History of colonic polyps 04/10/2010  . 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 Hyperlipidemia associated with type 2 diabetes mellitus ([place]) 11/17/2007  . Diabetes mellitus type 2 with complications ([place]) 96/29/5284  . IMPOTENCE 11/16/2007  . Essential hypertension, malignant 11/16/2007  . HEMORRHOIDS 11/16/2007    Goals Addressed   None     Follow-Up:  Pharmacist Review      Have you seen any other providers since your last visit? No Any changes in your medications or health? no Any side effects from any medications? no Do you have an symptoms or problems not managed by your medications? no Any concerns about your health right now? Yes, The patient stated that he has been having pain in in his right arm he did suffer a stroke and he got an injection in his are a month ago from his Orthro physician which normally last him 3 weeks. But he stated that he is still unable to lift a gallon of milk.  Has your provider asked that you check blood  pressure, blood sugar, or follow special diet at home? Yes the patient checks his blood pressure daily in the evening last night 9/16 it was 145/90. Do you get any type of exercise on a regular basis? Yes, he would like to know if there are any exercises he can do to help strengthen his right arm at home.  Can you think of a goal you would like to reach for your health? Yes, the patients main concern  Is his right arm pain.  Do you have any problems getting your medications? no Is there anything that you would like to discuss during the appointment? Possible physical therapy for his  arm.    Please bring medications and supplements to appointment   [name], [place] Pharmacist Assistant  [phone]

## 2020-03-14 NOTE — Chronic Care Management (AMB) (Signed)
Chronic Care Management Pharmacy  Name: Drew Harrington  MRN: [id number] DOB: 1946-05-08   Chief Complaint/ HPI  Drew Harrington,  74 y.o. , male presents for their Initial CCM visit with the clinical pharmacist via telephone due to COVID-19 Pandemic.  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 He worked up until his stroke in Fall 2020. He lives with his wife of 49 years. Since his stroke he stays home most days, watching TV and tending to household. He used to walk outside often but stopped after stroke due to unsteadiness, he feels he is doing better now and wants to start walking again. He does not use anything to assist with movement.  Office Visits: 11/13/19 Dr [name] OV: R arm/leg weakness from stroke (Sept 2020). BP close to goal but pt unsure if he is taking propranolol. Cholesterol is above goal and need to increase simvastatin (if taking regularly) or resume simvastatin. Pt unable to be reached.  Consult Visit: 10/17/19 Dr Everette Rank [facility] neurology): f/u for essential tremor, stroke. Stroke with signs of ischemic and hemorrhagic components, likely related to uncontrolled HTN. Will require indefinite antiplatelet regimen.  Reduce primidone to 3 tablets AM, 2 tab PM, ordered MRI brain.  09/03/19 Dr Georgina Snell (sports med): add triamcinolone to Eucerin for dry skin.  Allergies  Allergen Reactions  . Sildenafil Other (See Comments)    Caused a headache   * Medications: Outpatient Encounter Medications as of 03/17/2020  Medication Sig Note  . amLODipine (NORVASC) 10 MG tablet TAKE 1 TABLET(10 MG) BY MOUTH DAILY   . hydrALAZINE (APRESOLINE) 25 MG tablet  Take 1 tablet (25 mg total) by mouth 3 (three) times daily. 03/17/2020: Takes once daily  . losartan-hydrochlorothiazide (HYZAAR) 100-25 MG tablet Take 1 tablet by mouth daily. Annual appt due in Sept must see provider for future refills   . metFORMIN (GLUCOPHAGE) 1000 MG tablet TAKE 1 TABLET(1000 MG) BY MOUTH TWICE DAILY WITH A MEAL (Patient taking differently: Take 1,000 mg by mouth daily with breakfast. )   . Multiple Vitamins-Minerals (CENTRUM SILVER PO) Take 1 tablet by mouth 2 (two) times a week.    . naproxen sodium (ALEVE) 220 MG tablet Take 220-440 mg by mouth 2 (two) times daily as needed (for pain or headaches).   . primidone (MYSOLINE) 250 MG tablet Take 750 mg by mouth See admin instructions. Take 750 mg by mouth in the morning   . propranolol ER (INDERAL LA) 120 MG 24 hr capsule TAKE 1 CAPSULE(120 MG) BY MOUTH DAILY   . simvastatin (ZOCOR) 20 MG tablet TAKE 1 TABLET(20 MG) BY MOUTH AT BEDTIME   . Triamcinolone Acetonide (TRIAMCINOLONE 0.1 % CREAM : EUCERIN) CREA Apply 1 application topically 2 (two) times daily as needed. Disp 1 pound   . potassium chloride SA (KLOR-CON) 20 MEQ tablet Take 1 tablet (20 mEq total) by mouth once for 1 dose. Annual appt due in Sept must see provider for future refills    No facility-administered encounter medications on file as of 03/17/2020.    Wt Readings from Last 3 Encounters:  11/13/19 184 lb (83.5 kg)  10/29/19 183 lb (83 kg)  09/03/19 183 lb 12.8  oz (83.4 kg)    Current Diagnosis/Assessment:  SDOH Interventions     Most Recent Value  SDOH Interventions  Financial Strain Interventions Intervention Not Indicated      Goals Addressed            This Visit's Progress   . Pharmacy Care Plan       CARE PLAN ENTRY (see longitudinal plan of care for additional care plan information)  Current Barriers:  . Chronic Disease Management support, education, and care coordination needs related to Hypertension, Hyperlipidemia, Diabetes, and  Essential tremor   Hypertension BP Readings from Last 3 Encounters:  11/13/19 (!) 144/86  11/12/19 (!) 191/115  09/03/19 (!) 148/96 .  Pharmacist Clinical Goal(s): o Over the next 60 days, patient will work with PharmD and providers to achieve BP goal <140/90 . Current regimen:  o Amlodipine 10 mg daily o Hydralazine 25 mg once daily o Losartan-HCTZ 100-25 mg daily o Propranolol ER 120 mg daily . Interventions: o Discussed BP goals and benefits of medications for prevention of heart attack / stroke o Recommended to continue current medications as BP is at or close to goal at home . Patient self care activities - Over the next 60 days, patient will: o Check BP daily, document, and provide at future appointments o Ensure daily salt intake < 2300 mg/day  Hyperlipidemia Lab Results  Component Value Date/Time   LDLCALC 132 (H) 11/13/2019 02:18 PM   LDLDIRECT 173.8 01/10/2013 10:50 AM .  Pharmacist Clinical Goal(s): o Over the next 60 days, patient will work with PharmD and providers to achieve LDL goal < 70 . Current regimen:  o Simvastatin 20 mg daily . Interventions: o Discussed cholesterol goals and benefits of medications for prevention of heart attack / stroke; advised to discuss statin dose change with neurologist o Discussed benefits of aspirin for stroke prevention; advised to start taking aspirin 81 mg daily . Patient self care activities - Over the next 60 days, patient will: o Start aspirin 81 mg daily o Follow up with neurologist as scheduled to discuss aspirin and statin o Reduce cholesterol in diet (handout provided)  Diabetes Lab Results  Component Value Date/Time   HGBA1C 5.9 11/13/2019 02:18 PM   HGBA1C 5.8 (A) 07/06/2019 08:27 AM   HGBA1C 6.3 (H) 03/23/2019 03:21 AM .  Pharmacist Clinical Goal(s): o Over the next 60 days, patient will work with PharmD and providers to maintain A1c goal <7% . Current regimen:  o Metformin 1000 mg once a  day . Interventions: o Discussed A1c goals and benefits of medications for prevention of heart attack / stroke . Patient self care activities - Over the next 60 days, patient will: o Continue current medications  Essential tremor . Pharmacist Clinical Goal(s) o Over the next 60 days, patient will work with PharmD and providers to optimize therapy . Current regimen:  o Primidone 60 mg - 3 tabs in AM and 2 tabs at bedtime o Propranolol ER 120 mg daily . Interventions: o Discussed sedating effects of primidone and importance of finding lowest effective dose o Advised to take 2 tablets AM and 2 tablets PM until follow up with neurologist in October . Patient self care activities - Over the next 60 days, patient will: o Follow up with neurologist as scheduled to discuss Primidone dose  Medication management . Pharmacist Clinical Goal(s): o Over the next 60 days, patient will work with PharmD and providers to maintain optimal medication adherence . Current pharmacy: [facility] .  Interventions o Comprehensive medication review performed. o Continue current medication management strategy . Patient self care activities - Over the next 60 days, patient will: o Focus on medication adherence by pill box o Take medications as prescribed o Report any questions or concerns to PharmD and/or provider(s)  Initial goal documentation       Hypertension   BP goal is:  <140/90   Office blood pressures are  BP Readings from Last 3 Encounters:  11/13/19 (!) 144/86  11/12/19 (!) 191/115  09/03/19 (!) 148/96   Kidney Function Lab Results  Component Value Date/Time   CREATININE 1.05 11/13/2019 02:18 PM   CREATININE 1.35 04/26/2019 03:48 PM   GFR 83.39 11/13/2019 02:18 PM   GFRNONAA >60 03/24/2019 03:17 AM   GFRAA >60 03/24/2019 03:17 AM   K 3.5 11/13/2019 02:18 PM   K 3.7 04/26/2019 03:48 PM   Patient checks BP at home daily Patient home BP readings are ranging: 140s/90s  Patient has  failed these meds in the past: n/a Patient is currently controlled on the following medications:  . Amlodipine 10 mg daily . Hydralazine 25 mg once daily . Losartan-HCTZ 100-25 mg daily . Propranolol ER 120 mg daily  We discussed diet and exercise extensively; BP goals; benefits of medications; pt was previously told to take hydralazine PRN for headaches and "high BP" however he does take it once daily and BP has been at goal, so he was advised to continue taking it this way.   Plan  Continue current medications and control with diet and exercise     Hyperlipidemia   LDL goal < 70 Hx stroke Sept 2020 - ischemic and hemorrhagic components LDL steadily increasing 90 > 132 since 2019  Lipid Panel     Component Value Date/Time   CHOL 212 (H) 11/13/2019 1418   TRIG 120.0 11/13/2019 1418   HDL 56.10 11/13/2019 1418   LDLCALC 132 (H) 11/13/2019 1418   LDLDIRECT 173.8 01/10/2013 1050    Hepatic Function Latest Ref Rng & Units 11/13/2019 04/26/2019 03/22/2019  Total Protein 6.0 - 8.3 g/dL 7.5 7.4 7.2  Albumin 3.5 - 5.2 g/dL 4.2 4.3 3.6  AST 0 - 37 U/L _0 ALT 0 - 53 U/L _1 Alk Phosphatase 39 - 117 U/L 182(H) 198(H) 204(H)  Total Bilirubin 0.2 - 1.2 mg/dL 0.3 0.4 0.4  Bilirubin, Direct 0.0 - 0.3 mg/dL - - -    The ASCVD Risk score Mikey Bussing DC Jr., et al., 2013) failed to calculate for the following reasons:   The patient has a prior MI or stroke diagnosis   Patient has failed these meds in past: n/a Patient is currently uncontrolled on the following medications:  . Simvastatin 20 mg HS  We discussed:  diet and exercise extensively; Cholesterol goals; benefits of statin for ASCVD risk reduction; pt was advised that PCP previously wanted to increase statin dose; pt has f/u with neurologist in October and wants to discuss statin changes then.  Plan  Continue current medications and control with diet and exercise  Diabetes   A1c goal <7%  Recent Relevant Labs: Lab  Results  Component Value Date/Time   HGBA1C 5.9 11/13/2019 02:18 PM   HGBA1C 5.8 (A) 07/06/2019 08:27 AM   HGBA1C 6.3 (H) 03/23/2019 03:21 AM   GFR 83.39 11/13/2019 02:18 PM   GFR 62.49 04/26/2019 03:48 PM   MICROALBUR 8.2 (H) 07/12/2018 09:38 AM   MICROALBUR 7.2 (H) 05/06/2016 09:51 AM  Last diabetic Eye exam:  Lab Results  Component Value Date/Time   HMDIABEYEEXA No Retinopathy 07/26/2019 12:00 AM    Last diabetic Foot exam: No results found for: HMDIABFOOTEX   Patient has failed these meds in past: n/a Patient is currently controlled on the following medications: Marland Kitchen Metformin 1000 mg once daily  We discussed: diet and exercise extensively; A1c goals; pt has been taking metformin once daily and A1c is at goal; advised to continue taking it this way  Plan  Continue current medications and control with diet and exercise  Essential tremor   Patient has failed these meds in past: n/a Patient is currently controlled on the following medications:  . Primidone 250 mg - 3 tablets AM and 2 tabs PM . Propranolol ER 120 mg daily  We discussed:  Benefits of medications for treating tremor; pt does not have visible tremor in office today and denies tremor at home; he does report primidone dose making him tired/sedated; advised to reduce dose to 2 tabs BID as previously instructed by neurologist, and keep scheduled f/u next month  Plan  Continue current medications  F/U with neurologist as scheduled  Health Maintenance   Patient is currently controlled on the following medications:  Marland Kitchen Multivitamin . Potassium chloride 20 mEq . Triamcinolone 0.1% cream  We discussed:  Patient is satisfied with current regimen and denies issues  Plan  Continue current medications  Medication Management   Pt uses [facility] for all medications Uses pill box? Yes Pt endorses 100% compliance  We discussed: Discussed benefits of medication synchronization, packaging and delivery as  well as enhanced pharmacist oversight with [facility]. Pt wants to think about changing pharmacies and will contact office with decision.  Plan  Continue current medication management strategy    Follow up: 2 month phone visit   Charlene Brooke, PharmD, BCACP Clinical Pharmacist [facility] [phone]

## 2020-03-17 ENCOUNTER — Other Ambulatory Visit: Payer: Self-pay

## 2020-03-17 ENCOUNTER — Ambulatory Visit: Payer: Medicare Other | Admitting: Pharmacist

## 2020-03-17 DIAGNOSIS — E118 Type 2 diabetes mellitus with unspecified complications: Secondary | ICD-10-CM

## 2020-03-17 DIAGNOSIS — E1169 Type 2 diabetes mellitus with other specified complication: Secondary | ICD-10-CM

## 2020-03-17 DIAGNOSIS — I10 Essential (primary) hypertension: Secondary | ICD-10-CM

## 2020-03-17 DIAGNOSIS — G250 Essential tremor: Secondary | ICD-10-CM

## 2020-03-17 NOTE — Patient Instructions (Addendum)
Visit Information  Phone number for Pharmacist: [phone]  Thank you for meeting with me to discuss your medications! I look forward to working with you to achieve your health care goals. Below is a summary of what we talked about during the visit:  Goals Addressed            This Visit's Progress   . Pharmacy Care Plan       CARE PLAN ENTRY (see longitudinal plan of care for additional care plan information)  Current Barriers:  . Chronic Disease Management support, education, and care coordination needs related to Hypertension, Hyperlipidemia, Diabetes, and Essential tremor   Hypertension BP Readings from Last 3 Encounters:  11/13/19 (!) 144/86  11/12/19 (!) 191/115  09/03/19 (!) 148/96 .  Pharmacist Clinical Goal(s): o Over the next 60 days, patient will work with PharmD and providers to achieve BP goal <140/90 . Current regimen:  o Amlodipine 10 mg daily o Hydralazine 25 mg once daily o Losartan-HCTZ 100-25 mg daily o Propranolol ER 120 mg daily . Interventions: o Discussed BP goals and benefits of medications for prevention of heart attack / stroke o Recommended to continue current medications as BP is at or close to goal at home . Patient self care activities - Over the next 60 days, patient will: o Check BP daily, document, and provide at future appointments o Ensure daily salt intake < 2300 mg/day  Hyperlipidemia Lab Results  Component Value Date/Time   LDLCALC 132 (H) 11/13/2019 02:18 PM   LDLDIRECT 173.8 01/10/2013 10:50 AM .  Pharmacist Clinical Goal(s): o Over the next 60 days, patient will work with PharmD and providers to achieve LDL goal < 70 . Current regimen:  o Simvastatin 20 mg daily . Interventions: o Discussed cholesterol goals and benefits of medications for prevention of heart attack / stroke; advised to discuss statin dose change with neurologist o Discussed benefits of aspirin for stroke prevention; advised to start taking aspirin 81 mg  daily . Patient self care activities - Over the next 60 days, patient will: o Start aspirin 81 mg daily o Follow up with neurologist as scheduled to discuss aspirin and statin o Reduce cholesterol in diet (handout provided)  Diabetes Lab Results  Component Value Date/Time   HGBA1C 5.9 11/13/2019 02:18 PM   HGBA1C 5.8 (A) 07/06/2019 08:27 AM   HGBA1C 6.3 (H) 03/23/2019 03:21 AM .  Pharmacist Clinical Goal(s): o Over the next 60 days, patient will work with PharmD and providers to maintain A1c goal <7% . Current regimen:  o Metformin 1000 mg once a day . Interventions: o Discussed A1c goals and benefits of medications for prevention of heart attack / stroke . Patient self care activities - Over the next 60 days, patient will: o Continue current medications  Essential tremor . Pharmacist Clinical Goal(s) o Over the next 60 days, patient will work with PharmD and providers to optimize therapy . Current regimen:  o Primidone 60 mg - 3 tabs in AM and 2 tabs at bedtime o Propranolol ER 120 mg daily . Interventions: o Discussed sedating effects of primidone and importance of finding lowest effective dose o Advised to take 2 tablets AM and 2 tablets PM until follow up with neurologist in October . Patient self care activities - Over the next 60 days, patient will: o Follow up with neurologist as scheduled to discuss Primidone dose  Medication management . Pharmacist Clinical Goal(s): o Over the next 60 days, patient will work with PharmD and  providers to maintain optimal medication adherence . Current pharmacy: [facility] . Interventions o Comprehensive medication review performed. o Continue current medication management strategy . Patient self care activities - Over the next 60 days, patient will: o Focus on medication adherence by pill box o Take medications as prescribed o Report any questions or concerns to PharmD and/or provider(s)  Initial goal documentation      Mr.  Drew Harrington was given information about Chronic Care Management services today including:  1. CCM service includes personalized support from designated clinical staff supervised by his physician, including individualized plan of care and coordination with other care providers 2. 24/7 contact phone numbers for assistance for urgent and routine care needs. 3. Standard insurance, coinsurance, copays and deductibles apply for chronic care management only during months in which we provide at least 20 minutes of these services. Most insurances cover these services at 100%, however patients may be responsible for any copay, coinsurance and/or deductible if applicable. This service may help you avoid the need for more expensive face-to-face services. 4. Only one practitioner may furnish and bill the service in a calendar month. 5. The patient may stop CCM services at any time (effective at the end of the month) by phone call to the office staff.  Patient agreed to services and verbal consent obtained.   Print copy of patient instructions provided.  Telephone follow up appointment with pharmacy team member scheduled for: 2 months  Charlene Brooke, PharmD, BCACP Clinical Pharmacist [facility] [phone]   Cholesterol Content in Foods Cholesterol is a waxy, fat-like substance that helps to carry fat in the blood. The body needs cholesterol in small amounts, but too much cholesterol can cause damage to the arteries and heart. Most people should eat less than 200 milligrams (mg) of cholesterol a day. Foods with cholesterol  Cholesterol is found in animal-based foods, such as meat, seafood, and dairy. Generally, low-fat dairy and lean meats have less cholesterol than full-fat dairy and fatty meats. The milligrams of cholesterol per serving (mg per serving) of common cholesterol-containing foods are listed below. Meat and other proteins  Egg -- one large whole egg has 186  mg.  Veal shank -- 4 oz has 141 mg.  Lean ground [place] (93% lean) -- 4 oz has 118 mg.  Fat-trimmed lamb loin -- 4 oz has 106 mg.  Lean ground beef (90% lean) -- 4 oz has 100 mg.  Lobster -- 3.5 oz has 90 mg.  Pork loin chops -- 4 oz has 86 mg.  Canned salmon -- 3.5 oz has 83 mg.  Fat-trimmed beef top loin -- 4 oz has 78 mg.  Frankfurter -- 1 frank (3.5 oz) has 77 mg.  Crab -- 3.5 oz has 71 mg.  Roasted chicken without skin, white meat -- 4 oz has 66 mg.  Light bologna -- 2 oz has 45 mg.  Deli-cut [place] -- 2 oz has 31 mg.  Canned tuna -- 3.5 oz has 31 mg.  Berniece Salines -- 1 oz has 29 mg.  Oysters and mussels (raw) -- 3.5 oz has 25 mg.  Mackerel -- 1 oz has 22 mg.  Trout -- 1 oz has 20 mg.  Pork sausage -- 1 link (1 oz) has 17 mg.  Salmon -- 1 oz has 16 mg.  Tilapia -- 1 oz has 14 mg. Dairy  Soft-serve ice cream --  cup (4 oz) has 103 mg.  Whole-milk yogurt -- 1 cup (8 oz) has 29 mg.  Cheddar cheese -- 1 oz has 28 mg.  American cheese -- 1 oz has 28 mg.  Whole milk -- 1 cup (8 oz) has 23 mg.  2% milk -- 1 cup (8 oz) has 18 mg.  Cream cheese -- 1 tablespoon (Tbsp) has 15 mg.  Cottage cheese --  cup (4 oz) has 14 mg.  Low-fat (1%) milk -- 1 cup (8 oz) has 10 mg.  Sour cream -- 1 Tbsp has 8.5 mg.  Low-fat yogurt -- 1 cup (8 oz) has 8 mg.  Nonfat Greek yogurt -- 1 cup (8 oz) has 7 mg.  Half-and-half cream -- 1 Tbsp has 5 mg. Fats and oils  Cod liver oil -- 1 tablespoon (Tbsp) has 82 mg.  Butter -- 1 Tbsp has 15 mg.  Lard -- 1 Tbsp has 14 mg.  Bacon grease -- 1 Tbsp has 14 mg.  Mayonnaise -- 1 Tbsp has 5-10 mg.  Margarine -- 1 Tbsp has 3-10 mg. Exact amounts of cholesterol in these foods may vary depending on specific ingredients and brands. Foods without cholesterol Most plant-based foods do not have cholesterol unless you combine them with a food that has cholesterol. Foods without cholesterol include:  Grains and  cereals.  Vegetables.  Fruits.  Vegetable oils, such as olive, canola, and sunflower oil.  Legumes, such as peas, beans, and lentils.  Nuts and seeds.  Egg whites. Summary  The body needs cholesterol in small amounts, but too much cholesterol can cause damage to the arteries and heart.  Most people should eat less than 200 milligrams (mg) of cholesterol a day. This information is not intended to replace advice given to you by your health care provider. Make sure you discuss any questions you have with your health care provider. Document Revised: 05/27/2017 Document Reviewed: 02/08/2017 Elsevier Patient Education  [employer].  Exercises To Do While Sitting  Exercises that you do while sitting (chair exercises) can give you many of the same benefits as full exercise. Benefits include strengthening your heart, burning calories, and keeping muscles and joints healthy. Exercise can also improve your mood and help with depression and anxiety. You may benefit from chair exercises if you are unable to do standing exercises because of:  Diabetic foot pain.  Obesity.  Illness.  Arthritis.  Recovery from surgery or injury.  Breathing problems.  Balance problems.  Another type of disability. Before starting chair exercises, check with your health care provider or a physical therapist to find out how much exercise you can tolerate and which exercises are safe for you. If your health care provider approves:  Start out slowly and build up over time. Aim to work up to about 10-20 minutes for each exercise session.  Make exercise part of your daily routine.  Drink water when you exercise. Do not wait until you are thirsty. Drink every 10-15 minutes.  Stop exercising right away if you have pain, nausea, shortness of breath, or dizziness.  If you are exercising in a wheelchair, make sure to lock the wheels.  Ask your health care provider whether you can do tai chi or yoga.  Many positions in these mind-body exercises can be modified to do while seated. Warm-up Before starting other exercises: 1. Sit up as straight as you can. Have your knees bent at 90 degrees, which is the shape of the capital letter "L." Keep your feet flat on the floor. 2. Sit at the front edge of your chair, if you can. 3.  Pull in (tighten) the muscles in your abdomen and stretch your spine and neck as straight as you can. Hold this position for a few minutes. 4. Breathe in and out evenly. Try to concentrate on your breathing, and relax your mind. Stretching Exercise A: Arm stretch 1. Hold your arms out straight in front of your body. 2. Bend your hands at the wrist with your fingers pointing up, as if signaling someone to stop. Notice the slight tension in your forearms as you hold the position. 3. Keeping your arms out and your hands bent, rotate your hands outward as far as you can and hold this stretch. Aim to have your thumbs pointing up and your pinkie fingers pointing down. Slowly repeat arm stretches for one minute as tolerated. Exercise B: Leg stretch 1. If you can move your legs, try to "draw" letters on the floor with the toes of your foot. Write your name with one foot. 2. Write your name with the toes of your other foot. Slowly repeat the movements for one minute as tolerated. Exercise C: Reach for the sky 1. Reach your hands as far over your head as you can to stretch your spine. 2. Move your hands and arms as if you are climbing a rope. Slowly repeat the movements for one minute as tolerated. Range of motion exercises Exercise A: Shoulder roll 1. Let your arms hang loosely at your sides. 2. Lift just your shoulders up toward your ears, then let them relax back down. 3. When your shoulders feel loose, rotate your shoulders in backward and forward circles. Do shoulder rolls slowly for one minute as tolerated. Exercise B: March in place 1. As if you are marching, pump your  arms and lift your legs up and down. Lift your knees as high as you can. ? If you are unable to lift your knees, just pump your arms and move your ankles and feet up and down. March in place for one minute as tolerated. Exercise C: Seated jumping jacks 1. Let your arms hang down straight. 2. Keeping your arms straight, lift them up over your head. Aim to point your fingers to the ceiling. 3. While you lift your arms, straighten your legs and slide your heels along the floor to your sides, as wide as you can. 4. As you bring your arms back down to your sides, slide your legs back together. ? If you are unable to use your legs, just move your arms. Slowly repeat seated jumping jacks for one minute as tolerated. Strengthening exercises Exercise A: Shoulder squeeze 1. Hold your arms straight out from your body to your sides, with your elbows bent and your fists pointed at the ceiling. 2. Keeping your arms in the bent position, move them forward so your elbows and forearms meet in front of your face. 3. Open your arms back out as wide as you can with your elbows still bent, until you feel your shoulder blades squeezing together. Hold for 5 seconds. Slowly repeat the movements forward and backward for one minute as tolerated. Contact a health care provider if you:  Had to stop exercising due to any of the following: ? Pain. ? Nausea. ? Shortness of breath. ? Dizziness. ? Fatigue.  Have significant pain or soreness after exercising. Get help right away if you have:  Chest pain.  Difficulty breathing. These symptoms may represent a serious problem that is an emergency. Do not wait to see if the symptoms will go away. Get  medical help right away. Call your local emergency services (911 in the [place] Do not drive yourself to the hospital. This information is not intended to replace advice given to you by your health care provider. Make sure you discuss any questions you have with your health  care provider. Document Revised: 10/05/2018 Document Reviewed: 04/27/2017 Elsevier Patient Education  2020 [employer].

## 2020-03-19 ENCOUNTER — Other Ambulatory Visit: Payer: Self-pay

## 2020-03-19 ENCOUNTER — Ambulatory Visit: Payer: Medicare Other | Admitting: Family Medicine

## 2020-03-19 DIAGNOSIS — I10 Essential (primary) hypertension: Secondary | ICD-10-CM

## 2020-03-25 ENCOUNTER — Telehealth: Payer: Self-pay | Admitting: Emergency Medicine

## 2020-03-25 ENCOUNTER — Other Ambulatory Visit: Payer: Self-pay | Admitting: Internal Medicine

## 2020-03-25 ENCOUNTER — Ambulatory Visit: Payer: Self-pay

## 2020-03-25 ENCOUNTER — Other Ambulatory Visit: Payer: Self-pay

## 2020-03-25 ENCOUNTER — Encounter: Payer: Self-pay | Admitting: Family Medicine

## 2020-03-25 ENCOUNTER — Ambulatory Visit: Payer: Medicare Other | Admitting: Family Medicine

## 2020-03-25 VITALS — BP 160/88 | HR 78 | Ht 69.0 in | Wt 191.0 lb

## 2020-03-25 DIAGNOSIS — M25511 Pain in right shoulder: Secondary | ICD-10-CM | POA: Diagnosis not present

## 2020-03-25 DIAGNOSIS — G8929 Other chronic pain: Secondary | ICD-10-CM | POA: Diagnosis not present

## 2020-03-25 DIAGNOSIS — I10 Essential (primary) hypertension: Secondary | ICD-10-CM

## 2020-03-25 NOTE — Telephone Encounter (Signed)
Error

## 2020-03-25 NOTE — Progress Notes (Signed)
   I, Wendy Poet, LAT, ATC, am serving as scribe for Dr. Lynne Leader.  Drew Harrington is a 74 y.o. male who presents to [place] [facility] today for f/u of R shoulder pain.  He was last seen by Dr. Georgina Snell on 09/03/19 for f/u of B shoulder pain and noted improvement in his symptoms.  He has had a prior R and L shoulder injection and has completed a limited course of PT.  Since his last visit, pt reports that his R shoulder pain has been flared up over the last few months.  He states that he has increased pain at night when he lays down to try and go to sleep.   Diagnostic testing: R and L shoulder XR- 07/06/19   Pertinent review of systems: No fevers or chills  Relevant historical information: History of stroke   Exam:  BP (!) 160/88 (BP Location: Left Arm, Patient Position: Sitting, Cuff Size: Normal)   Pulse 78   Ht 5\' 9"  (1.753 m)   Wt 191 lb (86.6 kg)   SpO2 98%   BMI 28.21 kg/m  General: Well Developed, well nourished, and in no acute distress.   MSK: Right shoulder normal-appearing Range of motion decreased abduction external rotation right shoulder. Strength intact within limits of motion. Unable to get to correct positioning for impingement testing. Pulses cap refill and sensation are intact distally.    Lab and Radiology Results  Procedure: Real-time Ultrasound Guided Injection of shoulder right glenohumeral joint posterior approach Device: Philips Affiniti 50G Images permanently stored and available for review in PACS Verbal informed consent obtained.  Discussed risks and benefits of procedure. Warned about infection bleeding damage to structures skin hypopigmentation and fat atrophy among others. Patient expresses understanding and agreement Time-out conducted.   Noted no overlying erythema, induration, or other signs of local infection.   Skin prepped in a sterile fashion.   Local anesthesia: Topical Ethyl chloride.   With sterile  technique and under real time ultrasound guidance:  40 mg Kenalog and 2 mL of Marcaine injected into posterior joint. Fluid seen entering the joint.   Completed without difficulty   Pain immediately resolved suggesting accurate placement of the medication.   Advised to call if fevers/chills, erythema, induration, drainage, or persistent bleeding.   Images permanently stored and available for review in the ultrasound unit.  Impression: Technically successful ultrasound guided injection.         Assessment and Plan: 74 y.o. male with right shoulder pain thought originally in January of this year to be adhesive capsulitis.  X-ray did show some degenerative changes.  Pain responded very well to limited PT and injection.  Plan for repeat injection and home exercise program if not improving return to clinic for further evaluation and management.   PDMP not reviewed this encounter. Orders Placed This Encounter  Procedures  . [place] LIMITED JOINT SPACE STRUCTURES UP RIGHT(NO LINKED CHARGES)    Order Specific Question:   Reason for Exam (SYMPTOM  OR DIAGNOSIS REQUIRED)    Answer:   R shoulder pain    Order Specific Question:   Preferred imaging location?    Answer:   [facility]   No orders of the defined types were placed in this encounter.    Discussed warning signs or symptoms. Please see discharge instructions. Patient expresses understanding.   The above documentation has been reviewed and is accurate and complete Lynne Leader, M.D.

## 2020-03-25 NOTE — Patient Instructions (Signed)
Thank you for coming in today.  Call or go to the ER if you develop a large red swollen joint with extreme pain or oozing puss.   Recheck if not better.  We can do more.

## 2020-03-25 NOTE — Telephone Encounter (Signed)
Pt is requesting that his refill on metformin be sent in to [facility]. Pt also needs clarification on how often to take the medication. The patient was told to take it once a day but the prescription states to take twice a day. Can you call patient back with how often to take the medication. Thanks.

## 2020-04-07 ENCOUNTER — Other Ambulatory Visit: Payer: Self-pay | Admitting: Internal Medicine

## 2020-04-07 DIAGNOSIS — I10 Essential (primary) hypertension: Secondary | ICD-10-CM

## 2020-04-11 ENCOUNTER — Other Ambulatory Visit: Payer: Self-pay | Admitting: Internal Medicine

## 2020-04-11 DIAGNOSIS — I10 Essential (primary) hypertension: Secondary | ICD-10-CM

## 2020-04-17 DIAGNOSIS — Z8673 Personal history of transient ischemic attack (TIA), and cerebral infarction without residual deficits: Secondary | ICD-10-CM | POA: Diagnosis not present

## 2020-04-17 DIAGNOSIS — R413 Other amnesia: Secondary | ICD-10-CM | POA: Diagnosis not present

## 2020-04-17 DIAGNOSIS — R030 Elevated blood-pressure reading, without diagnosis of hypertension: Secondary | ICD-10-CM | POA: Diagnosis not present

## 2020-04-17 DIAGNOSIS — G250 Essential tremor: Secondary | ICD-10-CM | POA: Diagnosis not present

## 2020-05-08 ENCOUNTER — Ambulatory Visit: Payer: Medicare Other | Admitting: Pharmacist

## 2020-05-08 ENCOUNTER — Other Ambulatory Visit: Payer: Self-pay

## 2020-05-08 DIAGNOSIS — E785 Hyperlipidemia, unspecified: Secondary | ICD-10-CM

## 2020-05-08 DIAGNOSIS — I10 Essential (primary) hypertension: Secondary | ICD-10-CM

## 2020-05-08 DIAGNOSIS — E118 Type 2 diabetes mellitus with unspecified complications: Secondary | ICD-10-CM

## 2020-05-08 DIAGNOSIS — E1169 Type 2 diabetes mellitus with other specified complication: Secondary | ICD-10-CM

## 2020-05-08 DIAGNOSIS — G250 Essential tremor: Secondary | ICD-10-CM

## 2020-05-08 NOTE — Patient Instructions (Signed)
Visit Information  Phone number for Pharmacist: [phone]  Goals Addressed            This [place] (see longitudinal plan of care for additional care plan information)  Current Barriers:   Chronic Disease Management support, education, and care coordination needs related to Hypertension, Hyperlipidemia, Diabetes, and Essential tremor   Hypertension BP Readings from Last 3 Encounters:  03/25/20 (!) 160/88  11/13/19 (!) 144/86  11/12/19 (!) 191/115   Pharmacist Clinical Goal(s): o Over the next 60 days, patient will work with PharmD and providers to achieve BP goal <140/90  Current regimen:  o Amlodipine 10 mg daily o Hydralazine 25 mg once daily o Losartan-HCTZ 100-25 mg daily o Propranolol ER 120 mg daily  Interventions: o Discussed BP goals and benefits of medications for prevention of heart attack / stroke o Recommended to continue current medications as BP is at or close to goal at home  Patient self care activities - Over the next 60 days, patient will: o Check BP daily, document, and provide at future appointments o Ensure daily salt intake < 2300 mg/day  Hyperlipidemia Lab Results  Component Value Date/Time   LDLCALC 132 (H) 11/13/2019 02:18 PM   LDLDIRECT 173.8 01/10/2013 10:50 AM   Pharmacist Clinical Goal(s): o Over the next 60 days, patient will work with PharmD and providers to achieve LDL goal < 70  Current regimen:  o Simvastatin 20 mg daily o Aspirin 81 mg daily  Interventions: o Discussed cholesterol goals and benefits of medications for prevention of heart attack / stroke; advised to discuss statin dose change with neurologist o Discussed benefits of aspirin for stroke prevention  Patient self care activities - Over the next 60 days, patient will: o Continue medications as directed o Reduce cholesterol in diet (handout provided)  Diabetes Lab Results  Component Value Date/Time    HGBA1C 5.9 11/13/2019 02:18 PM   HGBA1C 5.8 (A) 07/06/2019 08:27 AM   HGBA1C 6.3 (H) 03/23/2019 03:21 AM   Pharmacist Clinical Goal(s): o Over the next 60 days, patient will work with PharmD and providers to maintain A1c goal <7%  Current regimen:  o Metformin 1000 mg once a day  Interventions: o Discussed A1c goals and benefits of medications for prevention of heart attack / stroke  Patient self care activities - Over the next 60 days, patient will: o Continue current medications  Essential tremor  Pharmacist Clinical Goal(s) o Over the next 60 days, patient will work with PharmD and providers to optimize therapy  Current regimen:  o Primidone 60 mg - 2 tabs in AM and 2 tabs at bedtime o Propranolol ER 120 mg daily o Escitalopram 10 mg daily  Interventions: o Discussed sedating effects of primidone and importance of finding lowest effective dose o Advised to take 2 tablets AM and 2 tablets PM as previously instructed by neurologist  Patient self care activities - Over the next 60 days, patient will: o Follow up with neurologist as scheduled  Medication management  Pharmacist Clinical Goal(s): o Over the next 60 days, patient will work with PharmD and providers to maintain optimal medication adherence  Current pharmacy: [facility]  Interventions o Comprehensive medication review performed. o Continue current medication management strategy  Patient self care activities - Over the next 60 days, patient will: o Focus on medication adherence by pill box o Take medications as prescribed o Report any questions  or concerns to PharmD and/or provider(s)  Please see past updates related to this goal by clicking on the "Past Updates" button in the selected goal       Patient verbalizes understanding of instructions provided today.  Telephone follow up appointment with pharmacy team member scheduled for: 2 months  Charlene Brooke, PharmD, [facility] Clinical  Pharmacist [facility] [phone]

## 2020-05-08 NOTE — Chronic Care Management (AMB) (Signed)
Chronic Care Management Pharmacy  Name: Drew Harrington  MRN: [id number] DOB: [birth year]   Chief Complaint/ HPI  Drew Harrington,  74 y.o. , male presents for their Follow-Up CCM visit with the clinical pharmacist via telephone due to COVID-19 Pandemic.  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 He worked up until his stroke in Fall 2020. He lives with his wife of 27 years. Since his stroke he stays home most days, watching TV and tending to household. He used to walk outside often but stopped after stroke due to unsteadiness, he feels he is doing better now and wants to start walking again. He does not use anything to assist with movement.  Office Visits: 11/13/19 Dr [name] OV: R arm/leg weakness from stroke (Sept 2020). BP close to goal but pt unsure if he is taking propranolol. Cholesterol is above goal and need to increase simvastatin (if taking regularly) or resume simvastatin. Pt unable to be reached.  Consult Visit: 04/17/20 Dr Everette Rank [facility] neurology): tremor unimproved, rx Lexapro. Suggest Lipitor 20 mg next.  10/17/19 Dr Everette Rank [facility] neurology): f/u for essential tremor, stroke. Stroke with signs of ischemic and hemorrhagic components, likely related to uncontrolled HTN. Will require indefinite antiplatelet regimen.  Reduce primidone to 3 tablets AM, 2 tab PM, ordered MRI brain.  09/03/19 Dr Georgina Snell (sports med): add triamcinolone to Eucerin for dry skin.  Allergies  Allergen Reactions  . Sildenafil Other (See Comments)    Caused a headache   * Medications: Outpatient Encounter Medications as of 05/08/2020  Medication Sig Note  . amLODipine  (NORVASC) 10 MG tablet TAKE 1 TABLET(10 MG) BY MOUTH DAILY   . aspirin 81 MG EC tablet Take 81 mg by mouth daily.   Marland Kitchen escitalopram (LEXAPRO) 10 MG tablet Take 10 mg by mouth daily.   . hydrALAZINE (APRESOLINE) 25 MG tablet Take 1 tablet (25 mg total) by mouth 3 (three) times daily. 03/17/2020: Takes once daily  . losartan-hydrochlorothiazide (HYZAAR) 100-25 MG tablet TAKE 1 TABLET BY MOUTH DAILY   . metFORMIN (GLUCOPHAGE) 1000 MG tablet TAKE 1 TABLET(1000 MG) BY MOUTH TWICE DAILY WITH A MEAL (Patient taking differently: Take 1,000 mg by mouth daily with breakfast. )   . Multiple Vitamins-Minerals (CENTRUM SILVER PO) Take 1 tablet by mouth 2 (two) times a week.    . naproxen sodium (ALEVE) 220 MG tablet Take 220-440 mg by mouth 2 (two) times daily as needed (for pain or headaches).   . potassium chloride SA (KLOR-CON) 20 MEQ tablet TAKE 1 TABLET BY MOUTH ONCE DAILY   . primidone (MYSOLINE) 250 MG tablet Take 250 mg by mouth See admin instructions. 2 tablets in AM and 2 tablets at bedtime   . propranolol ER (INDERAL LA) 120 MG 24 hr capsule TAKE 1 CAPSULE(120 MG) BY MOUTH DAILY   . simvastatin (ZOCOR) 20 MG tablet TAKE 1 TABLET(20 MG) BY MOUTH AT BEDTIME   . Triamcinolone Acetonide (TRIAMCINOLONE 0.1 % CREAM : EUCERIN) CREA Apply 1 application topically 2 (two) times daily as needed. Disp 1 pound    No facility-administered encounter medications on file as of 05/08/2020.    Wt Readings from Last 3 Encounters:  03/25/20 191 lb (86.6 kg)  11/13/19 184 lb (83.5 kg)  10/29/19 183 lb (83 kg)    Current Diagnosis/Assessment:    Goals Addressed            This Visit's Progress   . Pharmacy Care Plan       CARE PLAN ENTRY (see longitudinal plan of care for additional care plan information)  Current Barriers:  . Chronic Disease Management support, education, and care coordination needs related to Hypertension, Hyperlipidemia, Diabetes, and Essential tremor   Hypertension BP Readings from  Last 3 Encounters:  03/25/20 (!) 160/88  11/13/19 (!) 144/86  11/12/19 (!) 191/115 .  Pharmacist Clinical Goal(s): o Over the next 60 days, patient will work with PharmD and providers to achieve BP goal <140/90 . Current regimen:  o Amlodipine 10 mg daily o Hydralazine 25 mg once daily o Losartan-HCTZ 100-25 mg daily o Propranolol ER 120 mg daily . Interventions: o Discussed BP goals and benefits of medications for prevention of heart attack / stroke o Recommended to continue current medications as BP is at or close to goal at home . Patient self care activities - Over the next 60 days, patient will: o Check BP daily, document, and provide at future appointments o Ensure daily salt intake < 2300 mg/day  Hyperlipidemia Lab Results  Component Value Date/Time   LDLCALC 132 (H) 11/13/2019 02:18 PM   LDLDIRECT 173.8 01/10/2013 10:50 AM .  Pharmacist Clinical Goal(s): o Over the next 60 days, patient will work with PharmD and providers to achieve LDL goal < 70 . Current regimen:  o Simvastatin 20 mg daily o Aspirin 81 mg daily . Interventions: o Discussed cholesterol goals and benefits of medications for prevention of heart attack / stroke; advised to discuss statin dose change with neurologist o Discussed benefits of aspirin for stroke prevention . Patient self care activities - Over the next 60 days, patient will: o Continue medications as directed o Reduce cholesterol in diet (handout provided)  Diabetes Lab Results  Component Value Date/Time   HGBA1C 5.9 11/13/2019 02:18 PM   HGBA1C 5.8 (A) 07/06/2019 08:27 AM   HGBA1C 6.3 (H) 03/23/2019 03:21 AM .  Pharmacist Clinical Goal(s): o Over the next 60 days, patient will work with PharmD and providers to maintain A1c goal <7% . Current regimen:  o Metformin 1000 mg once a day . Interventions: o Discussed A1c goals and benefits of medications for prevention of heart attack / stroke . Patient self care activities - Over the next  60 days, patient will: o Continue current medications  Essential tremor . Pharmacist Clinical Goal(s) o Over the next 60 days, patient will work with PharmD and providers to optimize therapy . Current regimen:  o Primidone 60 mg - 2 tabs in AM and 2 tabs at bedtime o Propranolol ER 120 mg daily o Escitalopram 10 mg daily . Interventions: o Discussed sedating effects of primidone and importance of finding lowest effective dose o Advised to take 2 tablets AM and 2 tablets PM as previously instructed by neurologist . Patient self care activities - Over the next 60 days, patient will: o Follow up with neurologist as scheduled  Medication management . Pharmacist Clinical Goal(s): o Over the next 60 days, patient will work with PharmD and providers to maintain optimal medication adherence . Current pharmacy: [facility] . Interventions o Comprehensive medication review performed. o Continue current medication management strategy . Patient self care activities - Over the next 60 days, patient will:  o Focus on medication adherence by pill box o Take medications as prescribed o Report any questions or concerns to PharmD and/or provider(s)  Please see past updates related to this goal by clicking on the "Past Updates" button in the selected goal        Hypertension   BP goal is:  <140/90   Office blood pressures are  BP Readings from Last 3 Encounters:  03/25/20 (!) 160/88  11/13/19 (!) 144/86  11/12/19 (!) 191/115   Kidney Function Lab Results  Component Value Date/Time   CREATININE 1.05 11/13/2019 02:18 PM   CREATININE 1.35 04/26/2019 03:48 PM   GFR 83.39 11/13/2019 02:18 PM   GFRNONAA >60 03/24/2019 03:17 AM   GFRAA >60 03/24/2019 03:17 AM   K 3.5 11/13/2019 02:18 PM   K 3.7 04/26/2019 03:48 PM   Patient checks BP at home daily Patient home BP readings are ranging: 140s/90s  Patient has failed these meds in the past: n/a Patient is currently controlled on the  following medications:  . Amlodipine 10 mg daily . Hydralazine 25 mg once daily . Losartan-HCTZ 100-25 mg daily . Propranolol ER 120 mg daily  We discussed diet and exercise extensively; BP goals; benefits of medications; BP at home is at or close to goal, likely some level of white coat syndrome   Plan  Continue current medications and control with diet and exercise     Hyperlipidemia   LDL goal < 70 Hx stroke Sept 2020 - ischemic and hemorrhagic components LDL steadily increasing 90 > 132 since 2019  Lipid Panel     Component Value Date/Time   CHOL 212 (H) 11/13/2019 1418   TRIG 120.0 11/13/2019 1418   HDL 56.10 11/13/2019 1418   LDLCALC 132 (H) 11/13/2019 1418   LDLDIRECT 173.8 01/10/2013 1050    Hepatic Function Latest Ref Rng & Units 11/13/2019 04/26/2019 03/22/2019  Total Protein 6.0 - 8.3 g/dL 7.5 7.4 7.2  Albumin 3.5 - 5.2 g/dL 4.2 4.3 3.6  AST 0 - 37 U/L $Remo'18 21 20  'itIxm$ ALT 0 - 53 U/L $Remo'24 31 31  'HxAYR$ Alk Phosphatase 39 - 117 U/L 182(H) 198(H) 204(H)  Total Bilirubin 0.2 - 1.2 mg/dL 0.3 0.4 0.4  Bilirubin, Direct 0.0 - 0.3 mg/dL - - -    The ASCVD Risk score Mikey Bussing DC Jr., et al., 2013) failed to calculate for the following reasons:   The patient has a prior MI or stroke diagnosis   Patient has failed these meds in past: n/a Patient is currently uncontrolled on the following medications:  . Simvastatin 20 mg HS . Aspirin 81 mg daily  We discussed:  diet and exercise extensively; Cholesterol goals; benefits of statin for ASCVD risk reduction; pt saw neurologist in October and per note "Next: Lipitor 20 mg daily", no change made that day likely due to addition of Lexapro for tremor.   Plan  Continue current medications and control with diet and exercise  F/U switch to Lipitor 20 mg  Diabetes   A1c goal <7%  Recent Relevant Labs: Lab Results  Component Value Date/Time   HGBA1C 5.9 11/13/2019 02:18 PM   HGBA1C 5.8 (A) 07/06/2019 08:27 AM   HGBA1C 6.3 (H) 03/23/2019  03:21 AM   GFR 83.39 11/13/2019 02:18 PM   GFR 62.49 04/26/2019 03:48 PM   MICROALBUR 8.2 (H) 07/12/2018 09:38 AM   MICROALBUR 7.2 (H) 05/06/2016 09:51 AM    Last diabetic Eye exam:  Lab Results  Component Value Date/Time  HMDIABEYEEXA No Retinopathy 07/26/2019 12:00 AM    Last diabetic Foot exam: No results found for: HMDIABFOOTEX   Patient has failed these meds in past: n/a Patient is currently controlled on the following medications: Marland Kitchen Metformin 1000 mg once daily  We discussed: diet and exercise extensively; A1c goals; pt has been taking metformin once daily and A1c is at goal; advised to continue taking it this way  Plan  Continue current medications and control with diet and exercise  Essential tremor   Patient has failed these meds in past: n/a Patient is currently controlled on the following medications:  . Primidone 250 mg - 3 tablets AM and 2 tabs PM . Propranolol ER 120 mg daily . Escitalopram 10 mg daily  We discussed:  Benefits of medications for treating tremor; pt was previously instructed to reduce primidone to 2 tab AM and PM but he is still taking 3 tab in AM and having significant daytime sedation - caregiver reports he sleeps all day. Again advised pt to reduce primidone dose to see if this improves daytime sedation  Plan  Recommend to reduce primidone to 2 tabs BID (as previously instructed by neurologist) F/U with neurologist as scheduled  Medication Management   Pt uses [facility] for all medications Uses pill box? Yes Pt endorses 100% compliance  We discussed: Discussed benefits of medication synchronization, packaging and delivery as well as enhanced pharmacist oversight with [facility]. Pt wants to think about changing pharmacies and will contact office with decision.  Plan  Continue current medication management strategy    Follow up: 2 month phone visit   Charlene Brooke, PharmD, BCACP Clinical Pharmacist [facility] [phone]

## 2020-06-05 ENCOUNTER — Telehealth: Payer: Self-pay | Admitting: Pharmacist

## 2020-06-05 NOTE — Progress Notes (Signed)
Chronic Care Management Pharmacy Assistant   Name: Drew Harrington  MRN: [id number] DOB: July 08, 1945  Reason for Encounter: Medication Adherence Call    PCP : Hoyt Koch, MD  Allergies:   Allergies  Allergen Reactions   Sildenafil Other (See Comments)    Caused a headache    Medications: Outpatient Encounter Medications as of 06/05/2020  Medication Sig Note   amLODipine (NORVASC) 10 MG tablet TAKE 1 TABLET(10 MG) BY MOUTH DAILY    aspirin 81 MG EC tablet Take 81 mg by mouth daily.    escitalopram (LEXAPRO) 10 MG tablet Take 10 mg by mouth daily.    hydrALAZINE (APRESOLINE) 25 MG tablet Take 1 tablet (25 mg total) by mouth 3 (three) times daily. 03/17/2020: Takes once daily   losartan-hydrochlorothiazide (HYZAAR) 100-25 MG tablet TAKE 1 TABLET BY MOUTH DAILY    metFORMIN (GLUCOPHAGE) 1000 MG tablet TAKE 1 TABLET(1000 MG) BY MOUTH TWICE DAILY WITH A MEAL (Patient taking differently: Take 1,000 mg by mouth daily with breakfast. )    Multiple Vitamins-Minerals (CENTRUM SILVER PO) Take 1 tablet by mouth 2 (two) times a week.     naproxen sodium (ALEVE) 220 MG tablet Take 220-440 mg by mouth 2 (two) times daily as needed (for pain or headaches).    potassium chloride SA (KLOR-CON) 20 MEQ tablet TAKE 1 TABLET BY MOUTH ONCE DAILY    primidone (MYSOLINE) 250 MG tablet Take 250 mg by mouth See admin instructions. 2 tablets in AM and 2 tablets at bedtime    propranolol ER (INDERAL LA) 120 MG 24 hr capsule TAKE 1 CAPSULE(120 MG) BY MOUTH DAILY    simvastatin (ZOCOR) 20 MG tablet TAKE 1 TABLET(20 MG) BY MOUTH AT BEDTIME    Triamcinolone Acetonide (TRIAMCINOLONE 0.1 % CREAM : EUCERIN) CREA Apply 1 application topically 2 (two) times daily as needed. Disp 1 pound    No facility-administered encounter medications on file as of 06/05/2020.    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 Called to speak with the patient to verify he is taking his medication daily and to make sure he is not having any side effects.  The patient confirmed that he has been taking his simvastatin 20mg  daily at bedtime and he has not had any side effects. He is due for a refill for a 90-day supply on 06/23/2020.   The patient did have a question about his metformin he stated that Dr. [name] decreased his medication from twice daily to once a day. He [facility] requesting a refill but he was instructed to call his PCP office for a new RX due to [facility] still having the metformin written as twice daily.   A message has been sent to Clinical pharmacist to verify the medication change to see if a new RX can get sent to [facility]. The patient has about  2 weeks left of pills.  Will forward information to Clinical pharmacist.   [name], [facility]  Practice Team Manager/ CPA (Clinical Pharmacist Assistant) [phone]

## 2020-06-06 ENCOUNTER — Other Ambulatory Visit: Payer: Self-pay | Admitting: Internal Medicine

## 2020-06-06 DIAGNOSIS — E119 Type 2 diabetes mellitus without complications: Secondary | ICD-10-CM

## 2020-06-06 MED ORDER — METFORMIN HCL 1000 MG PO TABS: 1000.0000 mg | ORAL_TABLET | Freq: Every day | ORAL | 3 refills | Status: DC

## 2020-06-06 NOTE — Telephone Encounter (Signed)
Rx updated and sent to pharmacy

## 2020-07-11 ENCOUNTER — Telehealth: Payer: Medicare Other

## 2020-08-21 DIAGNOSIS — G250 Essential tremor: Secondary | ICD-10-CM | POA: Diagnosis not present

## 2020-09-24 ENCOUNTER — Other Ambulatory Visit: Payer: Self-pay | Admitting: Internal Medicine

## 2020-10-03 ENCOUNTER — Other Ambulatory Visit: Payer: Self-pay | Admitting: Internal Medicine

## 2020-10-03 DIAGNOSIS — I10 Essential (primary) hypertension: Secondary | ICD-10-CM

## 2020-10-05 ENCOUNTER — Other Ambulatory Visit: Payer: Self-pay | Admitting: Internal Medicine

## 2020-11-03 ENCOUNTER — Other Ambulatory Visit: Payer: Self-pay | Admitting: Internal Medicine

## 2020-11-03 DIAGNOSIS — I10 Essential (primary) hypertension: Secondary | ICD-10-CM

## 2020-11-11 ENCOUNTER — Ambulatory Visit (INDEPENDENT_AMBULATORY_CARE_PROVIDER_SITE_OTHER): Payer: Medicare Other | Admitting: Internal Medicine

## 2020-11-11 ENCOUNTER — Encounter: Payer: Self-pay | Admitting: Internal Medicine

## 2020-11-11 ENCOUNTER — Other Ambulatory Visit: Payer: Self-pay

## 2020-11-11 VITALS — BP 140/80 | HR 83 | Temp 98.3°F | Resp 18 | Ht 69.0 in | Wt 193.4 lb

## 2020-11-11 DIAGNOSIS — Z23 Encounter for immunization: Secondary | ICD-10-CM

## 2020-11-11 DIAGNOSIS — E785 Hyperlipidemia, unspecified: Secondary | ICD-10-CM | POA: Diagnosis not present

## 2020-11-11 DIAGNOSIS — I10 Essential (primary) hypertension: Secondary | ICD-10-CM

## 2020-11-11 DIAGNOSIS — E118 Type 2 diabetes mellitus with unspecified complications: Secondary | ICD-10-CM

## 2020-11-11 DIAGNOSIS — E1169 Type 2 diabetes mellitus with other specified complication: Secondary | ICD-10-CM

## 2020-11-11 DIAGNOSIS — Z0000 Encounter for general adult medical examination without abnormal findings: Secondary | ICD-10-CM | POA: Diagnosis not present

## 2020-11-11 DIAGNOSIS — E119 Type 2 diabetes mellitus without complications: Secondary | ICD-10-CM | POA: Diagnosis not present

## 2020-11-11 MED ORDER — LOSARTAN POTASSIUM-HCTZ 100-25 MG PO TABS: 1.0000 | ORAL_TABLET | Freq: Every day | ORAL | 3 refills | Status: DC

## 2020-11-11 MED ORDER — AMLODIPINE BESYLATE 10 MG PO TABS: 10.0000 mg | ORAL_TABLET | Freq: Every day | ORAL | 3 refills | Status: DC

## 2020-11-11 MED ORDER — METFORMIN HCL 1000 MG PO TABS: 1000.0000 mg | ORAL_TABLET | Freq: Every day | ORAL | 3 refills | Status: DC

## 2020-11-11 MED ORDER — POTASSIUM CHLORIDE CRYS ER 20 MEQ PO TBCR: 20.0000 meq | EXTENDED_RELEASE_TABLET | Freq: Every day | ORAL | 3 refills | Status: DC

## 2020-11-11 NOTE — Progress Notes (Signed)
Subjective:   Patient ID: Drew Harrington, male    DOB: 1946/06/27, 75 y.o.   MRN: [id number]  HPI Here for medicare wellness and physical, no new complaints. Please see A/P for status and treatment of chronic medical problems.   Diet: DM since diabetic Physical activity: sedentary Depression/mood screen: negative Hearing: intact to whispered voice, mild loss bilaterally Visual acuity: grossly normal, due soon for annual eye exam  ADLs: capable Fall risk: low Home safety: good Cognitive evaluation: intact to orientation, naming, recall and repetition EOL planning: adv directives discussed  [place] Visit from 11/11/2020 in [place] at [facility] Total Score 0      I have personally reviewed and have noted 1. The patient's medical and social history - reviewed today no changes 2. Their use of alcohol, tobacco or illicit drugs 3. Their current medications and supplements 4. The patient's functional ability including ADL's, fall risks, home safety risks and hearing or visual impairment. 5. Diet and physical activities 6. Evidence for depression or mood disorders 7. Care team reviewed and updated 8.  The patient is not on an opioid pain medication.  Patient Care Team: Hoyt Koch, MD as PCP - General (Internal Medicine) Charlton Haws, [facility] as Pharmacist (Pharmacist) Past Medical History:  Diagnosis Date  . Allergic rhinitis   . Bladder cancer ([place])   . Borderline diabetes   . Cataract    bilateral lens implants  . Diabetes mellitus without complication ([place])   . HTN (hypertension)   . Hyperlipidemia   . Insomnia, unspecified   . Perirectal abscess 05/10/2013  . Personal history of colonic adenomas 04/10/2010  . Psychosexual dysfunction with inhibited sexual excitement   . Stroke ([place])   . Subacute intracranial hemorrhage ([place])   . Unspecified hemorrhoids without mention of complication    Past Surgical History:   Procedure Laterality Date  . CATARACT EXTRACTION W/ INTRAOCULAR LENS  IMPLANT, BILATERAL    . COLONOSCOPY    . removal cyst hip Left 05/23/2013  . TRANSTHORACIC ECHOCARDIOGRAM  04-06-2006   LVSF NORMAL/  EF 60%/ AORTIC ROOT AT UPPER LIMITS OF NORMAL SIZE  . TRANSURETHRAL RESECTION OF BLADDER TUMOR  10/08/2011   Procedure: CIRCUMCISION AND TRANSURETHRAL RESECTION OF BLADDER TUMOR (TURBT);  Surgeon: [name], MD;  Location: [facility];  Service: Urology;  Laterality: N/A;  . TRANSURETHRAL RESECTION OF BLADDER TUMOR  11/30/2011   Procedure: TRANSURETHRAL RESECTION OF BLADDER TUMOR (TURBT);  Surgeon: [name], MD;  Location[facility]  Service: Urology;  Laterality: N/A;   Family History  Problem Relation Age of Onset  . Lung disease Father   . Cancer Father        lung  . Cancer Sister        lung  . Cancer Sister        lung  . Cancer Sister        lung  . Gout Other   . Heart disease Other   . Hypertension Other   . Colon cancer Neg Hx   . Esophageal cancer Neg Hx   . Stomach cancer Neg Hx   . Rectal cancer Neg Hx    Review of Systems  Constitutional: Negative.   HENT: Negative.   Eyes: Negative.   Respiratory: Negative for cough, chest tightness and shortness of breath.   Cardiovascular: Negative for chest pain, palpitations and leg swelling.  Gastrointestinal: Negative for abdominal distention, abdominal pain,  constipation, diarrhea, nausea and vomiting.  Musculoskeletal: Negative.   Skin: Negative.   Neurological: Negative.   Psychiatric/Behavioral: Negative.     Objective:  Physical Exam Constitutional:      Appearance: He is well-developed.  HENT:     Head: Normocephalic and atraumatic.  Cardiovascular:     Rate and Rhythm: Normal rate and regular rhythm.  Pulmonary:     Effort: Pulmonary effort is normal. No respiratory distress.     Breath sounds: Normal breath sounds. No wheezing or rales.   Abdominal:     General: Bowel sounds are normal. There is no distension.     Palpations: Abdomen is soft.     Tenderness: There is no abdominal tenderness. There is no rebound.  Musculoskeletal:     Cervical back: Normal range of motion.  Skin:    General: Skin is warm and dry.  Neurological:     Mental Status: He is alert and oriented to person, place, and time.     Coordination: Coordination normal.     Vitals:   11/11/20 0854  BP: 140/80  Pulse: 83  Resp: 18  Temp: 98.3 F (36.8 C)  TempSrc: Oral  SpO2: 97%  Weight: 193 lb 6.4 oz (87.7 kg)  Height: 5\' 9"  (1.753 m)    This visit occurred during the SARS-CoV-2 public health emergency.  Safety protocols were in place, including screening questions prior to the visit, additional usage of staff PPE, and extensive cleaning of exam room while observing appropriate contact time as indicated for disinfecting solutions.   Assessment & Plan:  Pneumonia 23 given at visit

## 2020-11-11 NOTE — Patient Instructions (Addendum)
Make sure to get the covid-19 booster shot at any pharmacy.   We have given you the pneumonia shot today. You do not have to wait for the covid-19 booster you can get that anytime.  Health Maintenance, Male Adopting a healthy lifestyle and getting preventive care are important in promoting health and wellness. Ask your health care provider about:  The right schedule for you to have regular tests and exams.  Things you can do on your own to prevent diseases and keep yourself healthy. What should I know about diet, weight, and exercise? Eat a healthy diet  Eat a diet that includes plenty of vegetables, fruits, low-fat dairy products, and lean protein.  Do not eat a lot of foods that are high in solid fats, added sugars, or sodium.   Maintain a healthy weight Body mass index (BMI) is a measurement that can be used to identify possible weight problems. It estimates body fat based on height and weight. Your health care provider can help determine your BMI and help you achieve or maintain a healthy weight. Get regular exercise Get regular exercise. This is one of the most important things you can do for your health. Most adults should:  Exercise for at least 150 minutes each week. The exercise should increase your heart rate and make you sweat (moderate-intensity exercise).  Do strengthening exercises at least twice a week. This is in addition to the moderate-intensity exercise.  Spend less time sitting. Even light physical activity can be beneficial. Watch cholesterol and blood lipids Have your blood tested for lipids and cholesterol at 76 years of age, then have this test every 5 years. You may need to have your cholesterol levels checked more often if:  Your lipid or cholesterol levels are high.  You are older than 76 years of age.  You are at high risk for heart disease. What should I know about cancer screening? Many types of cancers can be detected early and may often be  prevented. Depending on your health history and family history, you may need to have cancer screening at various ages. This may include screening for:  Colorectal cancer.  Prostate cancer.  Skin cancer.  Lung cancer. What should I know about heart disease, diabetes, and high blood pressure? Blood pressure and heart disease  High blood pressure causes heart disease and increases the risk of stroke. This is more likely to develop in people who have high blood pressure readings, are of African descent, or are overweight.  Talk with your health care provider about your target blood pressure readings.  Have your blood pressure checked: ? Every 3-5 years if you are 14-40 years of age. ? Every year if you are 21 years old or older.  If you are between the ages of 9 and 40 and are a current or former smoker, ask your health care provider if you should have a one-time screening for abdominal aortic aneurysm (AAA). Diabetes Have regular diabetes screenings. This checks your fasting blood sugar level. Have the screening done:  Once every three years after age 35 if you are at a normal weight and have a low risk for diabetes.  More often and at a younger age if you are overweight or have a high risk for diabetes. What should I know about preventing infection? Hepatitis B If you have a higher risk for hepatitis B, you should be screened for this virus. Talk with your health care provider to find out if you are at risk  for hepatitis B infection. Hepatitis C Blood testing is recommended for:  Everyone born from [birth year] through 1966.  Anyone with known risk factors for hepatitis C. Sexually transmitted infections (STIs)  You should be screened each year for STIs, including gonorrhea and chlamydia, if: ? You are sexually active and are younger than 76 years of age. ? You are older than 76 years of age and your health care provider tells you that you are at risk for this type of  infection. ? Your sexual activity has changed since you were last screened, and you are at increased risk for chlamydia or gonorrhea. Ask your health care provider if you are at risk.  Ask your health care provider about whether you are at high risk for HIV. Your health care provider may recommend a prescription medicine to help prevent HIV infection. If you choose to take medicine to prevent HIV, you should first get tested for HIV. You should then be tested every 3 months for as long as you are taking the medicine. Follow these instructions at home: Lifestyle  Do not use any products that contain nicotine or tobacco, such as cigarettes, e-cigarettes, and chewing tobacco. If you need help quitting, ask your health care provider.  Do not use street drugs.  Do not share needles.  Ask your health care provider for help if you need support or information about quitting drugs. Alcohol use  Do not drink alcohol if your health care provider tells you not to drink.  If you drink alcohol: ? Limit how much you have to 0-2 drinks a day. ? Be aware of how much alcohol is in your drink. In the [place] one drink equals one 12 oz bottle of beer (355 mL), one 5 oz glass of wine (148 mL), or one 1 oz glass of hard liquor (44 mL). General instructions  Schedule regular health, dental, and eye exams.  Stay current with your vaccines.  Tell your health care provider if: ? You often feel depressed. ? You have ever been abused or do not feel safe at home. Summary  Adopting a healthy lifestyle and getting preventive care are important in promoting health and wellness.  Follow your health care provider's instructions about healthy diet, exercising, and getting tested or screened for diseases.  Follow your health care provider's instructions on monitoring your cholesterol and blood pressure. This information is not intended to replace advice given to you by your health care provider. Make sure you  discuss any questions you have with your health care provider. Document Revised: 06/07/2018 Document Reviewed: 06/07/2018 Elsevier Patient Education  2021 [employer].

## 2020-11-14 NOTE — Assessment & Plan Note (Signed)
Foot exam done. Checking HgA1c. Taking metformin 1000 mg daily and statin and ARB. Complicated by past stroke.

## 2020-11-14 NOTE — Assessment & Plan Note (Signed)
Has been off hydralazine for more than a year. Will refill amlodipine and losartan/hctz and continue these. Checking CMP.

## 2020-11-14 NOTE — Assessment & Plan Note (Signed)
Flu shot yearly. Covid-19 booster due counseled. Pneumonia 23 given to complete series. Shingrix counseled to get at pharmacy. Tetanus due 2030. Colonoscopy due 2024. Counseled about sun safety and mole surveillance. Counseled about the dangers of distracted driving. Given 10 year screening recommendations.

## 2020-11-14 NOTE — Assessment & Plan Note (Signed)
Checking lipid panel and adjust as needed. Taking simvastatin 20 mg daily.

## 2020-11-15 ENCOUNTER — Other Ambulatory Visit: Payer: Self-pay | Admitting: Internal Medicine

## 2020-11-15 DIAGNOSIS — I10 Essential (primary) hypertension: Secondary | ICD-10-CM

## 2021-01-01 ENCOUNTER — Telehealth: Payer: Self-pay | Admitting: Pharmacist

## 2021-01-01 NOTE — Progress Notes (Signed)
    Chronic Care Management Pharmacy Assistant   Name: Drew Harrington  MRN: [id number] DOB: 06-04-1946   Reason for Encounter: Disease State   Conditions to be addressed/monitored: Adherence Call   Recent office visits:  11/11/20 Dr. [name]  Recent consult visits:  None ID  Hospital visits:  None in previous 6 months  Medications: Outpatient Encounter Medications as of 01/01/2021  Medication Sig   amLODipine (NORVASC) 10 MG tablet Take 1 tablet (10 mg total) by mouth daily.   aspirin 81 MG EC tablet Take 81 mg by mouth daily.   escitalopram (LEXAPRO) 10 MG tablet Take 10 mg by mouth daily.   losartan-hydrochlorothiazide (HYZAAR) 100-25 MG tablet Take 1 tablet by mouth daily.   metFORMIN (GLUCOPHAGE) 1000 MG tablet Take 1 tablet (1,000 mg total) by mouth daily with breakfast.   Multiple Vitamins-Minerals (CENTRUM SILVER PO) Take 1 tablet by mouth 2 (two) times a week.   naproxen sodium (ALEVE) 220 MG tablet Take 220-440 mg by mouth 2 (two) times daily as needed (for pain or headaches).   potassium chloride SA (KLOR-CON) 20 MEQ tablet Take 1 tablet (20 mEq total) by mouth daily.   primidone (MYSOLINE) 250 MG tablet Take 250 mg by mouth See admin instructions. 2 tablets in AM and 2 tablets at bedtime   propranolol ER (INDERAL LA) 120 MG 24 hr capsule TAKE 1 CAPSULE(120 MG) BY MOUTH DAILY   simvastatin (ZOCOR) 20 MG tablet TAKE 1 TABLET(20 MG) BY MOUTH AT BEDTIME   Triamcinolone Acetonide (TRIAMCINOLONE 0.1 % CREAM : EUCERIN) CREA Apply 1 application topically 2 (two) times daily as needed. Disp 1 pound   No facility-administered encounter medications on file as of 01/01/2021.    Pharmacist Review  Have you had any problems recently with your health? Patient states that he is doing well and the only issue he is having is that he is going to the bathroom a few times a night.  Have you had any problems with your pharmacy? Patient states that he is not having  any problems with getting medications from the pharmacy or the cost of medications. He just switch to [employer] issues or side effects are you having with your medications? Patient states that he is not having any side effects except going to the bath room, not sure if it has to do with medication he is taking  What would you like me to pass along to [employer] for them to help you with?  Patient states that he is doing well and that he is taking his simvastatin as instructed And that he is going to the gym to work out so that he can lose weight .  What can we do to take care of you better?  Patient states that he appreciates the call to see how he is doing  Star Rating Drugs: Losartan-hctz 11/11/20 90 ds Simvastatin 10/13/20 90 ds  [place] Pharmacist Assistant [phone]   Time spent:27

## 2021-01-08 ENCOUNTER — Other Ambulatory Visit: Payer: Self-pay | Admitting: Internal Medicine

## 2021-01-15 DIAGNOSIS — R32 Unspecified urinary incontinence: Secondary | ICD-10-CM | POA: Diagnosis not present

## 2021-01-15 DIAGNOSIS — G3184 Mild cognitive impairment, so stated: Secondary | ICD-10-CM | POA: Diagnosis not present

## 2021-01-15 DIAGNOSIS — G8929 Other chronic pain: Secondary | ICD-10-CM | POA: Diagnosis not present

## 2021-01-15 DIAGNOSIS — Z7982 Long term (current) use of aspirin: Secondary | ICD-10-CM | POA: Diagnosis not present

## 2021-01-15 DIAGNOSIS — K08109 Complete loss of teeth, unspecified cause, unspecified class: Secondary | ICD-10-CM | POA: Diagnosis not present

## 2021-01-15 DIAGNOSIS — Z6827 Body mass index (BMI) 27.0-27.9, adult: Secondary | ICD-10-CM | POA: Diagnosis not present

## 2021-01-15 DIAGNOSIS — I10 Essential (primary) hypertension: Secondary | ICD-10-CM | POA: Diagnosis not present

## 2021-01-15 DIAGNOSIS — E663 Overweight: Secondary | ICD-10-CM | POA: Diagnosis not present

## 2021-01-15 DIAGNOSIS — E785 Hyperlipidemia, unspecified: Secondary | ICD-10-CM | POA: Diagnosis not present

## 2021-01-15 DIAGNOSIS — E1142 Type 2 diabetes mellitus with diabetic polyneuropathy: Secondary | ICD-10-CM | POA: Diagnosis not present

## 2021-02-16 ENCOUNTER — Ambulatory Visit (INDEPENDENT_AMBULATORY_CARE_PROVIDER_SITE_OTHER): Payer: Medicare HMO | Admitting: Pharmacist

## 2021-02-16 ENCOUNTER — Other Ambulatory Visit: Payer: Self-pay

## 2021-02-16 DIAGNOSIS — E785 Hyperlipidemia, unspecified: Secondary | ICD-10-CM

## 2021-02-16 DIAGNOSIS — E1169 Type 2 diabetes mellitus with other specified complication: Secondary | ICD-10-CM

## 2021-02-16 DIAGNOSIS — E119 Type 2 diabetes mellitus without complications: Secondary | ICD-10-CM | POA: Diagnosis not present

## 2021-02-16 DIAGNOSIS — Z8673 Personal history of transient ischemic attack (TIA), and cerebral infarction without residual deficits: Secondary | ICD-10-CM

## 2021-02-16 DIAGNOSIS — I10 Essential (primary) hypertension: Secondary | ICD-10-CM | POA: Diagnosis not present

## 2021-02-16 NOTE — Patient Instructions (Signed)
Visit Information  Phone number for Pharmacist: [phone]   Goals Addressed             This Visit's Progress    Track and Manage My Blood Pressure-Hypertension       Timeframe:  Long-Range Goal Priority:  High Start Date:       02/16/21                      Expected End Date:  06/27/21                     Follow Up Date 02/19/21   - check blood pressure twice daily - choose a place to take my blood pressure (home, clinic or office, retail store) - write blood pressure results in a log or diary    Why is this important?   You won't feel high blood pressure, but it can still hurt your blood vessels.  High blood pressure can cause heart or kidney problems. It can also cause a stroke.  Making lifestyle changes like losing a little weight or eating less salt will help.  Checking your blood pressure at home and at different times of the day can help to control blood pressure.  If the doctor prescribes medicine remember to take it the way the doctor ordered.  Call the office if you cannot afford the medicine or if there are questions about it.     Notes:         Care Plan : [place]  Updates made by Charlton Haws, RPH since 02/16/2021 12:00 AM     Problem: Hypertension, Hyperlipidemia, and Diabetes, Hx Stroke   Priority: High     Long-Range Goal: Disease management   Start Date: 02/16/2021  Expected End Date: 02/16/2022  This Visit's Progress: On track  Priority: High  Note:   Current Barriers:  Unable to independently monitor therapeutic efficacy Unable to maintain control of HTN  Pharmacist Clinical Goal(s):  Patient will achieve adherence to monitoring guidelines and medication adherence to achieve therapeutic efficacy maintain control of HTN as evidenced by home BP  through collaboration with PharmD and provider.   Interventions: 1:1 collaboration with Hoyt Koch, MD regarding development and update of comprehensive plan of care  as evidenced by provider attestation and co-signature Inter-disciplinary care team collaboration (see longitudinal plan of care) Comprehensive medication review performed; medication list updated in electronic medical record  Hypertension    BP goal is:  <140/90  Patient checks BP at home: occasionally Patient home BP readings are ranging: 3x while on phone today - 190/89, 208/127, 156/87   Patient has failed these meds in the past: n/a Patient is currently controlled on the following medications:  Amlodipine 10 mg daily PM Losartan-HCTZ 100-25 mg daily PM Propranolol ER 120 mg daily - not taking   We discussed: BP goals; pt states he drank a caffeinated Pepsi today and that is why BP is high; counseled on dangers of high BP and when to seek emergency care; counseled on factors that can worsen BP - caffeine, smoking, NSAIDS, pain, stress etc. Pt does not endorse any significant change in diet, OTC medications or stress levels recently -Pt reports BP monitor is ~76 year old; counseled on proper BP technique  -pt went to medicine cabinet to check bottles - he is taking amlodipine and losartan-HCTZ as prescribed, but he does not have propranolol and does not think he has taken that for  a few months -Contacted [facility] to refill propranolol - the tech states the Rx for propranolol was inactive for some reason, requested re-activtation of rx and refill today; -Advised pt to pick up propranolol refill today and start taking with the rest of his meds   Plan: Restart propranolol ER 120 mg daily -Pt to check BP BID for next 2-3 weeks -F/U call 2-3 days to check on BP   Hyperlipidemia    LDL goal < 70 Hx stroke Sept 2020 - ischemic and hemorrhagic components LDL steadily increasing 90 > 132 since 2019   Patient has failed these meds in past: n/a Patient is currently uncontrolled on the following medications:  Simvastatin 20 mg HS Aspirin 81 mg daily HS   We discussed:  LDL is above goal  but lipid panel is > 84 year old; pt did not stay for lab work during recent PCP visit May 2022; he has appt upcoming in Nov 2022   Plan:  -Repeat lipid panel @ Nov 2022 PCP visit -Consider change to high-intensity statin   Diabetes    A1c goal <7%   Patient has failed these meds in past: n/a Patient is currently controlled on the following medications: Metformin 1000 mg once daily   We discussed: A1c is at goal; pt endorses compliance with metformin   Plan: Continue current medications and control with diet and exercise   Patient Goals/Self-Care Activities Patient will:  - take medications as prescribed focus on medication adherence by routine check blood pressure twice a day, document, and provide at future appointments engage in dietary modifications by reduce caffeine, salt -Restart propranolol ER 120 mg daily      The patient verbalized understanding of instructions, educational materials, and care plan provided today and declined offer to receive copy of patient instructions, educational materials, and care plan.  The pharmacy team will reach out to the patient again over the next 3 days.   Charlene Brooke, PharmD, [place], CPP Clinical Pharmacist [facility] [phone]

## 2021-02-16 NOTE — Progress Notes (Signed)
Chronic Care Management Pharmacy Note  02/16/2021 Name:  Drew Harrington MRN:  [id number] DOB:  1945/08/20  Summary: -Pt checked BP while on call today and it was very high (190/89, 208/127, 156/87). He did drink caffeinated Pepsi shortly before the call. He is asymptomatic. He declined office visit, urgent care or emergency visit. BP was reasonable 156/87 by the end of the call.  -Verified prescription bottles with patient over phone - he has not been taking propranolol for a few months now, but is compliant with everything else.  Recommendations/Changes made from today's visit: -Contacted [facility] to refill Propranolol today. Pt agreed to pick up medication and restart it tonight. -Pt will check BP twice daily. F/U call with CCM team scheduled for 3 days. -Recommended pt call if BP is >170/100 again.   Subjective: Drew Harrington is an 75 y.o. year old male who is a primary patient of Hoyt Koch, MD.  The CCM team was consulted for assistance with disease management and care coordination needs.    Engaged with patient by telephone for follow up visit in response to provider referral for pharmacy case management and/or care coordination services.   Consent to Services:  The patient was given information about Chronic Care Management services, agreed to services, and gave verbal consent prior to initiation of services.  Please see initial visit note for detailed documentation.   Patient Care Team: Hoyt Koch, MD as PCP - General (Internal Medicine) Charlton Haws, [facility] as Pharmacist (Pharmacist)  Recent office visits: 11/11/20 Dr [name] OV: CPE. Pt off hydralazine. No med changes. Pt did not stay for labs.  Recent consult visits: 08/21/20 Dr Everette Rank [facility] neurology): f/u essentrial tremor, CVA. Trial escitalopram  Hospital visits: None in previous 6 months   Objective:  Lab Results  Component Value Date   CREATININE 1.05 11/13/2019   BUN 13  11/13/2019   GFR 83.39 11/13/2019   GFRNONAA >60 03/24/2019   GFRAA >60 03/24/2019   NA 139 11/13/2019   K 3.5 11/13/2019   CALCIUM 9.8 11/13/2019   CO2 32 11/13/2019   GLUCOSE 113 (H) 11/13/2019    Lab Results  Component Value Date/Time   HGBA1C 5.9 11/13/2019 02:18 PM   HGBA1C 5.8 (A) 07/06/2019 08:27 AM   HGBA1C 6.3 (H) 03/23/2019 03:21 AM   GFR 83.39 11/13/2019 02:18 PM   GFR 62.49 04/26/2019 03:48 PM   MICROALBUR 8.2 (H) 07/12/2018 09:38 AM   MICROALBUR 7.2 (H) 05/06/2016 09:51 AM    Last diabetic Eye exam:  Lab Results  Component Value Date/Time   HMDIABEYEEXA No Retinopathy 07/26/2019 12:00 AM    Last diabetic Foot exam: No results found for: HMDIABFOOTEX   Lab Results  Component Value Date   CHOL 212 (H) 11/13/2019   HDL 56.10 11/13/2019   LDLCALC 132 (H) 11/13/2019   LDLDIRECT 173.8 01/10/2013   TRIG 120.0 11/13/2019   CHOLHDL 4 11/13/2019    Hepatic Function Latest Ref Rng & Units 11/13/2019 04/26/2019 03/22/2019  Total Protein 6.0 - 8.3 g/dL 7.5 7.4 7.2  Albumin 3.5 - 5.2 g/dL 4.2 4.3 3.6  AST 0 - 37 U/L _0 ALT 0 - 53 U/L _1 Alk Phosphatase 39 - 117 U/L 182(H) 198(H) 204(H)  Total Bilirubin 0.2 - 1.2 mg/dL 0.3 0.4 0.4  Bilirubin, Direct 0.0 - 0.3 mg/dL - - -    Lab Results  Component Value Date/Time   TSH 1.76 11/28/2014 11:15 AM  TSH 2.09 06/23/2009 12:32 PM    CBC Latest Ref Rng & Units 11/13/2019 04/26/2019 03/23/2019  WBC 4.0 - 10.5 K/uL 8.3 10.5 6.8  Hemoglobin 13.0 - 17.0 g/dL 16.0 15.7 16.1  Hematocrit 39.0 - 52.0 % 47.4 46.3 47.5  Platelets 150.0 - 400.0 K/uL 254.0 251.0 260    No results found for: VD25OH  Clinical ASCVD: Yes  The ASCVD Risk score Mikey Bussing DC Jr., et al., 2013) failed to calculate for the following reasons:   The patient has a prior MI or stroke diagnosis    Depression screen [facility] 2/9 11/11/2020 07/12/2018 01/13/2017  Decreased Interest 0 0 0  Down, Depressed, Hopeless 0 0 0  PHQ - 2 Score 0 0 0       Social History   Tobacco Use  Smoking Status Former  Smokeless Tobacco Current   Types: Chew  Tobacco Comments   last used 3 weeks ago   BP Readings from Last 3 Encounters:  11/11/20 140/80  03/25/20 (!) 160/88  11/13/19 (!) 144/86   Pulse Readings from Last 3 Encounters:  11/11/20 83  03/25/20 78  11/13/19 76   Wt Readings from Last 3 Encounters:  11/11/20 193 lb 6.4 oz (87.7 kg)  03/25/20 191 lb (86.6 kg)  11/13/19 184 lb (83.5 kg)   BMI Readings from Last 3 Encounters:  11/11/20 28.56 kg/m  03/25/20 28.21 kg/m  11/13/19 27.17 kg/m    Assessment/Interventions: Review of patient past medical history, allergies, medications, health status, including review of consultants reports, laboratory and other test data, was performed as part of comprehensive evaluation and provision of chronic care management services.   SDOH:  (Social Determinants of Health) assessments and interventions performed: Yes  SDOH Screenings   Alcohol Screen: Not on file  Depression (PHQ2-9): Low Risk    PHQ-2 Score: 0  Financial Resource Strain: Low Risk    Difficulty of Paying Living Expenses: Not hard at all  Food Insecurity: Not on file  Housing: Not on file  Physical Activity: Not on file  Social Connections: Not on file  Stress: Not on file  Tobacco Use: High Risk   Smoking Tobacco Use: Former   Smokeless Tobacco Use: Current  Transportation Needs: Not on file    [place]  Allergies  Allergen Reactions   Sildenafil Other (See Comments)    Caused a headache    Medications Reviewed Today     Reviewed by Charlton Haws, [facility] (Pharmacist) on 02/16/21 at [place] List Status: <None>   Medication Order Taking? Sig Documenting Provider Last Dose Status Informant  amLODipine (NORVASC) 10 MG tablet [id number] Yes Take 1 tablet (10 mg total) by mouth daily. Hoyt Koch, MD Taking Active   aspirin 81 MG EC tablet [id number] Yes Take 81 mg by mouth daily.  [provider] Taking Active   losartan-hydrochlorothiazide (HYZAAR) 100-25 MG tablet [id number] Yes Take 1 tablet by mouth daily. Hoyt Koch, MD Taking Active   metFORMIN (GLUCOPHAGE) 1000 MG tablet [id number] Yes Take 1 tablet (1,000 mg total) by mouth daily with breakfast. Hoyt Koch, MD Taking Active   Multiple Vitamins-Minerals (CENTRUM SILVER PO) [id number] Yes Take 1 tablet by mouth 2 (two) times a week. [provider] Taking Active Multiple Informants  naproxen sodium (ALEVE) 220 MG tablet [id number] Yes Take 220-440 mg by mouth 2 (two) times daily as needed (for pain or headaches). [provider] Taking Active Self  potassium chloride SA (KLOR-CON) 20 MEQ tablet  [id number] Yes Take 1 tablet (20 mEq total) by mouth daily. Hoyt Koch, MD Taking Active   primidone (MYSOLINE) 250 MG tablet [id number] No Take 250 mg by mouth See admin instructions. 2 tablets in AM and 2 tablets at bedtime  Patient not taking: Reported on 02/16/2021   [provider] Not Taking Active Multiple Informants           Med Note Charlton Haws   Mon Mar 17, 2020 10:00 AM)    propranolol ER (INDERAL LA) 120 MG 24 hr capsule [id number] Yes TAKE 1 CAPSULE(120 MG) BY MOUTH DAILY Hoyt Koch, MD Taking Active   simvastatin (ZOCOR) 20 MG tablet [id number] Yes TAKE 1 TABLET(20 MG) BY MOUTH AT BEDTIME Hoyt Koch, MD Taking Active   Triamcinolone Acetonide (TRIAMCINOLONE 0.1 % CREAM : EUCERIN) CREA [id number] Yes Apply 1 application topically 2 (two) times daily as needed. Disp 1 pound Gregor Hams, MD Taking Active             Patient Active Problem List   Diagnosis Date Noted   Shoulder pain 07/06/2019   Tremor, essential 02/27/2014   Routine health maintenance 01/11/2013   History of colonic polyps 04/10/2010   CATARACT, SENILE, BILATERAL 06/24/2009   Hyperlipidemia associated with type 2 diabetes mellitus ([place]) 11/17/2007    Diabetes mellitus type 2 with complications ([place]) 94/12/6806   IMPOTENCE 11/16/2007   Essential hypertension, malignant 11/16/2007   HEMORRHOIDS 11/16/2007    Immunization History  Administered Date(s) Administered   Fluad Quad(high Dose 65+) 03/24/2019   Influenza Whole 04/08/2009   Influenza, High Dose Seasonal PF 03/23/2016, 03/22/2017   Influenza-Unspecified 03/17/2018   PFIZER(Purple Top)SARS-COV-2 Vaccination 08/04/2019, 08/25/2019   Pneumococcal Polysaccharide-23 11/11/2020   Td 08/30/2007   Tdap 07/12/2018    Conditions to be addressed/monitored:  Hypertension, Hyperlipidemia, and Diabetes, Hx Stroke  Care Plan : [place]  Updates made by Charlton Haws, [place] since 02/16/2021 12:00 AM     Problem: Hypertension, Hyperlipidemia, and Diabetes, Hx Stroke   Priority: High     Long-Range Goal: Disease management   Start Date: 02/16/2021  Expected End Date: 02/16/2022  This Visit's Progress: On track  Priority: High  Note:   Current Barriers:  Unable to independently monitor therapeutic efficacy Unable to maintain control of HTN  Pharmacist Clinical Goal(s):  Patient will achieve adherence to monitoring guidelines and medication adherence to achieve therapeutic efficacy maintain control of HTN as evidenced by home BP  through collaboration with PharmD and provider.   Interventions: 1:1 collaboration with Hoyt Koch, MD regarding development and update of comprehensive plan of care as evidenced by provider attestation and co-signature Inter-disciplinary care team collaboration (see longitudinal plan of care) Comprehensive medication review performed; medication list updated in electronic medical record  Hypertension    BP goal is:  <140/90  Patient checks BP at home: occasionally Patient home BP readings are ranging: 3x while on phone today - 190/89, 208/127, 156/87   Patient has failed these meds in the past: n/a Patient is currently  controlled on the following medications:  Amlodipine 10 mg daily PM Losartan-HCTZ 100-25 mg daily PM Propranolol ER 120 mg daily - not taking   We discussed: BP goals; pt states he drank a caffeinated Pepsi today and that is why BP is high; counseled on dangers of high BP and when to seek emergency care; counseled on factors that can worsen BP - caffeine, smoking, NSAIDS, pain, stress etc. Pt  does not endorse any significant change in diet, OTC medications or stress levels recently -Pt reports BP monitor is ~76 year old; counseled on proper BP technique  -pt went to medicine cabinet to check bottles - he is taking amlodipine and losartan-HCTZ as prescribed, but he does not have propranolol and does not think he has taken that for a few months -Contacted [facility] to refill [facility]anolol - the tech states the Rx for propranolol was inactive for some reason, requested re-activtation of rx and refill today; -Advised pt to pick up propranolol refill today and start taking with the rest of his meds   Plan: Restart propranolol ER 120 mg daily -Pt to check BP BID for next 2-3 weeks -F/U call 2-3 days to check on BP   Hyperlipidemia    LDL goal < 70 Hx stroke Sept 2020 - ischemic and hemorrhagic components LDL steadily increasing 90 > 132 since 2019   Patient has failed these meds in past: n/a Patient is currently uncontrolled on the following medications:  Simvastatin 20 mg HS Aspirin 81 mg daily HS   We discussed:  LDL is above goal but lipid panel is > 82 year old; pt did not stay for lab work during recent PCP visit May 2022; he has appt upcoming in Nov 2022   Plan:  -Repeat lipid panel @ Nov 2022 PCP visit -Consider change to high-intensity statin   Diabetes    A1c goal <7%   Patient has failed these meds in past: n/a Patient is currently controlled on the following medications: Metformin 1000 mg once daily   We discussed: A1c is at goal; pt endorses compliance with metformin    Plan: Continue current medications and control with diet and exercise   Patient Goals/Self-Care Activities Patient will:  - take medications as prescribed focus on medication adherence by routine check blood pressure twice a day, document, and provide at future appointments engage in dietary modifications by reduce caffeine, salt -Restart propranolol ER 120 mg daily      Medication Assistance: None required.  Patient affirms current coverage meets needs.  Compliance/Adherence/Medication fill history: Care Gaps: A1c (due 05/15/20) Eye exam (due 07/25/20) Vaccines - covid booster, Shingrix  Star-Rating Drugs: Simvastatin - LF 10/13/20 x 90 ds Losartan-hctz - LF 11/11/20 x 90 ds Metformin - LF 11/11/20 x 90 ds  Patient's preferred pharmacy is:  [id number] [address], [address] AT [facility] [address] [place] [address] Phone: [phone] Fax: [place], [place] [place]. [place] [address] Phone: [phone] Fax: [phone]  Uses pill box? Yes Pt endorses 100% compliance  We discussed: Current pharmacy is preferred with insurance plan and patient is satisfied with pharmacy services Patient decided to: Continue current medication management strategy  Care Plan and Follow Up Patient Decision:  Patient agrees to Care Plan and Follow-up.  Plan: Telephone follow up appointment with care management team member scheduled for:  1 week  Charlene Brooke, PharmD, [place], CPP Clinical Pharmacist [facility] [phone]

## 2021-02-19 ENCOUNTER — Telehealth: Payer: Self-pay | Admitting: Pharmacist

## 2021-02-19 NOTE — Progress Notes (Signed)
Chronic Care Management Pharmacy Assistant   Name: Drew Harrington  MRN: [id number] DOB: July 01, 1945   Reason for Encounter: Disease State   Conditions to be addressed/monitored: HTN   Recent office visits:  None ID  Recent consult visits:  None ID  Hospital visits:  None in previous 6 months  Medications: Outpatient Encounter Medications as of 02/19/2021  Medication Sig   amLODipine (NORVASC) 10 MG tablet Take 1 tablet (10 mg total) by mouth daily.   aspirin 81 MG EC tablet Take 81 mg by mouth daily.   losartan-hydrochlorothiazide (HYZAAR) 100-25 MG tablet Take 1 tablet by mouth daily.   metFORMIN (GLUCOPHAGE) 1000 MG tablet Take 1 tablet (1,000 mg total) by mouth daily with breakfast.   Multiple Vitamins-Minerals (CENTRUM SILVER PO) Take 1 tablet by mouth 2 (two) times a week.   naproxen sodium (ALEVE) 220 MG tablet Take 220-440 mg by mouth 2 (two) times daily as needed (for pain or headaches).   potassium chloride SA (KLOR-CON) 20 MEQ tablet Take 1 tablet (20 mEq total) by mouth daily.   primidone (MYSOLINE) 250 MG tablet Take 250 mg by mouth See admin instructions. 2 tablets in AM and 2 tablets at bedtime (Patient not taking: Reported on 02/16/2021)   propranolol ER (INDERAL LA) 120 MG 24 hr capsule TAKE 1 CAPSULE(120 MG) BY MOUTH DAILY   simvastatin (ZOCOR) 20 MG tablet TAKE 1 TABLET(20 MG) BY MOUTH AT BEDTIME   Triamcinolone Acetonide (TRIAMCINOLONE 0.1 % CREAM : EUCERIN) CREA Apply 1 application topically 2 (two) times daily as needed. Disp 1 pound   No facility-administered encounter medications on file as of 02/19/2021.    Recent Office Vitals: BP Readings from Last 3 Encounters:  11/11/20 140/80  03/25/20 (!) 160/88  11/13/19 (!) 144/86   Pulse Readings from Last 3 Encounters:  11/11/20 83  03/25/20 78  11/13/19 76    Wt Readings from Last 3 Encounters:  11/11/20 193 lb 6.4 oz (87.7 kg)  03/25/20 191 lb (86.6 kg)  11/13/19 184 lb (83.5 kg)     Kidney  Function Lab Results  Component Value Date/Time   CREATININE 1.05 11/13/2019 02:18 PM   CREATININE 1.35 04/26/2019 03:48 PM   GFR 83.39 11/13/2019 02:18 PM   GFRNONAA >60 03/24/2019 03:17 AM   GFRAA >60 03/24/2019 03:17 AM    BMP Latest Ref Rng & Units 11/13/2019 04/26/2019 03/24/2019  Glucose 70 - 99 mg/dL 113(H) 99 103(H)  BUN 6 - 23 mg/dL 13 20 13   Creatinine 0.40 - 1.50 mg/dL 1.05 1.35 1.10  Sodium 135 - 145 mEq/L 139 141 140  Potassium 3.5 - 5.1 mEq/L 3.5 3.7 3.4(L)  Chloride 96 - 112 mEq/L 101 103 100  CO2 19 - 32 mEq/L 32 28 27  Calcium 8.4 - 10.5 mg/dL 9.8 9.8 9.9     Contacted patient on 02/19/21 to discuss hypertension disease state  Current antihypertensive regimen:  Amlodipine 10 mg daily PM Losartan-HCTZ 100-25 mg daily PM Propranolol ER 120 mg daily (Patient states that this medication cost $210, he would like something else less expensive to take)  Patient verbally confirms he is taking the above medications as directed. Yes  How often are you checking your Blood Pressure? daily  he checks his blood pressure in the morning before taking his medication.  Current home BP readings: Patient states that he has been logging his blood pressure readings  DATE:             BP  PULSE        02/17/21  139/89        02/18/21  152/96         02/19/21  151/98  Wrist or arm cuff:Patient use ar cuff Caffeine intake:Patient states he drinks zero calories soda Salt intake:Patient states he has not cut back on salt intake OTC medications including pseudoephedrine or NSAIDs?  Any readings above 180/120? No If yes any symptoms of hypertensive emergency? patient denies any symptoms of high blood pressure   What recent interventions/DTPs have been made by any provider to improve Blood Pressure control since last CPP Visit: restart propranolol per clinical pharmacist  Any recent hospitalizations or ED visits since last visit with CPP? No  What diet changes have  been made to improve Blood Pressure Control?  Patient states that he has not made any changes to his diet  What exercise is being done to improve your Blood Pressure Control?  Patient states that he has started going to the gym to exercise  Adherence Review: Is the patient currently on ACE/ARB medication? Yes Does the patient have >5 day gap between last estimated fill dates? Yes   Star Rating Drugs:  Medication:  Last Fill: Day Supply Losartan-hctz 100-25 mg5/17/22 90  (Patient has 12 tabs on hand) Metformin 1000 mg 01/09/21 90 Simvastatin 20 mg 10/13/20 90   (Patient has 6 tabs on hand)  Care Gaps: Last annual wellness visit:05/06/16   CCM appointment on 03/09/21    [place] Pharmacist Assistant [phone]   Time spent:40

## 2021-03-09 ENCOUNTER — Other Ambulatory Visit: Payer: Self-pay

## 2021-03-09 ENCOUNTER — Ambulatory Visit (INDEPENDENT_AMBULATORY_CARE_PROVIDER_SITE_OTHER): Payer: Medicare HMO | Admitting: Pharmacist

## 2021-03-09 DIAGNOSIS — I10 Essential (primary) hypertension: Secondary | ICD-10-CM

## 2021-03-09 DIAGNOSIS — E1169 Type 2 diabetes mellitus with other specified complication: Secondary | ICD-10-CM

## 2021-03-09 DIAGNOSIS — G250 Essential tremor: Secondary | ICD-10-CM

## 2021-03-09 DIAGNOSIS — Z8673 Personal history of transient ischemic attack (TIA), and cerebral infarction without residual deficits: Secondary | ICD-10-CM

## 2021-03-09 DIAGNOSIS — E119 Type 2 diabetes mellitus without complications: Secondary | ICD-10-CM

## 2021-03-09 MED ORDER — METOPROLOL SUCCINATE ER 25 MG PO TB24: 25.0000 mg | ORAL_TABLET | Freq: Every day | ORAL | 0 refills | Status: DC

## 2021-03-09 NOTE — Patient Instructions (Signed)
Visit Information  Phone number for Pharmacist: [phone]   Goals Addressed             This Visit's Progress    Track and Manage My Blood Pressure-Hypertension       Timeframe:  Long-Range Goal Priority:  High Start Date:       02/16/21                      Expected End Date:  06/27/21                     Follow Up Date Oct 2022   - check blood pressure 3 x a week - choose a place to take my blood pressure (home, clinic or office, retail store) - write blood pressure results in a log or diary  - Start Metoprolol succinate 25 mg daily   Why is this important?   You won't feel high blood pressure, but it can still hurt your blood vessels.  High blood pressure can cause heart or kidney problems. It can also cause a stroke.  Making lifestyle changes like losing a little weight or eating less salt will help.  Checking your blood pressure at home and at different times of the day can help to control blood pressure.  If the doctor prescribes medicine remember to take it the way the doctor ordered.  Call the office if you cannot afford the medicine or if there are questions about it.     Notes:         Care Plan : [place]  Updates made by Charlton Haws, RPH since 03/09/2021 12:00 AM     Problem: Hypertension, Hyperlipidemia, and Diabetes, Hx Stroke, essential tremor   Priority: High     Long-Range Goal: Disease management   Start Date: 02/16/2021  Expected End Date: 02/16/2022  This Visit's Progress: On track  Recent Progress: On track  Priority: High  Note:   Current Barriers:  Unable to independently monitor therapeutic efficacy Unable to maintain control of HTN  Pharmacist Clinical Goal(s):  Patient will achieve adherence to monitoring guidelines and medication adherence to achieve therapeutic efficacy maintain control of HTN as evidenced by home BP  through collaboration with PharmD and provider.   Interventions: 1:1 collaboration with  Hoyt Koch, MD regarding development and update of comprehensive plan of care as evidenced by provider attestation and co-signature Inter-disciplinary care team collaboration (see longitudinal plan of care) Comprehensive medication review performed; medication list updated in electronic medical record  Hypertension    BP goal is:  <140/90  Patient checks BP at home: 3x a week Patient home BP readings are ranging: 139/98, 152/96, 151/98   Patient has failed these meds in the past: n/a Patient is currently controlled on the following medications:  Amlodipine 10 mg daily PM Losartan-HCTZ 100-25 mg daily PM Propranolol ER 120 mg daily - not taking (>$200)   We discussed: BP is improved since last CCM call, still above goal at times; pt was not able to afford propranolol - per insurance formulary it is Tier 2 drug, the preferred Tier 1 beta blockers are atenolol, carvedilol and metoprolol; pt would benefit from continued beta blocker use given uncontrolled essential tremor; metoprolol is not FDA-indicated for tremor but has shown similar reduction in tremor to propranolol in small studies.   Plan: Switch propranolol to metoprolol succinate 25 mg daily   Hyperlipidemia    LDL goal < 70  Hx stroke Sept 2020 - ischemic and hemorrhagic components LDL steadily increasing 90 > 132 since 2019   Patient has failed these meds in past: n/a Patient is currently uncontrolled on the following medications:  Simvastatin 20 mg HS Aspirin 81 mg daily HS   We discussed:  LDL is above goal but lipid panel is > 6 year old; pt did not stay for lab work during recent PCP visit May 2022; he has appt upcoming in Nov 2022   Plan:  -Repeat lipid panel @ Nov 2022 PCP visit -Consider change to high-intensity statin   Diabetes    A1c goal <7%   Patient has failed these meds in past: n/a Patient is currently controlled on the following medications: Metformin 1000 mg once daily   We discussed: A1c  is at goal; pt endorses compliance with metformin   Plan: Continue current medications and control with diet and exercise  Tremor  -Not ideally controlled - pt reports primidone is not doing much for tremor and he falls asleep in his chair a lot in the day time, causing issues with his wife who is worried about side effects -Current treatment  Primidone 250 mg - 3 tab AM, 4 tab HS -Advised pt to contact neurologist regarding primidone -Also starting metoprolol as above  Patient Goals/Self-Care Activities Patient will:  - take medications as prescribed -focus on medication adherence by routine -check blood pressure 3x a week, document, and provide at future appointments -engage in dietary modifications by reduce caffeine, salt -Switch propranolol to metoprolol succinate 25 mg daily      The patient verbalized understanding of instructions, educational materials, and care plan provided today and declined offer to receive copy of patient instructions, educational materials, and care plan.  Telephone follow up appointment with pharmacy team member scheduled for: 1 month  Charlene Brooke, PharmD, [place], CPP Clinical Pharmacist [facility] [phone]

## 2021-03-09 NOTE — Progress Notes (Signed)
Chronic Care Management Pharmacy Note  03/09/2021 Name:  Drew Harrington MRN:  [id number] DOB:  1946-02-28  Summary: -Pt reports home BP is improved since last call: 139/98-152/96 -Pt did not pick up propranolol ER due to high cost ($200 / 90 ds). Per formulary propranolol is Tier 2. The preferred Tier 1 beta blocker is metoprolol. -Pt reports primidone is not helpful for tremor and causing drowsiness. -Pt is due for repeat lipid panel (last 10/2019)  Recommendations/Changes made from today's visit: -Switch propranolol to metoprolol succinate 25 mg daily for cost savings and potentially help with tremor -Advised pt to contact neurologist re: primidone -Advised pt to contact PCP if BP is sustained > 170/100 -Recommend repeat lipid panel @ Nov PCP appt. Consider changing to high intensity statin if LDL above goal.   Subjective: Drew Harrington is an 75 y.o. year old male who is a primary patient of Hoyt Koch, MD.  The CCM team was consulted for assistance with disease management and care coordination needs.    Engaged with patient by telephone for follow up visit in response to provider referral for pharmacy case management and/or care coordination services.   Consent to Services:  The patient was given information about Chronic Care Management services, agreed to services, and gave verbal consent prior to initiation of services.  Please see initial visit note for detailed documentation.   Patient Care Team: Hoyt Koch, MD as PCP - General (Internal Medicine) Charlton Haws, [facility] as Pharmacist (Pharmacist)  Recent office visits: 11/11/20 Dr [name] OV: CPE. Pt off hydralazine. No med changes. Pt did not stay for labs.  Recent consult visits: 08/21/20 Dr [facility] neurology): f/u essentrial tremor, CVA. Trial escitalopram  Hospital visits: None in previous 6 months   Objective:  Lab Results  Component Value Date   CREATININE 1.05 11/13/2019    BUN 13 11/13/2019   GFR 83.39 11/13/2019   GFRNONAA >60 03/24/2019   GFRAA >60 03/24/2019   NA 139 11/13/2019   K 3.5 11/13/2019   CALCIUM 9.8 11/13/2019   CO2 32 11/13/2019   GLUCOSE 113 (H) 11/13/2019    Lab Results  Component Value Date/Time   HGBA1C 5.9 11/13/2019 02:18 PM   HGBA1C 5.8 (A) 07/06/2019 08:27 AM   HGBA1C 6.3 (H) 03/23/2019 03:21 AM   GFR 83.39 11/13/2019 02:18 PM   GFR 62.49 04/26/2019 03:48 PM   MICROALBUR 8.2 (H) 07/12/2018 09:38 AM   MICROALBUR 7.2 (H) 05/06/2016 09:51 AM    Last diabetic Eye exam:  Lab Results  Component Value Date/Time   HMDIABEYEEXA No Retinopathy 07/26/2019 12:00 AM    Last diabetic Foot exam: No results found for: HMDIABFOOTEX   Lab Results  Component Value Date   CHOL 212 (H) 11/13/2019   HDL 56.10 11/13/2019   LDLCALC 132 (H) 11/13/2019   LDLDIRECT 173.8 01/10/2013   TRIG 120.0 11/13/2019   CHOLHDL 4 11/13/2019    Hepatic Function Latest Ref Rng & Units 11/13/2019 04/26/2019 03/22/2019  Total Protein 6.0 - 8.3 g/dL 7.5 7.4 7.2  Albumin 3.5 - 5.2 g/dL 4.2 4.3 3.6  AST 0 - 37 U/L $Remo'18 21 20  'twptd$ ALT 0 - 53 U/L $Remo'24 31 31  'yiNMh$ Alk Phosphatase 39 - 117 U/L 182(H) 198(H) 204(H)  Total Bilirubin 0.2 - 1.2 mg/dL 0.3 0.4 0.4  Bilirubin, Direct 0.0 - 0.3 mg/dL - - -    Lab Results  Component Value Date/Time   TSH 1.76 11/28/2014 11:15 AM  TSH 2.09 06/23/2009 12:32 PM    CBC Latest Ref Rng & Units 11/13/2019 04/26/2019 03/23/2019  WBC 4.0 - 10.5 K/uL 8.3 10.5 6.8  Hemoglobin 13.0 - 17.0 g/dL 16.0 15.7 16.1  Hematocrit 39.0 - 52.0 % 47.4 46.3 47.5  Platelets 150.0 - 400.0 K/uL 254.0 251.0 260    No results found for: VD25OH  Clinical ASCVD: Yes  The ASCVD Risk score (Arnett DK, et al., 2019) failed to calculate for the following reasons:   The patient has a prior MI or stroke diagnosis    Depression screen [facility] 2/9 11/11/2020 07/12/2018 01/13/2017  Decreased Interest 0 0 0  Down, Depressed, Hopeless 0 0 0  PHQ - 2 Score 0 0 0       Social History   Tobacco Use  Smoking Status Former  Smokeless Tobacco Current   Types: Chew  Tobacco Comments   last used 3 weeks ago   BP Readings from Last 3 Encounters:  11/11/20 140/80  03/25/20 (!) 160/88  11/13/19 (!) 144/86   Pulse Readings from Last 3 Encounters:  11/11/20 83  03/25/20 78  11/13/19 76   Wt Readings from Last 3 Encounters:  11/11/20 193 lb 6.4 oz (87.7 kg)  03/25/20 191 lb (86.6 kg)  11/13/19 184 lb (83.5 kg)   BMI Readings from Last 3 Encounters:  11/11/20 28.56 kg/m  03/25/20 28.21 kg/m  11/13/19 27.17 kg/m    Assessment/Interventions: Review of patient past medical history, allergies, medications, health status, including review of consultants reports, laboratory and other test data, was performed as part of comprehensive evaluation and provision of chronic care management services.   SDOH:  (Social Determinants of Health) assessments and interventions performed: Yes  SDOH Screenings   Alcohol Screen: Not on file  Depression (PHQ2-9): Low Risk    PHQ-2 Score: 0  Financial Resource Strain: Low Risk    Difficulty of Paying Living Expenses: Not hard at all  Food Insecurity: Not on file  Housing: Not on file  Physical Activity: Not on file  Social Connections: Not on file  Stress: Not on file  Tobacco Use: High Risk   Smoking Tobacco Use: Former   Smokeless Tobacco Use: Current  Transportation Needs: Not on file    [place]  Allergies  Allergen Reactions   Sildenafil Other (See Comments)    Caused a headache    Medications Reviewed Today     Reviewed by Charlton Haws, [facility] (Pharmacist) on 02/16/21 at [place] List Status: <None>   Medication Order Taking? Sig Documenting Provider Last Dose Status Informant  amLODipine (NORVASC) 10 MG tablet [id number] Yes Take 1 tablet (10 mg total) by mouth daily. Hoyt Koch, MD Taking Active   aspirin 81 MG EC tablet [id number] Yes Take 81 mg by mouth daily.  [provider] Taking Active   losartan-hydrochlorothiazide (HYZAAR) 100-25 MG tablet [id number] Yes Take 1 tablet by mouth daily. Hoyt Koch, MD Taking Active   metFORMIN (GLUCOPHAGE) 1000 MG tablet [id number] Yes Take 1 tablet (1,000 mg total) by mouth daily with breakfast. Hoyt Koch, MD Taking Active   Multiple Vitamins-Minerals (CENTRUM SILVER PO) [id number] Yes Take 1 tablet by mouth 2 (two) times a week. [provider] Taking Active Multiple Informants  naproxen sodium (ALEVE) 220 MG tablet [id number] Yes Take 220-440 mg by mouth 2 (two) times daily as needed (for pain or headaches). [provider] Taking Active Self  potassium chloride SA (KLOR-CON) 20 MEQ tablet [id number]  Yes Take 1 tablet (20 mEq total) by mouth daily. Hoyt Koch, MD Taking Active   primidone (MYSOLINE) 250 MG tablet [id number] No Take 250 mg by mouth See admin instructions. 2 tablets in AM and 2 tablets at bedtime  Patient not taking: Reported on 02/16/2021   [provider] Not Taking Active Multiple Informants           Med Note Charlton Haws   Mon Mar 17, 2020 10:00 AM)    propranolol ER (INDERAL LA) 120 MG 24 hr capsule [id number] Yes TAKE 1 CAPSULE(120 MG) BY MOUTH DAILY Hoyt Koch, MD Taking Active   simvastatin (ZOCOR) 20 MG tablet [id number] Yes TAKE 1 TABLET(20 MG) BY MOUTH AT BEDTIME Hoyt Koch, MD Taking Active   Triamcinolone Acetonide (TRIAMCINOLONE 0.1 % CREAM : EUCERIN) CREA [id number] Yes Apply 1 application topically 2 (two) times daily as needed. Disp 1 pound Gregor Hams, MD Taking Active             Patient Active Problem List   Diagnosis Date Noted   Shoulder pain 07/06/2019   Tremor, essential 02/27/2014   Routine health maintenance 01/11/2013   History of colonic polyps 04/10/2010   CATARACT, SENILE, BILATERAL 06/24/2009   Hyperlipidemia associated with type 2 diabetes mellitus ([place]) 11/17/2007    Diabetes mellitus type 2 with complications ([place]) 62/83/1517   IMPOTENCE 11/16/2007   Essential hypertension, malignant 11/16/2007   HEMORRHOIDS 11/16/2007    Immunization History  Administered Date(s) Administered   Fluad Quad(high Dose 65+) 03/24/2019   Influenza Whole 04/08/2009   Influenza, High Dose Seasonal PF 03/23/2016, 03/22/2017   Influenza-Unspecified 03/17/2018   PFIZER(Purple Top)SARS-COV-2 Vaccination 08/04/2019, 08/25/2019   Pneumococcal Polysaccharide-23 11/11/2020   Td 08/30/2007   Tdap 07/12/2018    Conditions to be addressed/monitored:  Hypertension, Hyperlipidemia, and Diabetes, Hx Stroke  There are no care plans that you recently modified to display for this patient.    Medication Assistance: None required.  Patient affirms current coverage meets needs.  Compliance/Adherence/Medication fill history: Care Gaps: A1c (due 05/15/20) Eye exam (due 07/25/20) Vaccines - covid booster, Shingrix  Star-Rating Drugs: Simvastatin - LF 10/13/20 x 90 ds Losartan-hctz - LF 11/11/20 x 90 ds Metformin - LF 11/11/20 x 90 ds  Patient's preferred pharmacy is:  [id number] [address], [address] AT [facility] OF [address] [place] [address] Phone: [phone] Fax: [place], [place] [place] [place] [address] Phone: [phone] Fax: [phone]  Uses pill box? Yes Pt endorses 100% compliance  We discussed: Current pharmacy is preferred with insurance plan and patient is satisfied with pharmacy services Patient decided to: Continue current medication management strategy  Care Plan and Follow Up Patient Decision:  Patient agrees to Care Plan and Follow-up.  Plan: Telephone follow up appointment with care management team member scheduled for:  1 month  Charlene Brooke, PharmD, [place], CPP Clinical Pharmacist [facility] [phone]

## 2021-03-16 ENCOUNTER — Telehealth: Payer: Self-pay | Admitting: Pharmacist

## 2021-03-16 NOTE — Progress Notes (Signed)
    Chronic Care Management Pharmacy Assistant   Name: HENCE DERRICK MRN: [id number] DOB: 06-27-1946  Called patient to see if he picked up Metoprolol from his pharmacy [facility]. Patient stated he picked up and started taking medication on Saturday night. He stated that he has not checked his BP since starting the medication. Patient states he will check and record his BP this week and call in his readings.   Orinda Kenner, [place] Clinical Pharmacists Assistant [phone]

## 2021-03-27 DIAGNOSIS — E1169 Type 2 diabetes mellitus with other specified complication: Secondary | ICD-10-CM

## 2021-03-27 DIAGNOSIS — E785 Hyperlipidemia, unspecified: Secondary | ICD-10-CM

## 2021-03-27 DIAGNOSIS — E119 Type 2 diabetes mellitus without complications: Secondary | ICD-10-CM

## 2021-03-27 DIAGNOSIS — I10 Essential (primary) hypertension: Secondary | ICD-10-CM

## 2021-04-02 DIAGNOSIS — Z79899 Other long term (current) drug therapy: Secondary | ICD-10-CM | POA: Diagnosis not present

## 2021-04-02 DIAGNOSIS — I10 Essential (primary) hypertension: Secondary | ICD-10-CM | POA: Diagnosis not present

## 2021-04-02 DIAGNOSIS — G250 Essential tremor: Secondary | ICD-10-CM | POA: Diagnosis not present

## 2021-04-02 DIAGNOSIS — Z8673 Personal history of transient ischemic attack (TIA), and cerebral infarction without residual deficits: Secondary | ICD-10-CM | POA: Diagnosis not present

## 2021-04-06 ENCOUNTER — Ambulatory Visit (INDEPENDENT_AMBULATORY_CARE_PROVIDER_SITE_OTHER): Payer: Medicare Other | Admitting: Pharmacist

## 2021-04-06 ENCOUNTER — Other Ambulatory Visit: Payer: Self-pay

## 2021-04-06 DIAGNOSIS — E785 Hyperlipidemia, unspecified: Secondary | ICD-10-CM

## 2021-04-06 DIAGNOSIS — I10 Essential (primary) hypertension: Secondary | ICD-10-CM

## 2021-04-06 DIAGNOSIS — E1169 Type 2 diabetes mellitus with other specified complication: Secondary | ICD-10-CM

## 2021-04-06 DIAGNOSIS — G250 Essential tremor: Secondary | ICD-10-CM

## 2021-04-06 DIAGNOSIS — E119 Type 2 diabetes mellitus without complications: Secondary | ICD-10-CM

## 2021-04-06 DIAGNOSIS — Z8673 Personal history of transient ischemic attack (TIA), and cerebral infarction without residual deficits: Secondary | ICD-10-CM

## 2021-04-06 NOTE — Patient Instructions (Signed)
Visit Information  Phone number for Pharmacist: [phone]   Goals Addressed             This Visit's Progress    Track and Manage My Blood Pressure-Hypertension       Timeframe:  Long-Range Goal Priority:  High Start Date:       02/16/21                      Expected End Date:  06/27/21                     Follow Up Date Nov 2022   - check blood pressure 3 x a week - choose a place to take my blood pressure (home, clinic or office, retail store) - write blood pressure results in a log or diary  -Move all BP medications to morning   Why is this important?   You won't feel high blood pressure, but it can still hurt your blood vessels.  High blood pressure can cause heart or kidney problems. It can also cause a stroke.  Making lifestyle changes like losing a little weight or eating less salt will help.  Checking your blood pressure at home and at different times of the day can help to control blood pressure.  If the doctor prescribes medicine remember to take it the way the doctor ordered.  Call the office if you cannot afford the medicine or if there are questions about it.     Notes:         Care Plan : [place]  Updates made by Charlton Haws, RPH since 04/06/2021 12:00 AM     Problem: Hypertension, Hyperlipidemia, and Diabetes, Hx Stroke, essential tremor   Priority: High     Long-Range Goal: Disease management   Start Date: 02/16/2021  Expected End Date: 02/16/2022  This Visit's Progress: Not on track  Recent Progress: On track  Priority: High  Note:   Current Barriers:  Unable to independently monitor therapeutic efficacy Unable to maintain control of HTN  Pharmacist Clinical Goal(s):  Patient will achieve adherence to monitoring guidelines and medication adherence to achieve therapeutic efficacy maintain control of HTN as evidenced by home BP  through collaboration with PharmD and provider.   Interventions: 1:1 collaboration with  Hoyt Koch, MD regarding development and update of comprehensive plan of care as evidenced by provider attestation and co-signature Inter-disciplinary care team collaboration (see longitudinal plan of care) Comprehensive medication review performed; medication list updated in electronic medical record  Hypertension    BP goal is:  <140/90  Patient checks BP at home: 3x a week Patient home BP readings are ranging:  10/6: 175/75,  9/19: 159/94,  9/20: 155/93,  9/21: 173/96   Patient has failed these meds in the past: n/a Patient is currently controlled on the following medications:  Amlodipine 10 mg daily PM Losartan-HCTZ 100-25 mg daily PM Metoprolol succinate 25 mg daily   We discussed: BP is elevated at home; discussed BP goal; pt endorses compliance with medication, including new metoprolol; he has not checked BP in past few weeks except for 10/6 after neurology appt; pt report he sometimes misses doses of BP meds at night because he falls asleep; his battery is dead in his BP monitor currently so he could not check on the call today   Plan: Move all medications to AM; check BP daily; f/u BP 1-2 weeks   Hyperlipidemia  LDL goal < 70 Hx stroke Sept 2020 - ischemic and hemorrhagic components LDL steadily increasing 90 > 132 since 2019   Patient has failed these meds in past: n/a Patient is currently uncontrolled on the following medications:  Simvastatin 20 mg HS Aspirin 81 mg daily HS   We discussed:  LDL is above goal but lipid panel is > 75 year old; pt did not stay for lab work during recent PCP visit May 2022; he has appt upcoming in Nov 2022   Plan:  -Repeat lipid panel @ Nov 2022 PCP visit -Consider change to high-intensity statin   Diabetes    A1c goal <7%   Patient has failed these meds in past: n/a Patient is currently controlled on the following medications: Metformin 1000 mg once daily   We discussed: A1c is at goal; pt endorses compliance with  metformin   Plan: Continue current medications and control with diet and exercise  Tremor  -Not ideally controlled - pt recently started topiramate for tremor per neurology, he is not sure if it helping yet -Current treatment  Primidone 250 mg - 2 tab AM, 3 tab HS Topiramate 25 mg - 2 tab BID  Metoprolol succinate 25 mg daily -Previously tried/failed medications: Nurtec, escitalopram, propranolol -Recommend to continue current medication  Health Maintenance  -Vaccine gaps: covid booster, Shingrix, flu   Patient Goals/Self-Care Activities Patient will:  - take medications as prescribed -focus on medication adherence by routine -check blood pressure daily; record in BP log -engage in dietary modifications by reduce caffeine, salt -Move medications to AM to prevent missed doses at night      The patient verbalized understanding of instructions, educational materials, and care plan provided today and declined offer to receive copy of patient instructions, educational materials, and care plan.  Telephone follow up appointment with pharmacy team member scheduled for: 2 weeks  Charlene Brooke, PharmD, [place], CPP Clinical Pharmacist [facility] [phone]

## 2021-04-06 NOTE — Progress Notes (Signed)
Chronic Care Management Pharmacy Note  04/06/2021 Name:  Drew Harrington MRN:  [id number] DOB:  02-Sep-1945  Summary: -BP is elevated at home though pt has not been monitoring consistently (175/78 last week); he endorses compliance with medications except he does miss bedtime doses occasionally due to falling asleep early -Pt is due for repeat lipid panel (last 10/2019)  Recommendations/Changes made from today's visit: -Advised pt to move all medications to AM -Advised to check BP daily and keep a log; f/u BP in 1-2 weeks -Advised pt to contact PCP if BP is sustained > 170/100 -Recommend repeat lipid panel @ Nov PCP appt. Consider changing to high intensity statin if LDL above goal.   Subjective: Drew Harrington is an 75 y.o. year old male who is a primary patient of Hoyt Koch, MD.  The CCM team was consulted for assistance with disease management and care coordination needs.    Engaged with patient by telephone for follow up visit in response to provider referral for pharmacy case management and/or care coordination services.   Consent to Services:  The patient was given information about Chronic Care Management services, agreed to services, and gave verbal consent prior to initiation of services.  Please see initial visit note for detailed documentation.   Patient Care Team: Hoyt Koch, MD as PCP - General (Internal Medicine) Charlton Haws, [facility] as Pharmacist (Pharmacist)  Recent office visits: 11/11/20 Dr [name] OV: CPE. Pt off hydralazine. No med changes. Pt did not stay for labs.  Recent consult visits: 04/02/21 Dr Everette Rank [facility] neurology): f/u essential tremor, CVA. BP 188/105. Trial topiramate for tremor. May consider trileptal in future. Primidone should be 2 tabs AM, 3 tabs HS (pt is taking 4 tabs AM, 5 tabs HS).   08/21/20 [facility] Everette Rank [facility] f/u essentrial tremor, CVA. Trial escitalopram  Hospital visits: None in previous 6  months   Objective:  Lab Results  Component Value Date   CREATININE 1.05 11/13/2019   BUN 13 11/13/2019   GFR 83.39 11/13/2019   GFRNONAA >60 03/24/2019   GFRAA >60 03/24/2019   NA 139 11/13/2019   K 3.5 11/13/2019   CALCIUM 9.8 11/13/2019   CO2 32 11/13/2019   GLUCOSE 113 (H) 11/13/2019    Lab Results  Component Value Date/Time   HGBA1C 5.9 11/13/2019 02:18 PM   HGBA1C 5.8 (A) 07/06/2019 08:27 AM   HGBA1C 6.3 (H) 03/23/2019 03:21 AM   GFR 83.39 11/13/2019 02:18 PM   GFR 62.49 04/26/2019 03:48 PM   MICROALBUR 8.2 (H) 07/12/2018 09:38 AM   MICROALBUR 7.2 (H) 05/06/2016 09:51 AM    Last diabetic Eye exam:  Lab Results  Component Value Date/Time   HMDIABEYEEXA No Retinopathy 07/26/2019 12:00 AM    Last diabetic Foot exam: No results found for: HMDIABFOOTEX   Lab Results  Component Value Date   CHOL 212 (H) 11/13/2019   HDL 56.10 11/13/2019   LDLCALC 132 (H) 11/13/2019   LDLDIRECT 173.8 01/10/2013   TRIG 120.0 11/13/2019   CHOLHDL 4 11/13/2019    Hepatic Function Latest Ref Rng & Units 11/13/2019 04/26/2019 03/22/2019  Total Protein 6.0 - 8.3 g/dL 7.5 7.4 7.2  Albumin 3.5 - 5.2 g/dL 4.2 4.3 3.6  AST 0 - 37 U/L _0 ALT 0 - 53 U/L _1 Alk Phosphatase 39 - 117 U/L 182(H) 198(H) 204(H)  Total Bilirubin 0.2 - 1.2 mg/dL 0.3 0.4 0.4  Bilirubin, Direct 0.0 - 0.3 mg/dL - - -  Lab Results  Component Value Date/Time   TSH 1.76 11/28/2014 11:15 AM   TSH 2.09 06/23/2009 12:32 PM    CBC Latest Ref Rng & Units 11/13/2019 04/26/2019 03/23/2019  WBC 4.0 - 10.5 K/uL 8.3 10.5 6.8  Hemoglobin 13.0 - 17.0 g/dL 16.0 15.7 16.1  Hematocrit 39.0 - 52.0 % 47.4 46.3 47.5  Platelets 150.0 - 400.0 K/uL 254.0 251.0 260    No results found for: VD25OH  Clinical ASCVD: Yes  The ASCVD Risk score (Arnett DK, et al., 2019) failed to calculate for the following reasons:   The patient has a prior MI or stroke diagnosis    Depression screen [facility] 2/9 11/11/2020 07/12/2018  01/13/2017  Decreased Interest 0 0 0  Down, Depressed, Hopeless 0 0 0  PHQ - 2 Score 0 0 0      Social History   Tobacco Use  Smoking Status Former  Smokeless Tobacco Current   Types: Chew  Tobacco Comments   last used 3 weeks ago   BP Readings from Last 3 Encounters:  11/11/20 140/80  03/25/20 (!) 160/88  11/13/19 (!) 144/86   Pulse Readings from Last 3 Encounters:  11/11/20 83  03/25/20 78  11/13/19 76   Wt Readings from Last 3 Encounters:  11/11/20 193 lb 6.4 oz (87.7 kg)  03/25/20 191 lb (86.6 kg)  11/13/19 184 lb (83.5 kg)   BMI Readings from Last 3 Encounters:  11/11/20 28.56 kg/m  03/25/20 28.21 kg/m  11/13/19 27.17 kg/m    Assessment/Interventions: Review of patient past medical history, allergies, medications, health status, including review of consultants reports, laboratory and other test data, was performed as part of comprehensive evaluation and provision of chronic care management services.   SDOH:  (Social Determinants of Health) assessments and interventions performed: Yes  SDOH Screenings   Alcohol Screen: Not on file  Depression (PHQ2-9): Low Risk    PHQ-2 Score: 0  Financial Resource Strain: Not on file  Food Insecurity: Not on file  Housing: Not on file  Physical Activity: Not on file  Social Connections: Not on file  Stress: Not on file  Tobacco Use: High Risk   Smoking Tobacco Use: Former   Smokeless Tobacco Use: Current  Transportation Needs: Not on file    [place]  Allergies  Allergen Reactions   Sildenafil Other (See Comments)    Caused a headache    Medications Reviewed Today     Reviewed by Charlton Haws, [facility] (Pharmacist) on 04/06/21 at 1531  Med List Status: <None>   Medication Order Taking? Sig Documenting Provider Last Dose Status Informant  amLODipine (NORVASC) 10 MG tablet [id number] Yes Take 1 tablet (10 mg total) by mouth daily. Hoyt Koch, MD Taking Active   aspirin 81 MG EC tablet  [id number] Yes Take 81 mg by mouth daily. [provider] Taking Active   losartan-hydrochlorothiazide (HYZAAR) 100-25 MG tablet [id number] Yes Take 1 tablet by mouth daily. Hoyt Koch, MD Taking Active   metFORMIN (GLUCOPHAGE) 1000 MG tablet [id number] Yes Take 1 tablet (1,000 mg total) by mouth daily with breakfast. Hoyt Koch, MD Taking Active   metoprolol succinate (TOPROL-XL) 25 MG 24 hr tablet [id number] Yes Take 1 tablet (25 mg total) by mouth daily. Hoyt Koch, MD Taking Active   Multiple Vitamins-Minerals (CENTRUM SILVER PO) [id number] Yes Take 1 tablet by mouth 2 (two) times a week. [provider] Taking Active Multiple Informants  naproxen sodium (ALEVE) 220 MG tablet [id number]  Yes Take 220-440 mg by mouth 2 (two) times daily as needed (for pain or headaches). [provider] Taking Active Self  potassium chloride SA (KLOR-CON) 20 MEQ tablet [id number] Yes Take 1 tablet (20 mEq total) by mouth daily. Hoyt Koch, MD Taking Active   primidone (MYSOLINE) 250 MG tablet [id number] Yes Take 250 mg by mouth See admin instructions. 2 tablets in AM and 3 tablets at bedtime [provider] Taking Active            Med Note Charlton Haws   Mon Mar 17, 2020 10:00 AM)    simvastatin (ZOCOR) 20 MG tablet [id number] Yes TAKE 1 TABLET(20 MG) BY MOUTH AT BEDTIME Hoyt Koch, MD Taking Active   Triamcinolone Acetonide (TRIAMCINOLONE 0.1 % CREAM : EUCERIN) CREA [id number] Yes Apply 1 application topically 2 (two) times daily as needed. Disp 1 pound Gregor Hams, MD Taking Active             Patient Active Problem List   Diagnosis Date Noted   Shoulder pain 07/06/2019   Tremor, essential 02/27/2014   Routine health maintenance 01/11/2013   History of colonic polyps 04/10/2010   CATARACT, SENILE, BILATERAL 06/24/2009   Hyperlipidemia associated with type 2 diabetes mellitus ([place]) 11/17/2007   Diabetes mellitus  type 2 with complications ([place]) 46/56/8127   IMPOTENCE 11/16/2007   Essential hypertension, malignant 11/16/2007   HEMORRHOIDS 11/16/2007    Immunization History  Administered Date(s) Administered   Fluad Quad(high Dose 65+) 03/24/2019   Influenza Whole 04/08/2009   Influenza, High Dose Seasonal PF 03/23/2016, 03/22/2017   Influenza-Unspecified 03/17/2018   PFIZER(Purple Top)SARS-COV-2 Vaccination 08/04/2019, 08/25/2019   Pneumococcal Polysaccharide-23 11/11/2020   Td 08/30/2007   Tdap 07/12/2018    Conditions to be addressed/monitored:  Hypertension, Hyperlipidemia, and Diabetes, Hx Stroke  Care Plan : [place]  Updates made by Charlton Haws, [place] since 04/06/2021 12:00 AM     Problem: Hypertension, Hyperlipidemia, and Diabetes, Hx Stroke, essential tremor   Priority: High     Long-Range Goal: Disease management   Start Date: 02/16/2021  Expected End Date: 02/16/2022  This Visit's Progress: Not on track  Recent Progress: On track  Priority: High  Note:   Current Barriers:  Unable to independently monitor therapeutic efficacy Unable to maintain control of HTN  Pharmacist Clinical Goal(s):  Patient will achieve adherence to monitoring guidelines and medication adherence to achieve therapeutic efficacy maintain control of HTN as evidenced by home BP  through collaboration with PharmD and provider.   Interventions: 1:1 collaboration with Hoyt Koch, MD regarding development and update of comprehensive plan of care as evidenced by provider attestation and co-signature Inter-disciplinary care team collaboration (see longitudinal plan of care) Comprehensive medication review performed; medication list updated in electronic medical record  Hypertension    BP goal is:  <140/90  Patient checks BP at home: 3x a week Patient home BP readings are ranging:  10/6: 175/75,  9/19: 159/94,  9/20: 155/93,  9/21: 173/96   Patient has failed these  meds in the past: n/a Patient is currently controlled on the following medications:  Amlodipine 10 mg daily PM Losartan-HCTZ 100-25 mg daily PM Metoprolol succinate 25 mg daily   We discussed: BP is elevated at home; discussed BP goal; pt endorses compliance with medication, including new metoprolol; he has not checked BP in past few weeks except for 10/6 after neurology appt; pt report he sometimes misses doses of BP meds at  night because he falls asleep; his battery is dead in his BP monitor currently so he could not check on the call today   Plan: Move all medications to AM; check BP daily; f/u BP 1-2 weeks   Hyperlipidemia    LDL goal < 70 Hx stroke Sept 2020 - ischemic and hemorrhagic components LDL steadily increasing 90 > 132 since 2019   Patient has failed these meds in past: n/a Patient is currently uncontrolled on the following medications:  Simvastatin 20 mg HS Aspirin 81 mg daily HS   We discussed:  LDL is above goal but lipid panel is > 51 year old; pt did not stay for lab work during recent PCP visit May 2022; he has appt upcoming in Nov 2022   Plan:  -Repeat lipid panel @ Nov 2022 PCP visit -Consider change to high-intensity statin   Diabetes    A1c goal <7%   Patient has failed these meds in past: n/a Patient is currently controlled on the following medications: Metformin 1000 mg once daily   We discussed: A1c is at goal; pt endorses compliance with metformin   Plan: Continue current medications and control with diet and exercise  Tremor  -Not ideally controlled - pt recently started topiramate for tremor per neurology, he is not sure if it helping yet -Current treatment  Primidone 250 mg - 2 tab AM, 3 tab HS Topiramate 25 mg - 2 tab BID  Metoprolol succinate 25 mg daily -Previously tried/failed medications: Nurtec, escitalopram, propranolol -Recommend to continue current medication  Health Maintenance  -Vaccine gaps: covid booster, Shingrix,  flu   Patient Goals/Self-Care Activities Patient will:  - take medications as prescribed -focus on medication adherence by routine -check blood pressure daily; record in BP log -engage in dietary modifications by reduce caffeine, salt -Move medications to AM to prevent missed doses at night       Medication Assistance: None required.  Patient affirms current coverage meets needs.  Compliance/Adherence/Medication fill history: Care Gaps: A1c (due 05/15/20) Eye exam (due 07/25/20)  Star-Rating Drugs: Simvastatin - LF 02/20/21, 10/13/20 x 90 ds Losartan-HCTZ - LF 03/21/21 x 90 ds; 11/11/20 x 90 ds Metformin - LF 01/09/21, 11/11/20 x 90 ds  Patient's preferred pharmacy is:  [id number] [address], [address] AT [facility] [address] [place] [address] Phone: [phone] Fax: [phone]  [place], [address] [place] [address] Phone: [phone] Fax: [phone]  Uses pill box? Yes Pt endorses 100% compliance  We discussed: Current pharmacy is preferred with insurance plan and patient is satisfied with pharmacy services Patient decided to: Continue current medication management strategy  Care Plan and Follow Up Patient Decision:  Patient agrees to Care Plan and Follow-up.  Plan: Telephone follow up appointment with care management team member scheduled for:  1 month  Charlene Brooke, PharmD, [place], CPP Clinical Pharmacist [facility] [phone]

## 2021-04-15 ENCOUNTER — Telehealth: Payer: Self-pay | Admitting: Pharmacist

## 2021-04-15 NOTE — Progress Notes (Signed)
    Chronic Care Management Pharmacy Assistant   Name: Drew Harrington  MRN: [id number] DOB: [birth year]   Spoke with patient regarding Hypertension.   Current antihypertensive regimen:  Metoprolol Amlodipine Losartan  How often are you checking your Blood Pressure?  Every other day   Current home BP readings: Patient only has one reading to report. 04/17/21 - 169/102  Patient states he feels fine he think his bottom number is high because he ate sausage for breakfast. Patient states he does not drink enough water to help flush the salt out but he will start drinking more and cut back on his sodas.   Orinda Kenner, [place] Clinical Pharmacists Assistant [phone]

## 2021-04-27 DIAGNOSIS — E119 Type 2 diabetes mellitus without complications: Secondary | ICD-10-CM

## 2021-04-27 DIAGNOSIS — I10 Essential (primary) hypertension: Secondary | ICD-10-CM | POA: Diagnosis not present

## 2021-04-27 DIAGNOSIS — E785 Hyperlipidemia, unspecified: Secondary | ICD-10-CM | POA: Diagnosis not present

## 2021-04-27 DIAGNOSIS — E1169 Type 2 diabetes mellitus with other specified complication: Secondary | ICD-10-CM | POA: Diagnosis not present

## 2021-05-05 ENCOUNTER — Telehealth: Payer: Self-pay | Admitting: Pharmacist

## 2021-05-05 ENCOUNTER — Telehealth: Payer: Medicare Other

## 2021-05-05 NOTE — Progress Notes (Deleted)
Chronic Care Management Pharmacy Note  05/05/2021 Name:  Drew Harrington MRN:  [id number] DOB:  01/07/1946  Summary: -BP is elevated at home though pt has not been monitoring consistently (175/78 last week); he endorses compliance with medications except he does miss bedtime doses occasionally due to falling asleep early -Pt is due for repeat lipid panel (last 10/2019)  Recommendations/Changes made from today's visit: -Advised pt to move all medications to AM -Advised to check BP daily and keep a log; f/u BP in 1-2 weeks -Advised pt to contact PCP if BP is sustained > 170/100 -Recommend repeat lipid panel @ Nov PCP appt. Consider changing to high intensity statin if LDL above goal.   Subjective: Drew Harrington is an 75 y.o. year old male who is a primary patient of Hoyt Koch, MD.  The CCM team was consulted for assistance with disease management and care coordination needs.    Engaged with patient by telephone for follow up visit in response to provider referral for pharmacy case management and/or care coordination services.   Consent to Services:  The patient was given information about Chronic Care Management services, agreed to services, and gave verbal consent prior to initiation of services.  Please see initial visit note for detailed documentation.   Patient Care Team: Hoyt Koch, MD as PCP - General (Internal Medicine) Charlton Haws, [facility] as Pharmacist (Pharmacist)  Recent office visits: 11/11/20 Dr [name] OV: CPE. Pt off hydralazine. No med changes. Pt did not stay for labs.  Recent consult visits: 04/02/21 Dr Everette Rank [facility] neurology): f/u essential tremor, CVA. BP 188/105. Trial topiramate for tremor. May consider trileptal in future. Primidone should be 2 tabs AM, 3 tabs HS (pt is taking 4 tabs AM, 5 tabs HS).   08/21/20 Dr Everette Rank [facility] f/u essentrial tremor, CVA. Trial escitalopram  Hospital visits: None in previous 6  months   Objective:  Lab Results  Component Value Date   CREATININE 1.05 11/13/2019   BUN 13 11/13/2019   GFR 83.39 11/13/2019   GFRNONAA >60 03/24/2019   GFRAA >60 03/24/2019   NA 139 11/13/2019   K 3.5 11/13/2019   CALCIUM 9.8 11/13/2019   CO2 32 11/13/2019   GLUCOSE 113 (H) 11/13/2019    Lab Results  Component Value Date/Time   HGBA1C 5.9 11/13/2019 02:18 PM   HGBA1C 5.8 (A) 07/06/2019 08:27 AM   HGBA1C 6.3 (H) 03/23/2019 03:21 AM   GFR 83.39 11/13/2019 02:18 PM   GFR 62.49 04/26/2019 03:48 PM   MICROALBUR 8.2 (H) 07/12/2018 09:38 AM   MICROALBUR 7.2 (H) 05/06/2016 09:51 AM    Last diabetic Eye exam:  Lab Results  Component Value Date/Time   HMDIABEYEEXA No Retinopathy 07/26/2019 12:00 AM    Last diabetic Foot exam: No results found for: HMDIABFOOTEX   Lab Results  Component Value Date   CHOL 212 (H) 11/13/2019   HDL 56.10 11/13/2019   LDLCALC 132 (H) 11/13/2019   LDLDIRECT 173.8 01/10/2013   TRIG 120.0 11/13/2019   CHOLHDL 4 11/13/2019    Hepatic Function Latest Ref Rng & Units 11/13/2019 04/26/2019 03/22/2019  Total Protein 6.0 - 8.3 g/dL 7.5 7.4 7.2  Albumin 3.5 - 5.2 g/dL 4.2 4.3 3.6  AST 0 - 37 U/L _0 ALT 0 - 53 U/L _1 Alk Phosphatase 39 - 117 U/L 182(H) 198(H) 204(H)  Total Bilirubin 0.2 - 1.2 mg/dL 0.3 0.4 0.4  Bilirubin, Direct 0.0 - 0.3 mg/dL - - -  Lab Results  Component Value Date/Time   TSH 1.76 11/28/2014 11:15 AM   TSH 2.09 06/23/2009 12:32 PM    CBC Latest Ref Rng & Units 11/13/2019 04/26/2019 03/23/2019  WBC 4.0 - 10.5 K/uL 8.3 10.5 6.8  Hemoglobin 13.0 - 17.0 g/dL 16.0 15.7 16.1  Hematocrit 39.0 - 52.0 % 47.4 46.3 47.5  Platelets 150.0 - 400.0 K/uL 254.0 251.0 260    No results found for: VD25OH  Clinical ASCVD: Yes  The ASCVD Risk score (Arnett DK, et al., 2019) failed to calculate for the following reasons:   The patient has a prior MI or stroke diagnosis    Depression screen [facility] 2/9 11/11/2020 07/12/2018  01/13/2017  Decreased Interest 0 0 0  Down, Depressed, Hopeless 0 0 0  PHQ - 2 Score 0 0 0      Social History   Tobacco Use  Smoking Status Former  Smokeless Tobacco Current   Types: Chew  Tobacco Comments   last used 3 weeks ago   BP Readings from Last 3 Encounters:  11/11/20 140/80  03/25/20 (!) 160/88  11/13/19 (!) 144/86   Pulse Readings from Last 3 Encounters:  11/11/20 83  03/25/20 78  11/13/19 76   Wt Readings from Last 3 Encounters:  11/11/20 193 lb 6.4 oz (87.7 kg)  03/25/20 191 lb (86.6 kg)  11/13/19 184 lb (83.5 kg)   BMI Readings from Last 3 Encounters:  11/11/20 28.56 kg/m  03/25/20 28.21 kg/m  11/13/19 27.17 kg/m    Assessment/Interventions: Review of patient past medical history, allergies, medications, health status, including review of consultants reports, laboratory and other test data, was performed as part of comprehensive evaluation and provision of chronic care management services.   SDOH:  (Social Determinants of Health) assessments and interventions performed: Yes  SDOH Screenings   Alcohol Screen: Not on file  Depression (PHQ2-9): Low Risk    PHQ-2 Score: 0  Financial Resource Strain: Not on file  Food Insecurity: Not on file  Housing: Not on file  Physical Activity: Not on file  Social Connections: Not on file  Stress: Not on file  Tobacco Use: High Risk   Smoking Tobacco Use: Former   Smokeless Tobacco Use: Current   Passive Exposure: Not on file  Transportation Needs: Not on file    [place]  Allergies  Allergen Reactions   Sildenafil Other (See Comments)    Caused a headache    Medications Reviewed Today     Reviewed by Charlton Haws, [facility] (Pharmacist) on 04/06/21 at 1531  Med List Status: <None>   Medication Order Taking? Sig Documenting Provider Last Dose Status Informant  amLODipine (NORVASC) 10 MG tablet [id number] Yes Take 1 tablet (10 mg total) by mouth daily. Hoyt Koch, MD Taking  Active   aspirin 81 MG EC tablet [id number] Yes Take 81 mg by mouth daily. [provider] Taking Active   losartan-hydrochlorothiazide (HYZAAR) 100-25 MG tablet [id number] Yes Take 1 tablet by mouth daily. Hoyt Koch, MD Taking Active   metFORMIN (GLUCOPHAGE) 1000 MG tablet [id number] Yes Take 1 tablet (1,000 mg total) by mouth daily with breakfast. Hoyt Koch, MD Taking Active   metoprolol succinate (TOPROL-XL) 25 MG 24 hr tablet [id number] Yes Take 1 tablet (25 mg total) by mouth daily. Hoyt Koch, MD Taking Active   Multiple Vitamins-Minerals (CENTRUM SILVER PO) [id number] Yes Take 1 tablet by mouth 2 (two) times a week. [provider] Taking Active Multiple Informants  naproxen sodium (ALEVE) 220 MG tablet [id number] Yes Take 220-440 mg by mouth 2 (two) times daily as needed (for pain or headaches). [provider] Taking Active Self  potassium chloride SA (KLOR-CON) 20 MEQ tablet [id number] Yes Take 1 tablet (20 mEq total) by mouth daily. Hoyt Koch, MD Taking Active   primidone (MYSOLINE) 250 MG tablet [id number] Yes Take 250 mg by mouth See admin instructions. 2 tablets in AM and 3 tablets at bedtime [provider] Taking Active            Med Note Charlton Haws   Mon Mar 17, 2020 10:00 AM)    simvastatin (ZOCOR) 20 MG tablet [id number] Yes TAKE 1 TABLET(20 MG) BY MOUTH AT BEDTIME Hoyt Koch, MD Taking Active   Triamcinolone Acetonide (TRIAMCINOLONE 0.1 % CREAM : EUCERIN) CREA [id number] Yes Apply 1 application topically 2 (two) times daily as needed. Disp 1 pound Gregor Hams, MD Taking Active             Patient Active Problem List   Diagnosis Date Noted   Shoulder pain 07/06/2019   Tremor, essential 02/27/2014   Routine health maintenance 01/11/2013   History of colonic polyps 04/10/2010   CATARACT, SENILE, BILATERAL 06/24/2009   Hyperlipidemia associated with type 2 diabetes mellitus  ([place]) 11/17/2007   Diabetes mellitus type 2 with complications ([place]) 15/10/6977   IMPOTENCE 11/16/2007   Essential hypertension, malignant 11/16/2007   HEMORRHOIDS 11/16/2007    Immunization History  Administered Date(s) Administered   Fluad Quad(high Dose 65+) 03/24/2019   Influenza Whole 04/08/2009   Influenza, High Dose Seasonal PF 03/23/2016, 03/22/2017   Influenza-Unspecified 03/17/2018   PFIZER(Purple Top)SARS-COV-2 Vaccination 08/04/2019, 08/25/2019   Pneumococcal Polysaccharide-23 11/11/2020   Td 08/30/2007   Tdap 07/12/2018    Conditions to be addressed/monitored:  Hypertension, Hyperlipidemia, and Diabetes, Hx Stroke  There are no care plans that you recently modified to display for this patient.     Medication Assistance: None required.  Patient affirms current coverage meets needs.  Compliance/Adherence/Medication fill history: Care Gaps: A1c (due 05/15/20) Eye exam (due 07/25/20)  Star-Rating Drugs: Simvastatin - LF 02/20/21, 10/13/20 x 90 ds Losartan-HCTZ - LF 03/21/21 x 90 ds; 11/11/20 x 90 ds Metformin - LF 01/09/21, 11/11/20 x 90 ds  Patient's preferred pharmacy is:  [id number] [address], [address] AT [facility] [address] [place] [address] Phone: [phone] Fax: [phone]  [place], [address] [place] [address] Phone: [phone] Fax: [phone]  Uses pill box? Yes Pt endorses 100% compliance  We discussed: Current pharmacy is preferred with insurance plan and patient is satisfied with pharmacy services Patient decided to: Continue current medication management strategy  Care Plan and Follow Up Patient Decision:  Patient agrees to Care Plan and Follow-up.  Plan: Telephone follow up appointment with care management team member scheduled for:  1 month  Charlene Brooke, PharmD, [place], CPP Clinical  Pharmacist [facility] [phone]    Current Barriers:  Unable to independently monitor therapeutic efficacy Unable to maintain control of HTN  Pharmacist Clinical Goal(s):  Patient will achieve adherence to monitoring guidelines and medication adherence to achieve therapeutic efficacy maintain control of HTN as evidenced by home BP  through collaboration with PharmD and provider.   Interventions: 1:1 collaboration with Hoyt Koch, MD regarding development and update of comprehensive plan  of care as evidenced by provider attestation and co-signature Inter-disciplinary care team collaboration (see longitudinal plan of care) Comprehensive medication review performed; medication list updated in electronic medical record  Hypertension    BP goal is:  <140/90  Patient checks BP at home: 3x a week Patient home BP readings are ranging:  10/6: 175/75,  9/19: 159/94,  9/20: 155/93,  9/21: 173/96   Patient has failed these meds in the past: n/a Patient is currently controlled on the following medications:  Amlodipine 10 mg daily PM Losartan-HCTZ 100-25 mg daily PM Metoprolol succinate 25 mg daily   We discussed: BP is elevated at home; discussed BP goal; pt endorses compliance with medication, including new metoprolol; he has not checked BP in past few weeks except for 10/6 after neurology appt; pt report he sometimes misses doses of BP meds at night because he falls asleep; his battery is dead in his BP monitor currently so he could not check on the call today   Plan: Move all medications to AM; check BP daily; f/u BP 1-2 weeks   Hyperlipidemia    LDL goal < 70 Hx stroke Sept 2020 - ischemic and hemorrhagic components LDL steadily increasing 90 > 132 since 2019   Patient has failed these meds in past: n/a Patient is currently uncontrolled on the following medications:  Simvastatin 20 mg HS Aspirin 81 mg daily HS   We discussed:  LDL is above goal but  lipid panel is > 73 year old; pt did not stay for lab work during recent PCP visit May 2022; he has appt upcoming in Nov 2022   Plan:  -Repeat lipid panel @ Nov 2022 PCP visit -Consider change to high-intensity statin   Diabetes    A1c goal <7%   Patient has failed these meds in past: n/a Patient is currently controlled on the following medications: Metformin 1000 mg once daily   We discussed: A1c is at goal; pt endorses compliance with metformin   Plan: Continue current medications and control with diet and exercise  Tremor   -Not ideally controlled - pt recently started topiramate for tremor per neurology, he is not sure if it helping yet -Current treatment  Primidone 250 mg - 2 tab AM, 2 tab HS Topiramate 25 mg - 2 tab BID - d/c'd 10/26 per phone Metoprolol succinate 25 mg daily -Previously tried/failed medications: Nurtec, escitalopram, propranolol -Recommend to continue current medication  Health Maintenance   -Vaccine gaps: covid booster, Shingrix, flu   Patient Goals/Self-Care Activities Patient will:  - take medications as prescribed -focus on medication adherence by routine -check blood pressure daily; record in BP log -engage in dietary modifications by reduce caffeine, salt -Move medications to AM to prevent missed doses at night

## 2021-05-05 NOTE — Telephone Encounter (Signed)
  Chronic Care Management   Outreach Note  05/05/2021 Name: Drew Harrington MRN: [id number] DOB: [birth year]  Referred by: Hoyt Koch, MD  Patient had a phone appointment scheduled with clinical pharmacist today.  An unsuccessful telephone outreach was attempted today. The patient was referred to the pharmacist for assistance with medications, care management and care coordination.   Patient will NOT be penalized in any way for missing a CCM appointment. The no-show fee does not apply.  If possible, a message was left to return call to: [phone] or to [name].   Charlene Brooke, PharmD, [place], CPP Clinical Pharmacist Practitioner [facility] [phone]

## 2021-05-14 ENCOUNTER — Encounter: Payer: Self-pay | Admitting: Internal Medicine

## 2021-05-14 ENCOUNTER — Other Ambulatory Visit: Payer: Self-pay

## 2021-05-14 ENCOUNTER — Ambulatory Visit (INDEPENDENT_AMBULATORY_CARE_PROVIDER_SITE_OTHER): Payer: Medicare Other | Admitting: Internal Medicine

## 2021-05-14 VITALS — BP 122/70 | HR 88 | Resp 18 | Ht 69.0 in | Wt 194.4 lb

## 2021-05-14 DIAGNOSIS — I10 Essential (primary) hypertension: Secondary | ICD-10-CM | POA: Diagnosis not present

## 2021-05-14 DIAGNOSIS — E785 Hyperlipidemia, unspecified: Secondary | ICD-10-CM

## 2021-05-14 DIAGNOSIS — E118 Type 2 diabetes mellitus with unspecified complications: Secondary | ICD-10-CM | POA: Diagnosis not present

## 2021-05-14 DIAGNOSIS — Z23 Encounter for immunization: Secondary | ICD-10-CM

## 2021-05-14 DIAGNOSIS — E1169 Type 2 diabetes mellitus with other specified complication: Secondary | ICD-10-CM | POA: Diagnosis not present

## 2021-05-14 LAB — COMPREHENSIVE METABOLIC PANEL WITH GFR
ALT: 19 U/L (ref 0–53)
AST: 15 U/L (ref 0–37)
Albumin: 4.3 g/dL (ref 3.5–5.2)
Alkaline Phosphatase: 155 U/L — ABNORMAL HIGH (ref 39–117)
BUN: 21 mg/dL (ref 6–23)
CO2: 36 meq/L — ABNORMAL HIGH (ref 19–32)
Calcium: 10 mg/dL (ref 8.4–10.5)
Chloride: 101 meq/L (ref 96–112)
Creatinine, Ser: 1.42 mg/dL (ref 0.40–1.50)
GFR: 48.11 mL/min — ABNORMAL LOW
Glucose, Bld: 149 mg/dL — ABNORMAL HIGH (ref 70–99)
Potassium: 3.6 meq/L (ref 3.5–5.1)
Sodium: 142 meq/L (ref 135–145)
Total Bilirubin: 0.3 mg/dL (ref 0.2–1.2)
Total Protein: 7.6 g/dL (ref 6.0–8.3)

## 2021-05-14 LAB — CBC
HCT: 46.6 % (ref 39.0–52.0)
Hemoglobin: 15.6 g/dL (ref 13.0–17.0)
MCHC: 33.4 g/dL (ref 30.0–36.0)
MCV: 98.9 fl (ref 78.0–100.0)
Platelets: 251 10*3/uL (ref 150.0–400.0)
RBC: 4.71 Mil/uL (ref 4.22–5.81)
RDW: 15.7 % — ABNORMAL HIGH (ref 11.5–15.5)
WBC: 7.8 10*3/uL (ref 4.0–10.5)

## 2021-05-14 LAB — LIPID PANEL
Cholesterol: 194 mg/dL (ref 0–200)
HDL: 61.9 mg/dL
LDL Cholesterol: 109 mg/dL — ABNORMAL HIGH (ref 0–99)
NonHDL: 132.41
Total CHOL/HDL Ratio: 3
Triglycerides: 116 mg/dL (ref 0.0–149.0)
VLDL: 23.2 mg/dL (ref 0.0–40.0)

## 2021-05-14 LAB — VITAMIN B12: Vitamin B-12: 223 pg/mL (ref 211–911)

## 2021-05-14 LAB — HEMOGLOBIN A1C: Hgb A1c MFr Bld: 6.4 % (ref 4.6–6.5)

## 2021-05-14 NOTE — Patient Instructions (Signed)
We will check the labs today. We have given you the flu shot.

## 2021-05-14 NOTE — Progress Notes (Signed)
   Subjective:   Patient ID: Drew Harrington, male    DOB: [birth year], 75 y.o.   MRN: [id number]  HPI The patient is a 75 YO man coming in for follow up. Did not complete labs after last visit as requested.   Review of Systems  Constitutional: Negative.   HENT: Negative.    Eyes: Negative.   Respiratory:  Negative for cough, chest tightness and shortness of breath.   Cardiovascular:  Negative for chest pain, palpitations and leg swelling.  Gastrointestinal:  Negative for abdominal distention, abdominal pain, constipation, diarrhea, nausea and vomiting.  Musculoskeletal: Negative.   Skin: Negative.   Neurological:  Positive for tremors.  Psychiatric/Behavioral: Negative.     Objective:  Physical Exam Constitutional:      Appearance: He is well-developed.  HENT:     Head: Normocephalic and atraumatic.  Cardiovascular:     Rate and Rhythm: Normal rate and regular rhythm.  Pulmonary:     Effort: Pulmonary effort is normal. No respiratory distress.     Breath sounds: Normal breath sounds. No wheezing or rales.  Abdominal:     General: Bowel sounds are normal. There is no distension.     Palpations: Abdomen is soft.     Tenderness: There is no abdominal tenderness. There is no rebound.  Musculoskeletal:     Cervical back: Normal range of motion.  Skin:    General: Skin is warm and dry.  Neurological:     Mental Status: He is alert and oriented to person, place, and time.     Coordination: Coordination normal.    Vitals:   05/14/21 0847  BP: 122/70  Pulse: 88  Resp: 18  SpO2: 99%  Weight: 194 lb 6.4 oz (88.2 kg)  Height: 5\' 9"  (1.753 m)    This visit occurred during the SARS-CoV-2 public health emergency.  Safety protocols were in place, including screening questions prior to the visit, additional usage of staff PPE, and extensive cleaning of exam room while observing appropriate contact time as indicated for disinfecting solutions.   Assessment & Plan:  Flu shot  given at visit

## 2021-05-15 NOTE — Assessment & Plan Note (Signed)
Needs labs to assess control. He did not come for scheduled labs after last visit. Taking metformin 1000 mg daily. Is on ARB and statin. Adjust as needed. Prior stroke.

## 2021-05-15 NOTE — Assessment & Plan Note (Signed)
Checking lipid panel as he did not complete labs after last visit. Adjust simvastatin 20 mg daily as needed. LDL goal <70.

## 2021-05-15 NOTE — Assessment & Plan Note (Signed)
BP at goal today. Checking CMP and adjust as needed his amlodipine and losartan/hctz and metoprolol.

## 2021-06-22 ENCOUNTER — Other Ambulatory Visit: Payer: Self-pay | Admitting: Internal Medicine

## 2021-06-22 DIAGNOSIS — G250 Essential tremor: Secondary | ICD-10-CM

## 2021-06-22 DIAGNOSIS — I10 Essential (primary) hypertension: Secondary | ICD-10-CM

## 2021-07-01 ENCOUNTER — Telehealth: Payer: Self-pay

## 2021-07-01 NOTE — Progress Notes (Signed)
° ° °  Chronic Care Management Pharmacy Assistant   Name: LINNIE MCGLOCKLIN  MRN: [id number] DOB: 1946/03/23  Reason for Encounter: Disease State-General Adherence Appointment: Telephone Nov 02, 2021 @ 10 am  Recent office visits:  05/14/21 [name] (PCP) - Diabetes mellitus type 2 with complications. No med changes.  Recent consult visits:  None  Hospital visits:  None in previous 6 months  Medications: Outpatient Encounter Medications as of 07/01/2021  Medication Sig   amLODipine (NORVASC) 10 MG tablet Take 1 tablet (10 mg total) by mouth daily.   aspirin 81 MG EC tablet Take 81 mg by mouth daily.   losartan-hydrochlorothiazide (HYZAAR) 100-25 MG tablet Take 1 tablet by mouth daily.   metFORMIN (GLUCOPHAGE) 1000 MG tablet Take 1 tablet (1,000 mg total) by mouth daily with breakfast.   metoprolol succinate (TOPROL-XL) 25 MG 24 hr tablet TAKE 1 TABLET(25 MG) BY MOUTH DAILY   Multiple Vitamins-Minerals (CENTRUM SILVER PO) Take 1 tablet by mouth 2 (two) times a week.   naproxen sodium (ALEVE) 220 MG tablet Take 220-440 mg by mouth 2 (two) times daily as needed (for pain or headaches).   potassium chloride SA (KLOR-CON) 20 MEQ tablet Take 1 tablet (20 mEq total) by mouth daily.   primidone (MYSOLINE) 250 MG tablet Take 250 mg by mouth See admin instructions. 2 tablets in AM and 3 tablets at bedtime   simvastatin (ZOCOR) 20 MG tablet TAKE 1 TABLET(20 MG) BY MOUTH AT BEDTIME   Triamcinolone Acetonide (TRIAMCINOLONE 0.1 % CREAM : EUCERIN) CREA Apply 1 application topically 2 (two) times daily as needed. Disp 1 pound   No facility-administered encounter medications on file as of 07/01/2021.   Have you had any problems recently with your health? Patient states he is taking an Aspirin 81 mg a day now.  Have you had any problems with your pharmacy? Patient states no problems with pharmacy.   What issues or side effects are you having with your medications? Patient states no side effects or  issues.   What would you like me to pass along to Lois Huxley, CPP for them to help you with?  Patient is interested in getting the Covid Booster vaccine and would like to know where to get it from.    What can we do to take care of you better? Patient states he is doing good and thanks for calling to check on him.  Care Gaps Colonoscopy - 11/12/19 Diabetic Foot Exam - 11/11/20 Mammogram - NA Ophthalmology - 07/26/19 Dexa Scan - NA Annual Well Visit - NA Micro albumin - NA Hemoglobin A1c - 05/14/21  Star Rating Drugs: Losartan-hydrochlorothiazide - filled 06/22/21 90D Metformin - filled 04/20/21 90D Simvastatin - filled 02/20/21 90D Patient states he is not sure if he is still supposed to take Simvastatin and if so he needs a refill.  Orinda Kenner, [place] Clinical Pharmacists Assistant [phone]

## 2021-07-02 ENCOUNTER — Other Ambulatory Visit: Payer: Self-pay

## 2021-07-02 DIAGNOSIS — E1169 Type 2 diabetes mellitus with other specified complication: Secondary | ICD-10-CM

## 2021-07-02 DIAGNOSIS — E785 Hyperlipidemia, unspecified: Secondary | ICD-10-CM

## 2021-07-02 MED ORDER — SIMVASTATIN 20 MG PO TABS: ORAL_TABLET | ORAL | 3 refills | Status: DC

## 2021-07-13 ENCOUNTER — Telehealth: Payer: Self-pay

## 2021-07-13 NOTE — Telephone Encounter (Signed)
It looks like they called neurology about similar concerns in November, did they ever follow up with them at that time? Has the sleepiness been going on for that long or is it new recently? I think a visit is warranted and if possible his wife should come with him.

## 2021-07-13 NOTE — Telephone Encounter (Signed)
Spoke with the pt's wife and she stated that his primidone causing him to sleep too much and is not helping his "shakes". She then wants to know why is he taking so many blood pressure medications. She stated "that these are too many pills for her to be looking at and he needs to be on one blood pressure medication and that's it". She wants to know does he really need to be on potassium. I advised that a visit may be needed, she was ok with it.

## 2021-07-13 NOTE — Telephone Encounter (Signed)
Pt wife is concern about these medications. amLODipine (NORVASC) 10 MG tablet simvastatin (ZOCOR) 20 MG tablet metoprolol succinate (TOPROL-XL) 25 MG 24 hr tablet losartan-hydrochlorothiazide (HYZAAR) 100-25 MG tablet  They are wanting a call back to discuss if all medications are needed.  Pt is sleeping all the time. Pt is hard to wake up.   Best CB is [phone]

## 2021-07-21 NOTE — Telephone Encounter (Signed)
Spoke with the pt's wife and she stated that he did see his neurologist last week. She mentioned that he does have a MRI scheduled for Feb 4th. She stated that she will follow up once she has those results. Pt is currently doing ok. No other questions or concerns at this time

## 2021-08-01 DIAGNOSIS — R251 Tremor, unspecified: Secondary | ICD-10-CM | POA: Diagnosis not present

## 2021-08-01 DIAGNOSIS — G319 Degenerative disease of nervous system, unspecified: Secondary | ICD-10-CM | POA: Diagnosis not present

## 2021-08-01 DIAGNOSIS — J3489 Other specified disorders of nose and nasal sinuses: Secondary | ICD-10-CM | POA: Diagnosis not present

## 2021-08-01 DIAGNOSIS — I639 Cerebral infarction, unspecified: Secondary | ICD-10-CM | POA: Diagnosis not present

## 2021-08-01 DIAGNOSIS — R413 Other amnesia: Secondary | ICD-10-CM | POA: Diagnosis not present

## 2021-08-10 DIAGNOSIS — Z8673 Personal history of transient ischemic attack (TIA), and cerebral infarction without residual deficits: Secondary | ICD-10-CM | POA: Diagnosis not present

## 2021-08-10 DIAGNOSIS — R413 Other amnesia: Secondary | ICD-10-CM | POA: Diagnosis not present

## 2021-08-10 DIAGNOSIS — G250 Essential tremor: Secondary | ICD-10-CM | POA: Diagnosis not present

## 2021-09-21 ENCOUNTER — Other Ambulatory Visit: Payer: Self-pay | Admitting: Internal Medicine

## 2021-09-21 DIAGNOSIS — G250 Essential tremor: Secondary | ICD-10-CM

## 2021-09-21 DIAGNOSIS — I10 Essential (primary) hypertension: Secondary | ICD-10-CM

## 2021-10-14 ENCOUNTER — Telehealth: Payer: Self-pay

## 2021-10-14 NOTE — Progress Notes (Signed)
? ?  Made a call this morning to the patient to make him aware that his appointment with clinical pharmacist was rescheduled from 11/02/21 to 12/07/21 @2 :20 pm. Unable to leave message. ? ?Ethelene Hal ?Clinical Pharmacist Assistant ?[phone]  ?

## 2021-10-28 DIAGNOSIS — Z79899 Other long term (current) drug therapy: Secondary | ICD-10-CM | POA: Diagnosis not present

## 2021-10-28 DIAGNOSIS — I10 Essential (primary) hypertension: Secondary | ICD-10-CM | POA: Diagnosis not present

## 2021-10-28 DIAGNOSIS — Z8673 Personal history of transient ischemic attack (TIA), and cerebral infarction without residual deficits: Secondary | ICD-10-CM | POA: Diagnosis not present

## 2021-10-28 DIAGNOSIS — R413 Other amnesia: Secondary | ICD-10-CM | POA: Diagnosis not present

## 2021-10-28 DIAGNOSIS — G250 Essential tremor: Secondary | ICD-10-CM | POA: Diagnosis not present

## 2021-10-29 ENCOUNTER — Telehealth: Payer: Self-pay

## 2021-10-29 NOTE — Progress Notes (Signed)
? ? ?Chronic Care Management ?Pharmacy Assistant  ? ?Name: Drew Harrington  MRN: [id number] DOB: 1945/12/22 ? ? ?Reason for Encounter: Disease State-General ?  ? ?Recent office visits:  ?None since the last coordination call on 07/01/21 ? ?Recent consult visits:  ?10/28/21 Karl Luke, MD-Neurologist (Mixed Alzheimer's) Med changes: Donepezil 5 mg at night   ? ?Hospital visits:  ?None in previous 6 months ? ?Medications: ?Outpatient Encounter Medications as of 10/29/2021  ?Medication Sig  ? amLODipine (NORVASC) 10 MG tablet Take 1 tablet (10 mg total) by mouth daily.  ? aspirin 81 MG EC tablet Take 81 mg by mouth daily.  ? losartan-hydrochlorothiazide (HYZAAR) 100-25 MG tablet Take 1 tablet by mouth daily.  ? metFORMIN (GLUCOPHAGE) 1000 MG tablet Take 1 tablet (1,000 mg total) by mouth daily with breakfast.  ? metoprolol succinate (TOPROL-XL) 25 MG 24 hr tablet TAKE 1 TABLET(25 MG) BY MOUTH DAILY  ? Multiple Vitamins-Minerals (CENTRUM SILVER PO) Take 1 tablet by mouth 2 (two) times a week.  ? naproxen sodium (ALEVE) 220 MG tablet Take 220-440 mg by mouth 2 (two) times daily as needed (for pain or headaches).  ? potassium chloride SA (KLOR-CON) 20 MEQ tablet Take 1 tablet (20 mEq total) by mouth daily.  ? primidone (MYSOLINE) 250 MG tablet Take 250 mg by mouth See admin instructions. 2 tablets in AM and 3 tablets at bedtime  ? simvastatin (ZOCOR) 20 MG tablet TAKE 1 TABLET(20 MG) BY MOUTH AT BEDTIME  ? Triamcinolone Acetonide (TRIAMCINOLONE 0.1 % CREAM : EUCERIN) CREA Apply 1 application topically 2 (two) times daily as needed. Disp 1 pound  ? ?No facility-administered encounter medications on file as of 10/29/2021.  ? ?[place] for General Review Call ? ? ?Chart Review: ? ?Have there been any documented new, changed, or discontinued medications since last visit?Yes (If yes, include name, dose, frequency, date)Donepezil 5 mg 1 tab at night ?Has there been any documented recent hospitalizations or ED  visits since last visit with Clinical Pharmacist? No ?Brief Summary (including medication and/or Diagnosis changes): ? ? ?Adherence Review: ? ?Does the Clinical Pharmacist Assistant have access to adherence rates? Yes ?Adherence rates for STAR metric medications (List medication(s)/day supply/ last 2 fill dates). ?Adherence rates for medications indicated for disease state being reviewed (List medication(s)/day supply/ last 2 fill dates). ?Does the patient have >5 day gap between last estimated fill dates for any of the above medications or other medication gaps? No ?Reason for medication gaps. ? ? ?Disease State Questions: ? ?Able to connect with Patient? Yes ?Did patient have any problems with their health recently? No ?Note problems and Concerns: ?Have you had any admissions or emergency room visits or worsening of your condition(s) since last visit? No ?Details of ED visit, hospital visit and/or worsening condition(s): ?Have you had any visits with new specialists or providers since your last visit? Yes, [place] a follow visit ?Have you had any new health care problem(s) since your last visit? No ?New problem(s) reported: ?Have you run out of any of your medications since you last spoke with clinical pharmacist? No ?What caused you to run out of your medications? ?Are there any medications you are not taking as prescribed? Yes,  ?What kept you from taking your medications as prescribed?Patient was taking donepezil with all other meds during the day but suppose to take at night. Patient stated that he will start tonight. ?Are you having any issues or side effects  with your medications? Yes ?Note of issues or side effects: sometimes get headache when first taking medications but then after a while it goes away ?Do you have any other health concerns or questions you want to discuss with your Clinical Pharmacist before your next visit? No ?Note additional concerns and  questions from Patient. ?Are there any health concerns that you feel we can do a better job addressing? No ?Note Patient's response. ?Are you having any problems with any of the following since the last visit: (select all that apply) ? None ? Details: ?12. Any falls since last visit? No ? Details: ?13. Any increased or uncontrolled pain since last visit? No ? Details: ?14. Next visit Type: telephone ?      Visit with:Clinical Pharmacist ?       Date:12/07/21 ?       Time:2:30pm ? ?15. Additional Details? Yes. Patient states that he has been loosing weight and he is not trying to. States he has not changed his diet has been eating fast food a lot. He thought that he was gaining weight. He would like to know why he is loosing weight all of a sudden   ? ?Care Gaps ?Colonoscopy - 11/12/19 ?Diabetic Foot Exam - 11/11/20 ?Ophthalmology - 07/26/19 ?Dexa Scan - NA ?Annual Well Visit - NA ?Micro albumin - NA ?Hemoglobin A1c - 05/14/21 ?  ?Star Rating Drugs: ?Losartan-hydrochlorothiazide -last fill 09/21/21 90 ds ?Metformin - filled 07/26/21 90 ds ?Simvastatin - filled 09/21/21 90 ds ?  ? ?Ethelene Hal ?Clinical Pharmacist Assistant ?[phone]  ?

## 2021-11-02 ENCOUNTER — Telehealth: Payer: Medicare Other

## 2021-11-12 ENCOUNTER — Ambulatory Visit (INDEPENDENT_AMBULATORY_CARE_PROVIDER_SITE_OTHER): Payer: Medicare Other | Admitting: Internal Medicine

## 2021-11-12 ENCOUNTER — Encounter: Payer: Self-pay | Admitting: Internal Medicine

## 2021-11-12 VITALS — BP 132/74 | HR 80 | Resp 18 | Ht 69.0 in | Wt 181.0 lb

## 2021-11-12 DIAGNOSIS — E785 Hyperlipidemia, unspecified: Secondary | ICD-10-CM

## 2021-11-12 DIAGNOSIS — E1169 Type 2 diabetes mellitus with other specified complication: Secondary | ICD-10-CM

## 2021-11-12 DIAGNOSIS — E118 Type 2 diabetes mellitus with unspecified complications: Secondary | ICD-10-CM

## 2021-11-12 DIAGNOSIS — I10 Essential (primary) hypertension: Secondary | ICD-10-CM

## 2021-11-12 DIAGNOSIS — Z0000 Encounter for general adult medical examination without abnormal findings: Secondary | ICD-10-CM | POA: Diagnosis not present

## 2021-11-12 LAB — POCT GLYCOSYLATED HEMOGLOBIN (HGB A1C): Hemoglobin A1C: 6.3 % — AB (ref 4.0–5.6)

## 2021-11-12 MED ORDER — CLONIDINE 0.3 MG/24HR TD PTWK: 0.3000 mg | MEDICATED_PATCH | TRANSDERMAL | 12 refills | Status: DC

## 2021-11-12 NOTE — Progress Notes (Signed)
Subjective:   Patient ID: Drew Harrington, male    DOB: December 18, 1945, 76 y.o.   MRN: [id number]  HPI Here for medicare wellness and physical, no new complaints. Please see A/P for status and treatment of chronic medical problems.   Diet: DM since diabetic Physical activity: sedentary Depression/mood screen: negative Hearing: intact to whispered voice Visual acuity: grossly normal, overdue for annual eye exam  ADLs: capable Fall risk: low, some residual balance problems post stroke Home safety: good Cognitive evaluation: intact to orientation, naming, recall and repetition EOL planning: adv directives discussed  [place] Office Visit from 11/12/2021 in [place] at [place] Office Visit from 11/12/2021 in [place] at [facility]  PHQ-9 Total Score 0         03/23/2019    9:23 AM 03/23/2019    5:00 PM 03/24/2019    8:00 AM 11/11/2020    8:53 AM 11/12/2021   10:53 AM  Fall Risk  Falls in the past year?    0 0  Was there an injury with Fall?    0 0  Fall Risk Category Calculator    0 0  Fall Risk Category    Low Low  Patient Fall Risk Level Moderate fall risk Low fall risk Low fall risk Low fall risk     I have personally reviewed and have noted 1. The patient's medical and social history - reviewed today no changes 2. Their use of alcohol, tobacco or illicit drugs 3. Their current medications and supplements 4. The patient's functional ability including ADL's, fall risks, home safety risks and hearing or visual impairment. 5. Diet and physical activities 6. Evidence for depression or mood disorders 7. Care team reviewed and updated 8.  The patient is not on an opioid pain medication.  Patient Care Team: Hoyt Koch, MD as PCP - General (Internal Medicine) Charlton Haws, [facility] as Pharmacist (Pharmacist) 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[place])     Borderline diabetes    Cataract    bilateral lens implants   Diabetes mellitus without complication ([place])    HTN (hypertension)    Hyperlipidemia    Insomnia, unspecified    Perirectal abscess 05/10/2013   Personal history of colonic adenomas 04/10/2010   Psychosexual dysfunction with inhibited sexual excitement    Stroke ([place])    Subacute intracranial hemorrhage ([place])    Unspecified hemorrhoids without mention of complication    Past Surgical History:  Procedure Laterality Date   CATARACT EXTRACTION W/ INTRAOCULAR LENS  IMPLANT, BILATERAL     COLONOSCOPY     removal cyst hip Left 05/23/2013   TRANSTHORACIC ECHOCARDIOGRAM  04-06-2006   LVSF NORMAL/  EF 60%/ AORTIC ROOT AT UPPER LIMITS OF NORMAL SIZE   TRANSURETHRAL RESECTION OF BLADDER TUMOR  10/08/2011   Procedure: CIRCUMCISION AND TRANSURETHRAL RESECTION OF BLADDER TUMOR (TURBT);  Surgeon: [name], MD;  [facility];  Service: Urology;  Laterality: N/A;   TRANSURETHRAL RESECTION OF BLADDER TUMOR  11/30/2011   Procedure: TRANSURETHRAL RESECTION OF BLADDER TUMOR (TURBT);  Surgeon: [name], MD;  Location[facility];  Service: Urology;  Laterality: N/A;   Family History  Problem Relation Age of Onset   Lung disease Father    Cancer Father        lung   Cancer Sister  lung   Cancer Sister        lung   Cancer Sister        lung   Gout Other    Heart disease Other    Hypertension Other    Colon cancer Neg Hx    Esophageal cancer Neg Hx    Stomach cancer Neg Hx    Rectal cancer Neg Hx    Review of Systems  Constitutional: Negative.   HENT: Negative.    Eyes: Negative.   Respiratory:  Negative for cough, chest tightness and shortness of breath.   Cardiovascular:  Negative for chest pain, palpitations and leg swelling.  Gastrointestinal:  Negative for abdominal distention, abdominal pain, constipation, diarrhea, nausea and vomiting.   Musculoskeletal: Negative.   Skin: Negative.   Neurological: Negative.   Psychiatric/Behavioral: Negative.     Objective:  Physical Exam Constitutional:      Appearance: He is well-developed.  HENT:     Head: Normocephalic and atraumatic.  Cardiovascular:     Rate and Rhythm: Normal rate and regular rhythm.  Pulmonary:     Effort: Pulmonary effort is normal. No respiratory distress.     Breath sounds: Normal breath sounds. No wheezing or rales.  Abdominal:     General: Bowel sounds are normal. There is no distension.     Palpations: Abdomen is soft.     Tenderness: There is no abdominal tenderness. There is no rebound.  Musculoskeletal:     Cervical back: Normal range of motion.  Skin:    General: Skin is warm and dry.     Comments: Foot exam done  Neurological:     Mental Status: He is alert and oriented to person, place, and time.     Coordination: Coordination normal.    Vitals:   11/12/21 1049  BP: 132/74  Pulse: 80  Resp: 18  SpO2: 97%  Weight: 181 lb (82.1 kg)  Height: 5\' 9"  (1.753 m)    Assessment & Plan:

## 2021-11-12 NOTE — Assessment & Plan Note (Signed)
Flu shot yearly. Covid-19 counseled. Pneumonia counseled due. Shingrix counseled to get at pharmacy. Tetanus due 2030. Colonoscopy due 2024. Counseled about sun safety and mole surveillance. Counseled about the dangers of distracted driving. Given 10 year screening recommendations.

## 2021-11-12 NOTE — Patient Instructions (Addendum)
We will send in the clonidine patch to put on and switch weekly.  You can stop taking amlodipine.   Make sure to see the eye doctor.

## 2021-11-12 NOTE — Assessment & Plan Note (Signed)
Taking simvastatin 20 mg daily and most recent LDL at goal. Continue at current dosing.

## 2021-11-12 NOTE — Assessment & Plan Note (Signed)
BP stable today and he prefers to do patch and try to eliminate some oral medications. Will rx clonidine 0.3 mg patch and stop amlodipine 10 mg daily. Keep losartan/hctz 100/25 mg daily and keep metoprolol 25 mg daily (also treating tremor). Recent BMP without indication for change. He will let us know about BP in 2-3 weeks and we can adjust as needed based on BP readings.

## 2021-11-12 NOTE — Assessment & Plan Note (Addendum)
POC HgA1c done today 6.3 and we discussed de-escalation of medications including lowering metformin dose. He declines and prefers to leave the same since he is not having side effects and control is good. Continue metformin 1000 mg daily. Is on ARB and statin. Foot exam done, lipid panel up to date.

## 2021-11-16 ENCOUNTER — Ambulatory Visit: Payer: Medicare Other

## 2021-11-16 NOTE — Progress Notes (Signed)
   MWV was completed by PCP Dr. [name] per patient on 11/12/2021  L.Phillip Maffei,LPN

## 2021-11-17 ENCOUNTER — Telehealth: Payer: Self-pay | Admitting: Internal Medicine

## 2021-11-17 NOTE — Telephone Encounter (Signed)
Pt states MD wrote two rx for BP patches and pt states when he went to pick them up, one was $160 and the other $70.   Requesting call back from assistant to discuss alternative

## 2021-11-19 NOTE — Telephone Encounter (Signed)
There are no alternative patches. Keep oral regimen the same (keep taking amlodipine and do not start patch).

## 2021-11-24 NOTE — Telephone Encounter (Signed)
Called pt. Unable to lvm due to the phone constantly ringing.   If pt calls back please advise that Dr. [name] wants to keep taking amlodipine and not to start the patch.

## 2021-12-07 ENCOUNTER — Telehealth: Payer: Medicare Other

## 2021-12-25 ENCOUNTER — Ambulatory Visit (INDEPENDENT_AMBULATORY_CARE_PROVIDER_SITE_OTHER): Payer: Medicare Other

## 2021-12-25 ENCOUNTER — Ambulatory Visit (INDEPENDENT_AMBULATORY_CARE_PROVIDER_SITE_OTHER): Payer: Medicare Other | Admitting: Internal Medicine

## 2021-12-25 ENCOUNTER — Encounter: Payer: Self-pay | Admitting: Internal Medicine

## 2021-12-25 VITALS — BP 126/80 | HR 73 | Resp 18 | Ht 69.0 in | Wt 165.6 lb

## 2021-12-25 DIAGNOSIS — R413 Other amnesia: Secondary | ICD-10-CM | POA: Diagnosis not present

## 2021-12-25 DIAGNOSIS — R911 Solitary pulmonary nodule: Secondary | ICD-10-CM

## 2021-12-25 DIAGNOSIS — R531 Weakness: Secondary | ICD-10-CM | POA: Diagnosis not present

## 2021-12-25 DIAGNOSIS — R059 Cough, unspecified: Secondary | ICD-10-CM

## 2021-12-25 LAB — URINALYSIS, ROUTINE W REFLEX MICROSCOPIC
Bilirubin Urine: NEGATIVE
Hgb urine dipstick: NEGATIVE
Ketones, ur: NEGATIVE
Leukocytes,Ua: NEGATIVE
Nitrite: NEGATIVE
Specific Gravity, Urine: 1.025 (ref 1.000–1.030)
Total Protein, Urine: 30 — AB
Urine Glucose: NEGATIVE
Urobilinogen, UA: 2 — AB (ref 0.0–1.0)
pH: 5 (ref 5.0–8.0)

## 2021-12-25 LAB — COMPREHENSIVE METABOLIC PANEL WITH GFR
ALT: 35 U/L (ref 0–53)
AST: 35 U/L (ref 0–37)
Albumin: 3.7 g/dL (ref 3.5–5.2)
Alkaline Phosphatase: 164 U/L — ABNORMAL HIGH (ref 39–117)
BUN: 21 mg/dL (ref 6–23)
CO2: 31 meq/L (ref 19–32)
Calcium: 10.1 mg/dL (ref 8.4–10.5)
Chloride: 98 meq/L (ref 96–112)
Creatinine, Ser: 1.44 mg/dL (ref 0.40–1.50)
GFR: 47.1 mL/min — ABNORMAL LOW (ref >60.00)
Glucose, Bld: 143 mg/dL — ABNORMAL HIGH (ref 70–99)
Potassium: 3.6 meq/L (ref 3.5–5.1)
Sodium: 138 meq/L (ref 135–145)
Total Bilirubin: 0.4 mg/dL (ref 0.2–1.2)
Total Protein: 7.5 g/dL (ref 6.0–8.3)

## 2021-12-25 LAB — CBC
HCT: 43.9 % (ref 39.0–52.0)
Hemoglobin: 14.7 g/dL (ref 13.0–17.0)
MCHC: 33.5 g/dL (ref 30.0–36.0)
MCV: 92.1 fl (ref 78.0–100.0)
Platelets: 311 10*3/uL (ref 150.0–400.0)
RBC: 4.76 Mil/uL (ref 4.22–5.81)
RDW: 13.8 % (ref 11.5–15.5)
WBC: 10.5 10*3/uL (ref 4.0–10.5)

## 2021-12-25 LAB — VITAMIN D 25 HYDROXY (VIT D DEFICIENCY, FRACTURES): VITD: 21.24 ng/mL — ABNORMAL LOW (ref 30.00–100.00)

## 2021-12-25 LAB — TSH: TSH: 4.62 u[IU]/mL (ref 0.35–5.50)

## 2021-12-25 LAB — VITAMIN B12: Vitamin B-12: 673 pg/mL (ref 211–911)

## 2021-12-25 NOTE — Addendum Note (Signed)
Addended by: Pricilla Holm A on: 12/25/2021 09:25 PM   Modules accepted: Orders

## 2021-12-25 NOTE — Assessment & Plan Note (Signed)
Concern for new stroke given prior multiple strokes. BP is at goal and has been for some time. Also concern for vascular dementia reviewed MRI Feb 2023 with him and wife and wife was unaware of new chronic appearing strokes from 2021 on that imaging. Concern for infection given loss of weight and cough and poor appetite. Checking U/A, CXR, CBC, CMP, TSH, vitamin D and B12. Treat as appropriate. Ordered CT head to rule out new stroke outside treatment window.

## 2021-12-25 NOTE — Assessment & Plan Note (Signed)
Concerning for either new stroke or infection causing recurrence of prior stroke symptoms. Ordered CT head and CXR and U/A and CBC CMP to help assess.

## 2021-12-25 NOTE — Patient Instructions (Signed)
We will check a CT scan to look for any new strokes.  We will check a chest x-ray for the cough.

## 2021-12-25 NOTE — Assessment & Plan Note (Signed)
Rare and mostly at night. Given weight loss will check CXR. Could be GERD or PND, he admits to some PND.

## 2021-12-25 NOTE — Progress Notes (Signed)
   Subjective:   Patient ID: Drew Harrington, male    DOB: Jun 22, 1946, 76 y.o.   MRN: [id number]  HPI The patient and his wife come in with concerns about memory change and new weakness in the last 3 weeks (may be longer wife is home for summer from work and noticed when she has been home with him maybe slightly longer but not more than 3 months for sure). Right leg dragging more and cannot open jars when previously no problems. Sleeping more day and night and memory problems. Poor appetite and has lost 16 months in the last month without trying.   Review of Systems  Constitutional:  Positive for activity change, appetite change, fatigue and unexpected weight change.  HENT: Negative.    Eyes: Negative.   Respiratory:  Negative for cough, chest tightness and shortness of breath.   Cardiovascular:  Negative for chest pain, palpitations and leg swelling.  Gastrointestinal:  Negative for abdominal distention, abdominal pain, constipation, diarrhea, nausea and vomiting.  Musculoskeletal:  Positive for gait problem and myalgias.  Skin: Negative.   Neurological:  Positive for weakness.  Psychiatric/Behavioral: Negative.      Objective:  Physical Exam Constitutional:      Appearance: He is well-developed.  HENT:     Head: Normocephalic and atraumatic.  Cardiovascular:     Rate and Rhythm: Normal rate and regular rhythm.  Pulmonary:     Effort: Pulmonary effort is normal. No respiratory distress.     Breath sounds: Normal breath sounds. No wheezing or rales.  Abdominal:     General: Bowel sounds are normal. There is no distension.     Palpations: Abdomen is soft.     Tenderness: There is no abdominal tenderness. There is no rebound.  Musculoskeletal:     Cervical back: Normal range of motion.  Skin:    General: Skin is warm and dry.  Neurological:     Mental Status: He is alert and oriented to person, place, and time.     Cranial Nerves: Cranial nerve deficit present.     Motor:  Weakness present.     Coordination: Coordination abnormal.     Comments: Right leg and arm weakness     Vitals:   12/25/21 0847  BP: 126/80  Pulse: 73  Resp: 18  SpO2: 97%  Weight: 165 lb 9.6 oz (75.1 kg)  Height: 5\' 9"  (1.753 m)    Assessment & Plan:

## 2021-12-28 ENCOUNTER — Telehealth: Payer: Self-pay | Admitting: Internal Medicine

## 2021-12-28 NOTE — Telephone Encounter (Signed)
Tiffany with [facility] calls today to confirm that we did receive the results for PT's recent x-ray.

## 2021-12-30 ENCOUNTER — Ambulatory Visit
Admission: RE | Admit: 2021-12-30 | Discharge: 2021-12-30 | Disposition: A | Discharge status: Home / Self Care | Payer: Medicare Other | Source: Ambulatory Visit | Attending: Internal Medicine | Admitting: Internal Medicine

## 2021-12-30 DIAGNOSIS — I6782 Cerebral ischemia: Secondary | ICD-10-CM | POA: Diagnosis not present

## 2021-12-30 DIAGNOSIS — R413 Other amnesia: Secondary | ICD-10-CM | POA: Diagnosis not present

## 2021-12-30 DIAGNOSIS — I639 Cerebral infarction, unspecified: Secondary | ICD-10-CM | POA: Diagnosis not present

## 2021-12-30 DIAGNOSIS — J9811 Atelectasis: Secondary | ICD-10-CM | POA: Diagnosis not present

## 2021-12-30 DIAGNOSIS — J432 Centrilobular emphysema: Secondary | ICD-10-CM | POA: Diagnosis not present

## 2021-12-30 DIAGNOSIS — R911 Solitary pulmonary nodule: Secondary | ICD-10-CM

## 2021-12-30 DIAGNOSIS — R531 Weakness: Secondary | ICD-10-CM

## 2021-12-30 DIAGNOSIS — G319 Degenerative disease of nervous system, unspecified: Secondary | ICD-10-CM | POA: Diagnosis not present

## 2021-12-30 DIAGNOSIS — J90 Pleural effusion, not elsewhere classified: Secondary | ICD-10-CM | POA: Diagnosis not present

## 2021-12-30 DIAGNOSIS — J9859 Other diseases of mediastinum, not elsewhere classified: Secondary | ICD-10-CM | POA: Diagnosis not present

## 2021-12-30 MED ORDER — IOPAMIDOL (ISOVUE-300) INJECTION 61%
75.0000 mL | Freq: Once | INTRAVENOUS | Status: AC | PRN
  Administered 2021-12-30: 75 mL via INTRAVENOUS

## 2021-12-31 ENCOUNTER — Other Ambulatory Visit: Payer: Self-pay | Admitting: Internal Medicine

## 2021-12-31 ENCOUNTER — Telehealth: Payer: Self-pay | Admitting: Internal Medicine

## 2021-12-31 DIAGNOSIS — C3492 Malignant neoplasm of unspecified part of left bronchus or lung: Secondary | ICD-10-CM

## 2021-12-31 NOTE — Telephone Encounter (Signed)
Scheduled appt per 7/6 referral. Pt's wife is aware of appt date and time. Pt's wife is aware to arrive 15 mins prior to appt time and to bring and updated insurance card. Pt's wife is aware of appt location.

## 2022-01-06 ENCOUNTER — Other Ambulatory Visit: Payer: Self-pay

## 2022-01-06 ENCOUNTER — Inpatient Hospital Stay: Payer: Medicare Other

## 2022-01-06 ENCOUNTER — Inpatient Hospital Stay: Payer: Medicare Other | Attending: Internal Medicine | Admitting: Internal Medicine

## 2022-01-06 ENCOUNTER — Encounter: Payer: Self-pay | Admitting: Internal Medicine

## 2022-01-06 VITALS — BP 151/94 | HR 101 | Temp 98.0°F | Resp 18 | Ht 69.0 in | Wt 161.0 lb

## 2022-01-06 DIAGNOSIS — C3490 Malignant neoplasm of unspecified part of unspecified bronchus or lung: Secondary | ICD-10-CM | POA: Insufficient documentation

## 2022-01-06 DIAGNOSIS — R5383 Other fatigue: Secondary | ICD-10-CM

## 2022-01-06 DIAGNOSIS — R634 Abnormal weight loss: Secondary | ICD-10-CM

## 2022-01-06 DIAGNOSIS — I10 Essential (primary) hypertension: Secondary | ICD-10-CM

## 2022-01-06 DIAGNOSIS — R918 Other nonspecific abnormal finding of lung field: Secondary | ICD-10-CM

## 2022-01-06 DIAGNOSIS — Z87891 Personal history of nicotine dependence: Secondary | ICD-10-CM | POA: Insufficient documentation

## 2022-01-06 DIAGNOSIS — J984 Other disorders of lung: Secondary | ICD-10-CM

## 2022-01-06 LAB — CMP (CANCER CENTER ONLY)
ALT: 30 U/L (ref 0–44)
AST: 29 U/L (ref 15–41)
Albumin: 3.6 g/dL (ref 3.5–5.0)
Alkaline Phosphatase: 177 U/L — ABNORMAL HIGH (ref 38–126)
Anion gap: 10 (ref 5–15)
BUN: 18 mg/dL (ref 8–23)
CO2: 28 mmol/L (ref 22–32)
Calcium: 9.9 mg/dL (ref 8.9–10.3)
Chloride: 102 mmol/L (ref 98–111)
Creatinine: 1.39 mg/dL — ABNORMAL HIGH (ref 0.61–1.24)
GFR, Estimated: 53 mL/min — ABNORMAL LOW (ref >60)
Glucose, Bld: 129 mg/dL — ABNORMAL HIGH (ref 70–99)
Potassium: 3.5 mmol/L (ref 3.5–5.1)
Sodium: 140 mmol/L (ref 135–145)
Total Bilirubin: 0.7 mg/dL (ref 0.3–1.2)
Total Protein: 7.2 g/dL (ref 6.5–8.1)

## 2022-01-06 LAB — CBC WITH DIFFERENTIAL (CANCER CENTER ONLY)
Abs Immature Granulocytes: 0.04 10*3/uL (ref 0.00–0.07)
Basophils Absolute: 0.1 10*3/uL (ref 0.0–0.1)
Basophils Relative: 1 %
Eosinophils Absolute: 0.3 10*3/uL (ref 0.0–0.5)
Eosinophils Relative: 3 %
HCT: 41.9 % (ref 39.0–52.0)
Hemoglobin: 14.5 g/dL (ref 13.0–17.0)
Immature Granulocytes: 0 %
Lymphocytes Relative: 11 %
Lymphs Abs: 1.2 10*3/uL (ref 0.7–4.0)
MCH: 30.6 pg (ref 26.0–34.0)
MCHC: 34.6 g/dL (ref 30.0–36.0)
MCV: 88.4 fL (ref 80.0–100.0)
Monocytes Absolute: 0.9 10*3/uL (ref 0.1–1.0)
Monocytes Relative: 8 %
Neutro Abs: 8.4 10*3/uL — ABNORMAL HIGH (ref 1.7–7.7)
Neutrophils Relative %: 77 %
Platelet Count: 182 10*3/uL (ref 150–400)
RBC: 4.74 MIL/uL (ref 4.22–5.81)
RDW: 13 % (ref 11.5–15.5)
WBC Count: 10.9 10*3/uL — ABNORMAL HIGH (ref 4.0–10.5)
nRBC: 0 % (ref 0.0–0.2)

## 2022-01-06 NOTE — Patient Instructions (Signed)
Thank you for choosing [facility] to provide your care.   Should you have questions after your visit to the [facility]), please contact this office at [phone] between 8:30 AM and 4:30 PM.  Voice mails left after 4:00 PM may not be returned until the following business day.  Calls received after 4:30 PM will be answered by an off-site Nurse Triage Line.    Prescription Refills:  Please have your pharmacy contact us directly for most prescription requests.  Contact the office directly for refills of narcotics (pain medications). Allow 48-72 hours for refills.  Appointments: Please contact the [facility] scheduling department [phone] for questions regarding [facility] appointment scheduling.  Contact the schedulers with any scheduling changes so that your appointment can be rescheduled in a timely manner.   Central Scheduling for [facility] [phone] - Call to schedule PET scan, CT scan, MRI, and Ultrasound.  To afford each patient quality time with our providers, please arrive 30 minutes before your scheduled appointment time.  If you arrive late for your appointment, you may be asked to reschedule.  We strive to give you quality time with our providers, and arriving late affects you and other patients whose appointments are after yours. If you are a no show for multiple scheduled visits, you may be dismissed from the clinic at the providers discretion.     Resources: [place] [employer] [phone] for additional information on assistance programs or assistance connecting with community support programs   [place]  [phone]: Information regarding food stamps, Medicaid, and utility assistance [employer]'s shared-ride transportation service for eligible riders who have a disability that prevents them from riding the fixed route bus.   [place] [phone] Helps people with  Medicare understand their rights and benefits, navigate the Medicare system, and secure the quality healthcare they deserve American Cancer Society ([phone])809-5628 Assists patients locate various types of support and financial assistance Cancer Care: 1-800-813-HOPE 504-541-9040) Provides financial assistance, online support groups, medication/co-pay assistance.   Transportation Assistance for appointments at [facility] or provider support staff for referral to [place]   Again, thank you for choosing [facility] for your care.        Lung Cancer Lung cancer is an abnormal growth of cancerous cells that forms a mass (malignant tumor) in a lung. There are several types of lung cancer. The types are based on the appearance of the tumor cells. The two most common types are: Non-small cell lung cancer. This type of lung cancer is the most common type. Non-small cell lung cancers include squamous cell carcinoma, adenocarcinoma, and large cell carcinoma. Small cell lung cancer. In this type of lung cancer, abnormal cells are smaller than those of non-small cell lung cancer. Small cell lung cancer gets worse (progresses) faster than non-small cell lung cancer. What are the causes? The most common cause of lung cancer is smoking tobacco. The second most common cause is exposure to a chemical called radon. What increases the risk? You are more likely to develop this condition if: You smoke tobacco. You have been exposed to: Secondhand tobacco smoke. Radon gas. Uranium. Asbestos. Arsenic in drinking water. Air pollution and diesel exhaust. You have a family or personal history of lung cancer. You have had lung radiation therapy in the past. You are older than age 97. What are the signs or symptoms? In the early stages, you may not have  any symptoms. As the cancer progresses, symptoms may include: A lasting cough, possibly with blood. Fatigue. Unexplained  weight loss. Shortness of breath. High-pitched whistling sounds when you breathe, most often when you breathe out (wheezing). Chest pain. Loss of appetite. Symptoms of advanced lung cancer include: Hoarseness. Bone or joint pain. Weakness. Change in the structure of the fingernails (clubbing), so that the nail looks like an upside-down spoon. Swelling of the face or arms. Inability to move the face (paralysis). Drooping eyelids. How is this diagnosed? This condition may be diagnosed based on: Your symptoms and medical history. A physical exam. A chest X-ray. A CT scan. Blood tests. Sputum tests. Removal of a sample of lung tissue (lung biopsy) for testing. Your cancer will be assessed (staged) to determine how severe it is and how much it has spread (metastasized). How is this treated? Treatment depends on the type and stage of your cancer. Treatment may include one or more of the following: Surgery to remove as much of the cancer as possible. Lymph nodes in the area may be removed and tested for cancer as well. Medicines that kill cancer cells (chemotherapy). High-energy rays that kill cancer cells (radiation therapy). Targeted therapy. This targets specific parts of cancer cells and the area around them to block the growth and spread of the cancer. Targeted therapy can help limit the damage to healthy cells. Immunotherapy. This treatment uses a person's own immune system to fight cancer by either boosting the immune system or changing how the immune system works. Follow these instructions at home:  Do not use any products that contain nicotine or tobacco. These products include cigarettes, chewing tobacco, and vaping devices, such as e-cigarettes. If you need help quitting, ask your health care provider. Do not drink alcohol. If you are admitted to the hospital, make sure your cancer specialist (oncologist) is aware. Your cancer may affect your treatment for other  conditions. Take over-the-counter and prescription medicines only as told by your health care provider. Work with your health care provider to manage any side effects of treatment. Keep all follow-up visits. This is important. Where to find support Consider joining a local support group for people who have been diagnosed with lung cancer. Where to find more information American Cancer Society: www.cancer.[place] ([place]): www.cancer.gov Contact a health care provider if you: Lose weight without trying. Have a persistent cough and wheezing. Feel short of breath. Get tired easily. Have bone or joint pain. Have difficulty swallowing. Notice that your voice is changing or getting hoarse. Have pain that does not get better with medicine. Get help right away if you: Cough up blood. Have chest pain or new breathing problems. Have a fever. Have swelling in an ankle, leg, or arm, or the face or neck. Have paralysis in your face. Are very confused. Have a drooping eyelid. These symptoms may represent a serious problem that is an emergency. Do not wait to see if the symptoms will go away. Get medical help right away. Call your local emergency services (911 in the [place] Do not drive yourself to the hospital. Summary Lung cancer is an abnormal growth of cancerous cells that forms a mass (malignant tumor) in a lung. There are several types of lung cancer. The types are based on the appearance of the tumor cells. The two most common types are non-small cell and small cell. The most common cause of lung cancer is smoking tobacco. Early symptoms include a lasting cough, possibly with blood, and  fatigue, unexplained weight loss, and shortness of breath. After diagnosis, treatment depends on the type and stage of your cancer. This information is not intended to replace advice given to you by your health care provider. Make sure you discuss any questions you have with your health  care provider. Document Revised: 12/03/2020 Document Reviewed: 12/03/2020 Elsevier Patient Education  [place].

## 2022-01-06 NOTE — Progress Notes (Signed)
[place] Telephone:([phone]) [phone]   Fax:([phone]  CONSULT NOTE  REFERRING PHYSICIAN: Dr. Pricilla Holm  REASON FOR CONSULTATION:  76 years old African-American male with suspicious lung cancer.  HPI Drew Harrington is a 76 y.o. male with past medical history significant for diabetes mellitus, hypertension, dyslipidemia, history of colonic adenomas, history of bladder cancer as well as allergic rhinitis.  The patient was complaining of dry cough that has been going on for several days and he was seen by his primary care physician and had chest x-ray on 12/25/2021 and it showed soft tissue lesion measuring approximately 4.0 cm in dimension suspicious for underlying lung mass.  The patient was also complaining of neurodeficit and concern about acute stroke and memory changes with right leg weakness.  He had CT scan of the head without contrast on December 30, 2021 and that showed no evidence of acute intracranial abnormalities.  CT scan of the chest with contrast on 12/30/2021 showed large necrotic mass in the apical left upper lobe measuring 6.4 x 7.1 cm there was associated mediastinal and probable cirrhotic and late invasion.  There was additional groundglass in the posterior aspect of the left upper lobe and lingula as well as the left lower lobe.  The scan also showed necrotic appearing left supraclavicular lymph node measuring 1.6 cm, AP window lymph node measuring 1.3 cm and lower left paratracheal lymph node measuring 0.9 cm.  The left-sided mediastinal invasion by the large necrotic mass in the left upper lobe with left hilar adenopathy measuring 1.5 cm.  The left upper lobe mass exerts mass effect on the left subclavian vein which markedly narrowed or obstructed with extensive collateral venous flow. The patient was referred to me today for evaluation and recommendation regarding his condition. When seen today he is feeling fine with no concerning complaints except for  the dry cough and shortness of breath at baseline increased with exertion.  He has no chest pain or hemoptysis.  He denied having any nausea, vomiting, diarrhea or constipation.  He has no headache or visual changes.  He lost over 20 pounds recently. Family history significant for father with kidney cancer.  Mother had hypertension and sister had COPD. The patient is married and has 2 sons.  He was accompanied today by his wife Drew Harrington and his son Drew Harrington.  He used to work as an Restaurant manager, fast food.  He has a history of smoking for around 30 years but quit 30 years ago.  He has no history of alcohol or drug abuse.   HPI  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[place])    Borderline diabetes    Cataract    bilateral lens implants   Diabetes mellitus without complication (HCC)    HTN (hypertension)    Hyperlipidemia    Insomnia, unspecified    Perirectal abscess 05/10/2013   Personal history of colonic adenomas 04/10/2010   Psychosexual dysfunction with inhibited sexual excitement    Stroke ([place])    Subacute intracranial hemorrhage (HCC)    Unspecified hemorrhoids without mention of complication     Past Surgical History:  Procedure Laterality Date   CATARACT EXTRACTION W/ INTRAOCULAR LENS  IMPLANT, BILATERAL     COLONOSCOPY     removal cyst hip Left 05/23/2013   TRANSTHORACIC ECHOCARDIOGRAM  04-06-2006   LVSF NORMAL/  EF 60%/ AORTIC ROOT AT UPPER LIMITS OF NORMAL SIZE   TRANSURETHRAL RESECTION OF BLADDER TUMOR  10/08/2011   Procedure: CIRCUMCISION AND TRANSURETHRAL RESECTION OF BLADDER TUMOR (TURBT);  Surgeon: [name], MD;  [facility];  Service: Urology;  Laterality: N/A;   TRANSURETHRAL RESECTION OF BLADDER TUMOR  11/30/2011   Procedure: TRANSURETHRAL RESECTION OF BLADDER TUMOR (TURBT);  Surgeon: [name], MD;  Location: [facility];  Service: Urology;  Laterality: N/A;     Family History  Problem Relation Age of Onset   Lung disease Father    Cancer Father        lung   Cancer Sister        lung   Cancer Sister        lung   Cancer Sister        lung   Gout Other    Heart disease Other    Hypertension Other    Colon cancer Neg Hx    Esophageal cancer Neg Hx    Stomach cancer Neg Hx    Rectal cancer Neg Hx     Social History Social History   Tobacco Use   Smoking status: Former   Smokeless tobacco: Current    Types: Chew   Tobacco comments:    last used 3 weeks ago  Substance Use Topics   Alcohol use: No   Drug use: No    Allergies  Allergen Reactions   Sildenafil Other (See Comments)    Caused a headache    Current Outpatient Medications  Medication Sig Dispense Refill   aspirin 81 MG EC tablet Take 81 mg by mouth daily.     cloNIDine (CATAPRES - DOSED IN MG/24 HR) 0.3 mg/24hr patch Place 1 patch (0.3 mg total) onto the skin once a week. (Patient not taking: Reported on 12/25/2021) 4 patch 12   donepezil (ARICEPT) 5 MG tablet Take 5 mg by mouth at bedtime. (Patient not taking: Reported on 11/12/2021)     losartan-hydrochlorothiazide (HYZAAR) 100-25 MG tablet Take 1 tablet by mouth daily. 90 tablet 3   metFORMIN (GLUCOPHAGE) 1000 MG tablet Take 1 tablet (1,000 mg total) by mouth daily with breakfast. 90 tablet 3   metoprolol succinate (TOPROL-XL) 25 MG 24 hr tablet TAKE 1 TABLET(25 MG) BY MOUTH DAILY 90 tablet 0   potassium chloride SA (KLOR-CON) 20 MEQ tablet Take 1 tablet (20 mEq total) by mouth daily. 90 tablet 3   primidone (MYSOLINE) 250 MG tablet Take 250 mg by mouth See admin instructions. 2 tablets in AM and 3 tablets at bedtime     simvastatin (ZOCOR) 20 MG tablet TAKE 1 TABLET(20 MG) BY MOUTH AT BEDTIME (Patient not taking: Reported on 12/25/2021) 90 tablet 3   Triamcinolone Acetonide (TRIAMCINOLONE 0.1 % CREAM : EUCERIN) CREA Apply 1 application topically 2 (two) times daily as needed. Disp 1 pound 1 each 1   No current  facility-administered medications for this visit.    Review of Systems  Constitutional: positive for fatigue and weight loss Eyes: negative Ears, nose, mouth, throat, and face: negative Respiratory: positive for cough and dyspnea on exertion Cardiovascular: negative Gastrointestinal: negative Genitourinary:negative Integument/breast: negative Hematologic/lymphatic: negative Musculoskeletal:negative Neurological: negative Behavioral/Psych: negative Endocrine: negative Allergic/Immunologic: negative  Physical Exam  BWG:YKZLD, healthy, no distress, well nourished, and well developed SKIN: skin color, texture, turgor are normal, no rashes or significant lesions HEAD: Normocephalic, No masses, lesions, tenderness or abnormalities EYES: normal, PERRLA, Conjunctiva are pink and non-injected EARS: External ears normal, Canals clear OROPHARYNX:no exudate, no erythema, and lips, buccal mucosa, and tongue normal  NECK: supple, no adenopathy, no JVD LYMPH:  no palpable lymphadenopathy, no hepatosplenomegaly LUNGS: coarse sounds heard, decreased breath sounds HEART: regular rate & rhythm, no murmurs, and no gallops ABDOMEN:abdomen soft, non-tender, normal bowel sounds, and no masses or organomegaly BACK: Back symmetric, no curvature., No CVA tenderness EXTREMITIES:no joint deformities, effusion, or inflammation, no edema  NEURO: alert & oriented x 3 with fluent speech, no focal motor/sensory deficits  PERFORMANCE STATUS: ECOG 1  LABORATORY DATA: Lab Results  Component Value Date   WBC 10.9 (H) 01/06/2022   HGB 14.5 01/06/2022   HCT 41.9 01/06/2022   MCV 88.4 01/06/2022   PLT 182 01/06/2022      Chemistry      Component Value Date/Time   NA 140 01/06/2022 1117   K 3.5 01/06/2022 1117   CL 102 01/06/2022 1117   CO2 28 01/06/2022 1117   BUN 18 01/06/2022 1117   CREATININE 1.39 (H) 01/06/2022 1117      Component Value Date/Time   CALCIUM 9.9 01/06/2022 1117   ALKPHOS 177  (H) 01/06/2022 1117   AST 29 01/06/2022 1117   ALT 30 01/06/2022 1117   BILITOT 0.7 01/06/2022 1117       RADIOGRAPHIC STUDIES: CT Chest W Contrast  Result Date: 12/30/2021 CLINICAL DATA:  Abnormal chest radiograph, lung mass. History of bladder cancer. Weight loss. EXAM: CT CHEST WITH CONTRAST TECHNIQUE: Multidetector CT imaging of the chest was performed during intravenous contrast administration. RADIATION DOSE REDUCTION: This exam was performed according to the departmental dose-optimization program which includes automated exposure control, adjustment of the mA and/or kV according to patient size and/or use of iterative reconstruction technique. CONTRAST:  72mL ISOVUE-300 IOPAMIDOL (ISOVUE-300) INJECTION 61% COMPARISON:  None Available. FINDINGS: Cardiovascular: Peripheral filling defect is seen at the bifurcation of the right upper lobe and interlobar pulmonary artery (6/190 3-212). There is eccentric soft tissue along the lateral wall of the distal aortic arch and proximal descending thoracic aorta. Markedly narrowed or obstructed left subclavian vein with extensive collateral venous flow. Atherosclerotic calcification of the aorta and coronary arteries. Enlarged pulmonic trunk. Heart size normal. Left ventricular hypertrophy. No pericardial effusion. Mediastinum/Nodes: 14 mm low-attenuation left thyroid nodule. No follow-up recommended. (Ref: J Am Coll Radiol. 2015 Feb;12(2): 143-50).Necrotic appearing left supraclavicular lymph node measures 1.6 cm (2/17). AP window lymph nodes measure up to 1.3 cm. Low left paratracheal lymph node measures 9 mm. There is left-sided mediastinal invasion by a large necrotic mass in the left upper lobe. Left hilar adenopathy measures up to 1.5 cm. No right hilar or axillary adenopathy. Esophagus is grossly unremarkable. Lungs/Pleura: Centrilobular and paraseptal emphysema. Large necrotic mass in the apical left upper lobe measures 6.4 x 7.1 cm. Associated  mediastinal and probable thoracic inlet invasion. Septal thickening and ground-glass in the apical left upper lobe. Additional ground-glass in the posterior aspect of the left upper lobe and lingula as well as left lower lobe. Findings suggest a component of pneumonitis/lymphedema. Minimal dependent atelectasis in the right lung. Small left pleural effusion. Upper Abdomen: Liver is decreased in attenuation diffusely. Low-attenuation lesions in the liver measure up to 3.7 cm and are likely cysts. Numerous stones and/or calcified polyps in the gallbladder. Nodular thickening of the right adrenal gland. Left adrenal mass measures 2.6 x 3.9 cm. 1.9 cm fluid density cyst in the right kidney. No follow-up necessary. Partially imaged 1.4 cm low-attenuation lesion in the left kidney. No specific follow-up necessary. Spleen, pancreas, stomach and bowel are grossly unremarkable. Small gastrohepatic ligament  lymph nodes. Musculoskeletal: Degenerative changes in the spine. No worrisome lytic or sclerotic lesions. IMPRESSION: 1. Large necrotic appearing left upper lobe mass with mediastinal and suspected thoracic inlet invasion, left supraclavicular/mediastinal/left hilar adenopathy and left adrenal mass, compatible with stage IV lung cancer. 2. 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 3. Probable bland peripheral tumor thrombus within the distal aortic arch and proximal descending thoracic aorta. 4. Peripheral filling defect at the bifurcation of the right upper lobe and interlobar pulmonary arteries, indicative of a chronic pulmonary embolus. These results will be called to the ordering clinician or representative by the Radiologist Assistant, and communication documented in the PACS or [employer]. 5. Left upper lobe mass exerts mass effect on the left subclavian vein which is markedly narrowed or obstructed with extensive collateral venous flow. 6. Small left pleural effusion. 7. Hepatic steatosis. 8. Gallstones  and/or gallbladder polyps. 9. Aortic atherosclerosis (ICD10-I70.0). Coronary artery calcification. 10. Enlarged pulmonic trunk, indicative of pulmonary arterial hypertension. 11.  Emphysema (ICD10-J43.9). Electronically Signed   By: Lorin Picket M.D.   On: 12/30/2021 16:35   CT HEAD WO CONTRAST (5MM)  Result Date: 12/30/2021 CLINICAL DATA:  Neuro deficit, acute, stroke suspected. Memory changes/memory loss. Right leg weakness. EXAM: CT HEAD WITHOUT CONTRAST TECHNIQUE: Contiguous axial images were obtained from the base of the skull through the vertex without intravenous contrast. RADIATION DOSE REDUCTION: This exam was performed according to the departmental dose-optimization program which includes automated exposure control, adjustment of the mA and/or kV according to patient size and/or use of iterative reconstruction technique. COMPARISON:  Head MRI 08/01/2021 FINDINGS: Brain: There is no evidence of an acute infarct, intracranial hemorrhage, mass, midline shift, or extra-axial fluid collection. 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 Hypodensities in the cerebral white matter bilaterally are nonspecific but compatible with moderate chronic small vessel ischemic disease. There is mild cerebral atrophy. Vascular: Calcified atherosclerosis at the skull base. No hyperdense vessel. Skull: No fracture or suspicious osseous lesion. Sinuses/Orbits: Similar mucosal thickening in the paranasal sinuses, moderate in the posterior left ethmoid air cells. Clear mastoid air cells. Bilateral cataract extraction. Other: None. IMPRESSION: 1. No evidence of acute intracranial abnormality. 2. 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 Electronically Signed   By: Logan Bores M.D.   On: 12/30/2021 13:07   DG Chest 2 View  Result Date: 12/25/2021 CLINICAL DATA:  Dry cough for several days, initial  encounter EXAM: CHEST - 2 VIEW COMPARISON:  None Available. FINDINGS: Cardiac shadow is within normal limits. Lungs are well aerated bilaterally. In the medial aspect of left apex there is a soft tissue lesion which measures approximately 4 cm in dimension suspicious for underlying lung mass. CT of the chest with contrast material is recommended. No other focal abnormality is noted. IMPRESSION: Soft tissue mass in the medial aspect of the left lung apex. Further evaluation by means of CT of the chest with contrast is recommended. These results will be called to the ordering clinician or representative by the Radiologist Assistant, and communication documented in the PACS or [employer]. Electronically Signed   By: Inez Catalina M.D.   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   PLAN: I had a lengthy discussion with the patient and his family today about his current condition and further investigation to confirm his diagnosis and possible treatment options. I personally and independently reviewed the scan images and discussed the result and showed the images to the patient and his family. I recommended for the patient to complete the staging work-up by ordering a PET scan as well as MRI of the brain to rule out disease metastasis. I will also refer the patient to Dr. Valeta Harms for consideration of bronchoscopy and tissue biopsy. I will also refer the patient to radiation oncology for consideration of palliative course of radiotherapy to the left apical lung mass that causing thoracic inlet invasion. I will see the patient back for follow-up visit in around 2 weeks for evaluation and more detailed discussion of his  treatment options based on the final staging work-up and biopsy results. The patient was advised to call immediately if he has any other concerning symptoms in the interval.  The patient voices understanding of current disease status and treatment options and is in agreement with the current care plan.  All questions were answered. The patient knows to call the clinic with any problems, questions or concerns. We can certainly see the patient much sooner if necessary.  Thank you so much for allowing me to participate in the care of [name]. I will continue to follow up the patient with you and assist in his care.  The total time spent in the appointment was 90 minutes.  Disclaimer: This note was dictated with voice recognition software. Similar sounding words can inadvertently be transcribed and may not be corrected upon review.   Eilleen Kempf January 06, 2022, 12:01 PM

## 2022-01-07 ENCOUNTER — Telehealth: Payer: Self-pay | Admitting: Medical Oncology

## 2022-01-07 ENCOUNTER — Telehealth: Payer: Self-pay | Admitting: Internal Medicine

## 2022-01-07 NOTE — Telephone Encounter (Signed)
Sputum-"Had an episode yesterday of productive cough where wife said he coughed up 1/2 cup of yellowish  ,pink tinged ? Sputum. She stated he is not having any trouble breathing .  I told wife to monitor Drew Harrington symptoms for now and to call 911 if he experiences bright red blood , trouble breathing , chest pain. She voiced understanding.

## 2022-01-07 NOTE — Telephone Encounter (Signed)
Scheduled per 07/12 los, patient has been called and notified of upcoming appointments.

## 2022-01-08 ENCOUNTER — Telehealth: Payer: Self-pay | Admitting: Radiation Oncology

## 2022-01-08 NOTE — Telephone Encounter (Signed)
7/14 @ 3:27 PM Left voicemail for patient to call our office to be schedule for consult with Dr. Sondra Come.

## 2022-01-10 ENCOUNTER — Other Ambulatory Visit: Payer: Self-pay | Admitting: Internal Medicine

## 2022-01-10 DIAGNOSIS — I10 Essential (primary) hypertension: Secondary | ICD-10-CM

## 2022-01-11 ENCOUNTER — Other Ambulatory Visit: Payer: Self-pay

## 2022-01-11 ENCOUNTER — Telehealth: Payer: Self-pay | Admitting: Radiation Oncology

## 2022-01-11 ENCOUNTER — Encounter (HOSPITAL_COMMUNITY): Payer: Self-pay | Admitting: Pulmonary Disease

## 2022-01-11 ENCOUNTER — Telehealth: Payer: Self-pay | Admitting: Pulmonary Disease

## 2022-01-11 DIAGNOSIS — R918 Other nonspecific abnormal finding of lung field: Secondary | ICD-10-CM | POA: Insufficient documentation

## 2022-01-11 NOTE — Pre-Procedure Instructions (Signed)
[id number] [address], [address] AT [facility] [address] [place] [address] Phone: [phone] Fax: [place], [address] [place] [place] [address] Phone: [phone] Fax: [phone]   PCP - Pricilla Holm, MD Cardiologist - N/A  Chest x-ray - 12/25/21 EKG - DOS ECHO - 04/09/22  ERAS Protcol - NPO  COVID TEST- DOS  Anesthesia review: N  Patient verbally denies any shortness of breath, fever, cough and chest pain during phone call   -------------  SDW INSTRUCTIONS given:  Your procedure is scheduled on 01/13/22.  Report to [facility] Main Entrance "A" at 0530 A.M., and check in at the Admitting office.  Call this number if you have problems the morning of surgery:  [phone]   Remember:  Do not eat or drink after midnight the night before your surgery     Take these medicines the morning of surgery with A SIP OF WATER  Metoprolol Primidone  As of today, STOP taking any Aspirin (unless otherwise instructed by your surgeon) Aleve, Naproxen, Ibuprofen, Motrin, Advil, Goody's, BC's, all herbal medications, fish oil, and all vitamins.                      Do not wear jewelry, make up, or nail polish            Do not wear lotions, powders, perfumes/colognes, or deodorant.            Do not shave 48 hours prior to surgery.  Men may shave face and neck.            Do not bring valuables to the hospital.            [facility] is not responsible for any belongings or valuables.  Do NOT Smoke (Tobacco/Vaping) 24 hours prior to your procedure If you use a CPAP at night, you may bring all equipment for your overnight stay.   Contacts, glasses, dentures or bridgework may not be worn into surgery.      For patients admitted to the hospital, discharge time will be determined by your treatment team.   Patients discharged the day  of surgery will not be allowed to drive home, and someone needs to stay with them for 24 hours.    Special instructions:   [place]- Preparing For Surgery  Before surgery, you can play an important role. Because skin is not sterile, your skin needs to be as free of germs as possible. You can reduce the number of germs on your skin by washing with CHG (chlorahexidine gluconate) Soap before surgery.  CHG is an antiseptic cleaner which kills germs and bonds with the skin to continue killing germs even after washing.    Oral Hygiene is also important to reduce your risk of infection.  Remember - BRUSH YOUR TEETH THE MORNING OF SURGERY WITH YOUR REGULAR TOOTHPASTE  Please do not use if you have an allergy to CHG or antibacterial soaps. If your skin becomes reddened/irritated stop using the CHG.  Do not shave (including legs and underarms) for at least 48 hours prior to first CHG shower. It is OK to shave your face.  Please follow these instructions carefully.   Shower the NIGHT BEFORE SURGERY and the MORNING OF SURGERY with DIAL Soap.   Pat yourself dry with a CLEAN TOWEL.  Wear CLEAN  PAJAMAS to bed the night before surgery  Place CLEAN SHEETS on your bed the night of your first shower and DO NOT SLEEP WITH PETS.   Day of Surgery: Please shower morning of surgery  Wear Clean/Comfortable clothing the morning of surgery Do not apply any deodorants/lotions.   Remember to brush your teeth WITH YOUR REGULAR TOOTHPASTE.   Questions were answered. Patient verbalized understanding of instructions.

## 2022-01-11 NOTE — Telephone Encounter (Signed)
7/17 @ 8:27 am Left voicemail for patient to call our office to be schedule for consult with Dr. Sondra Come.

## 2022-01-11 NOTE — Telephone Encounter (Signed)
7/17 @ 1:29 pm F/U call to pt's spouse [phone], no answer/hangs up).  Also called [phone] Left voicemail to return our call.

## 2022-01-11 NOTE — Telephone Encounter (Signed)
PCCM:  I called and spoke with the patient and wife. We are trying to expedite the procedure that he needs.  We will plan for procedure tomorrow.  N.p.o. past midnight. Patient is not on any blood thinners or antiplatelets. Orders placed for video bronchoscopy with endobronchial ultrasound.  Patient is agreeable to proceed.  Garner Nash, DO [facility] 01/11/2022 11:55 AM

## 2022-01-11 NOTE — Telephone Encounter (Signed)
Pt has been scheduled for 7/19 at 8:30 per [facility].  Spoke to pt's wife and she states will not take him anywhere but our office for covid test.  Told her for them to arrive at [facility] at 5:30 on 7/19 so he can get covid prior.

## 2022-01-13 ENCOUNTER — Other Ambulatory Visit: Payer: Self-pay

## 2022-01-13 ENCOUNTER — Encounter (HOSPITAL_COMMUNITY): Payer: Self-pay | Admitting: Pulmonary Disease

## 2022-01-13 ENCOUNTER — Ambulatory Visit (HOSPITAL_COMMUNITY): Payer: Medicare Other | Admitting: Anesthesiology

## 2022-01-13 ENCOUNTER — Encounter (HOSPITAL_COMMUNITY)
Admission: RE | Disposition: A | Discharge status: Home / Self Care | Payer: Self-pay | Source: Ambulatory Visit | Attending: Pulmonary Disease

## 2022-01-13 ENCOUNTER — Ambulatory Visit (HOSPITAL_BASED_OUTPATIENT_CLINIC_OR_DEPARTMENT_OTHER): Payer: Medicare Other | Admitting: Anesthesiology

## 2022-01-13 ENCOUNTER — Ambulatory Visit (HOSPITAL_BASED_OUTPATIENT_CLINIC_OR_DEPARTMENT_OTHER)
Admission: RE | Admit: 2022-01-13 | Discharge: 2022-01-13 | Disposition: A | Discharge status: Home / Self Care | Payer: Medicare Other | Source: Ambulatory Visit | Attending: Pulmonary Disease | Admitting: Pulmonary Disease

## 2022-01-13 DIAGNOSIS — C801 Malignant (primary) neoplasm, unspecified: Secondary | ICD-10-CM | POA: Diagnosis not present

## 2022-01-13 DIAGNOSIS — I82411 Acute embolism and thrombosis of right femoral vein: Secondary | ICD-10-CM | POA: Diagnosis not present

## 2022-01-13 DIAGNOSIS — J910 Malignant pleural effusion: Secondary | ICD-10-CM | POA: Diagnosis not present

## 2022-01-13 DIAGNOSIS — R059 Cough, unspecified: Secondary | ICD-10-CM | POA: Diagnosis not present

## 2022-01-13 DIAGNOSIS — Z87891 Personal history of nicotine dependence: Secondary | ICD-10-CM | POA: Insufficient documentation

## 2022-01-13 DIAGNOSIS — Z7984 Long term (current) use of oral hypoglycemic drugs: Secondary | ICD-10-CM

## 2022-01-13 DIAGNOSIS — Z9841 Cataract extraction status, right eye: Secondary | ICD-10-CM | POA: Diagnosis not present

## 2022-01-13 DIAGNOSIS — R042 Hemoptysis: Secondary | ICD-10-CM | POA: Diagnosis not present

## 2022-01-13 DIAGNOSIS — C3432 Malignant neoplasm of lower lobe, left bronchus or lung: Secondary | ICD-10-CM | POA: Insufficient documentation

## 2022-01-13 DIAGNOSIS — R918 Other nonspecific abnormal finding of lung field: Secondary | ICD-10-CM | POA: Diagnosis not present

## 2022-01-13 DIAGNOSIS — I129 Hypertensive chronic kidney disease with stage 1 through stage 4 chronic kidney disease, or unspecified chronic kidney disease: Secondary | ICD-10-CM | POA: Diagnosis not present

## 2022-01-13 DIAGNOSIS — E119 Type 2 diabetes mellitus without complications: Secondary | ICD-10-CM

## 2022-01-13 DIAGNOSIS — G4700 Insomnia, unspecified: Secondary | ICD-10-CM | POA: Diagnosis not present

## 2022-01-13 DIAGNOSIS — Z7901 Long term (current) use of anticoagulants: Secondary | ICD-10-CM | POA: Diagnosis not present

## 2022-01-13 DIAGNOSIS — J432 Centrilobular emphysema: Secondary | ICD-10-CM | POA: Diagnosis not present

## 2022-01-13 DIAGNOSIS — I700 Atherosclerosis of aorta: Secondary | ICD-10-CM | POA: Diagnosis not present

## 2022-01-13 DIAGNOSIS — G9389 Other specified disorders of brain: Secondary | ICD-10-CM | POA: Diagnosis not present

## 2022-01-13 DIAGNOSIS — I2782 Chronic pulmonary embolism: Secondary | ICD-10-CM | POA: Diagnosis not present

## 2022-01-13 DIAGNOSIS — Z8673 Personal history of transient ischemic attack (TIA), and cerebral infarction without residual deficits: Secondary | ICD-10-CM | POA: Diagnosis not present

## 2022-01-13 DIAGNOSIS — Z20822 Contact with and (suspected) exposure to covid-19: Secondary | ICD-10-CM | POA: Insufficient documentation

## 2022-01-13 DIAGNOSIS — Z79899 Other long term (current) drug therapy: Secondary | ICD-10-CM | POA: Insufficient documentation

## 2022-01-13 DIAGNOSIS — E785 Hyperlipidemia, unspecified: Secondary | ICD-10-CM | POA: Diagnosis not present

## 2022-01-13 DIAGNOSIS — I499 Cardiac arrhythmia, unspecified: Secondary | ICD-10-CM | POA: Diagnosis not present

## 2022-01-13 DIAGNOSIS — C771 Secondary and unspecified malignant neoplasm of intrathoracic lymph nodes: Secondary | ICD-10-CM | POA: Insufficient documentation

## 2022-01-13 DIAGNOSIS — M6281 Muscle weakness (generalized): Secondary | ICD-10-CM | POA: Diagnosis not present

## 2022-01-13 DIAGNOSIS — R58 Hemorrhage, not elsewhere classified: Secondary | ICD-10-CM | POA: Diagnosis not present

## 2022-01-13 DIAGNOSIS — J90 Pleural effusion, not elsewhere classified: Secondary | ICD-10-CM | POA: Diagnosis not present

## 2022-01-13 DIAGNOSIS — I10 Essential (primary) hypertension: Secondary | ICD-10-CM | POA: Insufficient documentation

## 2022-01-13 DIAGNOSIS — Z743 Need for continuous supervision: Secondary | ICD-10-CM | POA: Diagnosis not present

## 2022-01-13 DIAGNOSIS — E1122 Type 2 diabetes mellitus with diabetic chronic kidney disease: Secondary | ICD-10-CM | POA: Diagnosis not present

## 2022-01-13 DIAGNOSIS — Z66 Do not resuscitate: Secondary | ICD-10-CM | POA: Diagnosis not present

## 2022-01-13 DIAGNOSIS — I2510 Atherosclerotic heart disease of native coronary artery without angina pectoris: Secondary | ICD-10-CM | POA: Diagnosis not present

## 2022-01-13 DIAGNOSIS — J309 Allergic rhinitis, unspecified: Secondary | ICD-10-CM | POA: Diagnosis not present

## 2022-01-13 DIAGNOSIS — I248 Other forms of acute ischemic heart disease: Secondary | ICD-10-CM | POA: Diagnosis not present

## 2022-01-13 DIAGNOSIS — C3490 Malignant neoplasm of unspecified part of unspecified bronchus or lung: Secondary | ICD-10-CM | POA: Diagnosis not present

## 2022-01-13 DIAGNOSIS — N1831 Chronic kidney disease, stage 3a: Secondary | ICD-10-CM | POA: Diagnosis not present

## 2022-01-13 DIAGNOSIS — R0602 Shortness of breath: Secondary | ICD-10-CM | POA: Diagnosis not present

## 2022-01-13 DIAGNOSIS — Z961 Presence of intraocular lens: Secondary | ICD-10-CM | POA: Diagnosis not present

## 2022-01-13 DIAGNOSIS — I2609 Other pulmonary embolism with acute cor pulmonale: Secondary | ICD-10-CM | POA: Diagnosis not present

## 2022-01-13 DIAGNOSIS — J9811 Atelectasis: Secondary | ICD-10-CM | POA: Diagnosis not present

## 2022-01-13 DIAGNOSIS — J9601 Acute respiratory failure with hypoxia: Secondary | ICD-10-CM | POA: Diagnosis not present

## 2022-01-13 DIAGNOSIS — I2699 Other pulmonary embolism without acute cor pulmonale: Secondary | ICD-10-CM | POA: Diagnosis not present

## 2022-01-13 DIAGNOSIS — R6889 Other general symptoms and signs: Secondary | ICD-10-CM | POA: Diagnosis not present

## 2022-01-13 DIAGNOSIS — Z8249 Family history of ischemic heart disease and other diseases of the circulatory system: Secondary | ICD-10-CM | POA: Diagnosis not present

## 2022-01-13 DIAGNOSIS — I4510 Unspecified right bundle-branch block: Secondary | ICD-10-CM | POA: Diagnosis not present

## 2022-01-13 HISTORY — PX: VIDEO BRONCHOSCOPY WITH ENDOBRONCHIAL ULTRASOUND: SHX6177

## 2022-01-13 HISTORY — PX: BRONCHIAL NEEDLE ASPIRATION BIOPSY: SHX5106

## 2022-01-13 LAB — GLUCOSE, CAPILLARY: Glucose-Capillary: 150 mg/dL — ABNORMAL HIGH (ref 70–99)

## 2022-01-13 LAB — SARS CORONAVIRUS 2 BY RT PCR: SARS Coronavirus 2 by RT PCR: NEGATIVE

## 2022-01-13 SURGERY — BRONCHOSCOPY, WITH EBUS
Anesthesia: General | Laterality: Left

## 2022-01-13 MED ORDER — FENTANYL CITRATE (PF) 250 MCG/5ML IJ SOLN
INTRAMUSCULAR | Status: DC | PRN
  Administered 2022-01-13 (×2): 50 ug via INTRAVENOUS

## 2022-01-13 MED ORDER — ONDANSETRON HCL 4 MG/2ML IJ SOLN
INTRAMUSCULAR | Status: DC | PRN
  Administered 2022-01-13: 4 mg via INTRAVENOUS

## 2022-01-13 MED ORDER — CHLORHEXIDINE GLUCONATE 0.12 % MT SOLN
OROMUCOSAL | Status: AC
  Administered 2022-01-13: 15 mL via OROMUCOSAL
  Filled 2022-01-13: qty 15

## 2022-01-13 MED ORDER — CHLORHEXIDINE GLUCONATE 0.12 % MT SOLN: 15.0000 mL | Freq: Once | OROMUCOSAL | Status: AC

## 2022-01-13 MED ORDER — METOPROLOL SUCCINATE ER 25 MG PO TB24
25.0000 mg | ORAL_TABLET | Freq: Once | ORAL | Status: AC
  Administered 2022-01-13: 25 mg via ORAL
  Filled 2022-01-13 (×2): qty 1

## 2022-01-13 MED ORDER — SUGAMMADEX SODIUM 200 MG/2ML IV SOLN
INTRAVENOUS | Status: DC | PRN
  Administered 2022-01-13: 200 mg via INTRAVENOUS

## 2022-01-13 MED ORDER — INSULIN ASPART 100 UNIT/ML IJ SOLN: 0.0000 [IU] | INTRAMUSCULAR | Status: DC | PRN

## 2022-01-13 MED ORDER — ROCURONIUM BROMIDE 10 MG/ML (PF) SYRINGE
PREFILLED_SYRINGE | INTRAVENOUS | Status: DC | PRN
  Administered 2022-01-13: 50 mg via INTRAVENOUS

## 2022-01-13 MED ORDER — LACTATED RINGERS IV SOLN: INTRAVENOUS | Status: DC

## 2022-01-13 MED ORDER — PHENYLEPHRINE 80 MCG/ML (10ML) SYRINGE FOR IV PUSH (FOR BLOOD PRESSURE SUPPORT)
PREFILLED_SYRINGE | INTRAVENOUS | Status: DC | PRN
  Administered 2022-01-13 (×2): 160 ug via INTRAVENOUS

## 2022-01-13 MED ORDER — DEXAMETHASONE SODIUM PHOSPHATE 10 MG/ML IJ SOLN
INTRAMUSCULAR | Status: DC | PRN
  Administered 2022-01-13: 10 mg via INTRAVENOUS

## 2022-01-13 MED ORDER — PROPOFOL 10 MG/ML IV BOLUS
INTRAVENOUS | Status: DC | PRN
  Administered 2022-01-13: 140 mg via INTRAVENOUS

## 2022-01-13 MED ORDER — EPHEDRINE SULFATE-NACL 50-0.9 MG/10ML-% IV SOSY
PREFILLED_SYRINGE | INTRAVENOUS | Status: DC | PRN
  Administered 2022-01-13: 10 mg via INTRAVENOUS

## 2022-01-13 MED ORDER — LIDOCAINE 2% (20 MG/ML) 5 ML SYRINGE
INTRAMUSCULAR | Status: DC | PRN
  Administered 2022-01-13: 100 mg via INTRAVENOUS

## 2022-01-13 SURGICAL SUPPLY — 30 items
BRUSH CYTOL CELLEBRITY 1.5X140 (MISCELLANEOUS) IMPLANT
CANISTER SUCT 3000ML PPV (MISCELLANEOUS) ×4 IMPLANT
CONT SPEC 4OZ CLIKSEAL STRL BL (MISCELLANEOUS) ×4 IMPLANT
COVER BACK TABLE 60X90IN (DRAPES) ×4 IMPLANT
COVER DOME SNAP 22 D (MISCELLANEOUS) ×4 IMPLANT
FORCEPS BIOP RJ4 1.8 (CUTTING FORCEPS) IMPLANT
GAUZE SPONGE 4X4 12PLY STRL (GAUZE/BANDAGES/DRESSINGS) ×4 IMPLANT
GLOVE BIO SURGEON STRL SZ7.5 (GLOVE) ×4 IMPLANT
GOWN STRL REUS W/ TWL LRG LVL3 (GOWN DISPOSABLE) ×3 IMPLANT
GOWN STRL REUS W/TWL LRG LVL3 (GOWN DISPOSABLE) ×3
KIT CLEAN ENDO COMPLIANCE (KITS) ×8 IMPLANT
KIT TURNOVER KIT B (KITS) ×4 IMPLANT
MARKER SKIN DUAL TIP RULER LAB (MISCELLANEOUS) ×4 IMPLANT
NDL EBUS SONO TIP PENTAX (NEEDLE) ×3 IMPLANT
NEEDLE EBUS SONO TIP PENTAX (NEEDLE) ×3 IMPLANT
NS IRRIG 1000ML POUR BTL (IV SOLUTION) ×4 IMPLANT
OIL SILICONE PENTAX (PARTS (SERVICE/REPAIRS)) ×4 IMPLANT
PAD ARMBOARD 7.5X6 YLW CONV (MISCELLANEOUS) ×8 IMPLANT
SOL ANTI FOG 6CC (MISCELLANEOUS) ×3 IMPLANT
SOLUTION ANTI FOG 6CC (MISCELLANEOUS) ×1
SYR 20CC LL (SYRINGE) ×8 IMPLANT
SYR 20ML ECCENTRIC (SYRINGE) ×8 IMPLANT
SYR 50ML SLIP (SYRINGE) IMPLANT
SYR 5ML LUER SLIP (SYRINGE) ×4 IMPLANT
TOWEL OR 17X24 6PK STRL BLUE (TOWEL DISPOSABLE) ×4 IMPLANT
TRAP SPECIMEN MUCOUS 40CC (MISCELLANEOUS) IMPLANT
TUBE CONNECTING 20X1/4 (TUBING) ×8 IMPLANT
UNDERPAD 30X30 (UNDERPADS AND DIAPERS) ×4 IMPLANT
VALVE DISPOSABLE (MISCELLANEOUS) ×4 IMPLANT
WATER STERILE IRR 1000ML POUR (IV SOLUTION) ×4 IMPLANT

## 2022-01-13 NOTE — Interval H&P Note (Signed)
History and Physical Interval Note:  01/13/2022 8:39 AM  Drew Harrington  has presented today for surgery, with the diagnosis of lung mass.  The various methods of treatment have been discussed with the patient and family. After consideration of risks, benefits and other options for treatment, the patient has consented to  Procedure(s): Bliss (Left) as a surgical intervention.  The patient's history has been reviewed, patient examined, no change in status, stable for surgery.  I have reviewed the patient's chart and labs.  Questions were answered to the patient's satisfaction.     [place]

## 2022-01-13 NOTE — Anesthesia Postprocedure Evaluation (Signed)
Anesthesia Post Note  Patient: Drew Harrington  Procedure(s) Performed: VIDEO BRONCHOSCOPY WITH ENDOBRONCHIAL ULTRASOUND (Left) BRONCHIAL NEEDLE ASPIRATION BIOPSIES     Patient location during evaluation: PACU Anesthesia Type: General Level of consciousness: awake and alert Pain management: pain level controlled Vital Signs Assessment: post-procedure vital signs reviewed and stable Respiratory status: spontaneous breathing, nonlabored ventilation and respiratory function stable Cardiovascular status: blood pressure returned to baseline Postop Assessment: no apparent nausea or vomiting Anesthetic complications: no   No notable events documented.  Last Vitals:  Vitals:   01/13/22 0935 01/13/22 0950  BP: (!) 158/83 (!) 167/88  Pulse: 94 96  Resp: (!) 22 20  Temp: 36.4 C 36.7 C  SpO2: 93% 93%    Last Pain:  Vitals:   01/13/22 0950  TempSrc:   PainSc: 0-No pain                 Marthenia Rolling

## 2022-01-13 NOTE — Consult Note (Signed)
Synopsis: Referred in July 2023 for abnormal CT chest by No ref. provider found  Subjective:   PATIENT ID: Drew Harrington GENDER: male DOB: 1945/10/06, MRN: [id number]  C/o : lung mass  This is a 76 year old gentleman, past medical history of bladder cancer, diabetes, hypertension, hyperlipidemia.Patient had CT scan of the chest on 12/30/2021 found to have a large necrotic appearing left upper lobe mass with associated adenopathy suspected adrenal metastasis concerning for primary lung cancer.  Patient was referred for biopsy.    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[place])    Borderline diabetes    Cataract    bilateral lens implants   Diabetes mellitus without complication (HCC)    HTN (hypertension)    Hyperlipidemia    Insomnia, unspecified    Perirectal abscess 05/10/2013   Personal history of colonic adenomas 04/10/2010   Psychosexual dysfunction with inhibited sexual excitement    Stroke ([place])    Subacute intracranial hemorrhage (HCC)    Unspecified hemorrhoids without mention of complication      Family History  Problem Relation Age of Onset   Lung disease Father    Cancer Father        lung   Cancer Sister        lung   Cancer Sister        lung   Cancer Sister        lung   Gout Other    Heart disease Other    Hypertension Other    Colon cancer Neg Hx    Esophageal cancer Neg Hx    Stomach cancer Neg Hx    Rectal cancer Neg Hx      Past Surgical History:  Procedure Laterality Date   CATARACT EXTRACTION W/ INTRAOCULAR LENS  IMPLANT, BILATERAL     COLONOSCOPY     removal cyst hip Left 05/23/2013   TRANSTHORACIC ECHOCARDIOGRAM  04-06-2006   LVSF NORMAL/  EF 60%/ AORTIC ROOT AT UPPER LIMITS OF NORMAL SIZE   TRANSURETHRAL RESECTION OF BLADDER TUMOR  10/08/2011   Procedure: CIRCUMCISION AND TRANSURETHRAL RESECTION OF BLADDER TUMOR (TURBT);  Surgeon: [name], MD;  Location: [facility];  Service: Urology;  Laterality: N/A;   TRANSURETHRAL RESECTION OF BLADDER TUMOR  11/30/2011   Procedure: TRANSURETHRAL RESECTION OF BLADDER TUMOR (TURBT);  Surgeon: [name], MD;  Location: [facility];  Service: Urology;  Laterality: N/A;    Social History   Socioeconomic History   Marital status: Married    Spouse name: Not on file   Number of children: Not on file   Years of education: Not on file   Highest education level: Not on file  Occupational History   Occupation: Part Time Ortho Tech    Employer: [employer]  Tobacco Use   Smoking status: Former   Smokeless tobacco: Current    Types: Chew   Tobacco comments:    last used 3 weeks ago  Substance and Sexual Activity   Alcohol use: No   Drug use: No   Sexual activity: Yes  Other Topics Concern   Not on file  Social History Narrative   HSG   Trained as orthopedic tech   Married-divorced; remarried '78   2 sons - '68, '79   Social Determinants of Health   Financial Resource Strain: Low Risk  (03/17/2020)   Overall Financial Resource Strain (CARDIA)    Difficulty of  Paying Living Expenses: Not hard at all  Food Insecurity: Not on file  Transportation Needs: Not on file  Physical Activity: Not on file  Stress: Not on file  Social Connections: Not on file  Intimate Partner Violence: Not on file     Allergies  Allergen Reactions   Sildenafil Other (See Comments)    Caused a headache     @ENCMEDSTART @  Review of Systems  Constitutional:  Negative for chills, fever, malaise/fatigue and weight loss.  HENT:  Negative for hearing loss, sore throat and tinnitus.   Eyes:  Negative for blurred vision and double vision.  Respiratory:  Positive for hemoptysis and shortness of breath. Negative for cough, sputum production, wheezing and stridor.   Cardiovascular:  Negative for chest pain, palpitations, orthopnea, leg swelling and PND.  Gastrointestinal:  Negative for abdominal pain,  constipation, diarrhea, heartburn, nausea and vomiting.  Genitourinary:  Negative for dysuria, hematuria and urgency.  Musculoskeletal:  Negative for joint pain and myalgias.  Skin:  Negative for itching and rash.  Neurological:  Negative for dizziness, tingling, weakness and headaches.  Endo/Heme/Allergies:  Negative for environmental allergies. Does not bruise/bleed easily.  Psychiatric/Behavioral:  Negative for depression. The patient is not nervous/anxious and does not have insomnia.   All other systems reviewed and are negative.    Objective:  Physical Exam Vitals reviewed.  Constitutional:      General: He is not in acute distress.    Appearance: He is well-developed.     Comments: Elderly thin appearing  HENT:     Head: Normocephalic and atraumatic.  Eyes:     General: No scleral icterus.    Conjunctiva/sclera: Conjunctivae normal.     Pupils: Pupils are equal, round, and reactive to light.  Neck:     Vascular: No JVD.     Trachea: No tracheal deviation.  Cardiovascular:     Rate and Rhythm: Normal rate and regular rhythm.     Heart sounds: Normal heart sounds. No murmur heard. Pulmonary:     Effort: Pulmonary effort is normal. No tachypnea, accessory muscle usage or respiratory distress.     Breath sounds: No stridor. No wheezing, rhonchi or rales.  Abdominal:     General: There is no distension.     Palpations: Abdomen is soft.     Tenderness: There is no abdominal tenderness.  Musculoskeletal:        General: No tenderness.     Cervical back: Neck supple.  Lymphadenopathy:     Cervical: No cervical adenopathy.  Skin:    General: Skin is warm and dry.     Capillary Refill: Capillary refill takes less than 2 seconds.     Findings: No rash.  Neurological:     Mental Status: He is alert and oriented to person, place, and time.  Psychiatric:        Behavior: Behavior normal.     Vitals:   01/11/22 1714 01/13/22 0655  BP:  (!) 175/109  Pulse:  (!) 103   Resp:  18  Temp:  97.9 F (36.6 C)  TempSrc:  Oral  SpO2:  96%  Weight: 73 kg 76.2 kg  Height: 5\' 9"  (1.753 m) 5\' 9"  (1.753 m)   96% on RA BMI Readings from Last 3 Encounters:  01/13/22 24.81 kg/m  01/06/22 23.78 kg/m  12/25/21 24.45 kg/m   Wt Readings from Last 3 Encounters:  01/13/22 76.2 kg  01/06/22 73 kg  12/25/21 75.1 kg     CBC  Component Value Date/Time   WBC 10.9 (H) 01/06/2022 1117   WBC 10.5 12/25/2021 0928   RBC 4.74 01/06/2022 1117   HGB 14.5 01/06/2022 1117   HCT 41.9 01/06/2022 1117   PLT 182 01/06/2022 1117   MCV 88.4 01/06/2022 1117   MCH 30.6 01/06/2022 1117   MCHC 34.6 01/06/2022 1117   RDW 13.0 01/06/2022 1117   LYMPHSABS 1.2 01/06/2022 1117   MONOABS 0.9 01/06/2022 1117   EOSABS 0.3 01/06/2022 1117   BASOSABS 0.1 01/06/2022 1117     Chest Imaging: CT chest July 2023: Left upper lobe necrotic appearing mass  Pulmonary Functions Testing Results:     No data to display          FeNO:   Pathology:   Echocardiogram:   Heart Catheterization:     Assessment & Plan:   Left upper lobe lung mass, adenopathy, 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 Plan for videobronchoscope endobronchial ultrasound transbronchial needle aspiration. If there is visible tumor within the area we will also biopsy this with endobronchial biopsies and possible cryotherapy. Patient is agreeable to proceed. We discussed the risk of bleeding and pneumothorax.  This was discussed with patient's wife at bedside.   Current Facility-Administered Medications:    insulin aspart (novoLOG) injection 0-7 Units, 0-7 Units, Subcutaneous, Q2H PRN, Daiva Huge, Leanord Hawking, MD   lactated ringers infusion, , Intravenous, Continuous, Howze, Leanord Hawking, MD  I spent 62 minutes dedicated to the care of this patient on the date of this  encounter to include pre-visit review of records, face-to-face time with the patient discussing conditions above, post visit ordering of testing, clinical documentation with the electronic health record, making appropriate referrals as documented, and communicating necessary findings to members of the patients care team.   Garner Nash, DO [facility] 01/13/2022 8:35 AM

## 2022-01-13 NOTE — Anesthesia Preprocedure Evaluation (Addendum)
Anesthesia Evaluation  Patient identified by MRN, date of birth, ID band Patient awake and Patient confused    Reviewed: Allergy & Precautions, NPO status , Patient's Chart, lab work & pertinent test results, reviewed documented beta blocker date and time   History of Anesthesia Complications Negative for: history of anesthetic complications  Airway Mallampati: II  TM Distance: >3 FB Neck ROM: Full    Dental  (+) Edentulous Lower, Edentulous Upper   Pulmonary former smoker,  Lung mass   Pulmonary exam normal        Cardiovascular hypertension, Pt. on home beta blockers and Pt. on medications Normal cardiovascular exam     Neuro/Psych CVA negative psych ROS   GI/Hepatic negative GI ROS, Neg liver ROS,   Endo/Other  diabetes, Type 2, Oral Hypoglycemic Agents  Renal/GU Cr 1.39  negative genitourinary   Musculoskeletal negative musculoskeletal ROS (+)   Abdominal   Peds  Hematology negative hematology ROS (+)   Anesthesia Other Findings Day of surgery medications reviewed with patient.  Reproductive/Obstetrics negative OB ROS                            Anesthesia Physical Anesthesia Plan  ASA: 3  Anesthesia Plan: General   Post-op Pain Management: Minimal or no pain anticipated   Induction: Intravenous  PONV Risk Score and Plan: 2 and Treatment may vary due to age or medical condition, Ondansetron and Dexamethasone  Airway Management Planned: Oral ETT  Additional Equipment: None  Intra-op Plan:   Post-operative Plan: Extubation in OR  Informed Consent: I have reviewed the patients History and Physical, chart, labs and discussed the procedure including the risks, benefits and alternatives for the proposed anesthesia with the patient or authorized representative who has indicated his/her understanding and acceptance.     Dental advisory given  Plan Discussed with:  CRNA  Anesthesia Plan Comments:        Anesthesia Quick Evaluation

## 2022-01-13 NOTE — Anesthesia Procedure Notes (Signed)
Procedure Name: Intubation Date/Time: 01/13/2022 8:50 AM  Performed by: Lance Coon, CRNAPre-anesthesia Checklist: Patient identified, Emergency Drugs available, Suction available, Patient being monitored and Timeout performed Patient Re-evaluated:Patient Re-evaluated prior to induction Oxygen Delivery Method: Circle system utilized Preoxygenation: Pre-oxygenation with 100% oxygen Induction Type: IV induction Ventilation: Mask ventilation without difficulty Laryngoscope Size: Miller and 3 Grade View: Grade I Tube type: Oral Tube size: 8.5 mm Number of attempts: 1 Airway Equipment and Method: Stylet Placement Confirmation: ETT inserted through vocal cords under direct vision, positive ETCO2 and breath sounds checked- equal and bilateral Secured at: 21 cm Dental Injury: Teeth and Oropharynx as per pre-operative assessment

## 2022-01-13 NOTE — H&P (View-Only) (Signed)
Synopsis: Referred in July 2023 for abnormal CT chest by No ref. 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  Patient was referred for biopsy.    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[place])    Borderline diabetes    Cataract    bilateral lens implants   Diabetes mellitus without complication (HCC)    HTN (hypertension)    Hyperlipidemia    Insomnia, unspecified    Perirectal abscess 05/10/2013   Personal history of colonic adenomas 04/10/2010   Psychosexual dysfunction with inhibited sexual excitement    Stroke ([place])    Subacute intracranial hemorrhage (HCC)    Unspecified hemorrhoids without mention of complication      Family History  Problem Relation Age of Onset   Lung disease Father    Cancer Father        lung   Cancer Sister        lung   Cancer Sister        lung   Cancer Sister        lung   Gout Other    Heart disease Other    Hypertension Other    Colon cancer Neg Hx    Esophageal cancer Neg Hx    Stomach cancer Neg Hx    Rectal cancer Neg Hx      Past Surgical History:  Procedure Laterality Date   CATARACT EXTRACTION W/ INTRAOCULAR LENS  IMPLANT, BILATERAL     COLONOSCOPY     removal cyst hip Left 05/23/2013   TRANSTHORACIC ECHOCARDIOGRAM  04-06-2006   LVSF NORMAL/  EF 60%/ AORTIC ROOT AT UPPER LIMITS OF NORMAL SIZE   TRANSURETHRAL RESECTION OF BLADDER TUMOR  10/08/2011   Procedure: CIRCUMCISION AND TRANSURETHRAL RESECTION OF BLADDER TUMOR (TURBT);  Surgeon: [name], MD;  Location: [facility];  Service: Urology;  Laterality: N/A;   TRANSURETHRAL RESECTION OF BLADDER TUMOR  11/30/2011   Procedure: TRANSURETHRAL RESECTION OF BLADDER TUMOR (TURBT);  Surgeon: [name], MD;  Location: [facility];  Service: Urology;  Laterality: N/A;    Social History   Socioeconomic History   Marital status: Married    Spouse name: Not on file   Number of children: Not on file   Years of education: Not on file   Highest education level: Not on file  Occupational History   Occupation: Part Time Ortho Tech    Employer: [employer]  Tobacco Use   Smoking status: Former   Smokeless tobacco: Current    Types: Chew   Tobacco comments:    last used 3 weeks ago  Substance and Sexual Activity   Alcohol use: No   Drug use: No   Sexual activity: Yes  Other Topics Concern   Not on file  Social History Narrative   HSG   Trained as orthopedic tech   Married-divorced; remarried '78   2 sons - '68, '79   Social Determinants of Health   Financial Resource Strain: Low Risk  (03/17/2020)   Overall Financial Resource Strain (CARDIA)    Difficulty of  Paying Living Expenses: Not hard at all  Food Insecurity: Not on file  Transportation Needs: Not on file  Physical Activity: Not on file  Stress: Not on file  Social Connections: Not on file  Intimate Partner Violence: Not on file     Allergies  Allergen Reactions   Sildenafil Other (See Comments)    Caused a headache     @ENCMEDSTART @  Review of Systems  Constitutional:  Negative for chills, fever, malaise/fatigue and weight loss.  HENT:  Negative for hearing loss, sore throat and tinnitus.   Eyes:  Negative for blurred vision and double vision.  Respiratory:  Positive for hemoptysis and shortness of breath. Negative for cough, sputum production, wheezing and stridor.   Cardiovascular:  Negative for chest pain, palpitations, orthopnea, leg swelling and PND.  Gastrointestinal:  Negative for abdominal pain,  constipation, diarrhea, heartburn, nausea and vomiting.  Genitourinary:  Negative for dysuria, hematuria and urgency.  Musculoskeletal:  Negative for joint pain and myalgias.  Skin:  Negative for itching and rash.  Neurological:  Negative for dizziness, tingling, weakness and headaches.  Endo/Heme/Allergies:  Negative for environmental allergies. Does not bruise/bleed easily.  Psychiatric/Behavioral:  Negative for depression. The patient is not nervous/anxious and does not have insomnia.   All other systems reviewed and are negative.    Objective:  Physical Exam Vitals reviewed.  Constitutional:      General: He is not in acute distress.    Appearance: He is well-developed.     Comments: Elderly thin appearing  HENT:     Head: Normocephalic and atraumatic.  Eyes:     General: No scleral icterus.    Conjunctiva/sclera: Conjunctivae normal.     Pupils: Pupils are equal, round, and reactive to light.  Neck:     Vascular: No JVD.     Trachea: No tracheal deviation.  Cardiovascular:     Rate and Rhythm: Normal rate and regular rhythm.     Heart sounds: Normal heart sounds. No murmur heard. Pulmonary:     Effort: Pulmonary effort is normal. No tachypnea, accessory muscle usage or respiratory distress.     Breath sounds: No stridor. No wheezing, rhonchi or rales.  Abdominal:     General: There is no distension.     Palpations: Abdomen is soft.     Tenderness: There is no abdominal tenderness.  Musculoskeletal:        General: No tenderness.     Cervical back: Neck supple.  Lymphadenopathy:     Cervical: No cervical adenopathy.  Skin:    General: Skin is warm and dry.     Capillary Refill: Capillary refill takes less than 2 seconds.     Findings: No rash.  Neurological:     Mental Status: He is alert and oriented to person, place, and time.  Psychiatric:        Behavior: Behavior normal.     Vitals:   01/11/22 1714 01/13/22 0655  BP:  (!) 175/109  Pulse:  (!) 103   Resp:  18  Temp:  97.9 F (36.6 C)  TempSrc:  Oral  SpO2:  96%  Weight: 73 kg 76.2 kg  Height: 5\' 9"  (1.753 m) 5\' 9"  (1.753 m)   96% on RA BMI Readings from Last 3 Encounters:  01/13/22 24.81 kg/m  01/06/22 23.78 kg/m  12/25/21 24.45 kg/m   Wt Readings from Last 3 Encounters:  01/13/22 76.2 kg  01/06/22 73 kg  12/25/21 75.1 kg     CBC  Component Value Date/Time   WBC 10.9 (H) 01/06/2022 1117   WBC 10.5 12/25/2021 0928   RBC 4.74 01/06/2022 1117   HGB 14.5 01/06/2022 1117   HCT 41.9 01/06/2022 1117   PLT 182 01/06/2022 1117   MCV 88.4 01/06/2022 1117   MCH 30.6 01/06/2022 1117   MCHC 34.6 01/06/2022 1117   RDW 13.0 01/06/2022 1117   LYMPHSABS 1.2 01/06/2022 1117   MONOABS 0.9 01/06/2022 1117   EOSABS 0.3 01/06/2022 1117   BASOSABS 0.1 01/06/2022 1117     Chest Imaging: CT chest July 2023: Left upper lobe necrotic appearing mass  Pulmonary Functions Testing Results:     No data to display          FeNO:   Pathology:   Echocardiogram:   Heart Catheterization:     Assessment & Plan:   Left upper lobe lung mass, adenopathy, 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 Plan for videobronchoscope endobronchial ultrasound transbronchial needle aspiration. If there is visible tumor within the area we will also biopsy this with endobronchial biopsies and possible cryotherapy. Patient is agreeable to proceed. We discussed the risk of bleeding and pneumothorax.  This was discussed with patient's wife at bedside.   Current Facility-Administered Medications:    insulin aspart (novoLOG) injection 0-7 Units, 0-7 Units, Subcutaneous, Q2H PRN, Daiva Huge, Leanord Hawking, MD   lactated ringers infusion, , Intravenous, Continuous, Howze, Leanord Hawking, MD  I spent 62 minutes dedicated to the care of this patient on the date of this  encounter to include pre-visit review of records, face-to-face time with the patient discussing conditions above, post visit ordering of testing, clinical documentation with the electronic health record, making appropriate referrals as documented, and communicating necessary findings to members of the patients care team.   Garner Nash, DO [place] [facility] 01/13/2022 8:35 AM

## 2022-01-13 NOTE — Op Note (Signed)
Video Bronchoscopy with Endobronchial Ultrasound Procedure Note  Date of Operation: 01/13/2022  Pre-op Diagnosis: Lung mass  Post-op Diagnosis: Lung mass  Surgeon: Leory Plowman L Rhya Shan,DO   Assistants: None   Anesthesia: General endotracheal anesthesia  Operation: Flexible video fiberoptic bronchoscopy with endobronchial ultrasound and biopsies.  Estimated Blood Loss: Minimal  Complications: None   Indications and History: Drew Harrington is a 76 y.o. male with Lung mass.  The risks, benefits, complications, treatment options and expected outcomes were discussed with the patient.  The possibilities of pneumothorax, pneumonia, reaction to medication, pulmonary aspiration, perforation of a viscus, bleeding, failure to diagnose a condition and creating a complication requiring transfusion or operation were discussed with the patient who freely signed the consent.    Description of Procedure: The patient was examined in the preoperative area and history and data from the preprocedure consultation were reviewed. It was deemed appropriate to proceed.  The patient was taken to 3, identified as Drew Harrington and the procedure verified as Flexible Video Fiberoptic Bronchoscopy.  A Time Out was held and the above information confirmed. After being taken to the operating room general anesthesia was initiated and the patient  was orally intubated. The video fiberoptic bronchoscope was introduced via the endotracheal tube and a general inspection was performed which showed normal right and left lung anatomy no evidence of endobronchial lesion. The standard scope was then withdrawn and the endobronchial ultrasound was used to identify and characterize the peritracheal, hilar and bronchial lymph nodes. Inspection showed enlarged for 4L as well as a visible left upper lobe mass through tracheal wall. Using real-time ultrasound guidance Wang needle biopsies were take from Station 4L/LUL lung mass nodes and  were sent for cytology. The patient tolerated the procedure well without apparent complications. There was no significant blood loss. The bronchoscope was withdrawn. Anesthesia was reversed and the patient was taken to the PACU for recovery.   Samples: 1. Wang needle biopsies from 4L node /LUL mass  Plans:  The patient will be discharged from the PACU to home when recovered from anesthesia. We will review the cytology, pathology results with the patient when they become available. Outpatient followup will be with Garner Nash, DO.   Garner Nash, DO [place] [facility] 01/13/2022 9:57 AM

## 2022-01-13 NOTE — Transfer of Care (Signed)
Immediate Anesthesia Transfer of Care Note  Patient: Drew Harrington  Procedure(s) Performed: VIDEO BRONCHOSCOPY WITH ENDOBRONCHIAL ULTRASOUND (Left) BRONCHIAL NEEDLE ASPIRATION BIOPSIES  Patient Location: PACU  Anesthesia Type:General  Level of Consciousness: drowsy and patient cooperative  Airway & Oxygen Therapy: Patient Spontanous Breathing  Post-op Assessment: Report given to RN and Post -op Vital signs reviewed and stable  Post vital signs: Reviewed and stable  Last Vitals:  Vitals Value Taken Time  BP 160/96 01/13/22 0932  Temp    Pulse 92 01/13/22 0932  Resp 15 01/13/22 0932  SpO2 93 % 01/13/22 0932  Vitals shown include unvalidated device data.  Last Pain:  Vitals:   01/13/22 0722  TempSrc:   PainSc: 0-No pain         Complications: No notable events documented.

## 2022-01-13 NOTE — Discharge Instructions (Signed)
Flexible Bronchoscopy, Care After This sheet gives you information about how to care for yourself after your test. Your doctor may also give you more specific instructions. If you have problems or questions, contact your doctor. Follow these instructions at home: Eating and drinking Do not eat or drink anything (not even water) for 2 hours after your test, or until your numbing medicine (local anesthetic) wears off. When your numbness is gone and your cough and gag reflexes have come back, you may: Eat only soft foods. Slowly drink liquids. The day after the test, go back to your normal diet. Driving Do not drive for 24 hours if you were given a medicine to help you relax (sedative). Do not drive or use heavy machinery while taking prescription pain medicine. General instructions  Take over-the-counter and prescription medicines only as told by your doctor. Return to your normal activities as told. Ask what activities are safe for you. Do not use any products that have nicotine or tobacco in them. This includes cigarettes and e-cigarettes. If you need help quitting, ask your doctor. Keep all follow-up visits as told by your doctor. This is important. It is very important if you had a tissue sample (biopsy) taken. Get help right away if: You have shortness of breath that gets worse. You get light-headed. You feel like you are going to pass out (faint). You have chest pain. You cough up: More than a little blood. More blood than before. Summary Do not eat or drink anything (not even water) for 2 hours after your test, or until your numbing medicine wears off. Do not use cigarettes. Do not use e-cigarettes. Get help right away if you have chest pain.  This information is not intended to replace advice given to you by your health care provider. Make sure you discuss any questions you have with your health care provider. Document Released: 04/11/2009 Document Revised: 05/27/2017 Document  Reviewed: 07/02/2016 Elsevier Patient Education  2020 [employer].

## 2022-01-14 ENCOUNTER — Other Ambulatory Visit: Payer: Self-pay

## 2022-01-14 ENCOUNTER — Encounter (HOSPITAL_COMMUNITY): Payer: Self-pay

## 2022-01-14 ENCOUNTER — Inpatient Hospital Stay (HOSPITAL_COMMUNITY)
Admission: EM | Admit: 2022-01-14 | Discharge: 2022-01-20 | DRG: 166 | Disposition: A | Discharge status: Home Health | Payer: Medicare Other | Attending: Internal Medicine | Admitting: Internal Medicine

## 2022-01-14 ENCOUNTER — Emergency Department (HOSPITAL_COMMUNITY): Payer: Medicare Other

## 2022-01-14 DIAGNOSIS — Z79899 Other long term (current) drug therapy: Secondary | ICD-10-CM

## 2022-01-14 DIAGNOSIS — I700 Atherosclerosis of aorta: Secondary | ICD-10-CM | POA: Diagnosis present

## 2022-01-14 DIAGNOSIS — Z8551 Personal history of malignant neoplasm of bladder: Secondary | ICD-10-CM

## 2022-01-14 DIAGNOSIS — I2782 Chronic pulmonary embolism: Secondary | ICD-10-CM | POA: Diagnosis present

## 2022-01-14 DIAGNOSIS — E785 Hyperlipidemia, unspecified: Secondary | ICD-10-CM | POA: Diagnosis present

## 2022-01-14 DIAGNOSIS — I2609 Other pulmonary embolism with acute cor pulmonale: Principal | ICD-10-CM | POA: Diagnosis present

## 2022-01-14 DIAGNOSIS — Z66 Do not resuscitate: Secondary | ICD-10-CM | POA: Diagnosis not present

## 2022-01-14 DIAGNOSIS — N1831 Chronic kidney disease, stage 3a: Secondary | ICD-10-CM | POA: Diagnosis present

## 2022-01-14 DIAGNOSIS — Z8249 Family history of ischemic heart disease and other diseases of the circulatory system: Secondary | ICD-10-CM

## 2022-01-14 DIAGNOSIS — J910 Malignant pleural effusion: Secondary | ICD-10-CM | POA: Diagnosis present

## 2022-01-14 DIAGNOSIS — I82411 Acute embolism and thrombosis of right femoral vein: Secondary | ICD-10-CM | POA: Diagnosis present

## 2022-01-14 DIAGNOSIS — Z7984 Long term (current) use of oral hypoglycemic drugs: Secondary | ICD-10-CM

## 2022-01-14 DIAGNOSIS — Z961 Presence of intraocular lens: Secondary | ICD-10-CM | POA: Diagnosis present

## 2022-01-14 DIAGNOSIS — I2699 Other pulmonary embolism without acute cor pulmonale: Secondary | ICD-10-CM | POA: Diagnosis not present

## 2022-01-14 DIAGNOSIS — Z9842 Cataract extraction status, left eye: Secondary | ICD-10-CM

## 2022-01-14 DIAGNOSIS — Z888 Allergy status to other drugs, medicaments and biological substances status: Secondary | ICD-10-CM

## 2022-01-14 DIAGNOSIS — E1122 Type 2 diabetes mellitus with diabetic chronic kidney disease: Secondary | ICD-10-CM | POA: Diagnosis present

## 2022-01-14 DIAGNOSIS — R042 Hemoptysis: Secondary | ICD-10-CM | POA: Diagnosis present

## 2022-01-14 DIAGNOSIS — I2510 Atherosclerotic heart disease of native coronary artery without angina pectoris: Secondary | ICD-10-CM | POA: Diagnosis present

## 2022-01-14 DIAGNOSIS — E119 Type 2 diabetes mellitus without complications: Secondary | ICD-10-CM

## 2022-01-14 DIAGNOSIS — I499 Cardiac arrhythmia, unspecified: Secondary | ICD-10-CM | POA: Diagnosis not present

## 2022-01-14 DIAGNOSIS — J432 Centrilobular emphysema: Secondary | ICD-10-CM | POA: Diagnosis present

## 2022-01-14 DIAGNOSIS — I10 Essential (primary) hypertension: Secondary | ICD-10-CM

## 2022-01-14 DIAGNOSIS — Z8673 Personal history of transient ischemic attack (TIA), and cerebral infarction without residual deficits: Secondary | ICD-10-CM

## 2022-01-14 DIAGNOSIS — Z7982 Long term (current) use of aspirin: Secondary | ICD-10-CM

## 2022-01-14 DIAGNOSIS — G4700 Insomnia, unspecified: Secondary | ICD-10-CM | POA: Diagnosis present

## 2022-01-14 DIAGNOSIS — R58 Hemorrhage, not elsewhere classified: Secondary | ICD-10-CM | POA: Diagnosis not present

## 2022-01-14 DIAGNOSIS — J9601 Acute respiratory failure with hypoxia: Secondary | ICD-10-CM | POA: Diagnosis present

## 2022-01-14 DIAGNOSIS — Z7901 Long term (current) use of anticoagulants: Secondary | ICD-10-CM

## 2022-01-14 DIAGNOSIS — G250 Essential tremor: Secondary | ICD-10-CM

## 2022-01-14 DIAGNOSIS — Z9841 Cataract extraction status, right eye: Secondary | ICD-10-CM

## 2022-01-14 DIAGNOSIS — R6889 Other general symptoms and signs: Secondary | ICD-10-CM | POA: Diagnosis not present

## 2022-01-14 DIAGNOSIS — R918 Other nonspecific abnormal finding of lung field: Secondary | ICD-10-CM

## 2022-01-14 DIAGNOSIS — Z20822 Contact with and (suspected) exposure to covid-19: Secondary | ICD-10-CM | POA: Diagnosis present

## 2022-01-14 DIAGNOSIS — R079 Chest pain, unspecified: Secondary | ICD-10-CM

## 2022-01-14 DIAGNOSIS — I129 Hypertensive chronic kidney disease with stage 1 through stage 4 chronic kidney disease, or unspecified chronic kidney disease: Secondary | ICD-10-CM | POA: Diagnosis present

## 2022-01-14 DIAGNOSIS — Z86010 Personal history of colonic polyps: Secondary | ICD-10-CM

## 2022-01-14 DIAGNOSIS — I4510 Unspecified right bundle-branch block: Secondary | ICD-10-CM | POA: Diagnosis not present

## 2022-01-14 DIAGNOSIS — I248 Other forms of acute ischemic heart disease: Secondary | ICD-10-CM | POA: Diagnosis present

## 2022-01-14 DIAGNOSIS — J309 Allergic rhinitis, unspecified: Secondary | ICD-10-CM | POA: Diagnosis present

## 2022-01-14 DIAGNOSIS — C3490 Malignant neoplasm of unspecified part of unspecified bronchus or lung: Secondary | ICD-10-CM | POA: Diagnosis present

## 2022-01-14 DIAGNOSIS — Z801 Family history of malignant neoplasm of trachea, bronchus and lung: Secondary | ICD-10-CM

## 2022-01-14 DIAGNOSIS — E876 Hypokalemia: Secondary | ICD-10-CM | POA: Diagnosis not present

## 2022-01-14 DIAGNOSIS — E1169 Type 2 diabetes mellitus with other specified complication: Secondary | ICD-10-CM

## 2022-01-14 DIAGNOSIS — Z87891 Personal history of nicotine dependence: Secondary | ICD-10-CM

## 2022-01-14 DIAGNOSIS — Z743 Need for continuous supervision: Secondary | ICD-10-CM | POA: Diagnosis not present

## 2022-01-14 DIAGNOSIS — I82401 Acute embolism and thrombosis of unspecified deep veins of right lower extremity: Secondary | ICD-10-CM | POA: Diagnosis present

## 2022-01-14 MED ORDER — SODIUM CHLORIDE 0.9 % IV BOLUS
1000.0000 mL | Freq: Once | INTRAVENOUS | Status: AC
  Administered 2022-01-15: 1000 mL via INTRAVENOUS

## 2022-01-14 NOTE — ED Triage Notes (Signed)
BIBA from home for shob with cough with bloody sputum. Diagnosed with lung ca 1 week ago. Biopsy performed yesterday. 79% RA, tachypnea, and ST on monitor. 4lpm Walker applied.  Hx HTN

## 2022-01-14 NOTE — ED Provider Notes (Signed)
[place] DEPT Provider Note  CSN: [id number] Arrival date & time: 01/14/22 2332  Chief Complaint(s) Shortness of Breath  HPI Drew Harrington is a 76 y.o. male with a past medical history listed below including recently diagnosed lung mass that was biopsied yesterday who presents to the emergency department for sudden onset worsening shortness of breath at 9 PM this evening.  This prompted a call to EMS who noted patient's oxygen saturations were in the 70s on room air.  Patient denied any associated chest pain.  He reports hemoptysis for the past several weeks.  No abdominal pain.  No nausea or vomiting.  No other physical complaints.  Since being placed on supplemental oxygen patient's shortness of breath has improved.  The history is provided by the patient.    Past Medical History 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[place])    Borderline diabetes    Cataract    bilateral lens implants   Diabetes mellitus without complication ([place])    HTN (hypertension)    Hyperlipidemia    Insomnia, unspecified    Perirectal abscess 05/10/2013   Personal history of colonic adenomas 04/10/2010   Psychosexual dysfunction with inhibited sexual excitement    Stroke [facility])    Subacute intracranial hemorrhage ([place])    Unspecified hemorrhoids without mention of complication    Patient Active Problem List   Diagnosis Date Noted   Lung mass 01/11/2022   Mass of upper lobe of left lung 01/06/2022   Memory change 12/25/2021   Cough 12/25/2021   Weakness of right side of body 12/25/2021   Shoulder pain 07/06/2019   Tremor, essential 02/27/2014   Routine health maintenance 01/11/2013   History of colonic polyps 04/10/2010   CATARACT, SENILE, BILATERAL 06/24/2009   Hyperlipidemia associated with type 2 diabetes mellitus ([place]) 11/17/2007   Diabetes mellitus type 2 with complications ([place]) 30/02/2329   IMPOTENCE 11/16/2007    Essential hypertension, malignant 11/16/2007   HEMORRHOIDS 11/16/2007   Home Medication(s) Prior to Admission medications   Medication Sig Start Date End Date Taking? Authorizing Provider  aspirin 81 MG EC tablet Take 81 mg by mouth daily.    [provider]  donepezil (ARICEPT) 5 MG tablet Take 5 mg by mouth at bedtime.    [provider]  losartan-hydrochlorothiazide (HYZAAR) 100-25 MG tablet TAKE 1 TABLET BY MOUTH DAILY 01/12/22   Hoyt Koch, MD  metFORMIN (GLUCOPHAGE) 1000 MG tablet Take 1 tablet (1,000 mg total) by mouth daily with breakfast. 11/11/20   Hoyt Koch, MD  metoprolol succinate (TOPROL-XL) 25 MG 24 hr tablet TAKE 1 TABLET(25 MG) BY MOUTH DAILY 09/21/21   Hoyt Koch, MD  potassium chloride SA (KLOR-CON) 20 MEQ tablet Take 1 tablet (20 mEq total) by mouth daily. 11/11/20   Hoyt Koch, MD  primidone (MYSOLINE) 250 MG tablet Take 250 mg by mouth See admin instructions. 2 tablets in AM and 3 tablets at bedtime 11/07/18   [provider]  simvastatin (ZOCOR) 20 MG tablet TAKE 1 TABLET(20 MG) BY MOUTH AT BEDTIME 07/02/21   Janith Lima, MD  Allergies Sildenafil  Review of Systems Review of Systems As noted in HPI  Physical Exam Vital Signs  I have reviewed the triage vital signs BP (!) 159/107   Pulse (!) 125   Resp (!) 23   Ht 5\' 9"  (1.753 m)   Wt 76 kg   SpO2 (!) 87%   BMI 24.74 kg/m   Physical Exam Vitals reviewed.  Constitutional:      General: He is not in acute distress.    Appearance: He is well-developed. He is not diaphoretic.  HENT:     Head: Normocephalic and atraumatic.     Nose: Nose normal.  Eyes:     General: No scleral icterus.       Right eye: No discharge.        Left eye: No discharge.     Conjunctiva/sclera: Conjunctivae normal.     Pupils: Pupils  are equal, round, and reactive to light.  Cardiovascular:     Rate and Rhythm: Regular rhythm. Tachycardia present.     Heart sounds: No murmur heard.    No friction rub. No gallop.  Pulmonary:     Effort: Pulmonary effort is normal. Tachypnea present. No respiratory distress.     Breath sounds: Normal breath sounds. No stridor. No wheezing, rhonchi or rales.  Abdominal:     General: There is no distension.     Palpations: Abdomen is soft.     Tenderness: There is no abdominal tenderness.  Musculoskeletal:        General: No tenderness.     Cervical back: Normal range of motion and neck supple.     Right lower leg: No edema.     Left lower leg: No edema.  Skin:    General: Skin is warm and dry.     Findings: No erythema or rash.  Neurological:     Mental Status: He is alert and oriented to person, place, and time.     ED Results and Treatments Labs (all labs ordered are listed, but only abnormal results are displayed) Labs Reviewed  COMPREHENSIVE METABOLIC PANEL - Abnormal; Notable for the following components:      Result Value   Glucose, Bld 238 (*)    Creatinine, Ser 1.35 (*)    Albumin 3.4 (*)    Alkaline Phosphatase 182 (*)    GFR, Estimated 54 (*)    All other components within normal limits  CBC WITH DIFFERENTIAL/PLATELET - Abnormal; Notable for the following components:   Neutro Abs 7.8 (*)    All other components within normal limits  I-STAT CHEM 8, ED - Abnormal; Notable for the following components:   Chloride 97 (*)    Creatinine, Ser 1.30 (*)    Glucose, Bld 234 (*)    Calcium, Ion 1.12 (*)    All other components within normal limits  RESP PANEL BY RT-PCR (FLU A&B, COVID) ARPGX2  EKG  EKG Interpretation  Date/Time:  Friday January 15 2022 01:28:09 EDT Ventricular Rate:  120 PR Interval:  155 QRS Duration: 150 QT Interval:  331 QTC  Calculation: 468 R Axis:   -80 Text Interpretation: Sinus tachycardia Consider right atrial enlargement Right bundle branch block Inferior infarct, old Confirmed by Addison Lank [id number]) on 01/15/2022 2:50:46 AM       Radiology CT Angio Chest PE W and/or Wo Contrast  Result Date: 01/15/2022 CLINICAL DATA:  Pulmonary embolus suspected with high probability. Productive cough, low oxygen saturation, recent diagnosis of lung cancer. EXAM: CT ANGIOGRAPHY CHEST WITH CONTRAST TECHNIQUE: Multidetector CT imaging of the chest was performed using the standard protocol during bolus administration of intravenous contrast. Multiplanar CT image reconstructions and MIPs were obtained to evaluate the vascular anatomy. RADIATION DOSE REDUCTION: This exam was performed according to the departmental dose-optimization program which includes automated exposure control, adjustment of the mA and/or kV according to patient size and/or use of iterative reconstruction technique. CONTRAST:  95mL OMNIPAQUE IOHEXOL 350 MG/ML SOLN COMPARISON:  12/30/2021 FINDINGS: Cardiovascular: Large filling defects are demonstrated in the right main pulmonary artery and extending into upper and lower lobe branches consistent with acute pulmonary embolus. Finding is new since prior study. Cardiac enlargement. No pericardial effusion. Elevated RV to LV ratio at 1.5. Normal caliber thoracic aorta. There is eccentric thrombus in the aortic arch and descending aorta, similar to prior study. Mediastinum/Nodes: Esophagus is decompressed. Lymphadenopathy in the left hilum and left paratracheal region, similar to prior study. Thyroid gland is unremarkable. Lungs/Pleura: Large left apical mass extending to the left hilum and surrounding the left subclavian artery with partially surrounding the left carotid artery origin. Mass measures up to about 7.2 x 8 cm in diameter. This is likely to represent primary lung cancer. There is atelectasis or consolidation in  the left upper lung and left lower lung. Moderate left pleural effusion. Patchy peripheral wedge-shaped opacities are demonstrated in the right upper lung, new since prior study, possibly peripheral infarcts. Upper Abdomen: Large left adrenal gland nodule measuring 3.8 cm diameter, unchanged since prior study and likely metastatic. Circumscribed low-attenuation lesion centrally in the liver measuring 3.6 cm diameter is unchanged since prior study, likely representing a benign cyst. Musculoskeletal: No chest wall abnormality. No acute or significant osseous findings. Review of the MIP images confirms the above findings. IMPRESSION: 1. Positive examination for pulmonary embolus with large right main and multiple peripheral pulmonary emboli. Elevated RV to LV ratio at 1.5 suggesting right heart strain. Positive for acute PE with CT evidence of right heart strain (RV/LV Ratio = 1.5) consistent with at least submassive (intermediate risk) PE. The presence of right heart strain has been associated with an increased risk of morbidity and mortality. Please refer to the "Code PE Focused" order set in EPIC. 2. Again demonstrated is a large left apical/hilar/mediastinal mass lesion likely representing primary lung cancer with lymphadenopathy and direct mediastinal invasion. 3.8 cm left adrenal gland nodule most likely represents a metastasis. 3. Moderate left pleural effusion with atelectasis or infiltration in the left lung. 4. Probable small peripheral infarcts in the right lung. Critical Value/emergent results were called by telephone at the time of interpretation on 01/15/2022 at 1:25 am to provider Tegler, who verbally acknowledged these results. Electronically Signed   By: Lucienne Capers M.D.   On: 01/15/2022 01:37   DG Chest Port 1 View  Result Date: 01/15/2022 CLINICAL DATA:  Shortness of breath, cough with bloody sputum. Recent diagnosis of  lung cancer with biopsy. EXAM: PORTABLE CHEST 1 VIEW COMPARISON:   12/25/2021. FINDINGS: The heart size and mediastinal contours are stable. A consolidative opacity is noted in the left upper lobe at the paramediastinal border and is unchanged from the prior exam. Mild atelectasis is present at the left lung base. The right lung is clear. No effusion or pneumothorax. No acute osseous abnormality. IMPRESSION: 1. Stable mass in the left upper lobe along the paramediastinal border. 2. Mild atelectasis at the left lung base. Electronically Signed   By: Brett Fairy M.D.   On: 01/15/2022 00:12    Pertinent labs & imaging results that were available during my care of the patient were reviewed by me and considered in my medical decision making (see MDM for details).  Medications Ordered in ED Medications  sodium chloride (PF) 0.9 % injection (  Not Given 01/15/22 0125)  heparin ADULT infusion 100 units/mL (25000 units/281mL) (1,300 Units/hr Intravenous New Bag/Given 01/15/22 0222)  sodium chloride 0.9 % bolus 1,000 mL (0 mLs Intravenous Stopped 01/15/22 0221)  iohexol (OMNIPAQUE) 350 MG/ML injection 100 mL (80 mLs Intravenous Contrast Given 01/15/22 0109)  heparin bolus via infusion 2,500 Units (2,500 Units Intravenous Bolus from Bag 01/15/22 0224)                                                                                                                                     Procedures .Critical Care  Performed by: Fatima Blank, MD Authorized by: Fatima Blank, MD   Critical care provider statement:    Critical care time (minutes):  45   Critical care time was exclusive of:  Separately billable procedures and treating other patients   Critical care was necessary to treat or prevent imminent or life-threatening deterioration of the following conditions:  Respiratory failure and circulatory failure   Critical care was time spent personally by me on the following activities:  Development of treatment plan with patient or surrogate, discussions with  consultants, evaluation of patient's response to treatment, examination of patient, obtaining history from patient or surrogate, review of old charts, re-evaluation of patient's condition, pulse oximetry, ordering and review of radiographic studies, ordering and review of laboratory studies and ordering and performing treatments and interventions   Care discussed with: admitting provider     (including critical care time)  Medical Decision Making / ED Course    Complexity of Problem:  Co-morbidities/SDOH that complicate the patient evaluation/care: Noted above in HPI  Patient's presenting problem/concern, DDX, and MDM listed below: Sudden onset shortness of breath Most concerning for pulmonary embolism. We will also need to assess for any postprocedural complication (including pneumothorax) or infection  Hospitalization Considered:  yes  Initial Intervention:  NRB, IVF    Complexity of Data:   Cardiac Monitoring: The patient was maintained on a cardiac monitor.   I personally viewed and interpreted the cardiac monitored which showed an underlying rhythm  of sinus tachycardia EKG with sinus tachycardia new right bundle branch block  Laboratory Tests ordered listed below with my independent interpretation: CBC without leukocytosis or anemia Metabolic panel without significant electrolyte derangements.  Hyperglycemia without DKA.  Mild renal insufficiency. COVID/influenza negative   Imaging Studies ordered listed below with my independent interpretation: Chest x-ray without evidence of pneumonia or pneumothorax CT PE study is positive for large right PE with multiple peripheral PEs.   Radiology Confirmed and reported that there is evidence of right heart strain.     ED Course:    Assessment, Add'l Intervention, and Reassessment: Shortness of breath Work-up confirmed PE Patient is otherwise hemodynamically stable Requiring nonrebreather Started on heparin drip Case  discussed with Dr. Alcario Drought from hospitalist service who will admit patient for further work-up and management    Final Clinical Impression(s) / ED Diagnoses Final diagnoses:  Pulmonary embolism on right [facility])           This chart was dictated using voice recognition software.  Despite best efforts to proofread,  errors can occur which can change the documentation meaning.    Fatima Blank, MD 01/15/22 [phone]

## 2022-01-14 NOTE — ED Notes (Signed)
Oxygen increased to 6lpm [place] MD at bedside at this time

## 2022-01-15 ENCOUNTER — Inpatient Hospital Stay (HOSPITAL_COMMUNITY): Payer: Medicare Other

## 2022-01-15 ENCOUNTER — Encounter (HOSPITAL_COMMUNITY): Payer: Self-pay

## 2022-01-15 ENCOUNTER — Emergency Department (HOSPITAL_COMMUNITY): Payer: Medicare Other

## 2022-01-15 ENCOUNTER — Ambulatory Visit (HOSPITAL_COMMUNITY): Admission: RE | Admit: 2022-01-15 | Payer: Medicare Other | Source: Ambulatory Visit

## 2022-01-15 DIAGNOSIS — I10 Essential (primary) hypertension: Secondary | ICD-10-CM | POA: Diagnosis not present

## 2022-01-15 DIAGNOSIS — I82411 Acute embolism and thrombosis of right femoral vein: Secondary | ICD-10-CM | POA: Diagnosis present

## 2022-01-15 DIAGNOSIS — J9811 Atelectasis: Secondary | ICD-10-CM | POA: Diagnosis not present

## 2022-01-15 DIAGNOSIS — I2699 Other pulmonary embolism without acute cor pulmonale: Secondary | ICD-10-CM

## 2022-01-15 DIAGNOSIS — M6281 Muscle weakness (generalized): Secondary | ICD-10-CM | POA: Diagnosis not present

## 2022-01-15 DIAGNOSIS — I2510 Atherosclerotic heart disease of native coronary artery without angina pectoris: Secondary | ICD-10-CM | POA: Diagnosis present

## 2022-01-15 DIAGNOSIS — J9601 Acute respiratory failure with hypoxia: Secondary | ICD-10-CM | POA: Diagnosis present

## 2022-01-15 DIAGNOSIS — J432 Centrilobular emphysema: Secondary | ICD-10-CM | POA: Diagnosis present

## 2022-01-15 DIAGNOSIS — I2609 Other pulmonary embolism with acute cor pulmonale: Secondary | ICD-10-CM | POA: Diagnosis present

## 2022-01-15 DIAGNOSIS — R059 Cough, unspecified: Secondary | ICD-10-CM | POA: Diagnosis not present

## 2022-01-15 DIAGNOSIS — Z20822 Contact with and (suspected) exposure to covid-19: Secondary | ICD-10-CM | POA: Diagnosis present

## 2022-01-15 DIAGNOSIS — Z9841 Cataract extraction status, right eye: Secondary | ICD-10-CM | POA: Diagnosis not present

## 2022-01-15 DIAGNOSIS — R042 Hemoptysis: Secondary | ICD-10-CM

## 2022-01-15 DIAGNOSIS — C3490 Malignant neoplasm of unspecified part of unspecified bronchus or lung: Secondary | ICD-10-CM | POA: Diagnosis present

## 2022-01-15 DIAGNOSIS — J90 Pleural effusion, not elsewhere classified: Secondary | ICD-10-CM | POA: Diagnosis not present

## 2022-01-15 DIAGNOSIS — E1122 Type 2 diabetes mellitus with diabetic chronic kidney disease: Secondary | ICD-10-CM | POA: Diagnosis present

## 2022-01-15 DIAGNOSIS — I700 Atherosclerosis of aorta: Secondary | ICD-10-CM | POA: Diagnosis present

## 2022-01-15 DIAGNOSIS — G4700 Insomnia, unspecified: Secondary | ICD-10-CM | POA: Diagnosis present

## 2022-01-15 DIAGNOSIS — Z8249 Family history of ischemic heart disease and other diseases of the circulatory system: Secondary | ICD-10-CM | POA: Diagnosis not present

## 2022-01-15 DIAGNOSIS — R079 Chest pain, unspecified: Secondary | ICD-10-CM

## 2022-01-15 DIAGNOSIS — I129 Hypertensive chronic kidney disease with stage 1 through stage 4 chronic kidney disease, or unspecified chronic kidney disease: Secondary | ICD-10-CM | POA: Diagnosis present

## 2022-01-15 DIAGNOSIS — Z8673 Personal history of transient ischemic attack (TIA), and cerebral infarction without residual deficits: Secondary | ICD-10-CM | POA: Diagnosis not present

## 2022-01-15 DIAGNOSIS — R918 Other nonspecific abnormal finding of lung field: Secondary | ICD-10-CM | POA: Diagnosis not present

## 2022-01-15 DIAGNOSIS — Z961 Presence of intraocular lens: Secondary | ICD-10-CM | POA: Diagnosis present

## 2022-01-15 DIAGNOSIS — J309 Allergic rhinitis, unspecified: Secondary | ICD-10-CM | POA: Diagnosis present

## 2022-01-15 DIAGNOSIS — Z66 Do not resuscitate: Secondary | ICD-10-CM | POA: Diagnosis not present

## 2022-01-15 DIAGNOSIS — J910 Malignant pleural effusion: Secondary | ICD-10-CM | POA: Diagnosis present

## 2022-01-15 DIAGNOSIS — N1831 Chronic kidney disease, stage 3a: Secondary | ICD-10-CM

## 2022-01-15 DIAGNOSIS — E785 Hyperlipidemia, unspecified: Secondary | ICD-10-CM | POA: Diagnosis present

## 2022-01-15 DIAGNOSIS — I2782 Chronic pulmonary embolism: Secondary | ICD-10-CM | POA: Diagnosis present

## 2022-01-15 DIAGNOSIS — Z7901 Long term (current) use of anticoagulants: Secondary | ICD-10-CM | POA: Diagnosis not present

## 2022-01-15 DIAGNOSIS — R0602 Shortness of breath: Secondary | ICD-10-CM | POA: Diagnosis not present

## 2022-01-15 DIAGNOSIS — I248 Other forms of acute ischemic heart disease: Secondary | ICD-10-CM | POA: Diagnosis present

## 2022-01-15 DIAGNOSIS — G9389 Other specified disorders of brain: Secondary | ICD-10-CM | POA: Diagnosis not present

## 2022-01-15 LAB — I-STAT CHEM 8, ED
BUN: 12 mg/dL (ref 8–23)
Calcium, Ion: 1.12 mmol/L — ABNORMAL LOW (ref 1.15–1.40)
Chloride: 97 mmol/L — ABNORMAL LOW (ref 98–111)
Creatinine, Ser: 1.3 mg/dL — ABNORMAL HIGH (ref 0.61–1.24)
Glucose, Bld: 234 mg/dL — ABNORMAL HIGH (ref 70–99)
HCT: 46 % (ref 39.0–52.0)
Hemoglobin: 15.6 g/dL (ref 13.0–17.0)
Potassium: 3.5 mmol/L (ref 3.5–5.1)
Sodium: 138 mmol/L (ref 135–145)
TCO2: 27 mmol/L (ref 22–32)

## 2022-01-15 LAB — HEPATIC FUNCTION PANEL
ALT: 25 U/L (ref 0–44)
AST: 30 U/L (ref 15–41)
Albumin: 3.1 g/dL — ABNORMAL LOW (ref 3.5–5.0)
Alkaline Phosphatase: 163 U/L — ABNORMAL HIGH (ref 38–126)
Bilirubin, Direct: 0.1 mg/dL (ref 0.0–0.2)
Indirect Bilirubin: 0.1 mg/dL — ABNORMAL LOW (ref 0.3–0.9)
Total Bilirubin: 0.2 mg/dL — ABNORMAL LOW (ref 0.3–1.2)
Total Protein: 7 g/dL (ref 6.5–8.1)

## 2022-01-15 LAB — ECHOCARDIOGRAM COMPLETE
AR max vel: 2.87 cm2
AV Peak grad: 3.3 mmHg
Ao pk vel: 0.92 m/s
Area-P 1/2: 10.54 cm2
Calc EF: 57.8 %
Height: 69 in
S' Lateral: 2.3 cm
Single Plane A2C EF: 62.7 %
Single Plane A4C EF: 58.5 %
Weight: 2680.79 oz

## 2022-01-15 LAB — MRSA NEXT GEN BY PCR, NASAL: MRSA by PCR Next Gen: NOT DETECTED

## 2022-01-15 LAB — CBC WITH DIFFERENTIAL/PLATELET
Abs Immature Granulocytes: 0.04 10*3/uL (ref 0.00–0.07)
Basophils Absolute: 0.1 10*3/uL (ref 0.0–0.1)
Basophils Relative: 1 %
Eosinophils Absolute: 0.2 10*3/uL (ref 0.0–0.5)
Eosinophils Relative: 2 %
HCT: 45.9 % (ref 39.0–52.0)
Hemoglobin: 15.5 g/dL (ref 13.0–17.0)
Immature Granulocytes: 0 %
Lymphocytes Relative: 9 %
Lymphs Abs: 0.9 10*3/uL (ref 0.7–4.0)
MCH: 30.8 pg (ref 26.0–34.0)
MCHC: 33.8 g/dL (ref 30.0–36.0)
MCV: 91.1 fL (ref 80.0–100.0)
Monocytes Absolute: 0.6 10*3/uL (ref 0.1–1.0)
Monocytes Relative: 7 %
Neutro Abs: 7.8 10*3/uL — ABNORMAL HIGH (ref 1.7–7.7)
Neutrophils Relative %: 81 %
Platelets: 186 10*3/uL (ref 150–400)
RBC: 5.04 MIL/uL (ref 4.22–5.81)
RDW: 13 % (ref 11.5–15.5)
WBC: 9.7 10*3/uL (ref 4.0–10.5)
nRBC: 0 % (ref 0.0–0.2)

## 2022-01-15 LAB — COMPREHENSIVE METABOLIC PANEL
ALT: 29 U/L (ref 0–44)
AST: 35 U/L (ref 15–41)
Albumin: 3.4 g/dL — ABNORMAL LOW (ref 3.5–5.0)
Alkaline Phosphatase: 182 U/L — ABNORMAL HIGH (ref 38–126)
Anion gap: 14 (ref 5–15)
BUN: 14 mg/dL (ref 8–23)
CO2: 28 mmol/L (ref 22–32)
Calcium: 9.4 mg/dL (ref 8.9–10.3)
Chloride: 98 mmol/L (ref 98–111)
Creatinine, Ser: 1.35 mg/dL — ABNORMAL HIGH (ref 0.61–1.24)
GFR, Estimated: 54 mL/min — ABNORMAL LOW (ref >60)
Glucose, Bld: 238 mg/dL — ABNORMAL HIGH (ref 70–99)
Potassium: 3.6 mmol/L (ref 3.5–5.1)
Sodium: 140 mmol/L (ref 135–145)
Total Bilirubin: 0.7 mg/dL (ref 0.3–1.2)
Total Protein: 7.6 g/dL (ref 6.5–8.1)

## 2022-01-15 LAB — GLUCOSE, CAPILLARY
Glucose-Capillary: 121 mg/dL — ABNORMAL HIGH (ref 70–99)
Glucose-Capillary: 126 mg/dL — ABNORMAL HIGH (ref 70–99)
Glucose-Capillary: 105 mg/dL — ABNORMAL HIGH (ref 70–99)

## 2022-01-15 LAB — BRAIN NATRIURETIC PEPTIDE
B Natriuretic Peptide: 359.6 pg/mL — ABNORMAL HIGH (ref 0.0–100.0)
B Natriuretic Peptide: 28.3 pg/mL (ref 0.0–100.0)
B Natriuretic Peptide: 409.2 pg/mL — ABNORMAL HIGH (ref 0.0–100.0)

## 2022-01-15 LAB — HEPARIN LEVEL (UNFRACTIONATED)
Heparin Unfractionated: 0.4 IU/mL (ref 0.30–0.70)
Heparin Unfractionated: 0.5 IU/mL (ref 0.30–0.70)

## 2022-01-15 LAB — LACTIC ACID, PLASMA
Lactic Acid, Venous: 1.5 mmol/L (ref 0.5–1.9)
Lactic Acid, Venous: 1.3 mmol/L (ref 0.5–1.9)

## 2022-01-15 LAB — RESP PANEL BY RT-PCR (FLU A&B, COVID) ARPGX2
Influenza A by PCR: NEGATIVE
Influenza B by PCR: NEGATIVE
SARS Coronavirus 2 by RT PCR: NEGATIVE

## 2022-01-15 LAB — CYTOLOGY - NON PAP

## 2022-01-15 LAB — CBG MONITORING, ED: Glucose-Capillary: 121 mg/dL — ABNORMAL HIGH (ref 70–99)

## 2022-01-15 LAB — TROPONIN I (HIGH SENSITIVITY)
Troponin I (High Sensitivity): 859 ng/L (ref <18)
Troponin I (High Sensitivity): 335 ng/L (ref <18)

## 2022-01-15 MED ORDER — ORAL CARE MOUTH RINSE: 15.0000 mL | OROMUCOSAL | Status: DC | PRN

## 2022-01-15 MED ORDER — HEPARIN BOLUS VIA INFUSION
2500.0000 [IU] | Freq: Once | INTRAVENOUS | Status: AC
  Administered 2022-01-15: 2500 [IU] via INTRAVENOUS
  Filled 2022-01-15: qty 2500

## 2022-01-15 MED ORDER — PRIMIDONE 250 MG PO TABS
750.0000 mg | ORAL_TABLET | Freq: Every day | ORAL | Status: DC
  Administered 2022-01-15 – 2022-01-19 (×5): 750 mg via ORAL
  Filled 2022-01-15 (×7): qty 3

## 2022-01-15 MED ORDER — INSULIN ASPART 100 UNIT/ML IJ SOLN
0.0000 [IU] | Freq: Three times a day (TID) | INTRAMUSCULAR | Status: DC
  Administered 2022-01-15 (×3): 1 [IU] via SUBCUTANEOUS
  Administered 2022-01-17 (×2): 2 [IU] via SUBCUTANEOUS
  Filled 2022-01-15: qty 0.09

## 2022-01-15 MED ORDER — ORAL CARE MOUTH RINSE
15.0000 mL | OROMUCOSAL | Status: DC
  Administered 2022-01-15 – 2022-01-20 (×17): 15 mL via OROMUCOSAL

## 2022-01-15 MED ORDER — SODIUM CHLORIDE (PF) 0.9 % IJ SOLN
INTRAMUSCULAR | Status: AC
  Filled 2022-01-15: qty 50

## 2022-01-15 MED ORDER — IOHEXOL 350 MG/ML SOLN
100.0000 mL | Freq: Once | INTRAVENOUS | Status: AC | PRN
  Administered 2022-01-15: 80 mL via INTRAVENOUS

## 2022-01-15 MED ORDER — HEPARIN (PORCINE) 25000 UT/250ML-% IV SOLN
1300.0000 [IU]/h | INTRAVENOUS | Status: AC
  Administered 2022-01-15 (×2): 1300 [IU]/h via INTRAVENOUS
  Filled 2022-01-15 (×2): qty 250

## 2022-01-15 MED ORDER — CHLORHEXIDINE GLUCONATE CLOTH 2 % EX PADS
6.0000 | MEDICATED_PAD | Freq: Every day | CUTANEOUS | Status: DC
  Administered 2022-01-15 – 2022-01-17 (×3): 6 via TOPICAL

## 2022-01-15 MED ORDER — DONEPEZIL HCL 5 MG PO TABS
5.0000 mg | ORAL_TABLET | Freq: Every day | ORAL | Status: DC
Start: 2022-01-15 — End: 2022-01-20
  Administered 2022-01-15 – 2022-01-19 (×5): 5 mg via ORAL
  Filled 2022-01-15 (×5): qty 1

## 2022-01-15 MED ORDER — PRIMIDONE 250 MG PO TABS
500.0000 mg | ORAL_TABLET | Freq: Every day | ORAL | Status: DC
  Administered 2022-01-15 – 2022-01-20 (×6): 500 mg via ORAL
  Filled 2022-01-15 (×8): qty 2

## 2022-01-15 MED ORDER — SIMVASTATIN 20 MG PO TABS
20.0000 mg | ORAL_TABLET | Freq: Every day | ORAL | Status: DC
Start: 2022-01-15 — End: 2022-01-20
  Administered 2022-01-15 – 2022-01-19 (×5): 20 mg via ORAL
  Filled 2022-01-15: qty 2
  Filled 2022-01-15: qty 1
  Filled 2022-01-15: qty 2
  Filled 2022-01-15 (×2): qty 1

## 2022-01-15 MED ORDER — PRIMIDONE 250 MG PO TABS: 250.0000 mg | ORAL_TABLET | ORAL | Status: DC

## 2022-01-15 NOTE — ED Notes (Signed)
Pt to CT scan.

## 2022-01-15 NOTE — Consult Note (Addendum)
NAME:  Drew Harrington, MRN:  [id number], DOB:  06-02-1946, LOS: 0 ADMISSION DATE:  01/14/2022, CONSULTATION DATE:  01/15/22 REFERRING MD:  Dr. Marylyn Ishihara, CHIEF COMPLAINT:  SOB   History of Present Illness:  76 year old male with history of HTN, HLD, DMT2, previous bladder cancer, CVA and ICH who presented 7/20 with acute onset of shortness of breath and chest pain with some associated dizziness.  He also reports some hemoptysis which started over a 1.5 weeks ago.  Reports occasional round dark red blood clots he coughs up, last one yesterday.  The amount, color, and frequency has not changed since it started.  Denies any recent fever, chills, lower leg pain or swelling, or syncope.  He is not on home oxygen.   Patient recently found on 12/30/21 with large necrotic left upper lobe mass with mediastinal and left supraclavicular lymphadenopathy and suspicion for left adrenal metastasis after complaints of dry cough, exertional shortness of breath, and recent 20lb weight loss.  He was seen by Dr. Julien Nordmann in office 7/12 with plans to refer to radiation oncology for palliative radiation given left apical lung mass causing thoracic inlet invasion and well as continued workup/ staging; he has not had his MRI brain or PET thus far.  He underwent ENB with Dr. Valeta Harms on 7/19, cytology consistent with non-small cell carcinoma.    In ER, he has been afebrile, tachycardic, tachypneic, RA sat 79% requiring NRB, and has been normo to hypertensive at times.  CTA PE positive for large right main and multiple peripheral pulmonary embolus with probable small peripheral infarcts with RV/LV ratio 1.5, and large left apical/ hilar/ mediastinal mass with lymphadenopathy and mediastinal invasion noted again, moderate left pleural effusion.  Of note, prior CT chest with contrast on 7/5 showed a peripheral filling defect at the bifurcation of the RUL and interlobar arteries indicative of chronic pulmonary embolus.  BNP 359 and Hgb  unchanged at 15.5.  He was placed on heparin gtt and admitted to [facility].  Pulmonary consulted for further recommendations.   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    Objective   Blood pressure (!) 152/102, pulse 100, temperature 98 F (36.7 C), temperature source Oral, resp. rate 18, height 5\' 9"  (1.753 m), weight 76 kg, SpO2 96 %.    FiO2 (%):  [36 %] 36 %   Intake/Output Summary (Last 24 hours) at 01/15/2022 1046 Last data filed at 01/15/2022 0221 Gross per 24 hour  Intake 1000 ml  Output --  Net 1000 ml   Filed Weights   01/14/22 2342  Weight: 76 kg   Examination: General:  pleasant elderly male lying in bed in NAD HEENT: MM pink/moist, edentulous  Neuro: Alert, oriented x3, MAE CV: rr, NSR, no murmur PULM:  non labored, speaking full sentences, clear on right, coarse left upper and scattered rales left base/ diminished GI: soft, bs+, ND Extremities: warm/dry, no obvious LE edema/ warmth/ erythema/ tenderness Skin: no rashes  Left pleural effusion     Resolved Hospital Problem list    Assessment & Plan:   Submassive R PE with evidence of right heart strain - PESI score 166- very high risk for adverse outcome -  likely precipitated in the setting of active malignancy - prior 7/5 CT chest w/contrast questioned chronic R PE - monitor closely in PCU.  Currently, in no distress and hemodynamically stable and able to rapidly wean down O2, currently on 4L  - heparin per pharmacy, would recommend at least .  Will need to monitor hemoptysis closely.  Discussed with attending, defer IR for now - echo pending - check lactate/ troponin/ LE dupplex - bed rest for now  Hemoptysis   - seems minimal by report.  Started 1.5 weeks ago and unchanged.  H/H wnl.  Likely multifactorial but need to monitor closely given he is now on heparin gtt - if it were to change, could consider TXA neb  Left apical lung mass causing thoracic inlet invasion, cytology c/w NSCLC - seen in office by Dr. Mckinley Jewel 7/12.  PET scan was scheduled for today.  Will have to be rescheduled as outpt.  Still need MRI brain as well. - Dr. Julien Nordmann notified of cytology and inpatient status - spoke with radiation oncologist, Dr. Isidore Moos of patient's current inpatient status and findings.  No indication at this time for emergent radiation> no evidence of airway or SVC collapse.  Patient had initial consult with Dr. Sondra Come on 7/26.  Dr. Isidore Moos will notify Dr. Sondra Come for further recommendations.   Left pleural effusion - presumed malignant given LUL mass cytology.  Monitor for now, no need for thoracentesis at this time, appears asymptomatic and small window as above on [place] evaluation.  Remainder per primary team.   Best Practice (right click and "Reselect all SmartList Selections" daily)  Per primary team   Labs   CBC: Recent Labs  Lab 01/14/22 2350 01/15/22 0013  WBC 9.7  --   NEUTROABS 7.8*  --   HGB 15.5 15.6  HCT 45.9 46.0  MCV 91.1  --   PLT 186  --     Basic Metabolic Panel: Recent Labs  Lab 01/14/22 2350 01/15/22 0013  NA 140 138  K 3.6 3.5  CL 98 97*  CO2 28  --   GLUCOSE 238* 234*  BUN 14 12  CREATININE 1.35* 1.30*  CALCIUM 9.4  --    GFR: Estimated Creatinine Clearance: 48.3 mL/min (A) (by C-G formula based on SCr of 1.3 mg/dL (H)). Recent Labs  Lab 01/14/22 2350  WBC 9.7    Liver Function Tests: Recent Labs  Lab 01/14/22 2350  AST 35  ALT 29  ALKPHOS 182*  BILITOT 0.7  PROT 7.6  ALBUMIN 3.4*   No results for input(s): "LIPASE", "AMYLASE" in the last 168 hours. No results for input(s): "AMMONIA" in the last 168 hours.  ABG    Component Value Date/Time   TCO2  27 01/15/2022 0013     Coagulation Profile: No results for input(s): "INR", "PROTIME" in the last 168 hours.  Cardiac Enzymes: No results for input(s): "CKTOTAL", "CKMB", "CKMBINDEX", "TROPONINI" in the last 168 hours.  HbA1C: Hemoglobin A1C  Date/Time Value Ref Range Status  11/12/2021 10:57 AM 6.3 (A) 4.0 - 5.6 % Final   Hgb A1c MFr Bld  Date/Time Value Ref Range Status  05/14/2021 09:10 AM 6.4 4.6 - 6.5 % Final    Comment:    Glycemic Control Guidelines for People with Diabetes:Non Diabetic:  <6%Goal of Therapy: <7%Additional Action Suggested:  >8%   11/13/2019 02:18 PM 5.9 4.6 - 6.5 % Final    Comment:    Glycemic Control Guidelines for People with Diabetes:Non Diabetic:  <6%Goal of  Therapy: <7%Additional Action Suggested:  >8%     CBG: Recent Labs  Lab 01/13/22 0653 01/15/22 0952  GLUCAP 150* 121*    Review of Systems:   Review of Systems  Constitutional:  Positive for weight loss. Negative for chills and fever.  HENT:  Negative for congestion and nosebleeds.   Respiratory:  Positive for cough, hemoptysis and shortness of breath. Negative for sputum production and wheezing.   Cardiovascular:  Positive for chest pain. Negative for palpitations, orthopnea and leg swelling.  Gastrointestinal:  Negative for abdominal pain, nausea and vomiting.  Neurological:  Negative for loss of consciousness.   Past Medical History:  He,  has a past medical history of Allergic rhinitis, Bladder cancer ([place]), Borderline diabetes, Cataract, Diabetes mellitus without complication ([place]), HTN (hypertension), Hyperlipidemia, Insomnia, unspecified, Perirectal abscess (05/10/2013), Personal history of colonic adenomas (04/10/2010), Psychosexual dysfunction with inhibited sexual excitement, Stroke [facility]), Subacute intracranial hemorrhage ([place]), and Unspecified hemorrhoids without mention of complication.   Surgical History:   Past Surgical History:  Procedure Laterality Date   BRONCHIAL  NEEDLE ASPIRATION BIOPSY  01/13/2022   Procedure: BRONCHIAL NEEDLE ASPIRATION BIOPSIES;  Surgeon: Garner Nash, DO;  Location: [place];  Service: Pulmonary;;   CATARACT EXTRACTION W/ INTRAOCULAR LENS  IMPLANT, BILATERAL     COLONOSCOPY     removal cyst hip Left 05/23/2013   TRANSTHORACIC ECHOCARDIOGRAM  04-06-2006   LVSF NORMAL/  EF 60%/ AORTIC ROOT AT UPPER LIMITS OF NORMAL SIZE   TRANSURETHRAL RESECTION OF BLADDER TUMOR  10/08/2011   Procedure: CIRCUMCISION AND TRANSURETHRAL RESECTION OF BLADDER TUMOR (TURBT);  Surgeon: [name], MD;  Location: [facility];  Service: Urology;  Laterality: N/A;   TRANSURETHRAL RESECTION OF BLADDER TUMOR  11/30/2011   Procedure: TRANSURETHRAL RESECTION OF BLADDER TUMOR (TURBT);  Surgeon: [name], MD;  Location: [facility];  Service: Urology;  Laterality: N/A;   VIDEO BRONCHOSCOPY WITH ENDOBRONCHIAL ULTRASOUND Left 01/13/2022   Procedure: VIDEO BRONCHOSCOPY WITH ENDOBRONCHIAL ULTRASOUND;  Surgeon: Garner Nash, DO;  Location: [place];  Service: Pulmonary;  Laterality: Left;     Social History:   reports that he has quit smoking. His smokeless tobacco use includes chew. He reports that he does not drink alcohol and does not use drugs.   Family History:  His family history includes Cancer in his father, sister, sister, and sister; Gout in an other family member; Heart disease in an other family member; Hypertension in an other family member; Lung disease in his father. There is no history of Colon cancer, Esophageal cancer, Stomach cancer, or Rectal cancer.   Allergies Allergies  Allergen Reactions   Sildenafil Other (See Comments)    Caused a headache     Home Medications  Prior to Admission medications   Medication Sig Start Date End Date Taking? Authorizing Provider  donepezil (ARICEPT) 5 MG tablet Take 5 mg by mouth at bedtime.   Yes [provider]   losartan-hydrochlorothiazide (HYZAAR) 100-25 MG tablet TAKE 1 TABLET BY MOUTH DAILY 01/12/22  Yes Hoyt Koch, MD  metFORMIN (GLUCOPHAGE) 1000 MG tablet Take 1 tablet (1,000 mg total) by mouth daily with breakfast. 11/11/20  Yes Hoyt Koch, MD  metoprolol succinate (TOPROL-XL) 25 MG 24 hr tablet TAKE 1 TABLET(25 MG) BY MOUTH DAILY Patient taking differently: Take 25 mg by mouth daily. 09/21/21  Yes Hoyt Koch, MD  potassium chloride SA (KLOR-CON) 20 MEQ tablet Take 1 tablet (20 mEq total) by mouth daily.  11/11/20  Yes Hoyt Koch, MD  primidone (MYSOLINE) 250 MG tablet Take 250 mg by mouth See admin instructions. 2 tablets in AM and 3 tablets at bedtime 11/07/18  Yes [provider]  simvastatin (ZOCOR) 20 MG tablet TAKE 1 TABLET(20 MG) BY MOUTH AT BEDTIME Patient taking differently: Take 20 mg by mouth at bedtime. 07/02/21  Yes Janith Lima, MD     Critical care time: n/a     Kennieth Rad, ACNP [facility] 01/15/2022, 10:46 AM  See Amion for pager If no response to pager, please call PCCM consult pager After 7:00 pm call Elink

## 2022-01-15 NOTE — Progress Notes (Signed)
Bilateral lower extremity venous duplex has been completed. Preliminary results can be found in CV Proc through chart review.  Results were given to Dr. Marylyn Ishihara.  01/15/22 1:52 PM Carlos Levering RVT

## 2022-01-15 NOTE — Progress Notes (Signed)
ANTICOAGULATION CONSULT NOTE  Pharmacy Consult for Heparin Indication: pulmonary embolus  Allergies  Allergen Reactions   Sildenafil Other (See Comments)    Caused a headache    Patient Measurements: Height: 5\' 9"  (175.3 cm) Weight: 72.5 kg (159 lb 13.3 oz) IBW/kg (Calculated) : 70.7 Heparin Dosing Weight: TBW  Vital Signs: Temp: 98 F (36.7 C) (07/21 1746) Temp Source: Oral (07/21 1746) BP: 165/97 (07/21 1800) Pulse Rate: 95 (07/21 1800)  Labs: Recent Labs    01/14/22 2350 01/15/22 0013 01/15/22 1106 01/15/22 1915  HGB 15.5 15.6  --   --   HCT 45.9 46.0  --   --   PLT 186  --   --   --   HEPARINUNFRC  --   --  0.40 0.50  CREATININE 1.35* 1.30*  --   --   TROPONINIHS  --   --  859*  --      Estimated Creatinine Clearance: 48.3 mL/min (A) (by C-G formula based on SCr of 1.3 mg/dL (H)).   Medical History: 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[place])    Borderline diabetes    Cataract    bilateral lens implants   Diabetes mellitus without complication (HCC)    HTN (hypertension)    Hyperlipidemia    Insomnia, unspecified    Perirectal abscess 05/10/2013   Personal history of colonic adenomas 04/10/2010   Psychosexual dysfunction with inhibited sexual excitement    Stroke ([place])    Subacute intracranial hemorrhage (HCC)    Unspecified hemorrhoids without mention of complication     Medications:  Infusions:   heparin 1,300 Units/hr (01/15/22 1959)    Assessment: 76 yo M with recently diagnosed lung CA s/p lung biopsy 7/19 presents with shortness of breath, cough and bloody sputum. Chest CT + PE with evidence of right heart strain.   Pharmacy consulted to dose IV heparin.  He is not on anticoagulation at home.  Baseline CBC WNL.    Today, 01/15/22 -Heparin level 0.50 - therapeutic on heparin infusion at 1300 units/hr -Hemoglobin remains stable - No line or bleeding issues per RN   Goal of Therapy:  Heparin  level 0.3-0.7 units/ml Monitor platelets by anticoagulation protocol: Yes   Plan:  -Continue heparin infusion at 1300 units/hr -Daily heparin level & CBC while on heparin -Continue to closely monitor for changes in hemoptysis. None this evening  -F/U long-term anticoagulation plans once stable   Royetta Asal, PharmD, BCPS 01/15/2022 8:48 PM

## 2022-01-15 NOTE — Progress Notes (Signed)
ANTICOAGULATION CONSULT NOTE - Initial Consult  Pharmacy Consult for Heparin Indication: pulmonary embolus  Allergies  Allergen Reactions   Sildenafil Other (See Comments)    Caused a headache    Patient Measurements: Height: 5\' 9"  (175.3 cm) Weight: 76 kg (167 lb 8.8 oz) IBW/kg (Calculated) : 70.7 Heparin Dosing Weight: TBW  Vital Signs: Temp: 98.2 F (36.8 C) (07/21 0015) BP: 153/104 (07/21 0145) Pulse Rate: 115 (07/21 0145)  Labs: Recent Labs    01/14/22 2350 01/15/22 0013  HGB 15.5 15.6  HCT 45.9 46.0  PLT 186  --   CREATININE 1.35* 1.30*    Estimated Creatinine Clearance: 48.3 mL/min (A) (by C-G formula based on SCr of 1.3 mg/dL (H)).   Medical History: 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[place])    Borderline diabetes    Cataract    bilateral lens implants   Diabetes mellitus without complication (HCC)    HTN (hypertension)    Hyperlipidemia    Insomnia, unspecified    Perirectal abscess 05/10/2013   Personal history of colonic adenomas 04/10/2010   Psychosexual dysfunction with inhibited sexual excitement    Stroke ([place])    Subacute intracranial hemorrhage (HCC)    Unspecified hemorrhoids without mention of complication     Medications:  Infusions:   Assessment: 76 yo M with recently diagnosed lung CA s/p lung biopsy 7/19 presents with shortness of breath, cough and bloody sputum. Chest CT + PE with evidence of right heart strain.   Pharmacy consulted to dose IV heparin.  He is not on anticoagulation at home.  Baseline CBC WNL.    Goal of Therapy:  Heparin level 0.3-0.7 units/ml Monitor platelets by anticoagulation protocol: Yes   Plan:  Heparin 2500 units IV bolus followed by continuous heparin infusion at 1300 units/hr Check heparin level 8h after infusion starts Daily heparin level & CBC while one heparin F/U long-term anticoagulation plans once stable.   Netta Cedars  PharmD 01/15/2022,2:10 AM

## 2022-01-15 NOTE — IPAL (Signed)
  Interdisciplinary Goals of Care Family Meeting   Date carried out: 01/15/2022  Location of the meeting: Bedside  Member's involved: Physician, Family Member or next of kin, and Other: NP [place] of Attorney or Loss adjuster, chartered: Patient and wife    Discussion: We discussed goals of care for Drew Harrington.  At this time patient is hemodynamically stable however in the acute event of decompensation, we discussed risks and benefits of lytic therapy and patient/wife do not want to pursue this option. If clinical status worsens patient/wife want IR consultation for catheter-directed lytics which may be preferred route in setting of hemoptysis and malignancy. We discussed goals of care and patient would not want aggressive measures such as CPR or mechanical life support. Code status changed to DNR  Code status: Full DNR  Disposition: Continue current acute care  Time spent for the meeting: 12 min    Drew Karner Rodman Pickle, MD  01/15/2022, 6:19 PM

## 2022-01-15 NOTE — H&P (Addendum)
History and Physical    Patient: Drew Harrington:[id number] DOB: April 30, 1946 DOA: 01/14/2022 DOS: the patient was seen and examined on 01/15/2022 PCP: Hoyt Koch, MD  Patient coming from: Home   Chief Complaint:  Chief Complaint  Patient presents with   Shortness of Breath   HPI: Drew Harrington is a 76 y.o. male with medical history significant of Dm2, HTN, HLD. Presenting with dyspnea and chest pain. His symptoms started at 2130hrs last night. He became acutely short of breath and had central chest pain - like someone was kicking his chest. He began coughing and noticed blood in his sputum. He became concerned and called for EMS. He has not had any sick contacts. He has not had any periods of long immobility. He has a previous history of bladder cancer, but completed treatment years ago. He had a recent bronchoscopy for a newly diagnosed lung mass. He denies any previous clots. He denies any other aggravating or alleviating factors.    Review of Systems: As mentioned in the history of present illness. All other systems reviewed and are negative. 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[place])    Borderline diabetes    Cataract    bilateral lens implants   Diabetes mellitus without complication (HCC)    HTN (hypertension)    Hyperlipidemia    Insomnia, unspecified    Perirectal abscess 05/10/2013   Personal history of colonic adenomas 04/10/2010   Psychosexual dysfunction with inhibited sexual excitement    Stroke ([place])    Subacute intracranial hemorrhage (HCC)    Unspecified hemorrhoids without mention of complication    Past Surgical History:  Procedure Laterality Date   BRONCHIAL NEEDLE ASPIRATION BIOPSY  01/13/2022   Procedure: BRONCHIAL NEEDLE ASPIRATION BIOPSIES;  Surgeon: Garner Nash, DO;  Location: [facility];  Service: Pulmonary;;   CATARACT EXTRACTION W/ INTRAOCULAR LENS  IMPLANT, BILATERAL     COLONOSCOPY      removal cyst hip Left 05/23/2013   TRANSTHORACIC ECHOCARDIOGRAM  04-06-2006   LVSF NORMAL/  EF 60%/ AORTIC ROOT AT UPPER LIMITS OF NORMAL SIZE   TRANSURETHRAL RESECTION OF BLADDER TUMOR  10/08/2011   Procedure: CIRCUMCISION AND TRANSURETHRAL RESECTION OF BLADDER TUMOR (TURBT);  Surgeon: [name], MD;  Location: [facility];  Service: Urology;  Laterality: N/A;   TRANSURETHRAL RESECTION OF BLADDER TUMOR  11/30/2011   Procedure: TRANSURETHRAL RESECTION OF BLADDER TUMOR (TURBT);  Surgeon: [name], MD;  Location: [facility];  Service: Urology;  Laterality: N/A;   VIDEO BRONCHOSCOPY WITH ENDOBRONCHIAL ULTRASOUND Left 01/13/2022   Procedure: VIDEO BRONCHOSCOPY WITH ENDOBRONCHIAL ULTRASOUND;  Surgeon: Garner Nash, DO;  Location: [place];  Service: Pulmonary;  Laterality: Left;   Social History:  reports that he has quit smoking. His smokeless tobacco use includes chew. He reports that he does not drink alcohol and does not use drugs.  Allergies  Allergen Reactions   Sildenafil Other (See Comments)    Caused a headache    Family History  Problem Relation Age of Onset   Lung disease Father    Cancer Father        lung   Cancer Sister        lung   Cancer Sister        lung   Cancer Sister        lung   Gout Other    Heart disease Other  Hypertension Other    Colon cancer Neg Hx    Esophageal cancer Neg Hx    Stomach cancer Neg Hx    Rectal cancer Neg Hx     Prior to Admission medications   Medication Sig Start Date End Date Taking? Authorizing Provider  donepezil (ARICEPT) 5 MG tablet Take 5 mg by mouth at bedtime.   Yes [provider]  losartan-hydrochlorothiazide (HYZAAR) 100-25 MG tablet TAKE 1 TABLET BY MOUTH DAILY 01/12/22  Yes Hoyt Koch, MD  metFORMIN (GLUCOPHAGE) 1000 MG tablet Take 1 tablet (1,000 mg total) by mouth daily with breakfast. 11/11/20  Yes Hoyt Koch, MD   metoprolol succinate (TOPROL-XL) 25 MG 24 hr tablet TAKE 1 TABLET(25 MG) BY MOUTH DAILY Patient taking differently: Take 25 mg by mouth daily. 09/21/21  Yes Hoyt Koch, MD  potassium chloride SA (KLOR-CON) 20 MEQ tablet Take 1 tablet (20 mEq total) by mouth daily. 11/11/20  Yes Hoyt Koch, MD  primidone (MYSOLINE) 250 MG tablet Take 250 mg by mouth See admin instructions. 2 tablets in AM and 3 tablets at bedtime 11/07/18  Yes [provider]  simvastatin (ZOCOR) 20 MG tablet TAKE 1 TABLET(20 MG) BY MOUTH AT BEDTIME Patient taking differently: Take 20 mg by mouth at bedtime. 07/02/21  Yes Janith Lima, MD    Physical Exam: Vitals:   01/15/22 0700 01/15/22 0715 01/15/22 0730 01/15/22 0745  BP: (!) 140/104 (!) 164/110 (!) 139/105 (!) 134/102  Pulse: (!) 104 (!) 108 (!) 102 (!) 102  Resp: (!) 0 (!) 26 (!) 25 (!) 21  Temp:      SpO2: 98% 98% 100% 100%  Weight:      Height:       General: 76 y.o. male resting in bed in NAD Eyes: PERRL, normal sclera ENMT: Nares patent w/o discharge, orophaynx clear, dentition normal, ears w/o discharge/lesions/ulcers Neck: Supple, trachea midline Cardiovascular: tachy, +S1, S2, no m/g/r, equal pulses throughout Respiratory: scattered rhonchi, increased WOB on NRB GI: BS+, NDNT, no masses noted, no organomegaly noted MSK: No e/c/c Neuro: A&O x 3, no focal deficits Psyc: Appropriate interaction and affect, calm/cooperative  Data Reviewed:  Lab Results  Component Value Date   NA 138 01/15/2022   K 3.5 01/15/2022   CO2 28 01/14/2022   GLUCOSE 234 (H) 01/15/2022   BUN 12 01/15/2022   CREATININE 1.30 (H) 01/15/2022   CALCIUM 9.4 01/14/2022   GFRNONAA 54 (L) 01/14/2022   BNP: 359.6  Lab Results  Component Value Date   WBC 9.7 01/14/2022   HGB 15.6 01/15/2022   HCT 46.0 01/15/2022   MCV 91.1 01/14/2022   PLT 186 01/14/2022   CTA chest PE 1. Positive examination for pulmonary embolus with large right main and  multiple peripheral pulmonary emboli. Elevated RV to LV ratio at 1.5 suggesting right heart strain. Positive for acute PE with CT evidence of right heart strain (RV/LV Ratio = 1.5) consistent with at least submassive (intermediate risk) PE. The presence of right heart strain has been associated with an increased risk of morbidity and mortality. Please refer to the "Code PE Focused" order set in EPIC. 2. Again demonstrated is a large left apical/hilar/mediastinal mass lesion likely representing primary lung cancer with lymphadenopathy and direct mediastinal invasion. 3.8 cm left adrenal gland nodule most likely represents a metastasis. 3. Moderate left pleural effusion with atelectasis or infiltration in the left lung. 4. Probable small peripheral infarcts in the right lung.  EKG: sinus tach,  RBBB  Assessment and Plan: PE w/ right heart strain RLE DVT Chest pain Hemoptysis     - admit to inpt, progressive     - likely secondary to recently noted lung mass     - imaging suggestive of right heart strain, he has an elevated BNP; echo ordered     - PCCM consulted; appreciate assistance     - continue heparin gtt     - O2 supplementation  LUL/Left Mediastinal Mass     - recent video bronchoscopy for biopsy (01/13/22)     - pathology pending     - continue follow up with pulm/onco  HTN     - continue home regimen  HLD     - continue home regimen  DM2     - SSI     - NPO except sips/meds until eval'd by PCCM  CKD3a     - at baseline, follow  Advance Care Planning:   Code Status: FULL  Consults: PCCM  Family Communication: Spoke with wife by phone  Severity of Illness: The appropriate patient status for this patient is INPATIENT. Inpatient status is judged to be reasonable and necessary in order to provide the required intensity of service to ensure the patient's safety. The patient's presenting symptoms, physical exam findings, and initial radiographic and laboratory data  in the context of their chronic comorbidities is felt to place them at high risk for further clinical deterioration. Furthermore, it is not anticipated that the patient will be medically stable for discharge from the hospital within 2 midnights of admission.   * I certify that at the point of admission it is my clinical judgment that the patient will require inpatient hospital care spanning beyond 2 midnights from the point of admission due to high intensity of service, high risk for further deterioration and high frequency of surveillance required.*  Author: Jonnie Finner, DO 01/15/2022 7:55 AM  For on call review [email or web address].

## 2022-01-15 NOTE — Progress Notes (Signed)
ANTICOAGULATION CONSULT NOTE  Pharmacy Consult for Heparin Indication: pulmonary embolus  Allergies  Allergen Reactions   Sildenafil Other (See Comments)    Caused a headache    Patient Measurements: Height: 5\' 9"  (175.3 cm) Weight: 76 kg (167 lb 8.8 oz) IBW/kg (Calculated) : 70.7 Heparin Dosing Weight: TBW  Vital Signs: Temp: 98 F (36.7 C) (07/21 1036) Temp Source: Oral (07/21 1036) BP: 152/102 (07/21 1036) Pulse Rate: 100 (07/21 1036)  Labs: Recent Labs    01/14/22 2350 01/15/22 0013 01/15/22 1106  HGB 15.5 15.6  --   HCT 45.9 46.0  --   PLT 186  --   --   HEPARINUNFRC  --   --  0.40  CREATININE 1.35* 1.30*  --      Estimated Creatinine Clearance: 48.3 mL/min (A) (by C-G formula based on SCr of 1.3 mg/dL (H)).   Medical History: 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[place])    Borderline diabetes    Cataract    bilateral lens implants   Diabetes mellitus without complication (HCC)    HTN (hypertension)    Hyperlipidemia    Insomnia, unspecified    Perirectal abscess 05/10/2013   Personal history of colonic adenomas 04/10/2010   Psychosexual dysfunction with inhibited sexual excitement    Stroke ([place])    Subacute intracranial hemorrhage (HCC)    Unspecified hemorrhoids without mention of complication     Medications:  Infusions:   heparin 1,300 Units/hr (01/15/22 0222)    Assessment: 76 yo M with recently diagnosed lung CA s/p lung biopsy 7/19 presents with shortness of breath, cough and bloody sputum. Chest CT + PE with evidence of right heart strain.   Pharmacy consulted to dose IV heparin.  He is not on anticoagulation at home.  Baseline CBC WNL.    Today, 01/15/22 -Heparin level 0.40 - therapeutic on heparin infusion at 1300 units/hr -Hemoglobin remains stable  Goal of Therapy:  Heparin level 0.3-0.7 units/ml Monitor platelets by anticoagulation protocol: Yes   Plan:  -Continue heparin infusion at  1300 units/hr -Check heparin level at 1900 to confirm -Daily heparin level & CBC while on heparin -Continue to closely monitor for changes in hemoptysis -F/U long-term anticoagulation plans once stable    Tawnya Crook, PharmD, BCPS Clinical Pharmacist 01/15/2022 12:09 PM

## 2022-01-16 ENCOUNTER — Inpatient Hospital Stay (HOSPITAL_COMMUNITY): Payer: Medicare Other

## 2022-01-16 DIAGNOSIS — I82411 Acute embolism and thrombosis of right femoral vein: Secondary | ICD-10-CM | POA: Diagnosis not present

## 2022-01-16 DIAGNOSIS — I82401 Acute embolism and thrombosis of unspecified deep veins of right lower extremity: Secondary | ICD-10-CM | POA: Diagnosis present

## 2022-01-16 DIAGNOSIS — I2699 Other pulmonary embolism without acute cor pulmonale: Secondary | ICD-10-CM | POA: Diagnosis not present

## 2022-01-16 DIAGNOSIS — R918 Other nonspecific abnormal finding of lung field: Secondary | ICD-10-CM

## 2022-01-16 DIAGNOSIS — I10 Essential (primary) hypertension: Secondary | ICD-10-CM

## 2022-01-16 LAB — COMPREHENSIVE METABOLIC PANEL
ALT: 24 U/L (ref 0–44)
AST: 29 U/L (ref 15–41)
Albumin: 2.7 g/dL — ABNORMAL LOW (ref 3.5–5.0)
Alkaline Phosphatase: 142 U/L — ABNORMAL HIGH (ref 38–126)
Anion gap: 10 (ref 5–15)
BUN: 12 mg/dL (ref 8–23)
CO2: 26 mmol/L (ref 22–32)
Calcium: 8.7 mg/dL — ABNORMAL LOW (ref 8.9–10.3)
Chloride: 104 mmol/L (ref 98–111)
Creatinine, Ser: 1.08 mg/dL (ref 0.61–1.24)
GFR, Estimated: >60 mL/min (ref >60)
Glucose, Bld: 105 mg/dL — ABNORMAL HIGH (ref 70–99)
Potassium: 3.2 mmol/L — ABNORMAL LOW (ref 3.5–5.1)
Sodium: 140 mmol/L (ref 135–145)
Total Bilirubin: 0.6 mg/dL (ref 0.3–1.2)
Total Protein: 6.1 g/dL — ABNORMAL LOW (ref 6.5–8.1)

## 2022-01-16 LAB — CBC
HCT: 39.5 % (ref 39.0–52.0)
Hemoglobin: 13.3 g/dL (ref 13.0–17.0)
MCH: 30.4 pg (ref 26.0–34.0)
MCHC: 33.7 g/dL (ref 30.0–36.0)
MCV: 90.2 fL (ref 80.0–100.0)
Platelets: 200 10*3/uL (ref 150–400)
RBC: 4.38 MIL/uL (ref 4.22–5.81)
RDW: 13 % (ref 11.5–15.5)
WBC: 9.4 10*3/uL (ref 4.0–10.5)
nRBC: 0 % (ref 0.0–0.2)

## 2022-01-16 LAB — GLUCOSE, CAPILLARY
Glucose-Capillary: 86 mg/dL (ref 70–99)
Glucose-Capillary: 115 mg/dL — ABNORMAL HIGH (ref 70–99)
Glucose-Capillary: 94 mg/dL (ref 70–99)
Glucose-Capillary: 94 mg/dL (ref 70–99)

## 2022-01-16 LAB — LACTIC ACID, PLASMA
Lactic Acid, Venous: 1.2 mmol/L (ref 0.5–1.9)
Lactic Acid, Venous: 1.1 mmol/L (ref 0.5–1.9)

## 2022-01-16 LAB — TROPONIN I (HIGH SENSITIVITY)
Troponin I (High Sensitivity): 278 ng/L (ref <18)
Troponin I (High Sensitivity): 254 ng/L (ref <18)

## 2022-01-16 LAB — HEPARIN LEVEL (UNFRACTIONATED): Heparin Unfractionated: 0.62 IU/mL (ref 0.30–0.70)

## 2022-01-16 LAB — BRAIN NATRIURETIC PEPTIDE
B Natriuretic Peptide: 399.7 pg/mL — ABNORMAL HIGH (ref 0.0–100.0)
B Natriuretic Peptide: 315.2 pg/mL — ABNORMAL HIGH (ref 0.0–100.0)

## 2022-01-16 MED ORDER — DM-GUAIFENESIN ER 30-600 MG PO TB12
1.0000 | ORAL_TABLET | Freq: Two times a day (BID) | ORAL | Status: DC
  Administered 2022-01-16 – 2022-01-20 (×9): 1 via ORAL
  Filled 2022-01-16 (×9): qty 1

## 2022-01-16 MED ORDER — POTASSIUM CHLORIDE CRYS ER 20 MEQ PO TBCR
40.0000 meq | EXTENDED_RELEASE_TABLET | ORAL | Status: AC
  Administered 2022-01-16 (×2): 40 meq via ORAL
  Filled 2022-01-16 (×2): qty 2

## 2022-01-16 MED ORDER — HYDRALAZINE HCL 20 MG/ML IJ SOLN
10.0000 mg | Freq: Four times a day (QID) | INTRAMUSCULAR | Status: DC | PRN
  Administered 2022-01-16 – 2022-01-17 (×2): 10 mg via INTRAVENOUS
  Filled 2022-01-16 (×2): qty 1

## 2022-01-16 MED ORDER — ENOXAPARIN SODIUM 80 MG/0.8ML IJ SOSY
70.0000 mg | PREFILLED_SYRINGE | Freq: Two times a day (BID) | INTRAMUSCULAR | Status: DC
  Administered 2022-01-16 – 2022-01-20 (×9): 70 mg via SUBCUTANEOUS
  Filled 2022-01-16: qty 0.8
  Filled 2022-01-16: qty 0.7
  Filled 2022-01-16 (×2): qty 0.8
  Filled 2022-01-16: qty 0.7
  Filled 2022-01-16: qty 0.8
  Filled 2022-01-16: qty 0.7
  Filled 2022-01-16 (×2): qty 0.8
  Filled 2022-01-16: qty 0.7

## 2022-01-16 MED ORDER — ENSURE ENLIVE PO LIQD
237.0000 mL | Freq: Two times a day (BID) | ORAL | Status: DC
Start: 2022-01-16 — End: 2022-01-20
  Administered 2022-01-18 – 2022-01-20 (×4): 237 mL via ORAL

## 2022-01-16 MED ORDER — LOSARTAN POTASSIUM 50 MG PO TABS
100.0000 mg | ORAL_TABLET | Freq: Every day | ORAL | Status: DC
  Administered 2022-01-16 – 2022-01-20 (×5): 100 mg via ORAL
  Filled 2022-01-16 (×5): qty 2

## 2022-01-16 MED ORDER — METOPROLOL SUCCINATE ER 25 MG PO TB24
25.0000 mg | ORAL_TABLET | Freq: Every day | ORAL | Status: DC
  Administered 2022-01-16 – 2022-01-17 (×2): 25 mg via ORAL
  Filled 2022-01-16 (×2): qty 1

## 2022-01-16 NOTE — Progress Notes (Signed)
PROGRESS NOTE    Drew Harrington  FMB:[id number] DOB: Dec 10, 1945 DOA: 01/14/2022 PCP: Hoyt Koch, MD    Brief Narrative:  76 year old male with a history of hypertension, diabetes, hyperlipidemia, recently diagnosed non-small cell lung cancer/adenocarcinoma, presents with acute onset of shortness of breath.  Found to have acute pulmonary embolism with acute cor pulmonale.  Also noted to have right lower extremity DVT.  Admitted to the hospital and started on anticoagulation.  PCCM, oncology following.   Assessment & Plan:   Principal Problem:   Acute pulmonary embolism with acute cor pulmonale (HCC) Active Problems:   HLD (hyperlipidemia)   HTN (hypertension)   DM2 (diabetes mellitus, type 2) (HCC)   Mass of left lung   Hemoptysis   Chest pain   Stage 3a chronic kidney disease (CKD) (HCC)   Right leg DVT (HCC)   Acute pulmonary embolism with acute cor pulmonale Acute right lower extremity DVT -Initially started on heparin infusion, subsequently transition to Lovenox -Per oncology, will need to discharge with Lovenox therapy for at least the next 2 months -Echo shows that RV systolic function is severely reduced.  RV size is normal.  LVEF is normal. -Continue to wean down oxygen as tolerated -Continue to monitor in stepdown  Acute respiratory failure with hypoxia -Secondary to acute pulmonary embolus -Currently on 10 L of oxygen -Wean down oxygen as tolerated  Elevated troponin -Secondary to demand ischemia in the setting of acute PE  Non-small cell lung cancer -Recent diagnosis -Being followed by oncology for further work-up -Plans are for outpatient PET scan -MRI brain ordered by oncology to complete staging work-up  Hypokalemia -Replace  Diabetes type 2 -Metformin currently on hold -Continue on sliding scale insulin  Hypertension -Blood pressures are running high -Resume home dose of Toprol -Can also resume losartan as blood pressure will  tolerate  CKD stage IIIa -Creatinine is currently at baseline  Hyperlipidemia -Continue statin   DVT prophylaxis: therapeutic lovenox  Code Status: DNR Family Communication: discussed with patient Disposition Plan: Status is: Inpatient Remains inpatient appropriate because: Continued anticoagulation for massive PE and cor pulmonale     Consultants:  PCCM Oncology  Procedures:  Echo  Antimicrobials:      Subjective: Overall he says he does feel better today.  Denies any chest pain.  He is on 10 L of oxygen, but says he does not feel short of breath at this time.  Objective: Vitals:   01/16/22 0803 01/16/22 0900 01/16/22 1000 01/16/22 1100  BP: (!) 178/93 (!) 161/84 139/76 (!) 169/95  Pulse: 90 (!) 102 94 93  Resp: (!) 23 19 20  (!) 21  Temp:      TempSrc:      SpO2: 98% 94% 97% 97%  Weight:      Height:        Intake/Output Summary (Last 24 hours) at 01/16/2022 1140 Last data filed at 01/16/2022 1100 Gross per 24 hour  Intake 420.84 ml  Output 400 ml  Net 20.84 ml   Filed Weights   01/14/22 2342 01/15/22 1746  Weight: 76 kg 72.5 kg    Examination:  General exam: Appears calm and comfortable  Respiratory system: Clear to auscultation. Respiratory effort normal. Cardiovascular system: S1 & S2 heard, RRR. No JVD, murmurs, rubs, gallops or clicks. No pedal edema. Gastrointestinal system: Abdomen is nondistended, soft and nontender. No organomegaly or masses felt. Normal bowel sounds heard. Central nervous system: Alert and oriented. No focal neurological deficits. Extremities: Symmetric 5 x 5  power. Skin: No rashes, lesions or ulcers Psychiatry: Judgement and insight appear normal. Mood & affect appropriate.     Data Reviewed: I have personally reviewed following labs and imaging studies  CBC: Recent Labs  Lab 01/14/22 2350 01/15/22 0013 01/16/22 0520  WBC 9.7  --  9.4  NEUTROABS 7.8*  --   --   HGB 15.5 15.6 13.3  HCT 45.9 46.0 39.5  MCV  91.1  --  90.2  PLT 186  --  536   Basic Metabolic Panel: Recent Labs  Lab 01/14/22 2350 01/15/22 0013 01/16/22 0520  NA 140 138 140  K 3.6 3.5 3.2*  CL 98 97* 104  CO2 28  --  26  GLUCOSE 238* 234* 105*  BUN 14 12 12   CREATININE 1.35* 1.30* 1.08  CALCIUM 9.4  --  8.7*   GFR: Estimated Creatinine Clearance: 58.2 mL/min (by C-G formula based on SCr of 1.08 mg/dL). Liver Function Tests: Recent Labs  Lab 01/14/22 2350 01/15/22 1106 01/16/22 0520  AST 35 30 29  ALT 29 25 24   ALKPHOS 182* 163* 142*  BILITOT 0.7 0.2* 0.6  PROT 7.6 7.0 6.1*  ALBUMIN 3.4* 3.1* 2.7*   No results for input(s): "LIPASE", "AMYLASE" in the last 168 hours. No results for input(s): "AMMONIA" in the last 168 hours. Coagulation Profile: No results for input(s): "INR", "PROTIME" in the last 168 hours. Cardiac Enzymes: No results for input(s): "CKTOTAL", "CKMB", "CKMBINDEX", "TROPONINI" in the last 168 hours. BNP (last 3 results) No results for input(s): "PROBNP" in the last 8760 hours. HbA1C: No results for input(s): "HGBA1C" in the last 72 hours. CBG: Recent Labs  Lab 01/15/22 0952 01/15/22 1224 01/15/22 1646 01/15/22 2148 01/16/22 0736  GLUCAP 121* 121* 126* 105* 86   Lipid Profile: No results for input(s): "CHOL", "HDL", "LDLCALC", "TRIG", "CHOLHDL", "LDLDIRECT" in the last 72 hours. Thyroid Function Tests: No results for input(s): "TSH", "T4TOTAL", "FREET4", "T3FREE", "THYROIDAB" in the last 72 hours. Anemia Panel: No results for input(s): "VITAMINB12", "FOLATE", "FERRITIN", "TIBC", "IRON", "RETICCTPCT" in the last 72 hours. Sepsis Labs: Recent Labs  Lab 01/15/22 1106 01/15/22 2247 01/16/22 0520 01/16/22 1039  LATICACIDVEN 1.5 1.3 1.2 1.1    Recent Results (from the past 240 hour(s))  SARS Coronavirus 2 by RT PCR (hospital order, performed in [facility] hospital lab) *cepheid single result test* Anterior Nasal Swab     Status: None   Collection Time: 01/13/22  6:39 AM    Specimen: Anterior Nasal Swab  Result Value Ref Range Status   SARS Coronavirus 2 by RT PCR NEGATIVE NEGATIVE Final    Comment: (NOTE) SARS-CoV-2 target nucleic acids are NOT DETECTED.  The SARS-CoV-2 RNA is generally detectable in upper and lower respiratory specimens during the acute phase of infection. The lowest concentration of SARS-CoV-2 viral copies this assay can detect is 250 copies / mL. A negative result does not preclude SARS-CoV-2 infection and should not be used as the sole basis for treatment or other patient management decisions.  A negative result may occur with improper specimen collection / handling, submission of specimen other than nasopharyngeal swab, presence of viral mutation(s) within the areas targeted by this assay, and inadequate number of viral copies (<250 copies / mL). A negative result must be combined with clinical observations, patient history, and epidemiological information.  Fact Sheet for Patients:   https://www.patel.info/  Fact Sheet for Healthcare Providers: https://hall.com/  This test is not yet approved or  cleared by the [place] FDA  and has been authorized for detection and/or diagnosis of SARS-CoV-2 by FDA under an Emergency Use Authorization (EUA).  This EUA will remain in effect (meaning this test can be used) for the duration of the COVID-19 declaration under Section 564(b)(1) of the Act, 21 U.S.C. section 360bbb-3(b)(1), unless the authorization is terminated or revoked sooner.  Performed at [facility], [place] [address], [place], [place] [address]   Resp Panel by RT-PCR (Flu A&B, Covid) Anterior Nasal Swab     Status: None   Collection Time: 01/14/22 11:50 PM   Specimen: Anterior Nasal Swab  Result Value Ref Range Status   SARS Coronavirus 2 by RT PCR NEGATIVE NEGATIVE Final    Comment: (NOTE) SARS-CoV-2 target nucleic acids are NOT DETECTED.  The SARS-CoV-2 RNA is  generally detectable in upper respiratory specimens during the acute phase of infection. The lowest concentration of SARS-CoV-2 viral copies this assay can detect is 138 copies/mL. A negative result does not preclude SARS-Cov-2 infection and should not be used as the sole basis for treatment or other patient management decisions. A negative result may occur with  improper specimen collection/handling, submission of specimen other than nasopharyngeal swab, presence of viral mutation(s) within the areas targeted by this assay, and inadequate number of viral copies(<138 copies/mL). A negative result must be combined with clinical observations, patient history, and epidemiological information. The expected result is Negative.  Fact Sheet for Patients:  EntrepreneurPulse.com.au  Fact Sheet for Healthcare Providers:  IncredibleEmployment.be  This test is no t yet approved or cleared by the [place] FDA and  has been authorized for detection and/or diagnosis of SARS-CoV-2 by FDA under an Emergency Use Authorization (EUA). This EUA will remain  in effect (meaning this test can be used) for the duration of the COVID-19 declaration under Section 564(b)(1) of the Act, 21 U.S.C.section 360bbb-3(b)(1), unless the authorization is terminated  or revoked sooner.       Influenza A by PCR NEGATIVE NEGATIVE Final   Influenza B by PCR NEGATIVE NEGATIVE Final    Comment: (NOTE) The Xpert Xpress SARS-CoV-2/FLU/RSV plus assay is intended as an aid in the diagnosis of influenza from Nasopharyngeal swab specimens and should not be used as a sole basis for treatment. Nasal washings and aspirates are unacceptable for Xpert Xpress SARS-CoV-2/FLU/RSV testing.  Fact Sheet for Patients: EntrepreneurPulse.com.au  Fact Sheet for Healthcare Providers: IncredibleEmployment.be  This test is not yet approved or cleared by the [place] FDA and has been authorized for detection and/or diagnosis of SARS-CoV-2 by FDA under an Emergency Use Authorization (EUA). This EUA will remain in effect (meaning this test can be used) for the duration of the COVID-19 declaration under Section 564(b)(1) of the Act, 21 U.S.C. section 360bbb-3(b)(1), unless the authorization is terminated or revoked.  Performed at [facility], [place] [address], [place], [place] [address]   MRSA Next Gen by PCR, Nasal     Status: None   Collection Time: 01/15/22  5:39 PM   Specimen: Nasal Mucosa; Nasal Swab  Result Value Ref Range Status   MRSA by PCR Next Gen NOT DETECTED NOT DETECTED Final    Comment: (NOTE) The GeneXpert MRSA Assay (FDA approved for NASAL specimens only), is one component of a comprehensive MRSA colonization surveillance program. It is not intended to diagnose MRSA infection nor to guide or monitor treatment for MRSA infections. Test performance is not FDA approved in patients less than 105 years old. Performed at [facility],  [place] [address], [place], [place] [address]          Radiology Studies: ECHOCARDIOGRAM COMPLETE  Result Date: 01/15/2022    ECHOCARDIOGRAM REPORT   Patient Name:   Drew Harrington Date of Exam: 01/15/2022 Medical Rec #:  [id number]         Height:       69.0 in Accession #:    [id number]        Weight:       167.5 lb Date of Birth:  May 16, 1946          BSA:          1.916 m Patient Age:    43 years          BP:           125/97 mmHg Patient Gender: M                 HR:           93 bpm. Exam Location:  Inpatient Procedure: 2D Echo, Cardiac Doppler and Color Doppler Indications:    Pulmonary embolus  History:        Patient has prior history of Echocardiogram examinations. Risk                 Factors:Hypertension and Diabetes.  Sonographer:    Jyl Heinz Referring Phys: [id number] Clever  1. Cor pulmonale with severe TR and RV dysfucntion in  setting of clinical PE.  2. Left ventricular ejection fraction, by estimation, is 55%. The left ventricle has normal function. The left ventricle has no regional wall motion abnormalities. Left ventricular diastolic parameters are consistent with Grade I diastolic dysfunction (impaired relaxation).  3. Right ventricular systolic function is severely reduced. The right ventricular size is normal.  4. The mitral valve is abnormal. Trivial mitral valve regurgitation. No evidence of mitral stenosis.  5. Tricuspid valve regurgitation is severe.  6. The aortic valve is tricuspid. There is mild calcification of the aortic valve. There is mild thickening of the aortic valve. Aortic valve regurgitation is not visualized. Aortic valve sclerosis is present, with no evidence of aortic valve stenosis.  7. The inferior vena cava is dilated in size with >50% respiratory variability, suggesting right atrial pressure of 8 mmHg. FINDINGS  Left Ventricle: Left ventricular ejection fraction, by estimation, is 55%. The left ventricle has normal function. The left ventricle has no regional wall motion abnormalities. The left ventricular internal cavity size was normal in size. There is no left ventricular hypertrophy. Left ventricular diastolic parameters are consistent with Grade I diastolic dysfunction (impaired relaxation). Right Ventricle: The right ventricular size is normal. No increase in right ventricular wall thickness. Right ventricular systolic function is severely reduced. Left Atrium: Left atrial size was normal in size. Right Atrium: Right atrial size was normal in size. Pericardium: There is no evidence of pericardial effusion. Mitral Valve: The mitral valve is abnormal. There is mild thickening of the mitral valve leaflet(s). There is mild calcification of the mitral valve leaflet(s). Trivial mitral valve regurgitation. No evidence of mitral valve stenosis. Tricuspid Valve: The tricuspid valve is normal in structure.  Tricuspid valve regurgitation is severe. No evidence of tricuspid stenosis. Aortic Valve: The aortic valve is tricuspid. There is mild calcification of the aortic valve. There is mild thickening of the aortic valve. Aortic valve regurgitation is not visualized. Aortic valve sclerosis is present, with no evidence of aortic valve stenosis. Aortic valve peak gradient measures  3.3 mmHg. Pulmonic Valve: The pulmonic valve was normal in structure. Pulmonic valve regurgitation is trivial. No evidence of pulmonic stenosis. Aorta: The aortic root is normal in size and structure. Venous: The inferior vena cava is dilated in size with greater than 50% respiratory variability, suggesting right atrial pressure of 8 mmHg. IAS/Shunts: No atrial level shunt detected by color flow Doppler. 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  LEFT VENTRICLE PLAX 2D LVIDd:         3.60 cm     Diastology LVIDs:         2.30 cm     LV e' medial:    5.33 cm/s LV PW:         1.30 cm     LV E/e' medial:  14.5 LV IVS:        1.40 cm     LV e' lateral:   5.98 cm/s LVOT diam:     2.00 cm     LV E/e' lateral: 12.9 LV SV:         40 LV SV Index:   21 LVOT Area:     3.14 cm  LV Volumes (MOD) LV vol d, MOD A2C: 76.4 ml LV vol d, MOD A4C: 72.7 ml LV vol s, MOD A2C: 28.5 ml LV vol s, MOD A4C: 30.2 ml LV SV MOD A2C:     47.9 ml LV SV MOD A4C:     72.7 ml LV SV MOD BP:      43.1 ml RIGHT VENTRICLE            IVC RV Basal diam:  3.70 cm    IVC diam: 1.90 cm RV Mid diam:    2.70 cm RV S prime:     8.27 cm/s TAPSE (M-mode): 1.8 cm LEFT ATRIUM           Index        RIGHT ATRIUM           Index LA diam:      2.70 cm 1.41 cm/m   RA Area:     18.20 cm LA Vol (A2C): 26.0 ml 13.57 ml/m  RA Volume:   54.90 ml  28.65 ml/m LA Vol (A4C): 38.7 ml 20.20 ml/m  AORTIC VALVE                 PULMONIC VALVE AV Area (Vmax): 2.87 cm     PR End Diast Vel: 1.41 msec AV Vmax:        91.50 cm/s AV Peak Grad:   3.3 mmHg LVOT  Vmax:      83.60 cm/s LVOT Vmean:     66.400 cm/s LVOT VTI:       0.126 m  AORTA Ao Root diam: 3.60 cm Ao Asc diam:  3.70 cm MITRAL VALVE               TRICUSPID VALVE MV Area (PHT): 10.54 cm   TR Peak grad:   41.7 mmHg MV Decel Time: 72 msec     TR Vmax:        323.00 cm/s MV E velocity: 77.10 cm/s MV A velocity: 76.30 cm/s  SHUNTS MV E/A ratio:  1.01        Systemic VTI:  0.13 m                            Systemic Diam:  2.00 cm Jenkins Rouge MD Electronically signed by Jenkins Rouge MD Signature Date/Time: 01/15/2022/2:01:41 PM    Final    VAS [place] LOWER EXTREMITY VENOUS (DVT)  Result Date: 01/15/2022  Lower Venous DVT Study Patient Name:  Drew Harrington  Date of Exam:   01/15/2022 Medical Rec #: [id number]          Accession #:    [id number] Date of Birth: 1945-11-30           Patient Gender: M Patient Age:   10 years Exam Location:  [facility] Procedure:      VAS [place] LOWER EXTREMITY VENOUS (DVT) Referring Phys: Nevin Bloodgood SIMPSON --------------------------------------------------------------------------------  Indications: Pulmonary embolism.  Risk Factors: Confirmed PE. Anticoagulation: Heparin. Comparison Study: No prior studies. Performing Technologist: Oliver Hum RVT  Examination Guidelines: A complete evaluation includes B-mode imaging, spectral Doppler, color Doppler, and power Doppler as needed of all accessible portions of each vessel. Bilateral testing is considered an integral part of a complete examination. Limited examinations for reoccurring indications may be performed as noted. The reflux portion of the exam is performed with the patient in reverse Trendelenburg.  +---------+---------------+---------+-----------+----------+--------------+ RIGHT    CompressibilityPhasicitySpontaneityPropertiesThrombus Aging +---------+---------------+---------+-----------+----------+--------------+ CFV      Full           Yes      Yes                                  +---------+---------------+---------+-----------+----------+--------------+ SFJ      Full                                                        +---------+---------------+---------+-----------+----------+--------------+ FV Prox  Full                                                        +---------+---------------+---------+-----------+----------+--------------+ FV Mid   Full                                                        +---------+---------------+---------+-----------+----------+--------------+ FV DistalPartial        Yes      No                   Acute          +---------+---------------+---------+-----------+----------+--------------+ PFV      Full                                                        +---------+---------------+---------+-----------+----------+--------------+ POP      None           No       No                   Acute          +---------+---------------+---------+-----------+----------+--------------+  PTV      Full                                                        +---------+---------------+---------+-----------+----------+--------------+ PERO     Partial                                      Acute          +---------+---------------+---------+-----------+----------+--------------+ Soleal   Partial                                      Acute          +---------+---------------+---------+-----------+----------+--------------+   +---------+---------------+---------+-----------+----------+--------------+ LEFT     CompressibilityPhasicitySpontaneityPropertiesThrombus Aging +---------+---------------+---------+-----------+----------+--------------+ CFV      Full           Yes      Yes                                 +---------+---------------+---------+-----------+----------+--------------+ SFJ      Full                                                         +---------+---------------+---------+-----------+----------+--------------+ FV Prox  Full                                                        +---------+---------------+---------+-----------+----------+--------------+ FV Mid   Full                                                        +---------+---------------+---------+-----------+----------+--------------+ FV DistalFull                                                        +---------+---------------+---------+-----------+----------+--------------+ PFV      Full                                                        +---------+---------------+---------+-----------+----------+--------------+ POP      Full           Yes      Yes                                 +---------+---------------+---------+-----------+----------+--------------+  PTV      Full                                                        +---------+---------------+---------+-----------+----------+--------------+ PERO     Full                                                        +---------+---------------+---------+-----------+----------+--------------+    Summary: RIGHT: - Findings consistent with acute deep vein thrombosis involving the right femoral vein, right popliteal vein, right peroneal veins, and right soleal veins. - No cystic structure found in the popliteal fossa.  LEFT: - There is no evidence of deep vein thrombosis in the lower extremity.  - No cystic structure found in the popliteal fossa.  *See table(s) above for measurements and observations.    Preliminary    CT Angio Chest PE W and/or Wo Contrast  Result Date: 01/15/2022 CLINICAL DATA:  Pulmonary embolus suspected with high probability. Productive cough, low oxygen saturation, recent diagnosis of lung cancer. EXAM: CT ANGIOGRAPHY CHEST WITH CONTRAST TECHNIQUE: Multidetector CT imaging of the chest was performed using the standard protocol during bolus administration of  intravenous contrast. Multiplanar CT image reconstructions and MIPs were obtained to evaluate the vascular anatomy. RADIATION DOSE REDUCTION: This exam was performed according to the departmental dose-optimization program which includes automated exposure control, adjustment of the mA and/or kV according to patient size and/or use of iterative reconstruction technique. CONTRAST:  39mL OMNIPAQUE IOHEXOL 350 MG/ML SOLN COMPARISON:  12/30/2021 FINDINGS: Cardiovascular: Large filling defects are demonstrated in the right main pulmonary artery and extending into upper and lower lobe branches consistent with acute pulmonary embolus. Finding is new since prior study. Cardiac enlargement. No pericardial effusion. Elevated RV to LV ratio at 1.5. Normal caliber thoracic aorta. There is eccentric thrombus in the aortic arch and descending aorta, similar to prior study. Mediastinum/Nodes: Esophagus is decompressed. Lymphadenopathy in the left hilum and left paratracheal region, similar to prior study. Thyroid gland is unremarkable. Lungs/Pleura: Large left apical mass extending to the left hilum and surrounding the left subclavian artery with partially surrounding the left carotid artery origin. Mass measures up to about 7.2 x 8 cm in diameter. This is likely to represent primary lung cancer. There is atelectasis or consolidation in the left upper lung and left lower lung. Moderate left pleural effusion. Patchy peripheral wedge-shaped opacities are demonstrated in the right upper lung, new since prior study, possibly peripheral infarcts. Upper Abdomen: Large left adrenal gland nodule measuring 3.8 cm diameter, unchanged since prior study and likely metastatic. Circumscribed low-attenuation lesion centrally in the liver measuring 3.6 cm diameter is unchanged since prior study, likely representing a benign cyst. Musculoskeletal: No chest wall abnormality. No acute or significant osseous findings. Review of the MIP images confirms  the above findings. IMPRESSION: 1. Positive examination for pulmonary embolus with large right main and multiple peripheral pulmonary emboli. Elevated RV to LV ratio at 1.5 suggesting right heart strain. Positive for acute PE with CT evidence of right heart strain (RV/LV Ratio = 1.5) consistent with at least submassive (intermediate risk) PE. The presence of right heart strain has been associated with  an increased risk of morbidity and mortality. Please refer to the "Code PE Focused" order set in EPIC. 2. Again demonstrated is a large left apical/hilar/mediastinal mass lesion likely representing primary lung cancer with lymphadenopathy and direct mediastinal invasion. 3.8 cm left adrenal gland nodule most likely represents a metastasis. 3. Moderate left pleural effusion with atelectasis or infiltration in the left lung. 4. Probable small peripheral infarcts in the right lung. Critical Value/emergent results were called by telephone at the time of interpretation on 01/15/2022 at 1:25 am to provider Tegler, who verbally acknowledged these results. Electronically Signed   By: Lucienne Capers M.D.   On: 01/15/2022 01:37   DG Chest Port 1 View  Result Date: 01/15/2022 CLINICAL DATA:  Shortness of breath, cough with bloody sputum. Recent diagnosis of lung cancer with biopsy. EXAM: PORTABLE CHEST 1 VIEW COMPARISON:  12/25/2021. FINDINGS: The heart size and mediastinal contours are stable. A consolidative opacity is noted in the left upper lobe at the paramediastinal border and is unchanged from the prior exam. Mild atelectasis is present at the left lung base. The right lung is clear. No effusion or pneumothorax. No acute osseous abnormality. IMPRESSION: 1. Stable mass in the left upper lobe along the paramediastinal border. 2. Mild atelectasis at the left lung base. Electronically Signed   By: Brett Fairy M.D.   On: 01/15/2022 00:12        Scheduled Meds:  Chlorhexidine Gluconate Cloth  6 each Topical Daily    dextromethorphan-guaiFENesin  1 tablet Oral BID   donepezil  5 mg Oral QHS   enoxaparin (LOVENOX) injection  70 mg Subcutaneous Q12H   feeding supplement  237 mL Oral BID BM   insulin aspart  0-9 Units Subcutaneous TID WC   mouth rinse  15 mL Mouth Rinse 4 times per day   primidone  500 mg Oral Daily   primidone  750 mg Oral QHS   simvastatin  20 mg Oral QHS   Continuous Infusions:   LOS: 1 day    Time spent: 81mins    Kathie Dike, MD [facility]   If 7PM-7AM, please contact night-coverage [email or web address]  01/16/2022, 11:40 AM

## 2022-01-16 NOTE — Progress Notes (Signed)
Subjective: The patient is seen and examined today.  His wife was at the bedside and son was available by phone during the visit.  This is a very pleasant 76 years old African-American male who was recently diagnosed with a stage IVb (T3, and 3, M1b) non-small cell lung cancer, adenocarcinoma presented with large apical left upper lobe lung mass in addition to mediastinal and left supraclavicular lymphadenopathy and suspicious left adrenal metastasis diagnosed in July 2023. The patient was admitted to the hospital with worsening dyspnea and repeat CT angiogram of the chest showed evidence for pulmonary embolus with large right main and multiple peripheral pulmonary emboli with evidence of right heart strain consistent with at least submassive PE.  The patient was admitted and started on IV heparin.  He is feeling a little bit better but continues to have shortness of breath.  He has Doppler of the lower extremity and it was consistent with acute deep vein thrombosis involving the right femoral vein, right popliteal, right peroneal and right soleal vein.  He is feeling a little bit better today with no concerning chest pain but has cough with no hemoptysis.  He has no nausea, vomiting, diarrhea or constipation.  He has no headache or visual changes.  Objective: Vital signs in last 24 hours: Temp:  [97.6 F (36.4 C)-98.6 F (37 C)] 98.1 F (36.7 C) (07/22 0800) Pulse Rate:  [86-102] 102 (07/22 0900) Resp:  [0-24] 19 (07/22 0900) BP: (117-178)/(69-110) 161/84 (07/22 0900) SpO2:  [85 %-100 %] 94 % (07/22 0900) FiO2 (%):  [36 %] 36 % (07/21 1045) Weight:  [159 lb 13.3 oz (72.5 kg)] 159 lb 13.3 oz (72.5 kg) (07/21 1746)  Intake/Output from previous day: 07/21 0701 - 07/22 0700 In: 306.1 [I.V.:306.1] Out: 200 [Urine:200] Intake/Output this shift: Total I/O In: 78 [I.V.:78] Out: -   General appearance: alert, cooperative, fatigued, and no distress Resp: diminished breath sounds LUL and dullness to  percussion LUL Cardio: regular rate and rhythm, S1, S2 normal, no murmur, click, rub or gallop GI: soft, non-tender; bowel sounds normal; no masses,  no organomegaly Extremities: extremities normal, atraumatic, no cyanosis or edema  Lab Results:  Recent Labs    01/14/22 2350 01/15/22 0013 01/16/22 0520  WBC 9.7  --  9.4  HGB 15.5 15.6 13.3  HCT 45.9 46.0 39.5  PLT 186  --  200   BMET Recent Labs    01/14/22 2350 01/15/22 0013 01/16/22 0520  NA 140 138 140  K 3.6 3.5 3.2*  CL 98 97* 104  CO2 28  --  26  GLUCOSE 238* 234* 105*  BUN 14 12 12   CREATININE 1.35* 1.30* 1.08  CALCIUM 9.4  --  8.7*    Studies/Results: ECHOCARDIOGRAM COMPLETE  Result Date: 01/15/2022    ECHOCARDIOGRAM REPORT   Patient Name:   Drew Harrington Date of Exam: 01/15/2022 Medical Rec #:  [id number]         Height:       69.0 in Accession #:    [id number]        Weight:       167.5 lb Date of Birth:  15-Aug-1945          BSA:          1.916 m Patient Age:    33 years          BP:           125/97 mmHg Patient Gender: M  HR:           93 bpm. Exam Location:  Inpatient Procedure: 2D Echo, Cardiac Doppler and Color Doppler Indications:    Pulmonary embolus  History:        Patient has prior history of Echocardiogram examinations. Risk                 Factors:Hypertension and Diabetes.  Sonographer:    Jyl Heinz Referring Phys: [id number] [place]  1. Cor pulmonale with severe TR and RV dysfucntion in setting of clinical PE.  2. Left ventricular ejection fraction, by estimation, is 55%. The left ventricle has normal function. The left ventricle has no regional wall motion abnormalities. Left ventricular diastolic parameters are consistent with Grade I diastolic dysfunction (impaired relaxation).  3. Right ventricular systolic function is severely reduced. The right ventricular size is normal.  4. The mitral valve is abnormal. Trivial mitral valve regurgitation. No evidence of mitral  stenosis.  5. Tricuspid valve regurgitation is severe.  6. The aortic valve is tricuspid. There is mild calcification of the aortic valve. There is mild thickening of the aortic valve. Aortic valve regurgitation is not visualized. Aortic valve sclerosis is present, with no evidence of aortic valve stenosis.  7. The inferior vena cava is dilated in size with >50% respiratory variability, suggesting right atrial pressure of 8 mmHg. FINDINGS  Left Ventricle: Left ventricular ejection fraction, by estimation, is 55%. The left ventricle has normal function. The left ventricle has no regional wall motion abnormalities. The left ventricular internal cavity size was normal in size. There is no left ventricular hypertrophy. Left ventricular diastolic parameters are consistent with Grade I diastolic dysfunction (impaired relaxation). Right Ventricle: The right ventricular size is normal. No increase in right ventricular wall thickness. Right ventricular systolic function is severely reduced. Left Atrium: Left atrial size was normal in size. Right Atrium: Right atrial size was normal in size. Pericardium: There is no evidence of pericardial effusion. Mitral Valve: The mitral valve is abnormal. There is mild thickening of the mitral valve leaflet(s). There is mild calcification of the mitral valve leaflet(s). Trivial mitral valve regurgitation. No evidence of mitral valve stenosis. Tricuspid Valve: The tricuspid valve is normal in structure. Tricuspid valve regurgitation is severe. No evidence of tricuspid stenosis. Aortic Valve: The aortic valve is tricuspid. There is mild calcification of the aortic valve. There is mild thickening of the aortic valve. Aortic valve regurgitation is not visualized. Aortic valve sclerosis is present, with no evidence of aortic valve stenosis. Aortic valve peak gradient measures 3.3 mmHg. Pulmonic Valve: The pulmonic valve was normal in structure. Pulmonic valve regurgitation is trivial. No  evidence of pulmonic stenosis. Aorta: The aortic root is normal in size and structure. Venous: The inferior vena cava is dilated in size with greater than 50% respiratory variability, suggesting right atrial pressure of 8 mmHg. IAS/Shunts: No atrial level shunt detected by color flow Doppler. 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  LEFT VENTRICLE PLAX 2D LVIDd:         3.60 cm     Diastology LVIDs:         2.30 cm     LV e' medial:    5.33 cm/s LV PW:         1.30 cm     LV E/e' medial:  14.5 LV IVS:        1.40 cm     LV e'  lateral:   5.98 cm/s LVOT diam:     2.00 cm     LV E/e' lateral: 12.9 LV SV:         40 LV SV Index:   21 LVOT Area:     3.14 cm  LV Volumes (MOD) LV vol d, MOD A2C: 76.4 ml LV vol d, MOD A4C: 72.7 ml LV vol s, MOD A2C: 28.5 ml LV vol s, MOD A4C: 30.2 ml LV SV MOD A2C:     47.9 ml LV SV MOD A4C:     72.7 ml LV SV MOD BP:      43.1 ml RIGHT VENTRICLE            IVC RV Basal diam:  3.70 cm    IVC diam: 1.90 cm RV Mid diam:    2.70 cm RV S prime:     8.27 cm/s TAPSE (M-mode): 1.8 cm LEFT ATRIUM           Index        RIGHT ATRIUM           Index LA diam:      2.70 cm 1.41 cm/m   RA Area:     18.20 cm LA Vol (A2C): 26.0 ml 13.57 ml/m  RA Volume:   54.90 ml  28.65 ml/m LA Vol (A4C): 38.7 ml 20.20 ml/m  AORTIC VALVE                 PULMONIC VALVE AV Area (Vmax): 2.87 cm     PR End Diast Vel: 1.41 msec AV Vmax:        91.50 cm/s AV Peak Grad:   3.3 mmHg LVOT Vmax:      83.60 cm/s LVOT Vmean:     66.400 cm/s LVOT VTI:       0.126 m  AORTA Ao Root diam: 3.60 cm Ao Asc diam:  3.70 cm MITRAL VALVE               TRICUSPID VALVE MV Area (PHT): 10.54 cm   TR Peak grad:   41.7 mmHg MV Decel Time: 72 msec     TR Vmax:        323.00 cm/s MV E velocity: 77.10 cm/s MV A velocity: 76.30 cm/s  SHUNTS MV E/A ratio:  1.01        Systemic VTI:  0.13 m                            Systemic Diam: 2.00 cm Jenkins Rouge MD Electronically signed by Jenkins Rouge  MD Signature Date/Time: 01/15/2022/2:01:41 PM    Final    VAS [place] LOWER EXTREMITY VENOUS (DVT)  Result Date: 01/15/2022  Lower Venous DVT Study Patient Name:  MEHKI KLUMPP  Date of Exam:   01/15/2022 Medical Rec #: [id number]          Accession #:    [id number] Date of Birth: Apr 03, 1946           Patient Gender: M Patient Age:   21 years Exam Location:  [facility] Procedure:      VAS [place] LOWER EXTREMITY VENOUS (DVT) Referring Phys: Nevin Bloodgood SIMPSON --------------------------------------------------------------------------------  Indications: Pulmonary embolism.  Risk Factors: Confirmed PE. Anticoagulation: Heparin. Comparison Study: No prior studies. Performing Technologist: Oliver Hum RVT  Examination Guidelines: A complete evaluation includes B-mode imaging, spectral Doppler, color Doppler, and power Doppler as needed of all accessible portions of each  vessel. Bilateral testing is considered an integral part of a complete examination. Limited examinations for reoccurring indications may be performed as noted. The reflux portion of the exam is performed with the patient in reverse Trendelenburg.  +---------+---------------+---------+-----------+----------+--------------+ RIGHT    CompressibilityPhasicitySpontaneityPropertiesThrombus Aging +---------+---------------+---------+-----------+----------+--------------+ CFV      Full           Yes      Yes                                 +---------+---------------+---------+-----------+----------+--------------+ SFJ      Full                                                        +---------+---------------+---------+-----------+----------+--------------+ FV Prox  Full                                                        +---------+---------------+---------+-----------+----------+--------------+ FV Mid   Full                                                         +---------+---------------+---------+-----------+----------+--------------+ FV DistalPartial        Yes      No                   Acute          +---------+---------------+---------+-----------+----------+--------------+ PFV      Full                                                        +---------+---------------+---------+-----------+----------+--------------+ POP      None           No       No                   Acute          +---------+---------------+---------+-----------+----------+--------------+ PTV      Full                                                        +---------+---------------+---------+-----------+----------+--------------+ PERO     Partial                                      Acute          +---------+---------------+---------+-----------+----------+--------------+ Soleal   Partial  Acute          +---------+---------------+---------+-----------+----------+--------------+   +---------+---------------+---------+-----------+----------+--------------+ LEFT     CompressibilityPhasicitySpontaneityPropertiesThrombus Aging +---------+---------------+---------+-----------+----------+--------------+ CFV      Full           Yes      Yes                                 +---------+---------------+---------+-----------+----------+--------------+ SFJ      Full                                                        +---------+---------------+---------+-----------+----------+--------------+ FV Prox  Full                                                        +---------+---------------+---------+-----------+----------+--------------+ FV Mid   Full                                                        +---------+---------------+---------+-----------+----------+--------------+ FV DistalFull                                                         +---------+---------------+---------+-----------+----------+--------------+ PFV      Full                                                        +---------+---------------+---------+-----------+----------+--------------+ POP      Full           Yes      Yes                                 +---------+---------------+---------+-----------+----------+--------------+ PTV      Full                                                        +---------+---------------+---------+-----------+----------+--------------+ PERO     Full                                                        +---------+---------------+---------+-----------+----------+--------------+    Summary: RIGHT: - Findings consistent with acute deep vein thrombosis involving the right femoral vein, right popliteal vein, right peroneal veins, and right soleal veins. - No cystic structure found  in the popliteal fossa.  LEFT: - There is no evidence of deep vein thrombosis in the lower extremity.  - No cystic structure found in the popliteal fossa.  *See table(s) above for measurements and observations.    Preliminary    CT Angio Chest PE W and/or Wo Contrast  Result Date: 01/15/2022 CLINICAL DATA:  Pulmonary embolus suspected with high probability. Productive cough, low oxygen saturation, recent diagnosis of lung cancer. EXAM: CT ANGIOGRAPHY CHEST WITH CONTRAST TECHNIQUE: Multidetector CT imaging of the chest was performed using the standard protocol during bolus administration of intravenous contrast. Multiplanar CT image reconstructions and MIPs were obtained to evaluate the vascular anatomy. RADIATION DOSE REDUCTION: This exam was performed according to the departmental dose-optimization program which includes automated exposure control, adjustment of the mA and/or kV according to patient size and/or use of iterative reconstruction technique. CONTRAST:  58mL OMNIPAQUE IOHEXOL 350 MG/ML SOLN COMPARISON:  12/30/2021 FINDINGS:  Cardiovascular: Large filling defects are demonstrated in the right main pulmonary artery and extending into upper and lower lobe branches consistent with acute pulmonary embolus. Finding is new since prior study. Cardiac enlargement. No pericardial effusion. Elevated RV to LV ratio at 1.5. Normal caliber thoracic aorta. There is eccentric thrombus in the aortic arch and descending aorta, similar to prior study. Mediastinum/Nodes: Esophagus is decompressed. Lymphadenopathy in the left hilum and left paratracheal region, similar to prior study. Thyroid gland is unremarkable. Lungs/Pleura: Large left apical mass extending to the left hilum and surrounding the left subclavian artery with partially surrounding the left carotid artery origin. Mass measures up to about 7.2 x 8 cm in diameter. This is likely to represent primary lung cancer. There is atelectasis or consolidation in the left upper lung and left lower lung. Moderate left pleural effusion. Patchy peripheral wedge-shaped opacities are demonstrated in the right upper lung, new since prior study, possibly peripheral infarcts. Upper Abdomen: Large left adrenal gland nodule measuring 3.8 cm diameter, unchanged since prior study and likely metastatic. Circumscribed low-attenuation lesion centrally in the liver measuring 3.6 cm diameter is unchanged since prior study, likely representing a benign cyst. Musculoskeletal: No chest wall abnormality. No acute or significant osseous findings. Review of the MIP images confirms the above findings. IMPRESSION: 1. Positive examination for pulmonary embolus with large right main and multiple peripheral pulmonary emboli. Elevated RV to LV ratio at 1.5 suggesting right heart strain. Positive for acute PE with CT evidence of right heart strain (RV/LV Ratio = 1.5) consistent with at least submassive (intermediate risk) PE. The presence of right heart strain has been associated with an increased risk of morbidity and mortality.  Please refer to the "Code PE Focused" order set in EPIC. 2. Again demonstrated is a large left apical/hilar/mediastinal mass lesion likely representing primary lung cancer with lymphadenopathy and direct mediastinal invasion. 3.8 cm left adrenal gland nodule most likely represents a metastasis. 3. Moderate left pleural effusion with atelectasis or infiltration in the left lung. 4. Probable small peripheral infarcts in the right lung. Critical Value/emergent results were called by telephone at the time of interpretation on 01/15/2022 at 1:25 am to provider Tegler, who verbally acknowledged these results. Electronically Signed   By: Lucienne Capers M.D.   On: 01/15/2022 01:37   DG Chest Port 1 View  Result Date: 01/15/2022 CLINICAL DATA:  Shortness of breath, cough with bloody sputum. Recent diagnosis of lung cancer with biopsy. EXAM: PORTABLE CHEST 1 VIEW COMPARISON:  12/25/2021. FINDINGS: The heart size and mediastinal contours are stable. A consolidative  opacity is noted in the left upper lobe at the paramediastinal border and is unchanged from the prior exam. Mild atelectasis is present at the left lung base. The right lung is clear. No effusion or pneumothorax. No acute osseous abnormality. IMPRESSION: 1. Stable mass in the left upper lobe along the paramediastinal border. 2. Mild atelectasis at the left lung base. Electronically Signed   By: Brett Fairy M.D.   On: 01/15/2022 00:12    Medications: I have reviewed the patient's current medications.   Assessment/Plan: This is a very pleasant 76 years old African-American male who was recently diagnosed with a stage IVb (T3, and 3, M1b) non-small cell lung cancer, adenocarcinoma presented with large apical left upper lobe lung mass in addition to mediastinal and left supraclavicular lymphadenopathy and suspicious left adrenal metastasis diagnosed in July 2023. The patient was admitted to the hospital with worsening dyspnea and repeat CT angiogram of the  chest showed evidence for pulmonary embolus with large right main and multiple peripheral pulmonary emboli with evidence of right heart strain consistent with at least submassive PE.  He started treatment with heparin infusion and currently switching to Lovenox 1 Mg/KG twice daily.  I agree with continue with the Lovenox twice daily at least for the first-2 months of his treatment before switching to 1.5 Mg/KG once daily or switching to oral anti-Xa like Eliquis in the future. For the newly diagnosed stage IV non-small cell lung cancer, the patient is supposed to see radiation oncology early next week for consideration of palliative radiotherapy to the large left upper lobe lung mass. He was supposed to have a PET scan yesterday but he was admitted to the hospital and this will be rescheduled after discharge. We will arrange for the patient to have MRI of the brain while he is in the hospital to complete the staging work-up. We already sent blood test to [facility] for molecular studies but the results are still pending. I will arrange for the patient a follow-up appointment with me after discharge from the hospital for more detailed discussion of his treatment options. Thank you so much for taking good care of Mr. Hoare, I will continue to follow-up the patient with you and assist in his management on as-needed basis.    LOS: 1 day    Eilleen Kempf 01/16/2022

## 2022-01-16 NOTE — Progress Notes (Signed)
ANTICOAGULATION CONSULT NOTE - Initial Consult  Pharmacy Consult for Lovenox Indication: pulmonary embolus  Allergies  Allergen Reactions   Sildenafil Other (See Comments)    Caused a headache    Patient Measurements: Height: 5\' 9"  (175.3 cm) Weight: 72.5 kg (159 lb 13.3 oz) IBW/kg (Calculated) : 70.7  Vital Signs: Temp: 97.6 F (36.4 C) (07/22 0400) Temp Source: Axillary (07/22 0400) BP: 158/87 (07/22 0600) Pulse Rate: 86 (07/22 0600)  Labs: Recent Labs    01/14/22 2350 01/15/22 0013 01/15/22 1106 01/15/22 1915 01/15/22 2247 01/16/22 0520  HGB 15.5 15.6  --   --   --  13.3  HCT 45.9 46.0  --   --   --  39.5  PLT 186  --   --   --   --  200  HEPARINUNFRC  --   --  0.40 0.50  --  0.62  CREATININE 1.35* 1.30*  --   --   --  1.08  TROPONINIHS  --   --  859*  --  335* 278*    Estimated Creatinine Clearance: 58.2 mL/min (by C-G formula based on SCr of 1.08 mg/dL).   Medical History: 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[place])    Borderline diabetes    Cataract    bilateral lens implants   Diabetes mellitus without complication (HCC)    HTN (hypertension)    Hyperlipidemia    Insomnia, unspecified    Perirectal abscess 05/10/2013   Personal history of colonic adenomas 04/10/2010   Psychosexual dysfunction with inhibited sexual excitement    Stroke [facility])    Subacute intracranial hemorrhage (HCC)    Unspecified hemorrhoids without mention of complication    Assessment: Active Problem(s): recently diagnosed lung CA s/p lung biopsy 7/19 presents with SOB, cough and bloody sputum.  PMH: HTN, HLD, DMT2, previous bladder cancer, CVA and ICH (02/2019)  AC/Heme: IV hep>LMWH 7/22. CBC WNL 7/20: Chest CT + PE with evidence of right heart strain  Hemoptysis secondary to malignancy +/- PE  Goal of Therapy:  Anti-Xa level 0.6-1 units/ml 4hrs after LMWH dose given Monitor platelets by anticoagulation protocol: Yes   Plan:   D/c IV heparin Start Lovenox 70mg /12 hrs -Monitor hemoptysis - Remain in step down for vitals monitoring Can run copay checks on Monday for LMWH vs Eliquis   Addilynn Mowrer S. Alford Highland, PharmD, BCPS Clinical Staff Pharmacist [email or web address] Alford Highland, Gissella Niblack Stillinger 01/16/2022,8:14 AM

## 2022-01-16 NOTE — Progress Notes (Signed)
[facility] Progress Note Patient Name: Drew Harrington DOB: 10-21-1945 MRN: [id number]   Date of Service  01/16/2022  HPI/Events of Note  K+ 3.2, Cr 1.08  eICU Interventions  KCL 40 meq PO Q 4 hours x 2 doses.        [name] [name] 01/16/2022, 6:24 AM

## 2022-01-16 NOTE — Consult Note (Addendum)
NAME:  Drew Harrington, MRN:  [id number], DOB:  [birth year], LOS: 1 ADMISSION DATE:  01/14/2022, CONSULTATION DATE:  01/15/22 REFERRING MD:  Dr. Marylyn Ishihara, CHIEF COMPLAINT:  SOB   History of Present Illness:  76 year old male with history of HTN, HLD, DMT2, previous bladder cancer, CVA and ICH who presented 7/20 with acute onset of shortness of breath and chest pain with some associated dizziness.  He also reports some hemoptysis which started over a 1.5 weeks ago.  Reports occasional round dark red blood clots he coughs up, last one yesterday.  The amount, color, and frequency has not changed since it started.  Denies any recent fever, chills, lower leg pain or swelling, or syncope.  He is not on home oxygen.   Patient recently found on 12/30/21 with large necrotic left upper lobe mass with mediastinal and left supraclavicular lymphadenopathy and suspicion for left adrenal metastasis after complaints of dry cough, exertional shortness of breath, and recent 20lb weight loss.  He was seen by Dr. Julien Nordmann in office 7/12 with plans to refer to radiation oncology for palliative radiation given left apical lung mass causing thoracic inlet invasion and well as continued workup/ staging; he has not had his MRI brain or PET thus far.  He underwent ENB with Dr. Valeta Harms on 7/19, cytology consistent with non-small cell carcinoma.    In ER, he has been afebrile, tachycardic, tachypneic, RA sat 79% requiring NRB, and has been normo to hypertensive at times.  CTA PE positive for large right main and multiple peripheral pulmonary embolus with probable small peripheral infarcts with RV/LV ratio 1.5, and large left apical/ hilar/ mediastinal mass with lymphadenopathy and mediastinal invasion noted again, moderate left pleural effusion.  Of note, prior CT chest with contrast on 7/5 showed a peripheral filling defect at the bifurcation of the RUL and interlobar arteries indicative of chronic pulmonary embolus.  BNP 359 and Hgb  unchanged at 15.5.  He was placed on heparin gtt and admitted to [facility].  Pulmonary consulted for further recommendations.   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 rate 20, height 5\' 9"  (1.753 m), weight 72.5 kg, SpO2 98 %.    FiO2 (%):  [36 %] 36 %   Intake/Output Summary (Last 24 hours) at 01/16/2022 0728 Last data filed at 01/16/2022 0200 Gross per 24 hour  Intake 306.05 ml  Output 200 ml  Net 106.05 ml    Filed Weights   01/14/22 2342 01/15/22 1746  Weight: 76 kg 72.5 kg   Physical Exam: General: Well-appearing, no acute distress HENT: [place], AT Eyes: EOMI, no scleral icterus Respiratory: Clear to auscultation bilaterally.  No crackles, wheezing or rales Cardiovascular: RRR, -M/R/G, no JVD Extremities:-Edema,-tenderness Neuro: AAO x4, CNII-XII grossly intact Psych: Normal mood, normal affect  LE dopplers 7/21 - acute deep vein thrombosis involving the right  femoral vein, right popliteal vein, right peroneal veins, and right soleal  veins.   Resolved Hospital Problem list    Assessment & Plan:   Acute hypoxemic respiratory failure - requiring 4-6L O2 Submassive pulmonary embolism with cor pulmonale associated with RLE  DVT- very high risk based on PESI Left NSCLC  Left pleural effusion - possible malignancy related. Small window with no amendable pocket. No intervention at this time Hemoptysis secondary to malignancy +/- PE  Due to elevated PESI and echo findings, transfer to progressive for closer vital monitoring. Trend trop and BNP. At this time patient is  hemodynamically stable however in the acute event of decompensation, we discussed risks and benefits of lytic therapy and patient/wife do not want to pursue this option. If clinical status worsens patient/wife want IR consultation for catheter-directed lytics which may be preferred route in setting of hemoptysis and malignancy. We discussed goals of care and patient would not want aggressive measures such as CPR or mechanical life support. Code status changed to DNR.  Remain in step down for vital monitoring Continue heparin gtt. Will discuss long term anticoagulation plan with Oncology Rad Onc consulted -No acute need for treatment. Scheduled 7/26  Remainder per primary team.   Pulmonary team available as needed  Best Practice (right click and "Reselect all SmartList Selections" daily)  Per primary team   Labs   CBC: Recent Labs  Lab 01/14/22 2350 01/15/22 0013 01/16/22 0520  WBC 9.7  --  9.4  NEUTROABS 7.8*  --   --   HGB 15.5 15.6 13.3  HCT 45.9 46.0 39.5  MCV 91.1  --  90.2  PLT 186  --  200     Basic Metabolic Panel: Recent Labs  Lab 01/14/22 2350 01/15/22 0013 01/16/22 0520  NA 140 138 140  K 3.6 3.5 3.2*  CL 98 97* 104  CO2 28  --  26  GLUCOSE 238* 234* 105*  BUN 14 12 12   CREATININE 1.35* 1.30* 1.08  CALCIUM 9.4  --  8.7*    GFR: Estimated Creatinine Clearance: 58.2 mL/min (by C-G formula based on SCr of 1.08 mg/dL). Recent Labs  Lab 01/14/22 2350 01/15/22 1106 01/15/22 2247 01/16/22 0520  WBC 9.7  --   --  9.4  LATICACIDVEN  --  1.5 1.3 1.2     Liver Function Tests: Recent Labs  Lab 01/14/22 2350 01/15/22 1106 01/16/22 0520  AST 35 30 29  ALT 29 25 24   ALKPHOS 182* 163* 142*  BILITOT 0.7 0.2* 0.6  PROT 7.6 7.0 6.1*  ALBUMIN 3.4* 3.1* 2.7*    No results for input(s): "LIPASE", "AMYLASE" in the last 168 hours. No results for input(s): "AMMONIA" in the last 168 hours.  ABG    Component Value Date/Time   TCO2 27 01/15/2022 0013      Coagulation Profile: No results for input(s): "INR", "PROTIME" in the last 168 hours.  Cardiac Enzymes: No results for input(s): "CKTOTAL", "CKMB", "CKMBINDEX", "TROPONINI" in the last 168 hours.  HbA1C: Hemoglobin A1C  Date/Time Value Ref Range Status  11/12/2021 10:57 AM 6.3 (A) 4.0 - 5.6 % Final   Hgb A1c MFr Bld  Date/Time Value Ref Range Status  05/14/2021 09:10 AM 6.4 4.6 - 6.5 % Final    Comment:    Glycemic Control Guidelines for People with Diabetes:Non Diabetic:  <6%Goal of Therapy: <7%Additional Action Suggested:  >8%   11/13/2019 02:18 PM 5.9 4.6 - 6.5 % Final    Comment:    Glycemic Control Guidelines for People with Diabetes:Non Diabetic:  <6%Goal of Therapy: <7%Additional Action Suggested:  >8%     CBG: Recent Labs  Lab 01/13/22 0653 01/15/22 0952 01/15/22 1224 01/15/22 1646 01/15/22 2148  GLUCAP 150* 121* 121* 126* 105*  Review of Systems:   Review of Systems  Constitutional:  Positive for weight loss. Negative for chills and fever.  HENT:  Negative for congestion and nosebleeds.   Respiratory:  Positive for cough, hemoptysis and shortness of breath. Negative for sputum production and wheezing.   Cardiovascular:  Positive for chest pain. Negative for palpitations, orthopnea and leg swelling.  Gastrointestinal:  Negative for abdominal pain, nausea and vomiting.  Neurological:  Negative for loss of consciousness.   Past Medical History:  He,  has a past medical history of Allergic rhinitis, Bladder cancer ([place]), Borderline diabetes, Cataract, Diabetes mellitus without complication ([place]), HTN (hypertension), Hyperlipidemia, Insomnia, unspecified, Perirectal abscess (05/10/2013), Personal history of colonic adenomas (04/10/2010), Psychosexual dysfunction with inhibited sexual excitement, Stroke [facility]), Subacute intracranial hemorrhage ([place]), and Unspecified hemorrhoids without mention of complication.   Surgical History:   Past Surgical History:   Procedure Laterality Date   BRONCHIAL NEEDLE ASPIRATION BIOPSY  01/13/2022   Procedure: BRONCHIAL NEEDLE ASPIRATION BIOPSIES;  Surgeon: Garner Nash, DO;  Location: [place];  Service: Pulmonary;;   CATARACT EXTRACTION W/ INTRAOCULAR LENS  IMPLANT, BILATERAL     COLONOSCOPY     removal cyst hip Left 05/23/2013   TRANSTHORACIC ECHOCARDIOGRAM  04-06-2006   LVSF NORMAL/  EF 60%/ AORTIC ROOT AT UPPER LIMITS OF NORMAL SIZE   TRANSURETHRAL RESECTION OF BLADDER TUMOR  10/08/2011   Procedure: CIRCUMCISION AND TRANSURETHRAL RESECTION OF BLADDER TUMOR (TURBT);  Surgeon: [name], MD;  Location: [facility];  Service: Urology;  Laterality: N/A;   TRANSURETHRAL RESECTION OF BLADDER TUMOR  11/30/2011   Procedure: TRANSURETHRAL RESECTION OF BLADDER TUMOR (TURBT);  Surgeon: [name], MD;  Location: [facility];  Service: Urology;  Laterality: N/A;   VIDEO BRONCHOSCOPY WITH ENDOBRONCHIAL ULTRASOUND Left 01/13/2022   Procedure: VIDEO BRONCHOSCOPY WITH ENDOBRONCHIAL ULTRASOUND;  Surgeon: Garner Nash, DO;  Location: [place];  Service: Pulmonary;  Laterality: Left;     Social History:   reports that he has quit smoking. His smokeless tobacco use includes chew. He reports that he does not drink alcohol and does not use drugs.   Family History:  His family history includes Cancer in his father, sister, sister, and sister; Gout in an other family member; Heart disease in an other family member; Hypertension in an other family member; Lung disease in his father. There is no history of Colon cancer, Esophageal cancer, Stomach cancer, or Rectal cancer.   Allergies Allergies  Allergen Reactions   Sildenafil Other (See Comments)    Caused a headache     Home Medications  Prior to Admission medications   Medication Sig Start Date End Date Taking? Authorizing Provider  donepezil (ARICEPT) 5 MG tablet Take 5 mg by mouth at bedtime.   Yes  [provider]  losartan-hydrochlorothiazide (HYZAAR) 100-25 MG tablet TAKE 1 TABLET BY MOUTH DAILY 01/12/22  Yes Hoyt Koch, MD  metFORMIN (GLUCOPHAGE) 1000 MG tablet Take 1 tablet (1,000 mg total) by mouth daily with breakfast. 11/11/20  Yes Hoyt Koch, MD  metoprolol succinate (TOPROL-XL) 25 MG 24 hr tablet TAKE 1 TABLET(25 MG) BY MOUTH DAILY Patient taking differently: Take 25 mg by mouth daily. 09/21/21  Yes Hoyt Koch, MD  potassium chloride SA (KLOR-CON) 20 MEQ tablet Take 1 tablet (20 mEq total) by mouth daily. 11/11/20  Yes Hoyt Koch, MD  primidone (MYSOLINE) 250 MG tablet Take 250 mg by mouth See admin instructions. 2 tablets in AM and  3 tablets at bedtime 11/07/18  Yes [provider]  simvastatin (ZOCOR) 20 MG tablet TAKE 1 TABLET(20 MG) BY MOUTH AT BEDTIME Patient taking differently: Take 20 mg by mouth at bedtime. 07/02/21  Yes Janith Lima, MD     Critical care time: n/a    Care Time: 21 min  Rodman Pickle, M.D. [facility] Pulmonary/Critical Care Medicine 01/16/2022 7:41 AM   See Amion for personal pager For hours between 7 PM to 7 AM, please call Elink for urgent questions

## 2022-01-17 DIAGNOSIS — I2699 Other pulmonary embolism without acute cor pulmonale: Secondary | ICD-10-CM | POA: Diagnosis not present

## 2022-01-17 DIAGNOSIS — I82411 Acute embolism and thrombosis of right femoral vein: Secondary | ICD-10-CM | POA: Diagnosis not present

## 2022-01-17 DIAGNOSIS — I10 Essential (primary) hypertension: Secondary | ICD-10-CM | POA: Diagnosis not present

## 2022-01-17 DIAGNOSIS — R918 Other nonspecific abnormal finding of lung field: Secondary | ICD-10-CM | POA: Diagnosis not present

## 2022-01-17 LAB — CBC
HCT: 42.3 % (ref 39.0–52.0)
Hemoglobin: 14.2 g/dL (ref 13.0–17.0)
MCH: 30.3 pg (ref 26.0–34.0)
MCHC: 33.6 g/dL (ref 30.0–36.0)
MCV: 90.2 fL (ref 80.0–100.0)
Platelets: 235 10*3/uL (ref 150–400)
RBC: 4.69 MIL/uL (ref 4.22–5.81)
RDW: 13.2 % (ref 11.5–15.5)
WBC: 9.4 10*3/uL (ref 4.0–10.5)
nRBC: 0 % (ref 0.0–0.2)

## 2022-01-17 LAB — BASIC METABOLIC PANEL
Anion gap: 9 (ref 5–15)
BUN: 12 mg/dL (ref 8–23)
CO2: 24 mmol/L (ref 22–32)
Calcium: 8.8 mg/dL — ABNORMAL LOW (ref 8.9–10.3)
Chloride: 106 mmol/L (ref 98–111)
Creatinine, Ser: 1.03 mg/dL (ref 0.61–1.24)
GFR, Estimated: >60 mL/min (ref >60)
Glucose, Bld: 100 mg/dL — ABNORMAL HIGH (ref 70–99)
Potassium: 3.7 mmol/L (ref 3.5–5.1)
Sodium: 139 mmol/L (ref 135–145)

## 2022-01-17 LAB — GLUCOSE, CAPILLARY
Glucose-Capillary: 103 mg/dL — ABNORMAL HIGH (ref 70–99)
Glucose-Capillary: 172 mg/dL — ABNORMAL HIGH (ref 70–99)
Glucose-Capillary: 153 mg/dL — ABNORMAL HIGH (ref 70–99)
Glucose-Capillary: 112 mg/dL — ABNORMAL HIGH (ref 70–99)

## 2022-01-17 MED ORDER — POTASSIUM CHLORIDE CRYS ER 20 MEQ PO TBCR
40.0000 meq | EXTENDED_RELEASE_TABLET | Freq: Once | ORAL | Status: AC
  Administered 2022-01-17: 40 meq via ORAL
  Filled 2022-01-17: qty 2

## 2022-01-17 MED ORDER — METOPROLOL SUCCINATE ER 25 MG PO TB24
25.0000 mg | ORAL_TABLET | Freq: Two times a day (BID) | ORAL | Status: DC
  Administered 2022-01-17 – 2022-01-20 (×6): 25 mg via ORAL
  Filled 2022-01-17 (×6): qty 1

## 2022-01-17 MED ORDER — GUAIFENESIN-DM 100-10 MG/5ML PO SYRP
10.0000 mL | ORAL_SOLUTION | ORAL | Status: DC | PRN
  Administered 2022-01-17 – 2022-01-18 (×3): 10 mL via ORAL
  Filled 2022-01-17 (×3): qty 10

## 2022-01-17 NOTE — Progress Notes (Signed)
[facility] ADULT ICU REPLACEMENT PROTOCOL   The patient does apply for the [facility] Adult ICU Electrolyte Replacment Protocol based on the criteria listed below:   1.Exclusion criteria: TCTS patients, ECMO patients, and Dialysis patients 2. Is GFR >/= 30 ml/min? Yes.    Patient's GFR today is >60  3. Is SCr </= 2? Yes.   Patient's SCr is 1.03 mg/dL 4. Did SCr increase >/= 0.5 in 24 hours? No. 5.Pt's weight >40kg  Yes.   6. Abnormal electrolyte(s): K  7. Electrolytes replaced per protocol 8.  Call MD STAT for K+ </= 2.5, Phos </= 1, or Mag </= 1 Physician:  Luci Bank [facility] 01/17/2022 5:07 AM

## 2022-01-17 NOTE — Progress Notes (Signed)
PROGRESS NOTE    Drew Harrington  VQM:[id number] DOB: 1945/09/13 DOA: 01/14/2022 PCP: Hoyt Koch, MD    Brief Narrative:  76 year old male with a history of hypertension, diabetes, hyperlipidemia, recently diagnosed non-small cell lung cancer/adenocarcinoma, presents with acute onset of shortness of breath.  Found to have acute pulmonary embolism with acute cor pulmonale.  Also noted to have right lower extremity DVT.  Admitted to the hospital and started on anticoagulation.  PCCM, oncology following.   Assessment & Plan:   Principal Problem:   Acute pulmonary embolism with acute cor pulmonale (HCC) Active Problems:   HLD (hyperlipidemia)   HTN (hypertension)   DM2 (diabetes mellitus, type 2) (HCC)   Mass of left lung   Hemoptysis   Chest pain   Stage 3a chronic kidney disease (CKD) (HCC)   Right leg DVT (HCC)   Acute pulmonary embolism with acute cor pulmonale Acute right lower extremity DVT -Initially started on heparin infusion, subsequently transition to Lovenox -Per oncology, will need to discharge with Lovenox therapy for at least the next 2 months -Echo shows that RV systolic function is severely reduced.  RV size is normal.  LVEF is normal. -Continue to wean down oxygen as tolerated -Continue to monitor in stepdown  Acute respiratory failure with hypoxia -Secondary to acute pulmonary embolus -Currently on 12 L of oxygen -Wean down oxygen as tolerated  Elevated troponin -Secondary to demand ischemia in the setting of acute PE  Non-small cell lung cancer -Recent diagnosis -Being followed by oncology for further work-up -Plans are for outpatient PET scan -MRI brain did not show any evidence of metastatic disease, although this was a noncontrast study  Hypokalemia -Replace  Diabetes type 2 -Metformin currently on hold -Continue on sliding scale insulin  Hypertension -Blood pressures are running high -On Toprol and losartan -Increase Toprol  dosing  CKD stage IIIa -Creatinine is currently at baseline  Hyperlipidemia -Continue statin   DVT prophylaxis: therapeutic lovenox  Code Status: DNR Family Communication: discussed with patient Disposition Plan: Status is: Inpatient Remains inpatient appropriate because: Continued anticoagulation for massive PE and cor pulmonale     Consultants:  PCCM Oncology  Procedures:  Echo  Antimicrobials:      Subjective: He says he does not feel short of breath.  He is coughing.  Still requiring high flow nasal cannula oxygen.  Objective: Vitals:   01/17/22 0500 01/17/22 0600 01/17/22 0755 01/17/22 0800  BP: (!) 150/100 (!) 178/101  (!) 151/87  Pulse: 87 88  (!) 103  Resp: 20 20  (!) 25  Temp:   97.9 F (36.6 C) 97.9 F (36.6 C)  TempSrc:   Axillary Axillary  SpO2: 100% 100%  96%  Weight:      Height:        Intake/Output Summary (Last 24 hours) at 01/17/2022 1037 Last data filed at 01/16/2022 2200 Gross per 24 hour  Intake 23.83 ml  Output 600 ml  Net -576.17 ml   Filed Weights   01/14/22 2342 01/15/22 1746  Weight: 76 kg 72.5 kg    Examination:  General exam: Appears calm and comfortable  Respiratory system: Clear to auscultation. Respiratory effort normal. Cardiovascular system: S1 & S2 heard, RRR. No JVD, murmurs, rubs, gallops or clicks. No pedal edema. Gastrointestinal system: Abdomen is nondistended, soft and nontender. No organomegaly or masses felt. Normal bowel sounds heard. Central nervous system: Alert and oriented. No focal neurological deficits. Extremities: Symmetric 5 x 5 power. Skin: No rashes, lesions or ulcers  Psychiatry: Judgement and insight appear normal. Mood & affect appropriate.     Data Reviewed: I have personally reviewed following labs and imaging studies  CBC: Recent Labs  Lab 01/14/22 2350 01/15/22 0013 01/16/22 0520 01/17/22 0317  WBC 9.7  --  9.4 9.4  NEUTROABS 7.8*  --   --   --   HGB 15.5 15.6 13.3 14.2  HCT  45.9 46.0 39.5 42.3  MCV 91.1  --  90.2 90.2  PLT 186  --  200 102   Basic Metabolic Panel: Recent Labs  Lab 01/14/22 2350 01/15/22 0013 01/16/22 0520 01/17/22 0317  NA 140 138 140 139  K 3.6 3.5 3.2* 3.7  CL 98 97* 104 106  CO2 28  --  26 24  GLUCOSE 238* 234* 105* 100*  BUN 14 12 12 12   CREATININE 1.35* 1.30* 1.08 1.03  CALCIUM 9.4  --  8.7* 8.8*   GFR: Estimated Creatinine Clearance: 61 mL/min (by C-G formula based on SCr of 1.03 mg/dL). Liver Function Tests: Recent Labs  Lab 01/14/22 2350 01/15/22 1106 01/16/22 0520  AST 35 30 29  ALT 29 25 24   ALKPHOS 182* 163* 142*  BILITOT 0.7 0.2* 0.6  PROT 7.6 7.0 6.1*  ALBUMIN 3.4* 3.1* 2.7*   No results for input(s): "LIPASE", "AMYLASE" in the last 168 hours. No results for input(s): "AMMONIA" in the last 168 hours. Coagulation Profile: No results for input(s): "INR", "PROTIME" in the last 168 hours. Cardiac Enzymes: No results for input(s): "CKTOTAL", "CKMB", "CKMBINDEX", "TROPONINI" in the last 168 hours. BNP (last 3 results) No results for input(s): "PROBNP" in the last 8760 hours. HbA1C: No results for input(s): "HGBA1C" in the last 72 hours. CBG: Recent Labs  Lab 01/16/22 0736 01/16/22 1217 01/16/22 1604 01/16/22 2127 01/17/22 0741  GLUCAP 86 115* 94 94 103*   Lipid Profile: No results for input(s): "CHOL", "HDL", "LDLCALC", "TRIG", "CHOLHDL", "LDLDIRECT" in the last 72 hours. Thyroid Function Tests: No results for input(s): "TSH", "T4TOTAL", "FREET4", "T3FREE", "THYROIDAB" in the last 72 hours. Anemia Panel: No results for input(s): "VITAMINB12", "FOLATE", "FERRITIN", "TIBC", "IRON", "RETICCTPCT" in the last 72 hours. Sepsis Labs: Recent Labs  Lab 01/15/22 1106 01/15/22 2247 01/16/22 0520 01/16/22 1039  LATICACIDVEN 1.5 1.3 1.2 1.1    Recent Results (from the past 240 hour(s))  SARS Coronavirus 2 by RT PCR (hospital order, performed in [facility] hospital lab) *cepheid single result test*  Anterior Nasal Swab     Status: None   Collection Time: 01/13/22  6:39 AM   Specimen: Anterior Nasal Swab  Result Value Ref Range Status   SARS Coronavirus 2 by RT PCR NEGATIVE NEGATIVE Final    Comment: (NOTE) SARS-CoV-2 target nucleic acids are NOT DETECTED.  The SARS-CoV-2 RNA is generally detectable in upper and lower respiratory specimens during the acute phase of infection. The lowest concentration of SARS-CoV-2 viral copies this assay can detect is 250 copies / mL. A negative result does not preclude SARS-CoV-2 infection and should not be used as the sole basis for treatment or other patient management decisions.  A negative result may occur with improper specimen collection / handling, submission of specimen other than nasopharyngeal swab, presence of viral mutation(s) within the areas targeted by this assay, and inadequate number of viral copies (<250 copies / mL). A negative result must be combined with clinical observations, patient history, and epidemiological information.  Fact Sheet for Patients:   https://www.patel.info/  Fact Sheet for Healthcare Providers: https://hall.com/  This  test is not yet approved or  cleared by the [place] and has been authorized for detection and/or diagnosis of SARS-CoV-2 by FDA under an Emergency Use Authorization (EUA).  This EUA will remain in effect (meaning this test can be used) for the duration of the COVID-19 declaration under Section 564(b)(1) of the Act, 21 U.S.C. section 360bbb-3(b)(1), unless the authorization is terminated or revoked sooner.  Performed at [facility], [place] [address] [place], [place] [address]   Resp Panel by RT-PCR (Flu A&B, Covid) Anterior Nasal Swab     Status: None   Collection Time: 01/14/22 11:50 PM   Specimen: Anterior Nasal Swab  Result Value Ref Range Status   SARS Coronavirus 2 by RT PCR NEGATIVE NEGATIVE Final    Comment:  (NOTE) SARS-CoV-2 target nucleic acids are NOT DETECTED.  The SARS-CoV-2 RNA is generally detectable in upper respiratory specimens during the acute phase of infection. The lowest concentration of SARS-CoV-2 viral copies this assay can detect is 138 copies/mL. A negative result does not preclude SARS-Cov-2 infection and should not be used as the sole basis for treatment or other patient management decisions. A negative result may occur with  improper specimen collection/handling, submission of specimen other than nasopharyngeal swab, presence of viral mutation(s) within the areas targeted by this assay, and inadequate number of viral copies(<138 copies/mL). A negative result must be combined with clinical observations, patient history, and epidemiological information. The expected result is Negative.  Fact Sheet for Patients:  EntrepreneurPulse.com.au  Fact Sheet for Healthcare Providers:  IncredibleEmployment.be  This test is no t yet approved or cleared by the [place] FDA and  has been authorized for detection and/or diagnosis of SARS-CoV-2 by FDA under an Emergency Use Authorization (EUA). This EUA will remain  in effect (meaning this test can be used) for the duration of the COVID-19 declaration under Section 564(b)(1) of the Act, 21 U.S.C.section 360bbb-3(b)(1), unless the authorization is terminated  or revoked sooner.       Influenza A by PCR NEGATIVE NEGATIVE Final   Influenza B by PCR NEGATIVE NEGATIVE Final    Comment: (NOTE) The Xpert Xpress SARS-CoV-2/FLU/RSV plus assay is intended as an aid in the diagnosis of influenza from Nasopharyngeal swab specimens and should not be used as a sole basis for treatment. Nasal washings and aspirates are unacceptable for Xpert Xpress SARS-CoV-2/FLU/RSV testing.  Fact Sheet for Patients: EntrepreneurPulse.com.au  Fact Sheet for Healthcare  Providers: IncredibleEmployment.be  This test is not yet approved or cleared by the [place] FDA and has been authorized for detection and/or diagnosis of SARS-CoV-2 by FDA under an Emergency Use Authorization (EUA). This EUA will remain in effect (meaning this test can be used) for the duration of the COVID-19 declaration under Section 564(b)(1) of the Act, 21 U.S.C. section 360bbb-3(b)(1), unless the authorization is terminated or revoked.  Performed at [facility], [place] [address], [place], [place] [address]   MRSA Next Gen by PCR, Nasal     Status: None   Collection Time: 01/15/22  5:39 PM   Specimen: Nasal Mucosa; Nasal Swab  Result Value Ref Range Status   MRSA by PCR Next Gen NOT DETECTED NOT DETECTED Final    Comment: (NOTE) The GeneXpert MRSA Assay (FDA approved for NASAL specimens only), is one component of a comprehensive MRSA colonization surveillance program. It is not intended to diagnose MRSA infection nor to guide or monitor treatment for MRSA infections. Test performance is not FDA approved  in patients less than 15 years old. Performed at [facility], [place] [address], [place], [place] [address]          Radiology Studies: MR BRAIN WO CONTRAST  Result Date: 01/16/2022 CLINICAL DATA:  Non-small cell lung cancer staging EXAM: MRI HEAD WITHOUT CONTRAST TECHNIQUE: Multiplanar, multiecho pulse sequences of the brain and surrounding structures were obtained without intravenous contrast. COMPARISON:  Head CT 12/30/2021 FINDINGS: Brain: No swelling or masslike finding to suggest metastatic disease. Chronic small vessel ischemic gliosis in the cerebral white matter with chronic perforator infarct at the left basal ganglia. Chronic lacunar infarcts in the bilateral thalami with numerous remote micro hemorrhages in the deep brain. Small remote left occipital and right parietal cortex infarcts. Wallerian changes  seen in the bilateral middle cerebellar peduncle associated with a chronic right pontine lacune. No acute hemorrhage, acute infarct, hydrocephalus, or collection. Vascular: Major flow voids are preserved Skull and upper cervical spine: Normal marrow signal Sinuses/Orbits: Negative IMPRESSION: 1. Limited for staging purposes due to lack of IV contrast. No gross masslike features. 2. Advanced chronic small vessel ischemia. Electronically Signed   By: Jorje Guild M.D.   On: 01/16/2022 12:07   VAS [place] LOWER EXTREMITY VENOUS (DVT)  Result Date: 01/16/2022  Lower Venous DVT Study Patient Name:  LESHAWN STRAKA  Date of Exam:   01/15/2022 Medical Rec #: [id number]          Accession #:    [id number] Date of Birth: 02-27-1946           Patient Gender: M Patient Age:   40 years Exam Location:  [facility] Procedure:      VAS [place] LOWER EXTREMITY VENOUS (DVT) Referring Phys: Nevin Bloodgood SIMPSON --------------------------------------------------------------------------------  Indications: Pulmonary embolism.  Risk Factors: Confirmed PE. Anticoagulation: Heparin. Comparison Study: No prior studies. Performing Technologist: Oliver Hum RVT  Examination Guidelines: A complete evaluation includes B-mode imaging, spectral Doppler, color Doppler, and power Doppler as needed of all accessible portions of each vessel. Bilateral testing is considered an integral part of a complete examination. Limited examinations for reoccurring indications may be performed as noted. The reflux portion of the exam is performed with the patient in reverse Trendelenburg.  +---------+---------------+---------+-----------+----------+--------------+ RIGHT    CompressibilityPhasicitySpontaneityPropertiesThrombus Aging +---------+---------------+---------+-----------+----------+--------------+ CFV      Full           Yes      Yes                                  +---------+---------------+---------+-----------+----------+--------------+ SFJ      Full                                                        +---------+---------------+---------+-----------+----------+--------------+ FV Prox  Full                                                        +---------+---------------+---------+-----------+----------+--------------+ FV Mid   Full                                                        +---------+---------------+---------+-----------+----------+--------------+  FV DistalPartial        Yes      No                   Acute          +---------+---------------+---------+-----------+----------+--------------+ PFV      Full                                                        +---------+---------------+---------+-----------+----------+--------------+ POP      None           No       No                   Acute          +---------+---------------+---------+-----------+----------+--------------+ PTV      Full                                                        +---------+---------------+---------+-----------+----------+--------------+ PERO     Partial                                      Acute          +---------+---------------+---------+-----------+----------+--------------+ Soleal   Partial                                      Acute          +---------+---------------+---------+-----------+----------+--------------+   +---------+---------------+---------+-----------+----------+--------------+ LEFT     CompressibilityPhasicitySpontaneityPropertiesThrombus Aging +---------+---------------+---------+-----------+----------+--------------+ CFV      Full           Yes      Yes                                 +---------+---------------+---------+-----------+----------+--------------+ SFJ      Full                                                         +---------+---------------+---------+-----------+----------+--------------+ FV Prox  Full                                                        +---------+---------------+---------+-----------+----------+--------------+ FV Mid   Full                                                        +---------+---------------+---------+-----------+----------+--------------+ FV DistalFull                                                        +---------+---------------+---------+-----------+----------+--------------+  PFV      Full                                                        +---------+---------------+---------+-----------+----------+--------------+ POP      Full           Yes      Yes                                 +---------+---------------+---------+-----------+----------+--------------+ PTV      Full                                                        +---------+---------------+---------+-----------+----------+--------------+ PERO     Full                                                        +---------+---------------+---------+-----------+----------+--------------+     Summary: RIGHT: - Findings consistent with acute deep vein thrombosis involving the right femoral vein, right popliteal vein, right peroneal veins, and right soleal veins. - No cystic structure found in the popliteal fossa.  LEFT: - There is no evidence of deep vein thrombosis in the lower extremity.  - No cystic structure found in the popliteal fossa.  *See table(s) above for measurements and observations. Electronically signed by Orlie Pollen on 01/16/2022 at 12:00:16 PM.    Final    ECHOCARDIOGRAM COMPLETE  Result Date: 01/15/2022    ECHOCARDIOGRAM REPORT   Patient Name:   ALYXANDER KOLLMANN Date of Exam: 01/15/2022 Medical Rec #:  [id number]         Height:       69.0 in Accession #:    [id number]        Weight:       167.5 lb Date of Birth:  Mar 22, 1946          BSA:          1.916 m  Patient Age:    29 years          BP:           125/97 mmHg Patient Gender: M                 HR:           93 bpm. Exam Location:  Inpatient Procedure: 2D Echo, Cardiac Doppler and Color Doppler Indications:    Pulmonary embolus  History:        Patient has prior history of Echocardiogram examinations. Risk                 Factors:Hypertension and Diabetes.  Sonographer:    Jyl Heinz Referring Phys: [id number] [place]  1. Cor pulmonale with severe TR and RV dysfucntion in setting of clinical PE.  2. Left ventricular ejection fraction, by estimation, is 55%. The left ventricle has normal function. The left ventricle has no regional wall motion abnormalities. Left ventricular diastolic  parameters are consistent with Grade I diastolic dysfunction (impaired relaxation).  3. Right ventricular systolic function is severely reduced. The right ventricular size is normal.  4. The mitral valve is abnormal. Trivial mitral valve regurgitation. No evidence of mitral stenosis.  5. Tricuspid valve regurgitation is severe.  6. The aortic valve is tricuspid. There is mild calcification of the aortic valve. There is mild thickening of the aortic valve. Aortic valve regurgitation is not visualized. Aortic valve sclerosis is present, with no evidence of aortic valve stenosis.  7. The inferior vena cava is dilated in size with >50% respiratory variability, suggesting right atrial pressure of 8 mmHg. FINDINGS  Left Ventricle: Left ventricular ejection fraction, by estimation, is 55%. The left ventricle has normal function. The left ventricle has no regional wall motion abnormalities. The left ventricular internal cavity size was normal in size. There is no left ventricular hypertrophy. Left ventricular diastolic parameters are consistent with Grade I diastolic dysfunction (impaired relaxation). Right Ventricle: The right ventricular size is normal. No increase in right ventricular wall thickness. Right ventricular  systolic function is severely reduced. Left Atrium: Left atrial size was normal in size. Right Atrium: Right atrial size was normal in size. Pericardium: There is no evidence of pericardial effusion. Mitral Valve: The mitral valve is abnormal. There is mild thickening of the mitral valve leaflet(s). There is mild calcification of the mitral valve leaflet(s). Trivial mitral valve regurgitation. No evidence of mitral valve stenosis. Tricuspid Valve: The tricuspid valve is normal in structure. Tricuspid valve regurgitation is severe. No evidence of tricuspid stenosis. Aortic Valve: The aortic valve is tricuspid. There is mild calcification of the aortic valve. There is mild thickening of the aortic valve. Aortic valve regurgitation is not visualized. Aortic valve sclerosis is present, with no evidence of aortic valve stenosis. Aortic valve peak gradient measures 3.3 mmHg. Pulmonic Valve: The pulmonic valve was normal in structure. Pulmonic valve regurgitation is trivial. No evidence of pulmonic stenosis. Aorta: The aortic root is normal in size and structure. Venous: The inferior vena cava is dilated in size with greater than 50% respiratory variability, suggesting right atrial pressure of 8 mmHg. IAS/Shunts: No atrial level shunt detected by color flow Doppler. 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  LEFT VENTRICLE PLAX 2D LVIDd:         3.60 cm     Diastology LVIDs:         2.30 cm     LV e' medial:    5.33 cm/s LV PW:         1.30 cm     LV E/e' medial:  14.5 LV IVS:        1.40 cm     LV e' lateral:   5.98 cm/s LVOT diam:     2.00 cm     LV E/e' lateral: 12.9 LV SV:         40 LV SV Index:   21 LVOT Area:     3.14 cm  LV Volumes (MOD) LV vol d, MOD A2C: 76.4 ml LV vol d, MOD A4C: 72.7 ml LV vol s, MOD A2C: 28.5 ml LV vol s, MOD A4C: 30.2 ml LV SV MOD A2C:     47.9 ml LV SV MOD A4C:     72.7 ml LV SV MOD BP:      43.1 ml RIGHT VENTRICLE            IVC RV  Basal diam:  3.70 cm    IVC diam: 1.90 cm RV Mid diam:    2.70 cm RV S prime:     8.27 cm/s TAPSE (M-mode): 1.8 cm LEFT ATRIUM           Index        RIGHT ATRIUM           Index LA diam:      2.70 cm 1.41 cm/m   RA Area:     18.20 cm LA Vol (A2C): 26.0 ml 13.57 ml/m  RA Volume:   54.90 ml  28.65 ml/m LA Vol (A4C): 38.7 ml 20.20 ml/m  AORTIC VALVE                 PULMONIC VALVE AV Area (Vmax): 2.87 cm     PR End Diast Vel: 1.41 msec AV Vmax:        91.50 cm/s AV Peak Grad:   3.3 mmHg LVOT Vmax:      83.60 cm/s LVOT Vmean:     66.400 cm/s LVOT VTI:       0.126 m  AORTA Ao Root diam: 3.60 cm Ao Asc diam:  3.70 cm MITRAL VALVE               TRICUSPID VALVE MV Area (PHT): 10.54 cm   TR Peak grad:   41.7 mmHg MV Decel Time: 72 msec     TR Vmax:        323.00 cm/s MV E velocity: 77.10 cm/s MV A velocity: 76.30 cm/s  SHUNTS MV E/A ratio:  1.01        Systemic VTI:  0.13 m                            Systemic Diam: 2.00 cm Jenkins Rouge MD Electronically signed by Jenkins Rouge MD Signature Date/Time: 01/15/2022/2:01:41 PM    Final         Scheduled Meds:  Chlorhexidine Gluconate Cloth  6 each Topical Daily   dextromethorphan-guaiFENesin  1 tablet Oral BID   donepezil  5 mg Oral QHS   enoxaparin (LOVENOX) injection  70 mg Subcutaneous Q12H   feeding supplement  237 mL Oral BID BM   insulin aspart  0-9 Units Subcutaneous TID WC   losartan  100 mg Oral Daily   metoprolol succinate  25 mg Oral Daily   mouth rinse  15 mL Mouth Rinse 4 times per day   primidone  500 mg Oral Daily   primidone  750 mg Oral QHS   simvastatin  20 mg Oral QHS   Continuous Infusions:   LOS: 2 days    Time spent: 37mins    Kathie Dike, MD [facility]   If 7PM-7AM, please contact night-coverage [email or web address]  01/17/2022, 10:37 AM

## 2022-01-18 ENCOUNTER — Telehealth (HOSPITAL_COMMUNITY): Payer: Self-pay | Admitting: Pharmacy Technician

## 2022-01-18 ENCOUNTER — Other Ambulatory Visit (HOSPITAL_COMMUNITY): Payer: Self-pay

## 2022-01-18 LAB — GLUCOSE, CAPILLARY
Glucose-Capillary: 120 mg/dL — ABNORMAL HIGH (ref 70–99)
Glucose-Capillary: 169 mg/dL — ABNORMAL HIGH (ref 70–99)
Glucose-Capillary: 121 mg/dL — ABNORMAL HIGH (ref 70–99)

## 2022-01-18 NOTE — Evaluation (Signed)
Physical Therapy Evaluation Patient Details Name: Drew Harrington MRN: [id number] DOB: 10-04-1945 Today's Date: 01/18/2022  History of Present Illness  76 yo male admitted with acute PE with R heart strain, LE DVT. Hx of DM, lung cancer  Clinical Impression  On eval, pt was Min guard A for mobility. He walked ~350 feet with a RW. O2 93% on RA at rest, 88% on RA while ambulating. Pt denied dyspnea. He tolerated ambulation distance well. Pt currently presents with general weakness and impaired gait and balance. Discussed d/c plan-he will return home where he lives with his wife. PT recommendation is for HHPT f/u.        Recommendations for follow up therapy are one component of a multi-disciplinary discharge planning process, led by the attending physician.  Recommendations may be updated based on patient status, additional functional criteria and insurance authorization.  Follow Up Recommendations Home health PT      Assistance Recommended at Discharge PRN  Patient can return home with the following  A lot of help with walking and/or transfers;Assist for transportation;Assistance with cooking/housework    Equipment Recommendations Rolling walker (2 wheels)  Recommendations for Other Services       Functional Status Assessment Patient has had a recent decline in their functional status and demonstrates the ability to make significant improvements in function in a reasonable and predictable amount of time.     Precautions / Restrictions Precautions Precautions: Fall Precaution Comments: monitor O2 Restrictions Weight Bearing Restrictions: No      Mobility  Bed Mobility Overal bed mobility: Modified Independent                  Transfers Overall transfer level: Needs assistance Equipment used: None, Rolling walker (2 wheels) Transfers: Sit to/from Stand             General transfer comment: x2. Supv for safety. Cues for hand placement     Ambulation/Gait Ambulation/Gait assistance: Min assist, Min guard Gait Distance (Feet): 350 Feet (350'x1' 250'x1) Assistive device: Rolling walker (2 wheels), None Gait Pattern/deviations: Step-through pattern, Decreased stride length       General Gait Details: Intermittent Min A to steady when walking without a device. Supv when walking with RW. O2 88-89% on RA. Pt denied dizziness  Stairs            Wheelchair Mobility    Modified Rankin (Stroke Patients Only)       Balance Overall balance assessment: Mild deficits observed, not formally tested                                           Pertinent Vitals/Pain Pain Assessment Pain Assessment: No/denies pain    Home Living Family/patient expects to be discharged to:: Private residence Living Arrangements: Spouse/significant other Available Help at Discharge: Family Type of Home: House Home Access: Stairs to enter Entrance Stairs-Rails: Psychiatric nurse of Steps: 3 Alternate Level Stairs-Number of Steps: 7 Home Layout: Able to live on main level with bedroom/bathroom;Multi-level Home Equipment: None      Prior Function Prior Level of Function : Independent/Modified Independent                     Hand Dominance        Extremity/Trunk Assessment   Upper Extremity Assessment Upper Extremity Assessment: Overall WFL for tasks assessed    Lower  Extremity Assessment Lower Extremity Assessment: Generalized weakness    Cervical / Trunk Assessment Cervical / Trunk Assessment: Normal  Communication   Communication: No difficulties  Cognition Arousal/Alertness: Awake/alert Behavior During Therapy: WFL for tasks assessed/performed Overall Cognitive Status: Within Functional Limits for tasks assessed                                          General Comments      Exercises     Assessment/Plan    PT Assessment Patient needs continued PT  services  PT Problem List Decreased strength;Decreased balance;Decreased activity tolerance;Decreased mobility;Decreased knowledge of use of DME       PT Treatment Interventions DME instruction;Gait training;Functional mobility training;Therapeutic activities;Balance training;Patient/family education;Therapeutic exercise    PT Goals (Current goals can be found in the Care Plan section)  Acute Rehab PT Goals Patient Stated Goal: home! PT Goal Formulation: With patient/family Time For Goal Achievement: 02/01/22 Potential to Achieve Goals: Good    Frequency Min 3X/week     Co-evaluation               AM-PAC PT "6 Clicks" Mobility  Outcome Measure Help needed turning from your back to your side while in a flat bed without using bedrails?: A Little Help needed moving from lying on your back to sitting on the side of a flat bed without using bedrails?: A Little Help needed moving to and from a bed to a chair (including a wheelchair)?: A Little Help needed standing up from a chair using your arms (e.g., wheelchair or bedside chair)?: A Little Help needed to walk in hospital room?: A Little Help needed climbing 3-5 steps with a railing? : A Little 6 Click Score: 18    End of Session Equipment Utilized During Treatment: Gait belt Activity Tolerance: Patient tolerated treatment well Patient left: in chair;with call bell/phone within reach;with family/visitor present   PT Visit Diagnosis: Unsteadiness on feet (R26.81);Muscle weakness (generalized) (M62.81)    Time: 0865-7846 PT Time Calculation (min) (ACUTE ONLY): 24 min   Charges:   PT Evaluation $PT Eval Low Complexity: 1 Low PT Treatments $Gait Training: 8-22 mins           Doreatha Massed, PT Acute Rehabilitation  Office: [phone] Pager: [phone]

## 2022-01-18 NOTE — Progress Notes (Signed)
PROGRESS NOTE    Drew Harrington  DGU:[id number] DOB: 1946/06/19 DOA: 01/14/2022 PCP: Hoyt Koch, MD    Brief Narrative:  76 year old male with a history of hypertension, diabetes, hyperlipidemia, recently diagnosed non-small cell lung cancer/adenocarcinoma, presents with acute onset of shortness of breath.  Found to have acute pulmonary embolism with acute cor pulmonale.  Also noted to have right lower extremity DVT.  Admitted to the hospital and started on anticoagulation.  PCCM, oncology following.   Assessment & Plan:   Principal Problem:   Acute pulmonary embolism with acute cor pulmonale (HCC) Active Problems:   HLD (hyperlipidemia)   HTN (hypertension)   DM2 (diabetes mellitus, type 2) (HCC)   Mass of left lung   Hemoptysis   Chest pain   Stage 3a chronic kidney disease (CKD) (HCC)   Right leg DVT (HCC)   Acute pulmonary embolism with acute cor pulmonale Acute right lower extremity DVT -Initially started on heparin infusion, subsequently transition to Lovenox -Per oncology, will need to discharge with Lovenox therapy for at least the next 2 months -Echo shows that RV systolic function is severely reduced.  RV size is normal.  LVEF is normal. -Continue to wean down oxygen as tolerated -Patient had benefits check performed and cost of Lovenox will be approximately $100 per month -Discussed with patient's wife and she feels that this may be cost prohibitive -We will ask for TOC to check benefits on Eliquis/Xarelto.  If unable to obtain Lovenox, will have to discuss plans again with oncology.  Acute respiratory failure with hypoxia -Secondary to acute pulmonary embolus -Currently on 2 L of oxygen -Wean down oxygen as tolerated  Elevated troponin -Secondary to demand ischemia in the setting of acute PE  Non-small cell lung cancer -Recent diagnosis -Being followed by oncology for further work-up -Plans are for outpatient PET scan -MRI brain did not show  any evidence of metastatic disease, although this was a noncontrast study  Hypokalemia -Replace  Diabetes type 2 -Metformin currently on hold -Continue on sliding scale insulin  Hypertension -On Toprol and losartan -Blood pressure currently stable  CKD stage IIIa -Creatinine is currently at baseline  Hyperlipidemia -Continue statin   DVT prophylaxis: therapeutic lovenox  Code Status: DNR Family Communication: discussed with wife over the phone Disposition Plan: Status is: Inpatient Remains inpatient appropriate because: Continued anticoagulation for massive PE and cor pulmonale     Consultants:  PCCM Oncology  Procedures:  Echo  Antimicrobials:      Subjective: He denies any shortness of breath.  He says he does have a mild cough.  Objective: Vitals:   01/17/22 2158 01/18/22 0458 01/18/22 0743 01/18/22 1303  BP: (!) 151/82 (!) 145/96 (!) 170/107 120/76  Pulse: 97 88 87 91  Resp: 20 20 18 18   Temp: 99.1 F (37.3 C) 98 F (36.7 C) 98.2 F (36.8 C) (!) 97.4 F (36.3 C)  TempSrc: Oral Oral Oral   SpO2: 96% (!) 89% 97% 92%  Weight:      Height:        Intake/Output Summary (Last 24 hours) at 01/18/2022 1909 Last data filed at 01/18/2022 1804 Gross per 24 hour  Intake 240 ml  Output 200 ml  Net 40 ml   Filed Weights   01/14/22 2342 01/15/22 1746  Weight: 76 kg 72.5 kg    Examination:  General exam: Appears calm and comfortable  Respiratory system: Clear to auscultation. Respiratory effort normal. Cardiovascular system: S1 & S2 heard, RRR. No JVD, murmurs,  rubs, gallops or clicks. No pedal edema. Gastrointestinal system: Abdomen is nondistended, soft and nontender. No organomegaly or masses felt. Normal bowel sounds heard. Central nervous system: Alert and oriented. No focal neurological deficits. Extremities: Symmetric 5 x 5 power. Skin: No rashes, lesions or ulcers Psychiatry: Judgement and insight appear normal. Mood & affect appropriate.      Data Reviewed: I have personally reviewed following labs and imaging studies  CBC: Recent Labs  Lab 01/14/22 2350 01/15/22 0013 01/16/22 0520 01/17/22 0317  WBC 9.7  --  9.4 9.4  NEUTROABS 7.8*  --   --   --   HGB 15.5 15.6 13.3 14.2  HCT 45.9 46.0 39.5 42.3  MCV 91.1  --  90.2 90.2  PLT 186  --  200 213   Basic Metabolic Panel: Recent Labs  Lab 01/14/22 2350 01/15/22 0013 01/16/22 0520 01/17/22 0317  NA 140 138 140 139  K 3.6 3.5 3.2* 3.7  CL 98 97* 104 106  CO2 28  --  26 24  GLUCOSE 238* 234* 105* 100*  BUN 14 12 12 12   CREATININE 1.35* 1.30* 1.08 1.03  CALCIUM 9.4  --  8.7* 8.8*   GFR: Estimated Creatinine Clearance: 61 mL/min (by C-G formula based on SCr of 1.03 mg/dL). Liver Function Tests: Recent Labs  Lab 01/14/22 2350 01/15/22 1106 01/16/22 0520  AST 35 30 29  ALT 29 25 24   ALKPHOS 182* 163* 142*  BILITOT 0.7 0.2* 0.6  PROT 7.6 7.0 6.1*  ALBUMIN 3.4* 3.1* 2.7*   No results for input(s): "LIPASE", "AMYLASE" in the last 168 hours. No results for input(s): "AMMONIA" in the last 168 hours. Coagulation Profile: No results for input(s): "INR", "PROTIME" in the last 168 hours. Cardiac Enzymes: No results for input(s): "CKTOTAL", "CKMB", "CKMBINDEX", "TROPONINI" in the last 168 hours. BNP (last 3 results) No results for input(s): "PROBNP" in the last 8760 hours. HbA1C: No results for input(s): "HGBA1C" in the last 72 hours. CBG: Recent Labs  Lab 01/17/22 1200 01/17/22 1635 01/17/22 2154 01/18/22 0729 01/18/22 1144  GLUCAP 172* 153* 112* 120* 169*   Lipid Profile: No results for input(s): "CHOL", "HDL", "LDLCALC", "TRIG", "CHOLHDL", "LDLDIRECT" in the last 72 hours. Thyroid Function Tests: No results for input(s): "TSH", "T4TOTAL", "FREET4", "T3FREE", "THYROIDAB" in the last 72 hours. Anemia Panel: No results for input(s): "VITAMINB12", "FOLATE", "FERRITIN", "TIBC", "IRON", "RETICCTPCT" in the last 72 hours. Sepsis Labs: Recent Labs   Lab 01/15/22 1106 01/15/22 2247 01/16/22 0520 01/16/22 1039  LATICACIDVEN 1.5 1.3 1.2 1.1    Recent Results (from the past 240 hour(s))  SARS Coronavirus 2 by RT PCR (hospital order, performed in [facility] hospital lab) *cepheid single result test* Anterior Nasal Swab     Status: None   Collection Time: 01/13/22  6:39 AM   Specimen: Anterior Nasal Swab  Result Value Ref Range Status   SARS Coronavirus 2 by RT PCR NEGATIVE NEGATIVE Final    Comment: (NOTE) SARS-CoV-2 target nucleic acids are NOT DETECTED.  The SARS-CoV-2 RNA is generally detectable in upper and lower respiratory specimens during the acute phase of infection. The lowest concentration of SARS-CoV-2 viral copies this assay can detect is 250 copies / mL. A negative result does not preclude SARS-CoV-2 infection and should not be used as the sole basis for treatment or other patient management decisions.  A negative result may occur with improper specimen collection / handling, submission of specimen other than nasopharyngeal swab, presence of viral mutation(s) within the  areas targeted by this assay, and inadequate number of viral copies (<250 copies / mL). A negative result must be combined with clinical observations, patient history, and epidemiological information.  Fact Sheet for Patients:   https://www.patel.info/  Fact Sheet for Healthcare Providers: https://hall.com/  This test is not yet approved or  cleared by the [place] FDA and has been authorized for detection and/or diagnosis of SARS-CoV-2 by FDA under an Emergency Use Authorization (EUA).  This EUA will remain in effect (meaning this test can be used) for the duration of the COVID-19 declaration under Section 564(b)(1) of the Act, 21 U.S.C. section 360bbb-3(b)(1), unless the authorization is terminated or revoked sooner.  Performed at [facility], [place] [address], [place],  [place] [address]   Resp Panel by RT-PCR (Flu A&B, Covid) Anterior Nasal Swab     Status: None   Collection Time: 01/14/22 11:50 PM   Specimen: Anterior Nasal Swab  Result Value Ref Range Status   SARS Coronavirus 2 by RT PCR NEGATIVE NEGATIVE Final    Comment: (NOTE) SARS-CoV-2 target nucleic acids are NOT DETECTED.  The SARS-CoV-2 RNA is generally detectable in upper respiratory specimens during the acute phase of infection. The lowest concentration of SARS-CoV-2 viral copies this assay can detect is 138 copies/mL. A negative result does not preclude SARS-Cov-2 infection and should not be used as the sole basis for treatment or other patient management decisions. A negative result may occur with  improper specimen collection/handling, submission of specimen other than nasopharyngeal swab, presence of viral mutation(s) within the areas targeted by this assay, and inadequate number of viral copies(<138 copies/mL). A negative result must be combined with clinical observations, patient history, and epidemiological information. The expected result is Negative.  Fact Sheet for Patients:  EntrepreneurPulse.com.au  Fact Sheet for Healthcare Providers:  IncredibleEmployment.be  This test is no t yet approved or cleared by the [place] FDA and  has been authorized for detection and/or diagnosis of SARS-CoV-2 by FDA under an Emergency Use Authorization (EUA). This EUA will remain  in effect (meaning this test can be used) for the duration of the COVID-19 declaration under Section 564(b)(1) of the Act, 21 U.S.C.section 360bbb-3(b)(1), unless the authorization is terminated  or revoked sooner.       Influenza A by PCR NEGATIVE NEGATIVE Final   Influenza B by PCR NEGATIVE NEGATIVE Final    Comment: (NOTE) The Xpert Xpress SARS-CoV-2/FLU/RSV plus assay is intended as an aid in the diagnosis of influenza from Nasopharyngeal swab specimens and should not  be used as a sole basis for treatment. Nasal washings and aspirates are unacceptable for Xpert Xpress SARS-CoV-2/FLU/RSV testing.  Fact Sheet for Patients: EntrepreneurPulse.com.au  Fact Sheet for Healthcare Providers: IncredibleEmployment.be  This test is not yet approved or cleared by the [place] FDA and has been authorized for detection and/or diagnosis of SARS-CoV-2 by FDA under an Emergency Use Authorization (EUA). This EUA will remain in effect (meaning this test can be used) for the duration of the COVID-19 declaration under Section 564(b)(1) of the Act, 21 U.S.C. section 360bbb-3(b)(1), unless the authorization is terminated or revoked.  Performed at [facility], [place] [address], [place], [place] [address]   MRSA Next Gen by PCR, Nasal     Status: None   Collection Time: 01/15/22  5:39 PM   Specimen: Nasal Mucosa; Nasal Swab  Result Value Ref Range Status   MRSA by PCR Next Gen NOT DETECTED NOT DETECTED Final  Comment: (NOTE) The GeneXpert MRSA Assay (FDA approved for NASAL specimens only), is one component of a comprehensive MRSA colonization surveillance program. It is not intended to diagnose MRSA infection nor to guide or monitor treatment for MRSA infections. Test performance is not FDA approved in patients less than 20 years old. Performed at [facility], [place] [address], [place], [place] [address]          Radiology Studies: No results found.      Scheduled Meds:  Chlorhexidine Gluconate Cloth  6 each Topical Daily   dextromethorphan-guaiFENesin  1 tablet Oral BID   donepezil  5 mg Oral QHS   enoxaparin (LOVENOX) injection  70 mg Subcutaneous Q12H   feeding supplement  237 mL Oral BID BM   insulin aspart  0-9 Units Subcutaneous TID WC   losartan  100 mg Oral Daily   metoprolol succinate  25 mg Oral BID   mouth rinse  15 mL Mouth Rinse 4 times per day   primidone   500 mg Oral Daily   primidone  750 mg Oral QHS   simvastatin  20 mg Oral QHS   Continuous Infusions:   LOS: 3 days    Time spent: 68mins    Kathie Dike, MD [facility]   If 7PM-7AM, please contact night-coverage [email or web address]  01/18/2022, 7:09 PM

## 2022-01-18 NOTE — TOC Benefit Eligibility Note (Signed)
Patient Teacher, English as a foreign language completed.    The patient is currently admitted and upon discharge could be taking enoxaparin (Lovenox) 80 mg/0.8 ml.  The current 30 day co-pay is, $100.00.   The patient is insured through [place], [place] Patient Advocate Specialist [place] Patient Advocate Team Direct Number: [phone]  Fax: [phone]

## 2022-01-18 NOTE — Telephone Encounter (Signed)
Pharmacy Patient Advocate Encounter  Insurance verification completed.    The patient is insured through [employer] Part D   The patient is currently admitted and ran test claims for the following: enoxaparin (Lovenox) 80 mg/0.8 ml..  Copays and coinsurance results were relayed to Inpatient clinical team.

## 2022-01-18 NOTE — TOC Initial Note (Signed)
Transition of Care [facility]) - Initial/Assessment Note    Patient Details  Name: Drew Harrington MRN: [id number] Date of Birth: July 07, 1945  Transition of Care [facility]) CM/SW Contact:    Leeroy Cha, RN Phone Number: 01/18/2022, 11:26 AM  Clinical Narrative:                  Transition of Care [facility]) Screening Note   Patient Details  Name: Drew Harrington Date of Birth: 06-15-1946   Transition of Care [facility]) CM/SW Contact:    Leeroy Cha, RN Phone Number: 01/18/2022, 11:26 AM    Transition of Care Department [facility]) has reviewed patient and no TOC needs have been identified at this time. We will continue to monitor patient advancement through interdisciplinary progression rounds. If new patient transition needs arise, please place a TOC consult.    Expected Discharge Plan: Home/Self Care Barriers to Discharge: Continued Medical Work up   Patient Goals and CMS Choice Patient states their goals for this hospitalization and ongoing recovery are:: to go home CMS Medicare.gov Compare Post Acute Care list provided to:: Patient Choice offered to / list presented to : Patient  Expected Discharge Plan and Services Expected Discharge Plan: Home/Self Care   Discharge Planning Services: CM Consult   Living arrangements for the past 2 months: Single Family Home                                      Prior Living Arrangements/Services Living arrangements for the past 2 months: Single Family Home Lives with:: Spouse Patient language and need for interpreter reviewed:: Yes                 Activities of Daily Living Home Assistive Devices/Equipment: None ADL Screening (condition at time of admission) Patient's cognitive ability adequate to safely complete daily activities?: Yes Is the patient deaf or have difficulty hearing?: No Does the patient have difficulty seeing, even when wearing glasses/contacts?: No Does the patient have difficulty concentrating,  remembering, or making decisions?: No Patient able to express need for assistance with ADLs?: Yes Does the patient have difficulty dressing or bathing?: No Independently performs ADLs?: Yes (appropriate for developmental age) Does the patient have difficulty walking or climbing stairs?: No Weakness of Legs: None Weakness of Arms/Hands: None  Permission Sought/Granted                  Emotional Assessment Appearance:: Appears stated age     Orientation: : Oriented to Self, Oriented to Place, Oriented to  Time, Oriented to Situation Alcohol / Substance Use: Not Applicable Psych Involvement: No (comment)  Admission diagnosis:  Pulmonary embolism on right ([place]) [I26.99] Acute pulmonary embolism ([place]) [I26.99] Patient Active Problem List   Diagnosis Date Noted   Right leg DVT ([place]) 01/16/2022   Acute pulmonary embolism with acute cor pulmonale ([place]) 01/15/2022   Hemoptysis 01/15/2022   Chest pain 01/15/2022   Stage 3a chronic kidney disease (CKD) ([place]) 01/15/2022   Mass of left lung 01/11/2022   Mass of upper lobe of left lung 01/06/2022   Memory change 12/25/2021   Cough 12/25/2021   Weakness of right side of body 12/25/2021   Shoulder pain 07/06/2019   Tremor, essential 02/27/2014   Routine health maintenance 01/11/2013   History of colonic polyps 04/10/2010   CATARACT, SENILE, BILATERAL 06/24/2009   HLD (hyperlipidemia) 11/17/2007   DM2 (diabetes mellitus, type 2) (  [place]) 11/17/2007   IMPOTENCE 11/16/2007   HTN (hypertension) 11/16/2007   HEMORRHOIDS 11/16/2007   PCP:  Hoyt Koch, MD Pharmacy:   [facility] [phone] - [place], [address] AT [facility] [address] [place] [address] Phone: [phone] Fax: [phone]     Social Determinants of Health (SDOH) Interventions    Readmission Risk Interventions     No data to display

## 2022-01-19 ENCOUNTER — Inpatient Hospital Stay (HOSPITAL_COMMUNITY)
Admission: RE | Admit: 2022-01-19 | Discharge: 2022-01-19 | Disposition: A | Discharge status: Home / Self Care | Payer: Medicare Other | Source: Ambulatory Visit | Attending: Internal Medicine | Admitting: Internal Medicine

## 2022-01-19 ENCOUNTER — Other Ambulatory Visit (HOSPITAL_COMMUNITY): Payer: Self-pay

## 2022-01-19 DIAGNOSIS — I2609 Other pulmonary embolism with acute cor pulmonale: Secondary | ICD-10-CM

## 2022-01-19 DIAGNOSIS — E785 Hyperlipidemia, unspecified: Secondary | ICD-10-CM

## 2022-01-19 LAB — CBC
HCT: 41.6 % (ref 39.0–52.0)
Hemoglobin: 13.8 g/dL (ref 13.0–17.0)
MCH: 30.3 pg (ref 26.0–34.0)
MCHC: 33.2 g/dL (ref 30.0–36.0)
MCV: 91.2 fL (ref 80.0–100.0)
Platelets: 271 10*3/uL (ref 150–400)
RBC: 4.56 MIL/uL (ref 4.22–5.81)
RDW: 13.6 % (ref 11.5–15.5)
WBC: 9.7 10*3/uL (ref 4.0–10.5)
nRBC: 0 % (ref 0.0–0.2)

## 2022-01-19 LAB — GLUCOSE, CAPILLARY
Glucose-Capillary: 102 mg/dL — ABNORMAL HIGH (ref 70–99)
Glucose-Capillary: 93 mg/dL (ref 70–99)
Glucose-Capillary: 103 mg/dL — ABNORMAL HIGH (ref 70–99)
Glucose-Capillary: 109 mg/dL — ABNORMAL HIGH (ref 70–99)

## 2022-01-19 MED ORDER — METOPROLOL SUCCINATE ER 25 MG PO TB24
25.0000 mg | ORAL_TABLET | Freq: Two times a day (BID) | ORAL | 0 refills | Status: DC
  Filled 2022-01-19: qty 90, 45d supply, fill #0

## 2022-01-19 MED ORDER — ENOXAPARIN SODIUM 80 MG/0.8ML IJ SOSY
70.0000 mg | PREFILLED_SYRINGE | Freq: Two times a day (BID) | INTRAMUSCULAR | 1 refills | Status: DC
  Filled 2022-01-19: qty 48, 30d supply, fill #0
  Filled 2022-01-20: qty 11.2, 8d supply, fill #0
  Filled 2022-01-20: qty 11.2, 7d supply, fill #0

## 2022-01-19 MED ORDER — METOPROLOL SUCCINATE ER 25 MG PO TB24: 25.0000 mg | ORAL_TABLET | Freq: Two times a day (BID) | ORAL | 0 refills | Status: DC

## 2022-01-19 MED ORDER — ENOXAPARIN SODIUM 80 MG/0.8ML IJ SOSY: 70.0000 mg | PREFILLED_SYRINGE | Freq: Two times a day (BID) | INTRAMUSCULAR | 1 refills | Status: DC

## 2022-01-19 NOTE — Discharge Summary (Signed)
Physician Discharge Summary  Drew Harrington CWC:[id number] DOB: 1946-05-07 DOA: 01/14/2022  PCP: Hoyt Koch, MD  Admit date: 01/14/2022 Discharge date: 01/19/2022  Admitted From: home Disposition:  home  Recommendations for Outpatient Follow-up:  Follow up with PCP in 1-2 weeks Please obtain BMP/CBC in one week Follow up with oncology  Home Health: Equipment/Devices:  Discharge Condition:stable CODE STATUS:DNR Diet recommendation: heart healthy, carb modified  Brief/Interim Summary: 76 year old male with a history of hypertension, diabetes, hyperlipidemia, recently diagnosed non-small cell lung cancer/adenocarcinoma, presents with acute onset of shortness of breath.  Found to have acute pulmonary embolism with acute cor pulmonale. Also noted to have right lower extremity DVT.  Admitted to the hospital and started on anticoagulation.  PCCM, oncology consulted on patient  Discharge Diagnoses:  Principal Problem:   Acute pulmonary embolism with acute cor pulmonale (HCC) Active Problems:   HLD (hyperlipidemia)   HTN (hypertension)   DM2 (diabetes mellitus, type 2) (HCC)   Mass of left lung   Hemoptysis   Chest pain   Stage 3a chronic kidney disease (CKD) (HCC)   Right leg DVT (HCC)  Acute pulmonary embolism with acute cor pulmonale Acute right lower extremity DVT -Initially started on heparin infusion, subsequently transition to Lovenox -Per oncology, will need to discharge with Lovenox therapy for at least the next 2 months -Echo shows that RV systolic function is severely reduced.  RV size is normal.  LVEF is normal. -Patient had benefits check performed and cost of Lovenox will be approximately $100 per month -Discussed with patient's wife and she is agreeable to continue with lovenox -Wife plans on coming in for lovenox education and administering medication to patient   Acute respiratory failure with hypoxia -Secondary to acute pulmonary embolus -initially  requiring up to 12L of oxygen -he has since been weaned down to room air   Elevated troponin -Secondary to demand ischemia in the setting of acute PE   Non-small cell lung cancer -Recent diagnosis -Being followed by oncology for further work-up -Plans are for outpatient PET scan -MRI brain did not show any evidence of metastatic disease, although this was a noncontrast study   Hypokalemia -Replace   Diabetes type 2 -Metformin currently on hold -Continue on sliding scale insulin   Hypertension -On Toprol and losartan -Blood pressure currently stable   CKD stage IIIa -Creatinine is currently at baseline   Hyperlipidemia -Continue statin  Discharge Instructions   Allergies as of 01/19/2022       Reactions   Sildenafil Other (See Comments)   Caused a headache        Medication List     TAKE these medications    donepezil 5 MG tablet Commonly known as: ARICEPT Take 5 mg by mouth at bedtime.   enoxaparin 80 MG/0.8ML injection Commonly known as: LOVENOX Inject 0.7 mLs (70 mg total) into the skin every 12 (twelve) hours.   losartan-hydrochlorothiazide 100-25 MG tablet Commonly known as: HYZAAR TAKE 1 TABLET BY MOUTH DAILY   metFORMIN 1000 MG tablet Commonly known as: GLUCOPHAGE Take 1 tablet (1,000 mg total) by mouth daily with breakfast.   metoprolol succinate 25 MG 24 hr tablet Commonly known as: TOPROL-XL Take 1 tablet (25 mg total) by mouth in the morning and at bedtime. What changed: See the new instructions.   potassium chloride SA 20 MEQ tablet Commonly known as: KLOR-CON M Take 1 tablet (20 mEq total) by mouth daily.   primidone 250 MG tablet Commonly known as: MYSOLINE Take 250  mg by mouth See admin instructions. 2 tablets in AM and 3 tablets at bedtime   simvastatin 20 MG tablet Commonly known as: ZOCOR TAKE 1 TABLET(20 MG) BY MOUTH AT BEDTIME What changed:  how much to take how to take this when to take this additional instructions         Allergies  Allergen Reactions   Sildenafil Other (See Comments)    Caused a headache    Consultations: PCCM oncology   Procedures/Studies: MR BRAIN WO CONTRAST  Result Date: 01/16/2022 CLINICAL DATA:  Non-small cell lung cancer staging EXAM: MRI HEAD WITHOUT CONTRAST TECHNIQUE: Multiplanar, multiecho pulse sequences of the brain and surrounding structures were obtained without intravenous contrast. COMPARISON:  Head CT 12/30/2021 FINDINGS: Brain: No swelling or masslike finding to suggest metastatic disease. Chronic small vessel ischemic gliosis in the cerebral white matter with chronic perforator infarct at the left basal ganglia. Chronic lacunar infarcts in the bilateral thalami with numerous remote micro hemorrhages in the deep brain. Small remote left occipital and right parietal cortex infarcts. Wallerian changes seen in the bilateral middle cerebellar peduncle associated with a chronic right pontine lacune. No acute hemorrhage, acute infarct, hydrocephalus, or collection. Vascular: Major flow voids are preserved Skull and upper cervical spine: Normal marrow signal Sinuses/Orbits: Negative IMPRESSION: 1. Limited for staging purposes due to lack of IV contrast. No gross masslike features. 2. Advanced chronic small vessel ischemia. Electronically Signed   By: Jorje Guild M.D.   On: 01/16/2022 12:07   VAS [place] LOWER EXTREMITY VENOUS (DVT)  Result Date: 01/16/2022  Lower Venous DVT Study Patient Name:  Drew Harrington  Date of Exam:   01/15/2022 Medical Rec #: [id number]          Accession #:    [id number] Date of Birth: [birth year]           Patient Gender: M Patient Age:   76 years Exam Location:  [facility] Procedure:      VAS [place] LOWER EXTREMITY VENOUS (DVT) Referring Phys: Nevin Bloodgood SIMPSON --------------------------------------------------------------------------------  Indications: Pulmonary embolism.  Risk Factors: Confirmed PE. Anticoagulation: Heparin. Comparison Study: No  prior studies. Performing Technologist: Oliver Hum RVT  Examination Guidelines: A complete evaluation includes B-mode imaging, spectral Doppler, color Doppler, and power Doppler as needed of all accessible portions of each vessel. Bilateral testing is considered an integral part of a complete examination. Limited examinations for reoccurring indications may be performed as noted. The reflux portion of the exam is performed with the patient in reverse Trendelenburg.  +---------+---------------+---------+-----------+----------+--------------+ RIGHT    CompressibilityPhasicitySpontaneityPropertiesThrombus Aging +---------+---------------+---------+-----------+----------+--------------+ CFV      Full           Yes      Yes                                 +---------+---------------+---------+-----------+----------+--------------+ SFJ      Full                                                        +---------+---------------+---------+-----------+----------+--------------+ FV Prox  Full                                                        +---------+---------------+---------+-----------+----------+--------------+  FV Mid   Full                                                        +---------+---------------+---------+-----------+----------+--------------+ FV DistalPartial        Yes      No                   Acute          +---------+---------------+---------+-----------+----------+--------------+ PFV      Full                                                        +---------+---------------+---------+-----------+----------+--------------+ POP      None           No       No                   Acute          +---------+---------------+---------+-----------+----------+--------------+ PTV      Full                                                        +---------+---------------+---------+-----------+----------+--------------+ PERO     Partial                                       Acute          +---------+---------------+---------+-----------+----------+--------------+ Soleal   Partial                                      Acute          +---------+---------------+---------+-----------+----------+--------------+   +---------+---------------+---------+-----------+----------+--------------+ LEFT     CompressibilityPhasicitySpontaneityPropertiesThrombus Aging +---------+---------------+---------+-----------+----------+--------------+ CFV      Full           Yes      Yes                                 +---------+---------------+---------+-----------+----------+--------------+ SFJ      Full                                                        +---------+---------------+---------+-----------+----------+--------------+ FV Prox  Full                                                        +---------+---------------+---------+-----------+----------+--------------+ FV Mid   Full                                                        +---------+---------------+---------+-----------+----------+--------------+  FV DistalFull                                                        +---------+---------------+---------+-----------+----------+--------------+ PFV      Full                                                        +---------+---------------+---------+-----------+----------+--------------+ POP      Full           Yes      Yes                                 +---------+---------------+---------+-----------+----------+--------------+ PTV      Full                                                        +---------+---------------+---------+-----------+----------+--------------+ PERO     Full                                                        +---------+---------------+---------+-----------+----------+--------------+     Summary: RIGHT: - Findings consistent with acute deep vein thrombosis  involving the right femoral vein, right popliteal vein, right peroneal veins, and right soleal veins. - No cystic structure found in the popliteal fossa.  LEFT: - There is no evidence of deep vein thrombosis in the lower extremity.  - No cystic structure found in the popliteal fossa.  *See table(s) above for measurements and observations. Electronically signed by Orlie Pollen on 01/16/2022 at 12:00:16 PM.    Final    ECHOCARDIOGRAM COMPLETE  Result Date: 01/15/2022    ECHOCARDIOGRAM REPORT   Patient Name:   Drew Harrington Date of Exam: 01/15/2022 Medical Rec #:  [id number]         Height:       69.0 in Accession #:    [id number]        Weight:       167.5 lb Date of Birth:  September 07, 1945          BSA:          1.916 m Patient Age:    47 years          BP:           125/97 mmHg Patient Gender: M                 HR:           93 bpm. Exam Location:  Inpatient Procedure: 2D Echo, Cardiac Doppler and Color Doppler Indications:    Pulmonary embolus  History:        Patient has prior history of Echocardiogram examinations. Risk                 Factors:Hypertension and  Diabetes.  Sonographer:    Jyl Heinz Referring Phys: [id number] [place]  1. Cor pulmonale with severe TR and RV dysfucntion in setting of clinical PE.  2. Left ventricular ejection fraction, by estimation, is 55%. The left ventricle has normal function. The left ventricle has no regional wall motion abnormalities. Left ventricular diastolic parameters are consistent with Grade I diastolic dysfunction (impaired relaxation).  3. Right ventricular systolic function is severely reduced. The right ventricular size is normal.  4. The mitral valve is abnormal. Trivial mitral valve regurgitation. No evidence of mitral stenosis.  5. Tricuspid valve regurgitation is severe.  6. The aortic valve is tricuspid. There is mild calcification of the aortic valve. There is mild thickening of the aortic valve. Aortic valve regurgitation is not visualized.  Aortic valve sclerosis is present, with no evidence of aortic valve stenosis.  7. The inferior vena cava is dilated in size with >50% respiratory variability, suggesting right atrial pressure of 8 mmHg. FINDINGS  Left Ventricle: Left ventricular ejection fraction, by estimation, is 55%. The left ventricle has normal function. The left ventricle has no regional wall motion abnormalities. The left ventricular internal cavity size was normal in size. There is no left ventricular hypertrophy. Left ventricular diastolic parameters are consistent with Grade I diastolic dysfunction (impaired relaxation). Right Ventricle: The right ventricular size is normal. No increase in right ventricular wall thickness. Right ventricular systolic function is severely reduced. Left Atrium: Left atrial size was normal in size. Right Atrium: Right atrial size was normal in size. Pericardium: There is no evidence of pericardial effusion. Mitral Valve: The mitral valve is abnormal. There is mild thickening of the mitral valve leaflet(s). There is mild calcification of the mitral valve leaflet(s). Trivial mitral valve regurgitation. No evidence of mitral valve stenosis. Tricuspid Valve: The tricuspid valve is normal in structure. Tricuspid valve regurgitation is severe. No evidence of tricuspid stenosis. Aortic Valve: The aortic valve is tricuspid. There is mild calcification of the aortic valve. There is mild thickening of the aortic valve. Aortic valve regurgitation is not visualized. Aortic valve sclerosis is present, with no evidence of aortic valve stenosis. Aortic valve peak gradient measures 3.3 mmHg. Pulmonic Valve: The pulmonic valve was normal in structure. Pulmonic valve regurgitation is trivial. No evidence of pulmonic stenosis. Aorta: The aortic root is normal in size and structure. Venous: The inferior vena cava is dilated in size with greater than 50% respiratory variability, suggesting right atrial pressure of 8 mmHg.  IAS/Shunts: No atrial level shunt detected by color flow Doppler. 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  LEFT VENTRICLE PLAX 2D LVIDd:         3.60 cm     Diastology LVIDs:         2.30 cm     LV e' medial:    5.33 cm/s LV PW:         1.30 cm     LV E/e' medial:  14.5 LV IVS:        1.40 cm     LV e' lateral:   5.98 cm/s LVOT diam:     2.00 cm     LV E/e' lateral: 12.9 LV SV:         40 LV SV Index:   21 LVOT Area:     3.14 cm  LV Volumes (MOD) LV vol d, MOD A2C: 76.4 ml LV vol d, MOD A4C: 72.7 ml LV vol  s, MOD A2C: 28.5 ml LV vol s, MOD A4C: 30.2 ml LV SV MOD A2C:     47.9 ml LV SV MOD A4C:     72.7 ml LV SV MOD BP:      43.1 ml RIGHT VENTRICLE            IVC RV Basal diam:  3.70 cm    IVC diam: 1.90 cm RV Mid diam:    2.70 cm RV S prime:     8.27 cm/s TAPSE (M-mode): 1.8 cm LEFT ATRIUM           Index        RIGHT ATRIUM           Index LA diam:      2.70 cm 1.41 cm/m   RA Area:     18.20 cm LA Vol (A2C): 26.0 ml 13.57 ml/m  RA Volume:   54.90 ml  28.65 ml/m LA Vol (A4C): 38.7 ml 20.20 ml/m  AORTIC VALVE                 PULMONIC VALVE AV Area (Vmax): 2.87 cm     PR End Diast Vel: 1.41 msec AV Vmax:        91.50 cm/s AV Peak Grad:   3.3 mmHg LVOT Vmax:      83.60 cm/s LVOT Vmean:     66.400 cm/s LVOT VTI:       0.126 m  AORTA Ao Root diam: 3.60 cm Ao Asc diam:  3.70 cm MITRAL VALVE               TRICUSPID VALVE MV Area (PHT): 10.54 cm   TR Peak grad:   41.7 mmHg MV Decel Time: 72 msec     TR Vmax:        323.00 cm/s MV E velocity: 77.10 cm/s MV A velocity: 76.30 cm/s  SHUNTS MV E/A ratio:  1.01        Systemic VTI:  0.13 m                            Systemic Diam: 2.00 cm Jenkins Rouge MD Electronically signed by Jenkins Rouge MD Signature Date/Time: 01/15/2022/2:01:41 PM    Final    CT Angio Chest PE W and/or Wo Contrast  Result Date: 01/15/2022 CLINICAL DATA:  Pulmonary embolus suspected with high probability. Productive cough, low oxygen  saturation, recent diagnosis of lung cancer. EXAM: CT ANGIOGRAPHY CHEST WITH CONTRAST TECHNIQUE: Multidetector CT imaging of the chest was performed using the standard protocol during bolus administration of intravenous contrast. Multiplanar CT image reconstructions and MIPs were obtained to evaluate the vascular anatomy. RADIATION DOSE REDUCTION: This exam was performed according to the departmental dose-optimization program which includes automated exposure control, adjustment of the mA and/or kV according to patient size and/or use of iterative reconstruction technique. CONTRAST:  83mL OMNIPAQUE IOHEXOL 350 MG/ML SOLN COMPARISON:  12/30/2021 FINDINGS: Cardiovascular: Large filling defects are demonstrated in the right main pulmonary artery and extending into upper and lower lobe branches consistent with acute pulmonary embolus. Finding is new since prior study. Cardiac enlargement. No pericardial effusion. Elevated RV to LV ratio at 1.5. Normal caliber thoracic aorta. There is eccentric thrombus in the aortic arch and descending aorta, similar to prior study. Mediastinum/Nodes: Esophagus is decompressed. Lymphadenopathy in the left hilum and left paratracheal region, similar to prior study. Thyroid gland is unremarkable. Lungs/Pleura: Large left apical mass extending  to the left hilum and surrounding the left subclavian artery with partially surrounding the left carotid artery origin. Mass measures up to about 7.2 x 8 cm in diameter. This is likely to represent primary lung cancer. There is atelectasis or consolidation in the left upper lung and left lower lung. Moderate left pleural effusion. Patchy peripheral wedge-shaped opacities are demonstrated in the right upper lung, new since prior study, possibly peripheral infarcts. Upper Abdomen: Large left adrenal gland nodule measuring 3.8 cm diameter, unchanged since prior study and likely metastatic. Circumscribed low-attenuation lesion centrally in the liver  measuring 3.6 cm diameter is unchanged since prior study, likely representing a benign cyst. Musculoskeletal: No chest wall abnormality. No acute or significant osseous findings. Review of the MIP images confirms the above findings. IMPRESSION: 1. Positive examination for pulmonary embolus with large right main and multiple peripheral pulmonary emboli. Elevated RV to LV ratio at 1.5 suggesting right heart strain. Positive for acute PE with CT evidence of right heart strain (RV/LV Ratio = 1.5) consistent with at least submassive (intermediate risk) PE. The presence of right heart strain has been associated with an increased risk of morbidity and mortality. Please refer to the "Code PE Focused" order set in EPIC. 2. Again demonstrated is a large left apical/hilar/mediastinal mass lesion likely representing primary lung cancer with lymphadenopathy and direct mediastinal invasion. 3.8 cm left adrenal gland nodule most likely represents a metastasis. 3. Moderate left pleural effusion with atelectasis or infiltration in the left lung. 4. Probable small peripheral infarcts in the right lung. Critical Value/emergent results were called by telephone at the time of interpretation on 01/15/2022 at 1:25 am to provider Tegler, who verbally acknowledged these results. Electronically Signed   By: Lucienne Capers M.D.   On: 01/15/2022 01:37   DG Chest Port 1 View  Result Date: 01/15/2022 CLINICAL DATA:  Shortness of breath, cough with bloody sputum. Recent diagnosis of lung cancer with biopsy. EXAM: PORTABLE CHEST 1 VIEW COMPARISON:  12/25/2021. FINDINGS: The heart size and mediastinal contours are stable. A consolidative opacity is noted in the left upper lobe at the paramediastinal border and is unchanged from the prior exam. Mild atelectasis is present at the left lung base. The right lung is clear. No effusion or pneumothorax. No acute osseous abnormality. IMPRESSION: 1. Stable mass in the left upper lobe along the  paramediastinal border. 2. Mild atelectasis at the left lung base. Electronically Signed   By: Brett Fairy M.D.   On: 01/15/2022 00:12   CT Chest W Contrast  Result Date: 12/30/2021 CLINICAL DATA:  Abnormal chest radiograph, lung mass. History of bladder cancer. Weight loss. EXAM: CT CHEST WITH CONTRAST TECHNIQUE: Multidetector CT imaging of the chest was performed during intravenous contrast administration. RADIATION DOSE REDUCTION: This exam was performed according to the departmental dose-optimization program which includes automated exposure control, adjustment of the mA and/or kV according to patient size and/or use of iterative reconstruction technique. CONTRAST:  32mL ISOVUE-300 IOPAMIDOL (ISOVUE-300) INJECTION 61% COMPARISON:  None Available. FINDINGS: Cardiovascular: Peripheral filling defect is seen at the bifurcation of the right upper lobe and interlobar pulmonary artery (6/190 3-212). There is eccentric soft tissue along the lateral wall of the distal aortic arch and proximal descending thoracic aorta. Markedly narrowed or obstructed left subclavian vein with extensive collateral venous flow. Atherosclerotic calcification of the aorta and coronary arteries. Enlarged pulmonic trunk. Heart size normal. Left ventricular hypertrophy. No pericardial effusion. Mediastinum/Nodes: 14 mm low-attenuation left thyroid nodule. No follow-up recommended. (Ref: J  Am Coll Radiol. 2015 Feb;12(2): 143-50).Necrotic appearing left supraclavicular lymph node measures 1.6 cm (2/17). AP window lymph nodes measure up to 1.3 cm. Low left paratracheal lymph node measures 9 mm. There is left-sided mediastinal invasion by a large necrotic mass in the left upper lobe. Left hilar adenopathy measures up to 1.5 cm. No right hilar or axillary adenopathy. Esophagus is grossly unremarkable. Lungs/Pleura: Centrilobular and paraseptal emphysema. Large necrotic mass in the apical left upper lobe measures 6.4 x 7.1 cm. Associated  mediastinal and probable thoracic inlet invasion. Septal thickening and ground-glass in the apical left upper lobe. Additional ground-glass in the posterior aspect of the left upper lobe and lingula as well as left lower lobe. Findings suggest a component of pneumonitis/lymphedema. Minimal dependent atelectasis in the right lung. Small left pleural effusion. Upper Abdomen: Liver is decreased in attenuation diffusely. Low-attenuation lesions in the liver measure up to 3.7 cm and are likely cysts. Numerous stones and/or calcified polyps in the gallbladder. Nodular thickening of the right adrenal gland. Left adrenal mass measures 2.6 x 3.9 cm. 1.9 cm fluid density cyst in the right kidney. No follow-up necessary. Partially imaged 1.4 cm low-attenuation lesion in the left kidney. No specific follow-up necessary. Spleen, pancreas, stomach and bowel are grossly unremarkable. Small gastrohepatic ligament lymph nodes. Musculoskeletal: Degenerative changes in the spine. No worrisome lytic or sclerotic lesions. IMPRESSION: 1. Large necrotic appearing left upper lobe mass with mediastinal and suspected thoracic inlet invasion, left supraclavicular/mediastinal/left hilar adenopathy and left adrenal mass, compatible with stage IV lung cancer. 2. 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 3. Probable bland peripheral tumor thrombus within the distal aortic arch and proximal descending thoracic aorta. 4. Peripheral filling defect at the bifurcation of the right upper lobe and interlobar pulmonary arteries, indicative of a chronic pulmonary embolus. These results will be called to the ordering clinician or representative by the Radiologist Assistant, and communication documented in the PACS or [employer]. 5. Left upper lobe mass exerts mass effect on the left subclavian vein which is markedly narrowed or obstructed with extensive collateral venous flow. 6. Small left pleural effusion. 7. Hepatic steatosis. 8. Gallstones  and/or gallbladder polyps. 9. Aortic atherosclerosis (ICD10-I70.0). Coronary artery calcification. 10. Enlarged pulmonic trunk, indicative of pulmonary arterial hypertension. 11.  Emphysema (ICD10-J43.9). Electronically Signed   By: Lorin Picket M.D.   On: 12/30/2021 16:35   CT HEAD WO CONTRAST (5MM)  Result Date: 12/30/2021 CLINICAL DATA:  Neuro deficit, acute, stroke suspected. Memory changes/memory loss. Right leg weakness. EXAM: CT HEAD WITHOUT CONTRAST TECHNIQUE: Contiguous axial images were obtained from the base of the skull through the vertex without intravenous contrast. RADIATION DOSE REDUCTION: This exam was performed according to the departmental dose-optimization program which includes automated exposure control, adjustment of the mA and/or kV according to patient size and/or use of iterative reconstruction technique. COMPARISON:  Head MRI 08/01/2021 FINDINGS: Brain: There is no evidence of an acute infarct, intracranial hemorrhage, mass, midline shift, or extra-axial fluid collection. 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 Hypodensities in the cerebral white matter bilaterally are nonspecific but compatible with moderate chronic small vessel ischemic disease. There is mild cerebral atrophy. Vascular: Calcified atherosclerosis at the skull base. No hyperdense vessel. Skull: No fracture or suspicious osseous lesion. Sinuses/Orbits: Similar mucosal thickening in the paranasal sinuses, moderate in the posterior left ethmoid air cells. Clear mastoid air cells. Bilateral cataract extraction. Other: None. IMPRESSION: 1. No evidence of acute intracranial  abnormality. 2. 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 Electronically Signed   By: Logan Bores M.D.   On: 12/30/2021 13:07   DG Chest 2 View  Result Date: 12/25/2021 CLINICAL DATA:  Dry cough for several days, initial  encounter EXAM: CHEST - 2 VIEW COMPARISON:  None Available. FINDINGS: Cardiac shadow is within normal limits. Lungs are well aerated bilaterally. In the medial aspect of left apex there is a soft tissue lesion which measures approximately 4 cm in dimension suspicious for underlying lung mass. CT of the chest with contrast material is recommended. No other focal abnormality is noted. IMPRESSION: Soft tissue mass in the medial aspect of the left lung apex. Further evaluation by means of CT of the chest with contrast is recommended. These results will be called to the ordering clinician or representative by the Radiologist Assistant, and communication documented in the PACS or [employer]. Electronically Signed   By: Inez Catalina M.D.   On: 12/25/2021 21:16      Subjective: Denies any shortness of breath, no chest pain  Discharge Exam: Vitals:   01/19/22 0600 01/19/22 1000 01/19/22 1200 01/19/22 1234  BP:    131/88  Pulse:    85  Resp:    18  Temp:    (!) 97.5 F (36.4 C)  TempSrc:    Oral  SpO2: 95% 91% 93% 90%  Weight:      Height:        General: Pt is alert, awake, not in acute distress Cardiovascular: RRR, S1/S2 +, no rubs, no gallops Respiratory: CTA bilaterally, no wheezing, no rhonchi Abdominal: Soft, NT, ND, bowel sounds + Extremities: no edema, no cyanosis    The results of significant diagnostics from this hospitalization (including imaging, microbiology, ancillary and laboratory) are listed below for reference.     Microbiology: Recent Results (from the past 240 hour(s))  SARS Coronavirus 2 by RT PCR (hospital order, performed in [facility] hospital lab) *cepheid single result test* Anterior Nasal Swab     Status: None   Collection Time: 01/13/22  6:39 AM   Specimen: Anterior Nasal Swab  Result Value Ref Range Status   SARS Coronavirus 2 by RT PCR NEGATIVE NEGATIVE Final    Comment: (NOTE) SARS-CoV-2 target nucleic acids are NOT DETECTED.  The SARS-CoV-2 RNA  is generally detectable in upper and lower respiratory specimens during the acute phase of infection. The lowest concentration of SARS-CoV-2 viral copies this assay can detect is 250 copies / mL. A negative result does not preclude SARS-CoV-2 infection and should not be used as the sole basis for treatment or other patient management decisions.  A negative result may occur with improper specimen collection / handling, submission of specimen other than nasopharyngeal swab, presence of viral mutation(s) within the areas targeted by this assay, and inadequate number of viral copies (<250 copies / mL). A negative result must be combined with clinical observations, patient history, and epidemiological information.  Fact Sheet for Patients:   https://www.patel.info/  Fact Sheet for Healthcare Providers: https://hall.com/  This test is not yet approved or  cleared by the [place] FDA and has been authorized for detection and/or diagnosis of SARS-CoV-2 by FDA under an Emergency Use Authorization (EUA).  This EUA will remain in effect (meaning this test can be used) for the duration of the COVID-19 declaration under Section 564(b)(1) of the Act, 21 U.S.C. section 360bbb-3(b)(1), unless the authorization is terminated or revoked sooner.  Performed  at [facility], [place] [address], [place], [place] [address]   Resp Panel by RT-PCR (Flu A&B, Covid) Anterior Nasal Swab     Status: None   Collection Time: 01/14/22 11:50 PM   Specimen: Anterior Nasal Swab  Result Value Ref Range Status   SARS Coronavirus 2 by RT PCR NEGATIVE NEGATIVE Final    Comment: (NOTE) SARS-CoV-2 target nucleic acids are NOT DETECTED.  The SARS-CoV-2 RNA is generally detectable in upper respiratory specimens during the acute phase of infection. The lowest concentration of SARS-CoV-2 viral copies this assay can detect is 138 copies/mL. A negative result does not  preclude SARS-Cov-2 infection and should not be used as the sole basis for treatment or other patient management decisions. A negative result may occur with  improper specimen collection/handling, submission of specimen other than nasopharyngeal swab, presence of viral mutation(s) within the areas targeted by this assay, and inadequate number of viral copies(<138 copies/mL). A negative result must be combined with clinical observations, patient history, and epidemiological information. The expected result is Negative.  Fact Sheet for Patients:  EntrepreneurPulse.com.au  Fact Sheet for Healthcare Providers:  IncredibleEmployment.be  This test is no t yet approved or cleared by the [place] FDA and  has been authorized for detection and/or diagnosis of SARS-CoV-2 by FDA under an Emergency Use Authorization (EUA). This EUA will remain  in effect (meaning this test can be used) for the duration of the COVID-19 declaration under Section 564(b)(1) of the Act, 21 U.S.C.section 360bbb-3(b)(1), unless the authorization is terminated  or revoked sooner.       Influenza A by PCR NEGATIVE NEGATIVE Final   Influenza B by PCR NEGATIVE NEGATIVE Final    Comment: (NOTE) The Xpert Xpress SARS-CoV-2/FLU/RSV plus assay is intended as an aid in the diagnosis of influenza from Nasopharyngeal swab specimens and should not be used as a sole basis for treatment. Nasal washings and aspirates are unacceptable for Xpert Xpress SARS-CoV-2/FLU/RSV testing.  Fact Sheet for Patients: EntrepreneurPulse.com.au  Fact Sheet for Healthcare Providers: IncredibleEmployment.be  This test is not yet approved or cleared by the [place] FDA and has been authorized for detection and/or diagnosis of SARS-CoV-2 by FDA under an Emergency Use Authorization (EUA). This EUA will remain in effect (meaning this test can be used) for the  duration of the COVID-19 declaration under Section 564(b)(1) of the Act, 21 U.S.C. section 360bbb-3(b)(1), unless the authorization is terminated or revoked.  Performed at [facility], [place] [address], [place], [place] [address]   MRSA Next Gen by PCR, Nasal     Status: None   Collection Time: 01/15/22  5:39 PM   Specimen: Nasal Mucosa; Nasal Swab  Result Value Ref Range Status   MRSA by PCR Next Gen NOT DETECTED NOT DETECTED Final    Comment: (NOTE) The GeneXpert MRSA Assay (FDA approved for NASAL specimens only), is one component of a comprehensive MRSA colonization surveillance program. It is not intended to diagnose MRSA infection nor to guide or monitor treatment for MRSA infections. Test performance is not FDA approved in patients less than 25 years old. Performed at [facility], [place] [address] [place], [place] [address]      Labs: BNP (last 3 results) Recent Labs    01/15/22 2247 01/16/22 0520 01/16/22 1039  BNP 409.2* 399.7* 702.6*   Basic Metabolic Panel: Recent Labs  Lab 01/14/22 2350 01/15/22 0013 01/16/22 0520 01/17/22 0317  NA 140 138 140 139  K  3.6 3.5 3.2* 3.7  CL 98 97* 104 106  CO2 28  --  26 24  GLUCOSE 238* 234* 105* 100*  BUN 14 12 12 12   CREATININE 1.35* 1.30* 1.08 1.03  CALCIUM 9.4  --  8.7* 8.8*   Liver Function Tests: Recent Labs  Lab 01/14/22 2350 01/15/22 1106 01/16/22 0520  AST 35 30 29  ALT 29 25 24   ALKPHOS 182* 163* 142*  BILITOT 0.7 0.2* 0.6  PROT 7.6 7.0 6.1*  ALBUMIN 3.4* 3.1* 2.7*   No results for input(s): "LIPASE", "AMYLASE" in the last 168 hours. No results for input(s): "AMMONIA" in the last 168 hours. CBC: Recent Labs  Lab 01/14/22 2350 01/15/22 0013 01/16/22 0520 01/17/22 0317 01/19/22 0358  WBC 9.7  --  9.4 9.4 9.7  NEUTROABS 7.8*  --   --   --   --   HGB 15.5 15.6 13.3 14.2 13.8  HCT 45.9 46.0 39.5 42.3 41.6  MCV 91.1  --  90.2 90.2 91.2  PLT 186  --  200 235  271   Cardiac Enzymes: No results for input(s): "CKTOTAL", "CKMB", "CKMBINDEX", "TROPONINI" in the last 168 hours. BNP: Invalid input(s): "POCBNP" CBG: Recent Labs  Lab 01/18/22 0729 01/18/22 1144 01/18/22 2147 01/19/22 0713 01/19/22 1157  GLUCAP 120* 169* 121* 102* 93   D-Dimer No results for input(s): "DDIMER" in the last 72 hours. Hgb A1c No results for input(s): "HGBA1C" in the last 72 hours. Lipid Profile No results for input(s): "CHOL", "HDL", "LDLCALC", "TRIG", "CHOLHDL", "LDLDIRECT" in the last 72 hours. Thyroid function studies No results for input(s): "TSH", "T4TOTAL", "T3FREE", "THYROIDAB" in the last 72 hours.  Invalid input(s): "FREET3" Anemia work up No results for input(s): "VITAMINB12", "FOLATE", "FERRITIN", "TIBC", "IRON", "RETICCTPCT" in the last 72 hours. Urinalysis    Component Value Date/Time   COLORURINE YELLOW 12/25/2021 0928   APPEARANCEUR CLEAR 12/25/2021 0928   LABSPEC 1.025 12/25/2021 0928   PHURINE 5.0 12/25/2021 0928   GLUCOSEU NEGATIVE 12/25/2021 0928   HGBUR NEGATIVE 12/25/2021 0928   BILIRUBINUR NEGATIVE 12/25/2021 0928   BILIRUBINUR neg 08/23/2011 1314   KETONESUR NEGATIVE 12/25/2021 0928   PROTEINUR NEGATIVE 03/23/2019 0003   UROBILINOGEN 2.0 (A) 12/25/2021 0928   NITRITE NEGATIVE 12/25/2021 0928   LEUKOCYTESUR NEGATIVE 12/25/2021 0928   Sepsis Labs Recent Labs  Lab 01/14/22 2350 01/16/22 0520 01/17/22 0317 01/19/22 0358  WBC 9.7 9.4 9.4 9.7   Microbiology Recent Results (from the past 240 hour(s))  SARS Coronavirus 2 by RT PCR (hospital order, performed in [facility] hospital lab) *cepheid single result test* Anterior Nasal Swab     Status: None   Collection Time: 01/13/22  6:39 AM   Specimen: Anterior Nasal Swab  Result Value Ref Range Status   SARS Coronavirus 2 by RT PCR NEGATIVE NEGATIVE Final    Comment: (NOTE) SARS-CoV-2 target nucleic acids are NOT DETECTED.  The SARS-CoV-2 RNA is generally detectable in upper  and lower respiratory specimens during the acute phase of infection. The lowest concentration of SARS-CoV-2 viral copies this assay can detect is 250 copies / mL. A negative result does not preclude SARS-CoV-2 infection and should not be used as the sole basis for treatment or other patient management decisions.  A negative result may occur with improper specimen collection / handling, submission of specimen other than nasopharyngeal swab, presence of viral mutation(s) within the areas targeted by this assay, and inadequate number of viral copies (<250 copies / mL). A negative result must  be combined with clinical observations, patient history, and epidemiological information.  Fact Sheet for Patients:   https://www.patel.info/  Fact Sheet for Healthcare Providers: https://hall.com/  This test is not yet approved or  cleared by the [place] FDA and has been authorized for detection and/or diagnosis of SARS-CoV-2 by FDA under an Emergency Use Authorization (EUA).  This EUA will remain in effect (meaning this test can be used) for the duration of the COVID-19 declaration under Section 564(b)(1) of the Act, 21 U.S.C. section 360bbb-3(b)(1), unless the authorization is terminated or revoked sooner.  Performed at [facility], [place] [address], [place], [place] [address]   Resp Panel by RT-PCR (Flu A&B, Covid) Anterior Nasal Swab     Status: None   Collection Time: 01/14/22 11:50 PM   Specimen: Anterior Nasal Swab  Result Value Ref Range Status   SARS Coronavirus 2 by RT PCR NEGATIVE NEGATIVE Final    Comment: (NOTE) SARS-CoV-2 target nucleic acids are NOT DETECTED.  The SARS-CoV-2 RNA is generally detectable in upper respiratory specimens during the acute phase of infection. The lowest concentration of SARS-CoV-2 viral copies this assay can detect is 138 copies/mL. A negative result does not preclude SARS-Cov-2 infection and  should not be used as the sole basis for treatment or other patient management decisions. A negative result may occur with  improper specimen collection/handling, submission of specimen other than nasopharyngeal swab, presence of viral mutation(s) within the areas targeted by this assay, and inadequate number of viral copies(<138 copies/mL). A negative result must be combined with clinical observations, patient history, and epidemiological information. The expected result is Negative.  Fact Sheet for Patients:  EntrepreneurPulse.com.au  Fact Sheet for Healthcare Providers:  IncredibleEmployment.be  This test is no t yet approved or cleared by the [place] FDA and  has been authorized for detection and/or diagnosis of SARS-CoV-2 by FDA under an Emergency Use Authorization (EUA). This EUA will remain  in effect (meaning this test can be used) for the duration of the COVID-19 declaration under Section 564(b)(1) of the Act, 21 U.S.C.section 360bbb-3(b)(1), unless the authorization is terminated  or revoked sooner.       Influenza A by PCR NEGATIVE NEGATIVE Final   Influenza B by PCR NEGATIVE NEGATIVE Final    Comment: (NOTE) The Xpert Xpress SARS-CoV-2/FLU/RSV plus assay is intended as an aid in the diagnosis of influenza from Nasopharyngeal swab specimens and should not be used as a sole basis for treatment. Nasal washings and aspirates are unacceptable for Xpert Xpress SARS-CoV-2/FLU/RSV testing.  Fact Sheet for Patients: EntrepreneurPulse.com.au  Fact Sheet for Healthcare Providers: IncredibleEmployment.be  This test is not yet approved or cleared by the [place] FDA and has been authorized for detection and/or diagnosis of SARS-CoV-2 by FDA under an Emergency Use Authorization (EUA). This EUA will remain in effect (meaning this test can be used) for the duration of the COVID-19 declaration  under Section 564(b)(1) of the Act, 21 U.S.C. section 360bbb-3(b)(1), unless the authorization is terminated or revoked.  Performed at [facility], [place] [address], [place], [place] [address]   MRSA Next Gen by PCR, Nasal     Status: None   Collection Time: 01/15/22  5:39 PM   Specimen: Nasal Mucosa; Nasal Swab  Result Value Ref Range Status   MRSA by PCR Next Gen NOT DETECTED NOT DETECTED Final    Comment: (NOTE) The GeneXpert MRSA Assay (FDA approved for NASAL specimens only), is one component of a comprehensive  MRSA colonization surveillance program. It is not intended to diagnose MRSA infection nor to guide or monitor treatment for MRSA infections. Test performance is not FDA approved in patients less than 39 years old. Performed at [facility], [place] [address] [place], [place] [address]      Time coordinating discharge: 75mins  SIGNED:   Kathie Dike, MD  [facility] 01/19/2022, 4:01 PM   If 7PM-7AM, please contact night-coverage [email or web address]

## 2022-01-19 NOTE — Care Management Important Message (Signed)
Important Message  Patient Details IM Letter placed in Patients room. Name: Drew Harrington MRN: [id number] Date of Birth: 05/19/1946   Medicare Important Message Given:  Yes     Kerin Salen 01/19/2022, 1:13 PM

## 2022-01-19 NOTE — Progress Notes (Signed)
Mobility Specialist - Progress Note   Pre-mobility: 89 SpO2 During mobility: 90 SpO2 Post-mobility: 100 SPO2    01/19/22 1439  Mobility  Activity Ambulated with assistance in hallway  Level of Assistance Minimal assist, patient does 75% or more  Assistive Device Front wheel walker  Distance Ambulated (ft) 550 ft  Activity Response Tolerated well  $Mobility charge 1 Mobility   Pt was agreeable to be mobilized. Pt received assistance with lines when getting up from chair. Pt had 89 SpO2 pre-mobility. Pt ambulated in hallway 589ft with RW. Had a 90 SpO2 during ambulation. Pt was returned to room and left on chair with a 100 SpO2. Pt was left with necessities in reach and family in the room.  Ferd Hibbs Mobility Specialist

## 2022-01-20 ENCOUNTER — Inpatient Hospital Stay: Payer: Medicare Other

## 2022-01-20 ENCOUNTER — Ambulatory Visit
Admission: RE | Admit: 2022-01-20 | Discharge: 2022-01-20 | Disposition: A | Discharge status: Home / Self Care | Payer: Medicare Other | Source: Ambulatory Visit | Attending: Radiation Oncology | Admitting: Radiation Oncology

## 2022-01-20 ENCOUNTER — Inpatient Hospital Stay: Payer: Medicare Other | Admitting: Internal Medicine

## 2022-01-20 ENCOUNTER — Other Ambulatory Visit (HOSPITAL_COMMUNITY): Payer: Self-pay

## 2022-01-20 ENCOUNTER — Ambulatory Visit: Payer: Medicare Other

## 2022-01-20 LAB — GLUCOSE, CAPILLARY: Glucose-Capillary: 97 mg/dL (ref 70–99)

## 2022-01-20 MED ORDER — ENOXAPARIN SODIUM 80 MG/0.8ML IJ SOSY: PREFILLED_SYRINGE | INTRAMUSCULAR | 1 refills | Status: DC

## 2022-01-20 NOTE — Discharge Instructions (Signed)
Can use OTC robitussin for the cough

## 2022-01-20 NOTE — Progress Notes (Signed)
Patient may be d/c'd once wife taught how to give lovenox shots. Eulogio Bear DO

## 2022-01-20 NOTE — Progress Notes (Signed)
Educated and taught wife how to administer pt's lovenox. Wife stated that she felt somewhat comfortable administering injection but it would take repetition to get better. Pt and wife provided and discussed discharge instructions. Addressed all questions and concerns. IV removed intact. Jerene Pitch

## 2022-01-20 NOTE — TOC Transition Note (Signed)
Transition of Care [facility]) - CM/SW Discharge Note   Patient Details  Name: Drew Harrington MRN: [id number] Date of Birth: 1945/09/06  [facility] of [facility]) CM/SW Contact:  Leeroy Cha, RN Phone Number: 01/20/2022, 8:48 AM   Clinical Narrative:    Patient dcd to home.  Rolling walker ordered at 0848 through [employer].  Will be delivered to the room.   Final next level of care: [place] Barriers to Discharge: Barriers Resolved   Patient Goals and CMS Choice Patient states their goals for this hospitalization and ongoing recovery are:: to go home CMS Medicare.gov Compare Post Acute Care list provided to:: Patient Choice offered to / list presented to : Patient  Discharge Placement                       Discharge Plan and Services   Discharge Planning Services: CM Consult Post Acute Care Choice: Durable Medical Equipment          DME Arranged: Gilford Rile rolling DME Agency: [employer] Date DME Agency Contacted: 01/20/22 Time DME Agency Contacted: [phone] Representative spoke with at DME Agency: [place]            Social Determinants of Health ([place]) Interventions     Readmission Risk Interventions     No data to display

## 2022-01-20 NOTE — Progress Notes (Signed)
Physical Therapy Treatment Patient Details Name: Drew Harrington MRN: [id number] DOB: Mar 16, 1946 Today's Date: 01/20/2022   History of Present Illness 76 yo male admitted with acute PE with R heart strain, LE DVT. Hx of DM, lung cancer    PT Comments    General Comments: AxO x 3 very pleasant, feeling "better".  Plans to D/C to home today. Spouse present during session.  Assisted with amb in hallway went well.  Avg RA with amb was 89% but increased to 96% after instructions on deep purse lip breathing.  Pt has a tendency to take short shallow breaths.  Tolerated a functional distance.  General Gait Details: tolerated a functional distance with proper use of walker with only one VC safety with turns. Pt plans to D/C to home with Spouse.   Recommendations for follow up therapy are one component of a multi-disciplinary discharge planning process, led by the attending physician.  Recommendations may be updated based on patient status, additional functional criteria and insurance authorization.  Follow Up Recommendations  Home health PT     Assistance Recommended at Discharge PRN  Patient can return home with the following A lot of help with walking and/or transfers;Assist for transportation;Assistance with cooking/housework   Equipment Recommendations  Rolling walker (2 wheels)    Recommendations for Other Services       Precautions / Restrictions Precautions Precautions: Fall Precaution Comments: monitor O2 Restrictions Weight Bearing Restrictions: No     Mobility  Bed Mobility               General bed mobility comments: OOb in recliner    Transfers Overall transfer level: Needs assistance Equipment used: None, Rolling walker (2 wheels) Transfers: Sit to/from Stand             General transfer comment: good use of B UE's to self rise from recliner    Ambulation/Gait Ambulation/Gait assistance: Supervision, Min guard Gait Distance (Feet): 145  Feet Assistive device: Rolling walker (2 wheels), None Gait Pattern/deviations: Step-through pattern, Decreased stride length Gait velocity: WFL     General Gait Details: tolerated a functional distance with proper use of walker with only one VC safety with turns.   Stairs             Wheelchair Mobility    Modified Rankin (Stroke Patients Only)       Balance                                            Cognition Arousal/Alertness: Awake/alert Behavior During Therapy: WFL for tasks assessed/performed Overall Cognitive Status: Within Functional Limits for tasks assessed                                 General Comments: AxO x 3 very pleasant, feeling "better".  Plans to D/C to home today.        Exercises      General Comments        Pertinent Vitals/Pain Pain Assessment Pain Assessment: No/denies pain    Home Living                          Prior Function            PT Goals (current goals can now be found in  the care plan section) Progress towards PT goals: Progressing toward goals    Frequency    Min 3X/week      PT Plan Current plan remains appropriate    Co-evaluation              AM-PAC PT "6 Clicks" Mobility   Outcome Measure  Help needed turning from your back to your side while in a flat bed without using bedrails?: None Help needed moving from lying on your back to sitting on the side of a flat bed without using bedrails?: None Help needed moving to and from a bed to a chair (including a wheelchair)?: None Help needed standing up from a chair using your arms (e.g., wheelchair or bedside chair)?: None Help needed to walk in hospital room?: A Little Help needed climbing 3-5 steps with a railing? : A Little 6 Click Score: 22    End of Session Equipment Utilized During Treatment: Gait belt Activity Tolerance: Patient tolerated treatment well Patient left: in chair;with call  bell/phone within reach;with family/visitor present Nurse Communication: Mobility status PT Visit Diagnosis: Unsteadiness on feet (R26.81);Muscle weakness (generalized) (M62.81)     Time: 1000-1024 PT Time Calculation (min) (ACUTE ONLY): 24 min  Charges:  $Gait Training: 8-22 mins $Therapeutic Activity: 8-22 mins                     Rica Koyanagi  PTA [place] [facility] M-F          [phone] Weekend pager [phone]

## 2022-01-21 ENCOUNTER — Ambulatory Visit: Payer: Medicare Other | Admitting: Pulmonary Disease

## 2022-01-21 ENCOUNTER — Telehealth: Payer: Self-pay | Admitting: Internal Medicine

## 2022-01-21 ENCOUNTER — Other Ambulatory Visit: Payer: Self-pay | Admitting: Internal Medicine

## 2022-01-21 ENCOUNTER — Encounter: Payer: Self-pay | Admitting: *Deleted

## 2022-01-21 ENCOUNTER — Telehealth: Payer: Self-pay | Admitting: *Deleted

## 2022-01-21 DIAGNOSIS — E119 Type 2 diabetes mellitus without complications: Secondary | ICD-10-CM

## 2022-01-21 NOTE — Patient Outreach (Signed)
  Care Coordination [facility] Note Transition Care Management Follow-up Telephone Call Date of discharge and from where: 01/20/22 [facility] How have you been since you were released from the hospital? Per spouse/ caregiver [place], on [facility] DPR: "He was very lethargic yesterday but today he is much better; we aren't having any real problems today; I am helping him with his medications and anything else he needs" Any questions or concerns? No  Items Reviewed: Did the pt receive and understand the discharge instructions provided? Yes  Medications obtained and verified? Yes  Other? No  Any new allergies since your discharge? No  Dietary orders reviewed? Yes Do you have support at home? Yes   Home Care and Equipment/Supplies: Were home health services ordered? no If so, what is the name of the agency? N/A  Has the agency set up a time to come to the patient's home? not applicable Were any new equipment or medical supplies ordered?  Yes: walker- was delivered to patient's hospital room prior to discharge home; confirms patient obtained and is using What is the name of the medical supply agency? [employer]  Were you able to get the supplies/equipment? yes Do you have any questions related to the use of the equipment or supplies? No  Functional Questionnaire: (I = Independent and D = Dependent) ADLs: I- wife assists minimally  Bathing/Dressing- I- wife assists minimally  Meal Prep- I- wife assists minimally  Eating- I- wife assists minimally  Maintaining continence- I- wife assists minimally; reports had one episode urinary incontinency yesterday, which has not resolved   Transferring/Ambulation- I- wife assists minimally  Managing Meds- D-- wife manages all aspects of medication administration  Follow up appointments reviewed:  PCP Hospital f/u appt confirmed? No  Scheduled to see - on - @ -. [facility] f/u appt confirmed? Yes  Scheduled to see oncology provider on 01/28/22 @ 11:00 Are  transportation arrangements needed? No  If their condition worsens, is the pt aware to call PCP or go to the Emergency Dept.? Yes Was the patient provided with contact information for the PCP's office or ED? Yes Was to pt encouraged to call back with questions or concerns? Yes  SDOH assessments and interventions completed:   Yes  Care Coordination Interventions Activated:  Yes Care Coordination Interventions:  PCP follow up appointment requested; e-mail sent to facilitate PCP appointment scheduling; reviewed discharge instructions and dosing of Lovenox with caregiver/ spouse   Encounter Outcome:  Pt. Visit Completed  Oneta Rack, RN, BSN, CCRN Alumnus RN [place] [facility]; [place] [facility] [phone]: direct office

## 2022-01-21 NOTE — Telephone Encounter (Signed)
.  Called patient to schedule appointment per 7/26 inbasket, patient wife is aware of date and time.

## 2022-01-22 ENCOUNTER — Other Ambulatory Visit: Payer: Self-pay | Admitting: Internal Medicine

## 2022-01-22 ENCOUNTER — Telehealth: Payer: Self-pay

## 2022-01-22 DIAGNOSIS — R918 Other nonspecific abnormal finding of lung field: Secondary | ICD-10-CM

## 2022-01-22 DIAGNOSIS — C3492 Malignant neoplasm of unspecified part of left bronchus or lung: Secondary | ICD-10-CM

## 2022-01-22 NOTE — Telephone Encounter (Signed)
Pts wife LVM requesting a return call. She states pt was recently discharged from the hospital and has an ongoing cough and "doesn't like it" and Robitussin didn't help. She request a call back.  I have called pts wife back and LVM advising I was returning the call and to please all [place] back if she still has concerns but If his cough worsens through the weekend  or he has difficulty breathing, given his recent dx, please go to the nearest ER immediately.

## 2022-01-24 ENCOUNTER — Ambulatory Visit (HOSPITAL_COMMUNITY)
Admission: RE | Admit: 2022-01-24 | Discharge: 2022-01-24 | Disposition: A | Discharge status: Home / Self Care | Payer: Medicare Other | Source: Ambulatory Visit | Attending: Internal Medicine | Admitting: Internal Medicine

## 2022-01-24 DIAGNOSIS — I739 Peripheral vascular disease, unspecified: Secondary | ICD-10-CM | POA: Diagnosis not present

## 2022-01-24 DIAGNOSIS — C3490 Malignant neoplasm of unspecified part of unspecified bronchus or lung: Secondary | ICD-10-CM | POA: Diagnosis not present

## 2022-01-24 DIAGNOSIS — I619 Nontraumatic intracerebral hemorrhage, unspecified: Secondary | ICD-10-CM | POA: Diagnosis not present

## 2022-01-24 DIAGNOSIS — G9389 Other specified disorders of brain: Secondary | ICD-10-CM | POA: Diagnosis not present

## 2022-01-24 MED ORDER — GADOBUTROL 1 MMOL/ML IV SOLN
7.0000 mL | Freq: Once | INTRAVENOUS | Status: AC | PRN
  Administered 2022-01-24: 7 mL via INTRAVENOUS

## 2022-01-25 ENCOUNTER — Telehealth: Payer: Self-pay | Admitting: Radiation Oncology

## 2022-01-25 NOTE — Telephone Encounter (Signed)
7/31 @ 8:46 am Left voicemail for patient to call our office to be reschedule for consult.

## 2022-01-25 NOTE — Progress Notes (Signed)
Order has been sent to [facility]. Confirmation that the order was received

## 2022-01-26 ENCOUNTER — Other Ambulatory Visit: Payer: Self-pay

## 2022-01-26 DIAGNOSIS — R918 Other nonspecific abnormal finding of lung field: Secondary | ICD-10-CM

## 2022-01-26 DIAGNOSIS — I2609 Other pulmonary embolism with acute cor pulmonale: Secondary | ICD-10-CM | POA: Diagnosis not present

## 2022-01-26 DIAGNOSIS — M6281 Muscle weakness (generalized): Secondary | ICD-10-CM | POA: Diagnosis not present

## 2022-01-27 ENCOUNTER — Encounter (HOSPITAL_COMMUNITY)
Admission: RE | Admit: 2022-01-27 | Discharge: 2022-01-27 | Disposition: A | Discharge status: Home / Self Care | Payer: Medicare Other | Source: Ambulatory Visit | Attending: Internal Medicine | Admitting: Internal Medicine

## 2022-01-27 DIAGNOSIS — C7972 Secondary malignant neoplasm of left adrenal gland: Secondary | ICD-10-CM | POA: Diagnosis not present

## 2022-01-27 DIAGNOSIS — C3490 Malignant neoplasm of unspecified part of unspecified bronchus or lung: Secondary | ICD-10-CM | POA: Insufficient documentation

## 2022-01-27 LAB — GLUCOSE, CAPILLARY: Glucose-Capillary: 126 mg/dL — ABNORMAL HIGH (ref 70–99)

## 2022-01-27 MED ORDER — FLUDEOXYGLUCOSE F - 18 (FDG) INJECTION
7.8500 | Freq: Once | INTRAVENOUS | Status: AC | PRN
  Administered 2022-01-27: 7.85 via INTRAVENOUS

## 2022-01-28 ENCOUNTER — Ambulatory Visit: Admission: RE | Admit: 2022-01-28 | Payer: Medicare Other | Source: Ambulatory Visit

## 2022-01-28 ENCOUNTER — Other Ambulatory Visit: Payer: Self-pay | Admitting: Internal Medicine

## 2022-01-28 ENCOUNTER — Inpatient Hospital Stay: Payer: Medicare Other | Attending: Internal Medicine | Admitting: Internal Medicine

## 2022-01-28 ENCOUNTER — Ambulatory Visit
Admission: RE | Admit: 2022-01-28 | Discharge: 2022-01-28 | Disposition: A | Discharge status: Home / Self Care | Payer: Medicare Other | Source: Ambulatory Visit | Attending: Radiation Oncology | Admitting: Radiation Oncology

## 2022-01-28 ENCOUNTER — Inpatient Hospital Stay: Payer: Medicare Other

## 2022-01-28 ENCOUNTER — Other Ambulatory Visit: Payer: Self-pay

## 2022-01-28 VITALS — BP 176/97 | HR 102 | Temp 98.2°F | Resp 16 | Wt 161.7 lb

## 2022-01-28 DIAGNOSIS — C786 Secondary malignant neoplasm of retroperitoneum and peritoneum: Secondary | ICD-10-CM | POA: Insufficient documentation

## 2022-01-28 DIAGNOSIS — C7951 Secondary malignant neoplasm of bone: Secondary | ICD-10-CM | POA: Insufficient documentation

## 2022-01-28 DIAGNOSIS — C3492 Malignant neoplasm of unspecified part of left bronchus or lung: Secondary | ICD-10-CM

## 2022-01-28 DIAGNOSIS — C782 Secondary malignant neoplasm of pleura: Secondary | ICD-10-CM | POA: Insufficient documentation

## 2022-01-28 DIAGNOSIS — C770 Secondary and unspecified malignant neoplasm of lymph nodes of head, face and neck: Secondary | ICD-10-CM | POA: Diagnosis not present

## 2022-01-28 DIAGNOSIS — Z5112 Encounter for antineoplastic immunotherapy: Secondary | ICD-10-CM | POA: Diagnosis not present

## 2022-01-28 DIAGNOSIS — Z86711 Personal history of pulmonary embolism: Secondary | ICD-10-CM | POA: Diagnosis not present

## 2022-01-28 DIAGNOSIS — R918 Other nonspecific abnormal finding of lung field: Secondary | ICD-10-CM

## 2022-01-28 DIAGNOSIS — Z79899 Other long term (current) drug therapy: Secondary | ICD-10-CM | POA: Insufficient documentation

## 2022-01-28 DIAGNOSIS — Z5111 Encounter for antineoplastic chemotherapy: Secondary | ICD-10-CM | POA: Diagnosis not present

## 2022-01-28 DIAGNOSIS — C3490 Malignant neoplasm of unspecified part of unspecified bronchus or lung: Secondary | ICD-10-CM | POA: Diagnosis not present

## 2022-01-28 DIAGNOSIS — Z7984 Long term (current) use of oral hypoglycemic drugs: Secondary | ICD-10-CM | POA: Diagnosis not present

## 2022-01-28 LAB — CBC WITH DIFFERENTIAL (CANCER CENTER ONLY)
Abs Immature Granulocytes: 0.02 10*3/uL (ref 0.00–0.07)
Basophils Absolute: 0.1 10*3/uL (ref 0.0–0.1)
Basophils Relative: 1 %
Eosinophils Absolute: 0.3 10*3/uL (ref 0.0–0.5)
Eosinophils Relative: 3 %
HCT: 40.6 % (ref 39.0–52.0)
Hemoglobin: 13.8 g/dL (ref 13.0–17.0)
Immature Granulocytes: 0 %
Lymphocytes Relative: 14 %
Lymphs Abs: 1.1 10*3/uL (ref 0.7–4.0)
MCH: 31.2 pg (ref 26.0–34.0)
MCHC: 34 g/dL (ref 30.0–36.0)
MCV: 91.9 fL (ref 80.0–100.0)
Monocytes Absolute: 0.6 10*3/uL (ref 0.1–1.0)
Monocytes Relative: 8 %
Neutro Abs: 6 10*3/uL (ref 1.7–7.7)
Neutrophils Relative %: 74 %
Platelet Count: 203 10*3/uL (ref 150–400)
RBC: 4.42 MIL/uL (ref 4.22–5.81)
RDW: 15.9 % — ABNORMAL HIGH (ref 11.5–15.5)
WBC Count: 8.1 10*3/uL (ref 4.0–10.5)
nRBC: 0 % (ref 0.0–0.2)

## 2022-01-28 LAB — CMP (CANCER CENTER ONLY)
ALT: 26 U/L (ref 0–44)
AST: 28 U/L (ref 15–41)
Albumin: 3.4 g/dL — ABNORMAL LOW (ref 3.5–5.0)
Alkaline Phosphatase: 148 U/L — ABNORMAL HIGH (ref 38–126)
Anion gap: 7 (ref 5–15)
BUN: 16 mg/dL (ref 8–23)
CO2: 27 mmol/L (ref 22–32)
Calcium: 9.1 mg/dL (ref 8.9–10.3)
Chloride: 107 mmol/L (ref 98–111)
Creatinine: 1.29 mg/dL — ABNORMAL HIGH (ref 0.61–1.24)
GFR, Estimated: 57 mL/min — ABNORMAL LOW (ref >60)
Glucose, Bld: 124 mg/dL — ABNORMAL HIGH (ref 70–99)
Potassium: 4 mmol/L (ref 3.5–5.1)
Sodium: 141 mmol/L (ref 135–145)
Total Bilirubin: 0.4 mg/dL (ref 0.3–1.2)
Total Protein: 6.7 g/dL (ref 6.5–8.1)

## 2022-01-28 MED ORDER — PROCHLORPERAZINE MALEATE 10 MG PO TABS: 10.0000 mg | ORAL_TABLET | Freq: Four times a day (QID) | ORAL | 0 refills | Status: DC | PRN

## 2022-01-28 MED ORDER — LIDOCAINE-PRILOCAINE 2.5-2.5 % EX CREA: TOPICAL_CREAM | CUTANEOUS | 0 refills | Status: DC

## 2022-01-28 MED ORDER — CYANOCOBALAMIN 1000 MCG/ML IJ SOLN
1000.0000 ug | Freq: Once | INTRAMUSCULAR | Status: AC
  Administered 2022-01-28: 1000 ug via INTRAMUSCULAR
  Filled 2022-01-28: qty 1

## 2022-01-28 MED ORDER — FOLIC ACID 1 MG PO TABS: 1.0000 mg | ORAL_TABLET | Freq: Every day | ORAL | 4 refills | Status: DC

## 2022-01-28 NOTE — Progress Notes (Signed)
START ON PATHWAY REGIMEN - Non-Small Cell Lung     A cycle is every 21 days:     Pembrolizumab      Pemetrexed      Carboplatin   **Always confirm dose/schedule in your pharmacy ordering system**  Patient Characteristics: Stage IV Metastatic, Nonsquamous, Molecular Analysis Completed, Molecular Alteration Present and Targeted Therapy Exhausted OR EGFR Exon 20+ or KRAS G12C+ or HER2+ Present and No Prior Chemo/Immunotherapy OR No Alteration Present, Initial  Chemotherapy/Immunotherapy, PS = 0, 1, No Alteration Present, No Alteration Present, Candidate for Immunotherapy, PD-L1 Expression Positive  ? 50% (TPS) and Immunotherapy Candidate Therapeutic Status: Stage IV Metastatic Histology: Nonsquamous Cell Broad Molecular Profiling Status: Engineer, manufacturing Analysis Results: No Alteration Present ECOG Performance Status: 1 Chemotherapy/Immunotherapy Line of Therapy: Initial Chemotherapy/Immunotherapy EGFR Exons 18-21 Mutation Testing Status: Completed and Negative ALK Fusion/Rearrangement Testing Status: Completed and Negative BRAF V600 Mutation Testing Status: Completed and Negative KRAS G12C Mutation Testing Status: Completed and Negative MET Exon 14 Mutation Testing Status: Completed and Negative RET Fusion/Rearrangement Testing Status: Completed and Negative HER2 Mutation Testing Status: Completed and Negative NTRK Fusion/Rearrangement Testing Status: Completed and Negative ROS1 Fusion/Rearrangement Testing Status: Completed and Negative Immunotherapy Candidate Status: Candidate for Immunotherapy PD-L1 Expression Status: PD-L1 Positive ? 50% (TPS) Intent of Therapy: Non-Curative / Palliative Intent, Discussed with Patient

## 2022-01-28 NOTE — Progress Notes (Signed)
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 2) pulmonary embolus involving large right main and multiple peripheral pulmonary emboli diagnosed in July 2023.  The molecular studies by [employer] showed no actionable mutations but PD-L1 expression is 99% Detected Alteration(s) / Biomarker(s) Associated FDA-approved therapies Clinical Trial Availability % cfDNA or Amplification KRAS G12V None Yes 8.1%  TP53 C242S None  Yes 1.1%  PRIOR THERAPY: None  CURRENT THERAPY: Systemic chemotherapy with carboplatin for AUC of 5, Alimta 500 Mg/M2 and Keytruda 200 Mg IV every 3 weeks.  First dose 02/03/2022.  INTERVAL HISTORY: Drew Harrington 76 y.o. male returns to the clinic today for follow-up visit accompanied by his wife and grandson.  The patient is feeling fine today with no concerning complaints except for the generalized fatigue and weakness and shortness of breath with exertion.  He denied having any current chest pain, but continues to have mild cough with no hemoptysis.  He has no nausea, vomiting, diarrhea or constipation.  He has no headache or visual changes.  He denied having any significant weight loss or night sweats.  He was recently diagnosed with pulmonary embolus involving the right lung mainly with other bilateral areas.  He is currently on treatment with Lovenox and tolerating it fairly well.  He had several studies performed recently  including bronchoscopy with biopsy of the left upper lobe lung mass and the final pathology was consistent with adenocarcinoma but features of adenosquamous carcinoma could not be completely excluded.  He also had MRI of the brain and PET scan performed recently.  The patient is here today for evaluation and recommendation regarding treatment of his condition.  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  MEDICATIONS:  Current Outpatient Medications  Medication Sig Dispense Refill   donepezil (ARICEPT) 5 MG tablet Take 5 mg by mouth at bedtime.     enoxaparin (LOVENOX) 80 MG/0.8ML injection Inject 0.7 mLs (70 mg total) into the skin every 12 hours. 48 mL 1   enoxaparin (LOVENOX) 80 MG/0.8ML injection Inject 0.7 mls into the skin every 12 hours 42 mL 1   losartan-hydrochlorothiazide (HYZAAR) 100-25 MG tablet TAKE 1 TABLET BY MOUTH DAILY 90 tablet 1   metFORMIN (GLUCOPHAGE) 1000 MG tablet TAKE 1 TABLET(1000 MG) BY MOUTH DAILY WITH BREAKFAST 90 tablet 3   metoprolol succinate (TOPROL-XL) 25 MG 24 hr tablet Take 1 tablet by mouth in the morning and at bedtime. [place]  tablet 0   potassium chloride SA (KLOR-CON) 20 MEQ tablet Take 1 tablet (20 mEq total) by mouth daily. 90 tablet 3   primidone (MYSOLINE) 250 MG tablet Take 250 mg by mouth See admin instructions. 2 tablets in AM and 3 tablets at bedtime     simvastatin (ZOCOR) 20 MG tablet TAKE 1 TABLET(20 MG) BY MOUTH  AT BEDTIME (Patient taking differently: Take 20 mg by mouth at bedtime.) 90 tablet 3   No current facility-administered medications for this visit.    SURGICAL HISTORY:  Past Surgical History:  Procedure Laterality Date   BRONCHIAL NEEDLE ASPIRATION BIOPSY  01/13/2022   Procedure: BRONCHIAL NEEDLE ASPIRATION BIOPSIES;  Surgeon: Garner Nash, DO;  Location: [facility];  Service: Pulmonary;;   CATARACT EXTRACTION W/ INTRAOCULAR LENS  IMPLANT, BILATERAL     COLONOSCOPY     removal cyst hip Left 05/23/2013   TRANSTHORACIC ECHOCARDIOGRAM  04-06-2006   LVSF NORMAL/  EF 60%/ AORTIC ROOT AT UPPER LIMITS OF NORMAL SIZE   TRANSURETHRAL RESECTION OF BLADDER TUMOR  10/08/2011   Procedure: CIRCUMCISION AND TRANSURETHRAL RESECTION OF BLADDER TUMOR (TURBT);  Surgeon: [name], MD;  Location[facility];  Service: Urology;  Laterality: N/A;   TRANSURETHRAL RESECTION OF BLADDER TUMOR  11/30/2011   Procedure: TRANSURETHRAL RESECTION OF BLADDER TUMOR (TURBT);  Surgeon: [name], MD;  Location: [facility];  Service: Urology;  Laterality: N/A;   VIDEO BRONCHOSCOPY WITH ENDOBRONCHIAL ULTRASOUND Left 01/13/2022   Procedure: VIDEO BRONCHOSCOPY WITH ENDOBRONCHIAL ULTRASOUND;  Surgeon: Garner Nash, DO;  Location: [facility];  Service: Pulmonary;  Laterality: Left;    REVIEW OF SYSTEMS:  Constitutional: positive for fatigue Eyes: negative Ears, nose, mouth, throat, and face: positive for voice change Respiratory: positive for cough and dyspnea on exertion Cardiovascular: negative Gastrointestinal: negative Genitourinary:negative Integument/breast: negative Hematologic/lymphatic: negative Musculoskeletal:negative Neurological: negative Behavioral/Psych: negative Endocrine: negative Allergic/Immunologic: negative   PHYSICAL EXAMINATION: General appearance: alert, cooperative, appears stated age, fatigued, and no distress Head:  Normocephalic, without obvious abnormality, atraumatic Neck: no adenopathy, no JVD, supple, symmetrical, trachea midline, and thyroid not enlarged, symmetric, no tenderness/mass/nodules Lymph nodes: Cervical, supraclavicular, and axillary nodes normal. Resp: clear to auscultation bilaterally Back: symmetric, no curvature. ROM normal. No CVA tenderness. Cardio: regular rate and rhythm, S1, S2 normal, no murmur, click, rub or gallop GI: soft, non-tender; bowel sounds normal; no masses,  no organomegaly Extremities: extremities normal, atraumatic, no cyanosis or edema Neurologic: Alert and oriented X 3, normal strength and tone. Normal symmetric reflexes. Normal coordination and gait  ECOG PERFORMANCE STATUS: 1 - Symptomatic but completely ambulatory  Blood pressure (!) 176/97, pulse (!) 102, temperature 98.2 F (36.8 C), temperature source Oral, resp. rate 16, weight 161 lb 11.2 oz (73.3 kg), SpO2 (!) 87 %.  LABORATORY DATA: Lab Results  Component Value Date   WBC 9.7 01/19/2022   HGB 13.8 01/19/2022   HCT 41.6 01/19/2022   MCV 91.2 01/19/2022   PLT 271 01/19/2022      Chemistry      Component Value Date/Time   NA 139 01/17/2022 0317   K 3.7 01/17/2022 0317   CL 106 01/17/2022 0317   CO2 24 01/17/2022 0317   BUN 12 01/17/2022 0317   CREATININE 1.03 01/17/2022 0317   CREATININE 1.39 (H) 01/06/2022 1117      Component Value Date/Time   CALCIUM 8.8 (L) 01/17/2022 0317   ALKPHOS 142 (H) 01/16/2022 0520   AST 29 01/16/2022 0520  AST 29 01/06/2022 1117   ALT 24 01/16/2022 0520   ALT 30 01/06/2022 1117   BILITOT 0.6 01/16/2022 0520   BILITOT 0.7 01/06/2022 1117       RADIOGRAPHIC STUDIES: NM PET Image Initial (PI) Skull Base To Thigh (F-18 FDG)  Result Date: 01/27/2022 CLINICAL DATA:  Initial treatment strategy for non-small cell lung cancer. EXAM: NUCLEAR MEDICINE PET SKULL BASE TO THIGH TECHNIQUE: 7.9 mCi F-18 FDG was injected intravenously. Full-ring PET imaging was  performed from the skull base to thigh after the radiotracer. CT data was obtained and used for attenuation correction and anatomic localization. Fasting blood glucose: 126 mg/dl COMPARISON:  Chest CT 01/15/2022 FINDINGS: Mediastinal blood pool activity: SUV max 2.4 Liver activity: SUV max NA NECK: Enlarged hypermetabolic RIGHT level II lymph node with SUV max equal 13.8 on image 24. Node measures 16 mm beneath sternocleidomastoid Hypermetabolic RIGHT supraglottic tissue adjacent to the epiglottis with SUV max equal 12.9 Incidental CT findings: none CHEST: Large LEFT upper lobe mass bordering the mediastinum measuring 10.5 x 6.1 cm. The mass is intensely hypermetabolic peripherally SUV max equal 20.4 Evidence of direct extension into the AP window with 3.8 cm mass with SUV max equal 17.2 Large layering LEFT effusion. Within the layering effusion there is hypermetabolic lesion medially along the pleural surface with SUV max equal 18.4 on image 106 Incidental CT findings: none ABDOMEN/PELVIS: Bilateral hypermetabolic adrenal metastasis. The LEFT adrenal gland is enlarged to 3 cm with SUV max equal 50.5 Two hypermetabolic peritoneal implants. One in the RIGHT abdomen measures 2.2 cm (142) with SUV max equal 14.3. Smaller lesion in the ventral peritoneal space on the LEFT measures 10 mm on image 148 SUV max equal 12.5. No evidence of bowel obstruction. Incidental CT findings: none SKELETON: Single focus of metabolic activity in the LEFT inferior pubic ramus with SUV max equal 12.4 on image 90. No CT correlation Incidental CT findings: none IMPRESSION: 1. Large LEFT upper lobe mass with intense metabolic activity bordering the mediastinum and transverse arch of the aorta consistent with bronchogenic carcinoma 2. AP window hypermetabolic mass. Pleural metastasis in the LEFT lower lobe. 3. Hypermetabolic RIGHT level II metastatic cervical lymph nodes. 4. Hypermetabolic RIGHT supraglottic tissue. Unusual location for  metastatic disease. Cannot exclude a primary lesion. 5. Bilateral hyper enlarged hypermetabolic adrenal metastasis. 6. Two  hypermetabolic peritoneal metastatic implants the abdomen 7. Single hypermetabolic skeletal metastasis to the LEFT inferior pubic ramus. Electronically Signed   By: Suzy Bouchard M.D.   On: 01/27/2022 16:49   MR BRAIN W WO CONTRAST  Result Date: 01/25/2022 CLINICAL DATA:  Non-small cell lung cancer staging EXAM: MRI HEAD WITHOUT AND WITH CONTRAST TECHNIQUE: Multiplanar, multiecho pulse sequences of the brain and surrounding structures were obtained without and with intravenous contrast. CONTRAST:  22m GADAVIST GADOBUTROL 1 MMOL/ML IV SOLN COMPARISON:  01/16/2022 FINDINGS: Brain: No enhancement or edema to suggest metastatic disease. No recent infarction, hemorrhage, hydrocephalus, extra-axial collection or mass lesion. Advanced chronic small vessel ischemia with ischemic gliosis and chronic small vessel infarcts in the left basal ganglia, bilateral thalamus, and right pons. Small remote left occipital cortex infarct. Chronic microhemorrhages in the deep brain attributed to the same. Vascular: Major flow voids and vascular enhancements are preserved Skull and upper cervical spine: No focal marrow lesion. Sinuses/Orbits: Negative Other: Partially covered mass in the right lateral neck with heterogeneous/necrotic appearance and 3.4 cm size, likely nodal. IMPRESSION: 1. Heterogeneous mass in the right lateral neck, presumably nodal metastasis. 2. Negative for  intracranial metastasis. 3. Advanced chronic small vessel disease. Electronically Signed   By: Jorje Guild M.D.   On: 01/25/2022 14:33   MR BRAIN WO CONTRAST  Result Date: 01/16/2022 CLINICAL DATA:  Non-small cell lung cancer staging EXAM: MRI HEAD WITHOUT CONTRAST TECHNIQUE: Multiplanar, multiecho pulse sequences of the brain and surrounding structures were obtained without intravenous contrast. COMPARISON:  Head CT 12/30/2021  FINDINGS: Brain: No swelling or masslike finding to suggest metastatic disease. Chronic small vessel ischemic gliosis in the cerebral white matter with chronic perforator infarct at the left basal ganglia. Chronic lacunar infarcts in the bilateral thalami with numerous remote micro hemorrhages in the deep brain. Small remote left occipital and right parietal cortex infarcts. Wallerian changes seen in the bilateral middle cerebellar peduncle associated with a chronic right pontine lacune. No acute hemorrhage, acute infarct, hydrocephalus, or collection. Vascular: Major flow voids are preserved Skull and upper cervical spine: Normal marrow signal Sinuses/Orbits: Negative IMPRESSION: 1. Limited for staging purposes due to lack of IV contrast. No gross masslike features. 2. Advanced chronic small vessel ischemia. Electronically Signed   By: Jorje Guild M.D.   On: 01/16/2022 12:07   VAS [place] LOWER EXTREMITY VENOUS (DVT)  Result Date: 01/16/2022  Lower Venous DVT Study Patient Name:  Drew Harrington  Date of Exam:   01/15/2022 Medical Rec #: [id number]          Accession #:    [id number] Date of Birth: [birth year]           Patient Gender: M Patient Age:   2 years Exam Location:  [facility] Procedure:      VAS [place] LOWER EXTREMITY VENOUS (DVT) Referring Phys: Nevin Bloodgood SIMPSON --------------------------------------------------------------------------------  Indications: Pulmonary embolism.  Risk Factors: Confirmed PE. Anticoagulation: Heparin. Comparison Study: No prior studies. Performing Technologist: Oliver Hum RVT  Examination Guidelines: A complete evaluation includes B-mode imaging, spectral Doppler, color Doppler, and power Doppler as needed of all accessible portions of each vessel. Bilateral testing is considered an integral part of a complete examination. Limited examinations for reoccurring indications may be performed as noted. The reflux portion of the exam is performed with the patient in  reverse Trendelenburg.  +---------+---------------+---------+-----------+----------+--------------+ RIGHT    CompressibilityPhasicitySpontaneityPropertiesThrombus Aging +---------+---------------+---------+-----------+----------+--------------+ CFV      Full           Yes      Yes                                 +---------+---------------+---------+-----------+----------+--------------+ SFJ      Full                                                        +---------+---------------+---------+-----------+----------+--------------+ FV Prox  Full                                                        +---------+---------------+---------+-----------+----------+--------------+ FV Mid   Full                                                        +---------+---------------+---------+-----------+----------+--------------+  FV DistalPartial        Yes      No                   Acute          +---------+---------------+---------+-----------+----------+--------------+ PFV      Full                                                        +---------+---------------+---------+-----------+----------+--------------+ POP      None           No       No                   Acute          +---------+---------------+---------+-----------+----------+--------------+ PTV      Full                                                        +---------+---------------+---------+-----------+----------+--------------+ PERO     Partial                                      Acute          +---------+---------------+---------+-----------+----------+--------------+ Soleal   Partial                                      Acute          +---------+---------------+---------+-----------+----------+--------------+   +---------+---------------+---------+-----------+----------+--------------+ LEFT     CompressibilityPhasicitySpontaneityPropertiesThrombus Aging  +---------+---------------+---------+-----------+----------+--------------+ CFV      Full           Yes      Yes                                 +---------+---------------+---------+-----------+----------+--------------+ SFJ      Full                                                        +---------+---------------+---------+-----------+----------+--------------+ FV Prox  Full                                                        +---------+---------------+---------+-----------+----------+--------------+ FV Mid   Full                                                        +---------+---------------+---------+-----------+----------+--------------+ FV DistalFull                                                        +---------+---------------+---------+-----------+----------+--------------+  PFV      Full                                                        +---------+---------------+---------+-----------+----------+--------------+ POP      Full           Yes      Yes                                 +---------+---------------+---------+-----------+----------+--------------+ PTV      Full                                                        +---------+---------------+---------+-----------+----------+--------------+ PERO     Full                                                        +---------+---------------+---------+-----------+----------+--------------+     Summary: RIGHT: - Findings consistent with acute deep vein thrombosis involving the right femoral vein, right popliteal vein, right peroneal veins, and right soleal veins. - No cystic structure found in the popliteal fossa.  LEFT: - There is no evidence of deep vein thrombosis in the lower extremity.  - No cystic structure found in the popliteal fossa.  *See table(s) above for measurements and observations. Electronically signed by Orlie Pollen on 01/16/2022 at 12:00:16 PM.    Final     ECHOCARDIOGRAM COMPLETE  Result Date: 01/15/2022    ECHOCARDIOGRAM REPORT   Patient Name:   Drew Harrington Date of Exam: 01/15/2022 Medical Rec #:  [id number]         Height:       69.0 in Accession #:    [id number]        Weight:       167.5 lb Date of Birth:  08-17-1945          BSA:          1.916 m Patient Age:    32 years          BP:           125/97 mmHg Patient Gender: M                 HR:           93 bpm. Exam Location:  Inpatient Procedure: 2D Echo, Cardiac Doppler and Color Doppler Indications:    Pulmonary embolus  History:        Patient has prior history of Echocardiogram examinations. Risk                 Factors:Hypertension and Diabetes.  Sonographer:    Jyl Heinz Referring Phys: [id number] [place]  1. Cor pulmonale with severe TR and RV dysfucntion in setting of clinical PE.  2. Left ventricular ejection fraction, by estimation, is 55%. The left ventricle has normal function. The left ventricle has no regional wall motion abnormalities. Left ventricular diastolic  parameters are consistent with Grade I diastolic dysfunction (impaired relaxation).  3. Right ventricular systolic function is severely reduced. The right ventricular size is normal.  4. The mitral valve is abnormal. Trivial mitral valve regurgitation. No evidence of mitral stenosis.  5. Tricuspid valve regurgitation is severe.  6. The aortic valve is tricuspid. There is mild calcification of the aortic valve. There is mild thickening of the aortic valve. Aortic valve regurgitation is not visualized. Aortic valve sclerosis is present, with no evidence of aortic valve stenosis.  7. The inferior vena cava is dilated in size with >50% respiratory variability, suggesting right atrial pressure of 8 mmHg. FINDINGS  Left Ventricle: Left ventricular ejection fraction, by estimation, is 55%. The left ventricle has normal function. The left ventricle has no regional wall motion abnormalities. The left ventricular internal  cavity size was normal in size. There is no left ventricular hypertrophy. Left ventricular diastolic parameters are consistent with Grade I diastolic dysfunction (impaired relaxation). Right Ventricle: The right ventricular size is normal. No increase in right ventricular wall thickness. Right ventricular systolic function is severely reduced. Left Atrium: Left atrial size was normal in size. Right Atrium: Right atrial size was normal in size. Pericardium: There is no evidence of pericardial effusion. Mitral Valve: The mitral valve is abnormal. There is mild thickening of the mitral valve leaflet(s). There is mild calcification of the mitral valve leaflet(s). Trivial mitral valve regurgitation. No evidence of mitral valve stenosis. Tricuspid Valve: The tricuspid valve is normal in structure. Tricuspid valve regurgitation is severe. No evidence of tricuspid stenosis. Aortic Valve: The aortic valve is tricuspid. There is mild calcification of the aortic valve. There is mild thickening of the aortic valve. Aortic valve regurgitation is not visualized. Aortic valve sclerosis is present, with no evidence of aortic valve stenosis. Aortic valve peak gradient measures 3.3 mmHg. Pulmonic Valve: The pulmonic valve was normal in structure. Pulmonic valve regurgitation is trivial. No evidence of pulmonic stenosis. Aorta: The aortic root is normal in size and structure. Venous: The inferior vena cava is dilated in size with greater than 50% respiratory variability, suggesting right atrial pressure of 8 mmHg. IAS/Shunts: No atrial level shunt detected by color flow Doppler. 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  LEFT VENTRICLE PLAX 2D LVIDd:         3.60 cm     Diastology LVIDs:         2.30 cm     LV e' medial:    5.33 cm/s LV PW:         1.30 cm     LV E/e' medial:  14.5 LV IVS:        1.40 cm     LV e' lateral:   5.98 cm/s LVOT diam:     2.00 cm     LV E/e' lateral:  12.9 LV SV:         40 LV SV Index:   21 LVOT Area:     3.14 cm  LV Volumes (MOD) LV vol d, MOD A2C: 76.4 ml LV vol d, MOD A4C: 72.7 ml LV vol s, MOD A2C: 28.5 ml LV vol s, MOD A4C: 30.2 ml LV SV MOD A2C:     47.9 ml LV SV MOD A4C:     72.7 ml LV SV MOD BP:      43.1 ml RIGHT VENTRICLE            IVC RV  Basal diam:  3.70 cm    IVC diam: 1.90 cm RV Mid diam:    2.70 cm RV S prime:     8.27 cm/s TAPSE (M-mode): 1.8 cm LEFT ATRIUM           Index        RIGHT ATRIUM           Index LA diam:      2.70 cm 1.41 cm/m   RA Area:     18.20 cm LA Vol (A2C): 26.0 ml 13.57 ml/m  RA Volume:   54.90 ml  28.65 ml/m LA Vol (A4C): 38.7 ml 20.20 ml/m  AORTIC VALVE                 PULMONIC VALVE AV Area (Vmax): 2.87 cm     PR End Diast Vel: 1.41 msec AV Vmax:        91.50 cm/s AV Peak Grad:   3.3 mmHg LVOT Vmax:      83.60 cm/s LVOT Vmean:     66.400 cm/s LVOT VTI:       0.126 m  AORTA Ao Root diam: 3.60 cm Ao Asc diam:  3.70 cm MITRAL VALVE               TRICUSPID VALVE MV Area (PHT): 10.54 cm   TR Peak grad:   41.7 mmHg MV Decel Time: 72 msec     TR Vmax:        323.00 cm/s MV E velocity: 77.10 cm/s MV A velocity: 76.30 cm/s  SHUNTS MV E/A ratio:  1.01        Systemic VTI:  0.13 m                            Systemic Diam: 2.00 cm Jenkins Rouge MD Electronically signed by Jenkins Rouge MD Signature Date/Time: 01/15/2022/2:01:41 PM    Final    CT Angio Chest PE W and/or Wo Contrast  Result Date: 01/15/2022 CLINICAL DATA:  Pulmonary embolus suspected with high probability. Productive cough, low oxygen saturation, recent diagnosis of lung cancer. EXAM: CT ANGIOGRAPHY CHEST WITH CONTRAST TECHNIQUE: Multidetector CT imaging of the chest was performed using the standard protocol during bolus administration of intravenous contrast. Multiplanar CT image reconstructions and MIPs were obtained to evaluate the vascular anatomy. RADIATION DOSE REDUCTION: This exam was performed according to the departmental dose-optimization program  which includes automated exposure control, adjustment of the mA and/or kV according to patient size and/or use of iterative reconstruction technique. CONTRAST:  42m OMNIPAQUE IOHEXOL 350 MG/ML SOLN COMPARISON:  12/30/2021 FINDINGS: Cardiovascular: Large filling defects are demonstrated in the right main pulmonary artery and extending into upper and lower lobe branches consistent with acute pulmonary embolus. Finding is new since prior study. Cardiac enlargement. No pericardial effusion. Elevated RV to LV ratio at 1.5. Normal caliber thoracic aorta. There is eccentric thrombus in the aortic arch and descending aorta, similar to prior study. Mediastinum/Nodes: Esophagus is decompressed. Lymphadenopathy in the left hilum and left paratracheal region, similar to prior study. Thyroid gland is unremarkable. Lungs/Pleura: Large left apical mass extending to the left hilum and surrounding the left subclavian artery with partially surrounding the left carotid artery origin. Mass measures up to about 7.2 x 8 cm in diameter. This is likely to represent primary lung cancer. There is atelectasis or consolidation in the left upper lung and left lower lung. Moderate left pleural effusion. Patchy peripheral wedge-shaped  opacities are demonstrated in the right upper lung, new since prior study, possibly peripheral infarcts. Upper Abdomen: Large left adrenal gland nodule measuring 3.8 cm diameter, unchanged since prior study and likely metastatic. Circumscribed low-attenuation lesion centrally in the liver measuring 3.6 cm diameter is unchanged since prior study, likely representing a benign cyst. Musculoskeletal: No chest wall abnormality. No acute or significant osseous findings. Review of the MIP images confirms the above findings. IMPRESSION: 1. Positive examination for pulmonary embolus with large right main and multiple peripheral pulmonary emboli. Elevated RV to LV ratio at 1.5 suggesting right heart strain. Positive for  acute PE with CT evidence of right heart strain (RV/LV Ratio = 1.5) consistent with at least submassive (intermediate risk) PE. The presence of right heart strain has been associated with an increased risk of morbidity and mortality. Please refer to the "Code PE Focused" order set in EPIC. 2. Again demonstrated is a large left apical/hilar/mediastinal mass lesion likely representing primary lung cancer with lymphadenopathy and direct mediastinal invasion. 3.8 cm left adrenal gland nodule most likely represents a metastasis. 3. Moderate left pleural effusion with atelectasis or infiltration in the left lung. 4. Probable small peripheral infarcts in the right lung. Critical Value/emergent results were called by telephone at the time of interpretation on 01/15/2022 at 1:25 am to provider Tegler, who verbally acknowledged these results. Electronically Signed   By: Lucienne Capers M.D.   On: 01/15/2022 01:37   DG Chest Port 1 View  Result Date: 01/15/2022 CLINICAL DATA:  Shortness of breath, cough with bloody sputum. Recent diagnosis of lung cancer with biopsy. EXAM: PORTABLE CHEST 1 VIEW COMPARISON:  12/25/2021. FINDINGS: The heart size and mediastinal contours are stable. A consolidative opacity is noted in the left upper lobe at the paramediastinal border and is unchanged from the prior exam. Mild atelectasis is present at the left lung base. The right lung is clear. No effusion or pneumothorax. No acute osseous abnormality. IMPRESSION: 1. Stable mass in the left upper lobe along the paramediastinal border. 2. Mild atelectasis at the left lung base. Electronically Signed   By: Brett Fairy M.D.   On: 01/15/2022 00:12   CT Chest W Contrast  Result Date: 12/30/2021 CLINICAL DATA:  Abnormal chest radiograph, lung mass. History of bladder cancer. Weight loss. EXAM: CT CHEST WITH CONTRAST TECHNIQUE: Multidetector CT imaging of the chest was performed during intravenous contrast administration. RADIATION DOSE  REDUCTION: This exam was performed according to the departmental dose-optimization program which includes automated exposure control, adjustment of the mA and/or kV according to patient size and/or use of iterative reconstruction technique. CONTRAST:  4m ISOVUE-300 IOPAMIDOL (ISOVUE-300) INJECTION 61% COMPARISON:  None Available. FINDINGS: Cardiovascular: Peripheral filling defect is seen at the bifurcation of the right upper lobe and interlobar pulmonary artery (6/190 3-212). There is eccentric soft tissue along the lateral wall of the distal aortic arch and proximal descending thoracic aorta. Markedly narrowed or obstructed left subclavian vein with extensive collateral venous flow. Atherosclerotic calcification of the aorta and coronary arteries. Enlarged pulmonic trunk. Heart size normal. Left ventricular hypertrophy. No pericardial effusion. Mediastinum/Nodes: 14 mm low-attenuation left thyroid nodule. No follow-up recommended. (Ref: J Am Coll Radiol. 2015 Feb;12(2): 143-50).Necrotic appearing left supraclavicular lymph node measures 1.6 cm (2/17). AP window lymph nodes measure up to 1.3 cm. Low left paratracheal lymph node measures 9 mm. There is left-sided mediastinal invasion by a large necrotic mass in the left upper lobe. Left hilar adenopathy measures up to 1.5 cm. No right hilar  or axillary adenopathy. Esophagus is grossly unremarkable. Lungs/Pleura: Centrilobular and paraseptal emphysema. Large necrotic mass in the apical left upper lobe measures 6.4 x 7.1 cm. Associated mediastinal and probable thoracic inlet invasion. Septal thickening and ground-glass in the apical left upper lobe. Additional ground-glass in the posterior aspect of the left upper lobe and lingula as well as left lower lobe. Findings suggest a component of pneumonitis/lymphedema. Minimal dependent atelectasis in the right lung. Small left pleural effusion. Upper Abdomen: Liver is decreased in attenuation diffusely. Low-attenuation  lesions in the liver measure up to 3.7 cm and are likely cysts. Numerous stones and/or calcified polyps in the gallbladder. Nodular thickening of the right adrenal gland. Left adrenal mass measures 2.6 x 3.9 cm. 1.9 cm fluid density cyst in the right kidney. No follow-up necessary. Partially imaged 1.4 cm low-attenuation lesion in the left kidney. No specific follow-up necessary. Spleen, pancreas, stomach and bowel are grossly unremarkable. Small gastrohepatic ligament lymph nodes. Musculoskeletal: Degenerative changes in the spine. No worrisome lytic or sclerotic lesions. IMPRESSION: 1. Large necrotic appearing left upper lobe mass with mediastinal and suspected thoracic inlet invasion, left supraclavicular/mediastinal/left hilar adenopathy and left adrenal mass, compatible with stage IV lung cancer. 2. 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 3. Probable bland peripheral tumor thrombus within the distal aortic arch and proximal descending thoracic aorta. 4. Peripheral filling defect at the bifurcation of the right upper lobe and interlobar pulmonary arteries, indicative of a chronic pulmonary embolus. These results will be called to the ordering clinician or representative by the Radiologist Assistant, and communication documented in the PACS or [employer]. 5. Left upper lobe mass exerts mass effect on the left subclavian vein which is markedly narrowed or obstructed with extensive collateral venous flow. 6. Small left pleural effusion. 7. Hepatic steatosis. 8. Gallstones and/or gallbladder polyps. 9. Aortic atherosclerosis (ICD10-I70.0). Coronary artery calcification. 10. Enlarged pulmonic trunk, indicative of pulmonary arterial hypertension. 11.  Emphysema (ICD10-J43.9). Electronically Signed   By: Lorin Picket M.D.   On: 12/30/2021 16:35   CT HEAD WO CONTRAST (5MM)  Result Date: 12/30/2021 CLINICAL DATA:  Neuro deficit, acute, stroke suspected. Memory changes/memory loss. Right leg weakness. EXAM:  CT HEAD WITHOUT CONTRAST TECHNIQUE: Contiguous axial images were obtained from the base of the skull through the vertex without intravenous contrast. RADIATION DOSE REDUCTION: This exam was performed according to the departmental dose-optimization program which includes automated exposure control, adjustment of the mA and/or kV according to patient size and/or use of iterative reconstruction technique. COMPARISON:  Head MRI 08/01/2021 FINDINGS: Brain: There is no evidence of an acute infarct, intracranial hemorrhage, mass, midline shift, or extra-axial fluid collection. 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 Hypodensities in the cerebral white matter bilaterally are nonspecific but compatible with moderate chronic small vessel ischemic disease. There is mild cerebral atrophy. Vascular: Calcified atherosclerosis at the skull base. No hyperdense vessel. Skull: No fracture or suspicious osseous lesion. Sinuses/Orbits: Similar mucosal thickening in the paranasal sinuses, moderate in the posterior left ethmoid air cells. Clear mastoid air cells. Bilateral cataract extraction. Other: None. IMPRESSION: 1. No evidence of acute intracranial abnormality. 2. 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 Electronically Signed   By: Logan Bores M.D.   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 The molecular studies by [employer] showed no  actionable mutations but PD-L1 expression is 99% I had a lengthy discussion with the patient and his family today about his current condition and treatment options. I personally and independently reviewed the scan images and discussed the result and showed the images to the patient and his family today. I explained to the patient that he has incurable condition and all the treatment will be of palliative nature. The patient was given the option of palliative care and hospice referral versus consideration of palliative systemic therapy with either single agent monotherapy with Keytruda or Libtayo (Cempilimab) because of the high PD-L1 expression versus treatment with a combination of systemic chemotherapy with carboplatin, Alimta and Keytruda or Libtayo (Cempilimab) because of the bulky disease. I discussed with the patient the adverse effect of this treatment including but not limited to alopecia, myelosuppression, nausea and vomiting, peripheral neuropathy, liver or renal dysfunction as well as immunotherapy adverse effects. The patient is interested in proceeding with systemic chemotherapy in combination with immunotherapy.  He is expected to start the first cycle of this treatment on February 03, 2022. I will arrange for the patient to have a chemotherapy education class before the first dose of his treatment. We will also arrange for the patient to receive vitamin B12 injection today. I will call his pharmacy with prescription for Compazine 10 mg p.o. every 6 hours as needed for nausea in addition to folic acid 1 mg p.o. daily and Emla cream to be applied to the Port-A-Cath site before treatment. I will refer the patient to IR for consideration of Port-A-Cath placement. 2) Pulmonary embolus involving large right main and multiple peripheral pulmonary emboli diagnosed in July 2023.  He is currently on treatment with Lovenox 1 Mg/KG every 12 hours.  He will continue on this treatment for at least 1-2  months before switching him to Eliquis orally. The patient will come back for follow-up visit in 2 weeks for evaluation and management of any adverse effect of his treatment. He was advised to call immediately if he has any other concerning symptoms in the interval. The patient voices understanding of current disease status and treatment options and is in agreement with the current care plan.  All questions were answered. The patient knows to call the clinic with any problems, questions or concerns. We can certainly see the patient much sooner if necessary.  The total time spent in the appointment was 55 minutes.  Disclaimer: This note was dictated with voice recognition software. Similar sounding words can inadvertently be transcribed and may not be corrected upon review.

## 2022-01-29 ENCOUNTER — Other Ambulatory Visit: Payer: Self-pay

## 2022-02-01 ENCOUNTER — Other Ambulatory Visit: Payer: Self-pay

## 2022-02-01 ENCOUNTER — Telehealth: Payer: Self-pay

## 2022-02-01 DIAGNOSIS — C3492 Malignant neoplasm of unspecified part of left bronchus or lung: Secondary | ICD-10-CM

## 2022-02-01 NOTE — Telephone Encounter (Signed)
Pts wife called stating pt c/o pain when he urinates.  I've scheduled the pt for a lab appt for a UA tomorrow.

## 2022-02-02 ENCOUNTER — Inpatient Hospital Stay: Payer: Medicare Other

## 2022-02-02 ENCOUNTER — Telehealth: Payer: Self-pay | Admitting: Internal Medicine

## 2022-02-02 ENCOUNTER — Telehealth: Payer: Self-pay | Admitting: Medical Oncology

## 2022-02-02 NOTE — Telephone Encounter (Signed)
I spoke with pt and his wife. Pt declined to present today for a UA given he has pain/discomfort with urination. Pt is aware he has a pt education appt tomorrow 02/03/22 and has agreed to submit a sample at that time.   Pt initially stated he wants to cancel and doesn't want to move forward with chemo and inquired about hospice. After talking to the pt and his wife for awhile, pt decided to attend the education appt and will decide from there if he wants to move forward with tx. Pt understands if he wishes to move forward with tx, we are here to help, and he can decide at anytime to discontinue if he wants and we will obtain hospice services for him. Pt and his wife expressed understanding of this information and thanked me for the conversation.

## 2022-02-02 NOTE — Telephone Encounter (Signed)
Lab appt r/s to 1430. His wife was notified.

## 2022-02-03 ENCOUNTER — Other Ambulatory Visit: Payer: Medicare Other

## 2022-02-03 ENCOUNTER — Other Ambulatory Visit: Payer: Self-pay

## 2022-02-03 ENCOUNTER — Inpatient Hospital Stay: Payer: Medicare Other

## 2022-02-03 VITALS — BP 147/93 | HR 100 | Temp 97.7°F | Resp 18 | Wt 158.5 lb

## 2022-02-03 DIAGNOSIS — A419 Sepsis, unspecified organism: Secondary | ICD-10-CM | POA: Diagnosis not present

## 2022-02-03 DIAGNOSIS — C3492 Malignant neoplasm of unspecified part of left bronchus or lung: Secondary | ICD-10-CM

## 2022-02-03 DIAGNOSIS — R55 Syncope and collapse: Secondary | ICD-10-CM | POA: Diagnosis not present

## 2022-02-03 DIAGNOSIS — I82C11 Acute embolism and thrombosis of right internal jugular vein: Secondary | ICD-10-CM | POA: Diagnosis not present

## 2022-02-03 DIAGNOSIS — T82838A Hemorrhage of vascular prosthetic devices, implants and grafts, initial encounter: Secondary | ICD-10-CM | POA: Diagnosis not present

## 2022-02-03 DIAGNOSIS — C786 Secondary malignant neoplasm of retroperitoneum and peritoneum: Secondary | ICD-10-CM | POA: Diagnosis not present

## 2022-02-03 DIAGNOSIS — Z743 Need for continuous supervision: Secondary | ICD-10-CM | POA: Diagnosis not present

## 2022-02-03 DIAGNOSIS — R000 Tachycardia, unspecified: Secondary | ICD-10-CM | POA: Diagnosis not present

## 2022-02-03 DIAGNOSIS — D6832 Hemorrhagic disorder due to extrinsic circulating anticoagulants: Secondary | ICD-10-CM | POA: Diagnosis not present

## 2022-02-03 DIAGNOSIS — F0390 Unspecified dementia without behavioral disturbance: Secondary | ICD-10-CM | POA: Diagnosis present

## 2022-02-03 DIAGNOSIS — M6281 Muscle weakness (generalized): Secondary | ICD-10-CM | POA: Diagnosis not present

## 2022-02-03 DIAGNOSIS — G9340 Encephalopathy, unspecified: Secondary | ICD-10-CM | POA: Diagnosis not present

## 2022-02-03 DIAGNOSIS — R197 Diarrhea, unspecified: Secondary | ICD-10-CM | POA: Diagnosis not present

## 2022-02-03 DIAGNOSIS — R846 Abnormal cytological findings in specimens from respiratory organs and thorax: Secondary | ICD-10-CM | POA: Diagnosis not present

## 2022-02-03 DIAGNOSIS — R918 Other nonspecific abnormal finding of lung field: Secondary | ICD-10-CM | POA: Diagnosis not present

## 2022-02-03 DIAGNOSIS — D701 Agranulocytosis secondary to cancer chemotherapy: Secondary | ICD-10-CM | POA: Diagnosis not present

## 2022-02-03 DIAGNOSIS — I499 Cardiac arrhythmia, unspecified: Secondary | ICD-10-CM | POA: Diagnosis not present

## 2022-02-03 DIAGNOSIS — J90 Pleural effusion, not elsewhere classified: Secondary | ICD-10-CM | POA: Diagnosis not present

## 2022-02-03 DIAGNOSIS — R6520 Severe sepsis without septic shock: Secondary | ICD-10-CM | POA: Diagnosis not present

## 2022-02-03 DIAGNOSIS — Z515 Encounter for palliative care: Secondary | ICD-10-CM | POA: Diagnosis not present

## 2022-02-03 DIAGNOSIS — C7972 Secondary malignant neoplasm of left adrenal gland: Secondary | ICD-10-CM | POA: Diagnosis not present

## 2022-02-03 DIAGNOSIS — C7951 Secondary malignant neoplasm of bone: Secondary | ICD-10-CM | POA: Diagnosis not present

## 2022-02-03 DIAGNOSIS — J939 Pneumothorax, unspecified: Secondary | ICD-10-CM | POA: Diagnosis not present

## 2022-02-03 DIAGNOSIS — I82409 Acute embolism and thrombosis of unspecified deep veins of unspecified lower extremity: Secondary | ICD-10-CM | POA: Diagnosis not present

## 2022-02-03 DIAGNOSIS — W182XXA Fall in (into) shower or empty bathtub, initial encounter: Secondary | ICD-10-CM | POA: Diagnosis present

## 2022-02-03 DIAGNOSIS — Z86711 Personal history of pulmonary embolism: Secondary | ICD-10-CM | POA: Diagnosis not present

## 2022-02-03 DIAGNOSIS — E1122 Type 2 diabetes mellitus with diabetic chronic kidney disease: Secondary | ICD-10-CM | POA: Diagnosis not present

## 2022-02-03 DIAGNOSIS — D709 Neutropenia, unspecified: Secondary | ICD-10-CM | POA: Diagnosis not present

## 2022-02-03 DIAGNOSIS — I10 Essential (primary) hypertension: Secondary | ICD-10-CM | POA: Diagnosis not present

## 2022-02-03 DIAGNOSIS — Z452 Encounter for adjustment and management of vascular access device: Secondary | ICD-10-CM | POA: Diagnosis not present

## 2022-02-03 DIAGNOSIS — C3490 Malignant neoplasm of unspecified part of unspecified bronchus or lung: Secondary | ICD-10-CM | POA: Diagnosis not present

## 2022-02-03 DIAGNOSIS — C7971 Secondary malignant neoplasm of right adrenal gland: Secondary | ICD-10-CM | POA: Diagnosis not present

## 2022-02-03 DIAGNOSIS — N1831 Chronic kidney disease, stage 3a: Secondary | ICD-10-CM | POA: Diagnosis not present

## 2022-02-03 DIAGNOSIS — N189 Chronic kidney disease, unspecified: Secondary | ICD-10-CM | POA: Diagnosis not present

## 2022-02-03 DIAGNOSIS — D61810 Antineoplastic chemotherapy induced pancytopenia: Secondary | ICD-10-CM | POA: Diagnosis not present

## 2022-02-03 DIAGNOSIS — I6381 Other cerebral infarction due to occlusion or stenosis of small artery: Secondary | ICD-10-CM | POA: Diagnosis not present

## 2022-02-03 DIAGNOSIS — D61818 Other pancytopenia: Secondary | ICD-10-CM | POA: Diagnosis not present

## 2022-02-03 DIAGNOSIS — G9341 Metabolic encephalopathy: Secondary | ICD-10-CM | POA: Diagnosis not present

## 2022-02-03 DIAGNOSIS — I63532 Cerebral infarction due to unspecified occlusion or stenosis of left posterior cerebral artery: Secondary | ICD-10-CM | POA: Diagnosis not present

## 2022-02-03 DIAGNOSIS — Y92239 Unspecified place in hospital as the place of occurrence of the external cause: Secondary | ICD-10-CM | POA: Diagnosis not present

## 2022-02-03 DIAGNOSIS — J189 Pneumonia, unspecified organism: Secondary | ICD-10-CM | POA: Diagnosis present

## 2022-02-03 DIAGNOSIS — Z7189 Other specified counseling: Secondary | ICD-10-CM | POA: Diagnosis not present

## 2022-02-03 DIAGNOSIS — J9611 Chronic respiratory failure with hypoxia: Secondary | ICD-10-CM | POA: Diagnosis not present

## 2022-02-03 DIAGNOSIS — R7881 Bacteremia: Secondary | ICD-10-CM | POA: Diagnosis not present

## 2022-02-03 DIAGNOSIS — A4159 Other Gram-negative sepsis: Secondary | ICD-10-CM | POA: Diagnosis not present

## 2022-02-03 DIAGNOSIS — N179 Acute kidney failure, unspecified: Secondary | ICD-10-CM | POA: Diagnosis not present

## 2022-02-03 DIAGNOSIS — D6959 Other secondary thrombocytopenia: Secondary | ICD-10-CM | POA: Diagnosis not present

## 2022-02-03 DIAGNOSIS — I129 Hypertensive chronic kidney disease with stage 1 through stage 4 chronic kidney disease, or unspecified chronic kidney disease: Secondary | ICD-10-CM | POA: Diagnosis not present

## 2022-02-03 DIAGNOSIS — I2699 Other pulmonary embolism without acute cor pulmonale: Secondary | ICD-10-CM | POA: Diagnosis not present

## 2022-02-03 DIAGNOSIS — R531 Weakness: Secondary | ICD-10-CM | POA: Diagnosis not present

## 2022-02-03 DIAGNOSIS — Z66 Do not resuscitate: Secondary | ICD-10-CM | POA: Diagnosis not present

## 2022-02-03 DIAGNOSIS — Y93E1 Activity, personal bathing and showering: Secondary | ICD-10-CM | POA: Diagnosis not present

## 2022-02-03 DIAGNOSIS — E119 Type 2 diabetes mellitus without complications: Secondary | ICD-10-CM | POA: Diagnosis not present

## 2022-02-03 DIAGNOSIS — W19XXXA Unspecified fall, initial encounter: Secondary | ICD-10-CM | POA: Diagnosis not present

## 2022-02-03 LAB — CBC WITH DIFFERENTIAL (CANCER CENTER ONLY)
Abs Immature Granulocytes: 0.02 10*3/uL (ref 0.00–0.07)
Basophils Absolute: 0.1 10*3/uL (ref 0.0–0.1)
Basophils Relative: 1 %
Eosinophils Absolute: 0.2 10*3/uL (ref 0.0–0.5)
Eosinophils Relative: 2 %
HCT: 44.5 % (ref 39.0–52.0)
Hemoglobin: 15.3 g/dL (ref 13.0–17.0)
Immature Granulocytes: 0 %
Lymphocytes Relative: 9 %
Lymphs Abs: 0.8 10*3/uL (ref 0.7–4.0)
MCH: 32.1 pg (ref 26.0–34.0)
MCHC: 34.4 g/dL (ref 30.0–36.0)
MCV: 93.5 fL (ref 80.0–100.0)
Monocytes Absolute: 0.5 10*3/uL (ref 0.1–1.0)
Monocytes Relative: 6 %
Neutro Abs: 7.4 10*3/uL (ref 1.7–7.7)
Neutrophils Relative %: 82 %
Platelet Count: 290 10*3/uL (ref 150–400)
RBC: 4.76 MIL/uL (ref 4.22–5.81)
RDW: 16.7 % — ABNORMAL HIGH (ref 11.5–15.5)
WBC Count: 9.1 10*3/uL (ref 4.0–10.5)
nRBC: 0 % (ref 0.0–0.2)

## 2022-02-03 LAB — CMP (CANCER CENTER ONLY)
ALT: 27 U/L (ref 0–44)
AST: 28 U/L (ref 15–41)
Albumin: 3.3 g/dL — ABNORMAL LOW (ref 3.5–5.0)
Alkaline Phosphatase: 127 U/L — ABNORMAL HIGH (ref 38–126)
Anion gap: 11 (ref 5–15)
BUN: 18 mg/dL (ref 8–23)
CO2: 25 mmol/L (ref 22–32)
Calcium: 9.6 mg/dL (ref 8.9–10.3)
Chloride: 107 mmol/L (ref 98–111)
Creatinine: 1.68 mg/dL — ABNORMAL HIGH (ref 0.61–1.24)
GFR, Estimated: 42 mL/min — ABNORMAL LOW (ref >60)
Glucose, Bld: 115 mg/dL — ABNORMAL HIGH (ref 70–99)
Potassium: 4.2 mmol/L (ref 3.5–5.1)
Sodium: 143 mmol/L (ref 135–145)
Total Bilirubin: 0.6 mg/dL (ref 0.3–1.2)
Total Protein: 7.1 g/dL (ref 6.5–8.1)

## 2022-02-03 MED ORDER — SODIUM CHLORIDE 0.9 % IV SOLN
200.0000 mg | Freq: Once | INTRAVENOUS | Status: AC
  Administered 2022-02-03: 200 mg via INTRAVENOUS
  Filled 2022-02-03: qty 200

## 2022-02-03 MED ORDER — SODIUM CHLORIDE 0.9 % IV SOLN
400.0000 mg/m2 | Freq: Once | INTRAVENOUS | Status: AC
  Administered 2022-02-03: 800 mg via INTRAVENOUS
  Filled 2022-02-03: qty 20

## 2022-02-03 MED ORDER — PALONOSETRON HCL INJECTION 0.25 MG/5ML
0.2500 mg | Freq: Once | INTRAVENOUS | Status: AC
  Administered 2022-02-03: 0.25 mg via INTRAVENOUS

## 2022-02-03 MED ORDER — SODIUM CHLORIDE 0.9 % IV SOLN
10.0000 mg | Freq: Once | INTRAVENOUS | Status: AC
  Administered 2022-02-03: 10 mg via INTRAVENOUS
  Filled 2022-02-03: qty 1

## 2022-02-03 MED ORDER — SODIUM CHLORIDE 0.9 % IV SOLN: Freq: Once | INTRAVENOUS | Status: AC

## 2022-02-03 MED ORDER — SODIUM CHLORIDE 0.9 % IV SOLN
150.0000 mg | Freq: Once | INTRAVENOUS | Status: AC
  Administered 2022-02-03: 150 mg via INTRAVENOUS
  Filled 2022-02-03: qty 5

## 2022-02-03 MED ORDER — SODIUM CHLORIDE 0.9 % IV SOLN
320.0000 mg | Freq: Once | INTRAVENOUS | Status: AC
  Administered 2022-02-03: 320 mg via INTRAVENOUS
  Filled 2022-02-03: qty 32

## 2022-02-03 MED ORDER — SODIUM CHLORIDE 0.9 % IV SOLN: 500.0000 mg/m2 | Freq: Once | INTRAVENOUS | Status: DC

## 2022-02-03 NOTE — Progress Notes (Signed)
Per Dr. Julien Nordmann, [facility] to use labs from 01/28/22

## 2022-02-03 NOTE — Progress Notes (Addendum)
Spoke w/ Dr. Julien Nordmann and will adjust Carboplatin dose based on serum creatinine changes. He would also like [place] to adjust alimta to 400 mg/m2 for renal insufficiency.    Larene Beach, PharmD

## 2022-02-03 NOTE — Patient Instructions (Signed)
[facility]  Discharge Instructions: Thank you for choosing [facility] to provide your oncology and hematology care.   If you have a lab appointment with the [facility], please go directly to the [facility] and check in at the registration area.   Wear comfortable clothing and clothing appropriate for easy access to any Portacath or PICC line.   We strive to give you quality time with your provider. You may need to reschedule your appointment if you arrive late (15 or more minutes).  Arriving late affects you and other patients whose appointments are after yours.  Also, if you miss three or more appointments without notifying the office, you may be dismissed from the clinic at the provider's discretion.      For prescription refill requests, have your pharmacy contact our office and allow 72 hours for refills to be completed.    Today you received the following chemotherapy and/or immunotherapy agents keytruda, alimta, carboplatin      To help prevent nausea and vomiting after your treatment, we encourage you to take your nausea medication as directed.  BELOW ARE SYMPTOMS THAT SHOULD BE REPORTED IMMEDIATELY: *FEVER GREATER THAN 100.4 F (38 C) OR HIGHER *CHILLS OR SWEATING *NAUSEA AND VOMITING THAT IS NOT CONTROLLED WITH YOUR NAUSEA MEDICATION *UNUSUAL SHORTNESS OF BREATH *UNUSUAL BRUISING OR BLEEDING *URINARY PROBLEMS (pain or burning when urinating, or frequent urination) *BOWEL PROBLEMS (unusual diarrhea, constipation, pain near the anus) TENDERNESS IN MOUTH AND THROAT WITH OR WITHOUT PRESENCE OF ULCERS (sore throat, sores in mouth, or a toothache) UNUSUAL RASH, SWELLING OR PAIN  UNUSUAL VAGINAL DISCHARGE OR ITCHING   Items with * indicate a potential emergency and should be followed up as soon as possible or go to the Emergency Department if any problems should occur.  Please show the CHEMOTHERAPY ALERT CARD or IMMUNOTHERAPY ALERT  CARD at check-in to the Emergency Department and triage nurse.  Should you have questions after your visit or need to cancel or reschedule your appointment, please contact [facility]  Dept: [phone]  and follow the prompts.  Office hours are 8:00 a.m. to 4:30 p.m. Monday - Friday. Please note that voicemails left after 4:00 p.m. may not be returned until the following business day.  We are closed weekends and major holidays. You have access to a nurse at all times for urgent questions. Please call the main number to the clinic Dept: [phone] and follow the prompts.   For any non-urgent questions, you may also contact your provider using MyChart. We now offer e-Visits for anyone 3 and older to request care online for non-urgent symptoms. For details visit [email or web address].   Also download the MyChart app! Go to the app store, search "MyChart", open the app, select [facility], and log in with your MyChart username and password.  Masks are optional in the cancer centers. If you would like for your care team to wear a mask while they are taking care of you, please let them know. You may have one support person who is at least 77 years old accompany you for your appointments.

## 2022-02-04 ENCOUNTER — Ambulatory Visit
Admission: RE | Admit: 2022-02-04 | Discharge: 2022-02-04 | Disposition: A | Discharge status: Home / Self Care | Payer: Medicare Other | Source: Ambulatory Visit | Attending: Radiation Oncology | Admitting: Radiation Oncology

## 2022-02-04 ENCOUNTER — Ambulatory Visit: Payer: Medicare Other

## 2022-02-04 ENCOUNTER — Ambulatory Visit: Payer: Medicare Other | Admitting: Radiation Oncology

## 2022-02-04 ENCOUNTER — Telehealth: Payer: Self-pay | Admitting: Medical Oncology

## 2022-02-04 ENCOUNTER — Other Ambulatory Visit: Payer: Self-pay

## 2022-02-04 ENCOUNTER — Telehealth: Payer: Self-pay | Admitting: Oncology

## 2022-02-04 ENCOUNTER — Encounter (HOSPITAL_COMMUNITY): Payer: Self-pay | Admitting: Family Medicine

## 2022-02-04 ENCOUNTER — Emergency Department (HOSPITAL_COMMUNITY): Payer: Medicare Other

## 2022-02-04 ENCOUNTER — Inpatient Hospital Stay (HOSPITAL_COMMUNITY)
Admission: EM | Admit: 2022-02-04 | Discharge: 2022-02-19 | DRG: 853 | Disposition: A | Discharge status: Hospice | Payer: Medicare Other | Attending: Family Medicine | Admitting: Family Medicine

## 2022-02-04 ENCOUNTER — Inpatient Hospital Stay: Payer: Medicare Other

## 2022-02-04 DIAGNOSIS — I2699 Other pulmonary embolism without acute cor pulmonale: Secondary | ICD-10-CM | POA: Diagnosis not present

## 2022-02-04 DIAGNOSIS — R197 Diarrhea, unspecified: Secondary | ICD-10-CM | POA: Diagnosis not present

## 2022-02-04 DIAGNOSIS — D709 Neutropenia, unspecified: Secondary | ICD-10-CM | POA: Diagnosis not present

## 2022-02-04 DIAGNOSIS — W182XXA Fall in (into) shower or empty bathtub, initial encounter: Secondary | ICD-10-CM | POA: Diagnosis present

## 2022-02-04 DIAGNOSIS — G9341 Metabolic encephalopathy: Secondary | ICD-10-CM | POA: Diagnosis present

## 2022-02-04 DIAGNOSIS — J90 Pleural effusion, not elsewhere classified: Secondary | ICD-10-CM

## 2022-02-04 DIAGNOSIS — C7971 Secondary malignant neoplasm of right adrenal gland: Secondary | ICD-10-CM | POA: Diagnosis present

## 2022-02-04 DIAGNOSIS — N179 Acute kidney failure, unspecified: Secondary | ICD-10-CM | POA: Diagnosis present

## 2022-02-04 DIAGNOSIS — Z8249 Family history of ischemic heart disease and other diseases of the circulatory system: Secondary | ICD-10-CM

## 2022-02-04 DIAGNOSIS — A419 Sepsis, unspecified organism: Secondary | ICD-10-CM | POA: Diagnosis not present

## 2022-02-04 DIAGNOSIS — T82838A Hemorrhage of vascular prosthetic devices, implants and grafts, initial encounter: Secondary | ICD-10-CM | POA: Diagnosis not present

## 2022-02-04 DIAGNOSIS — I129 Hypertensive chronic kidney disease with stage 1 through stage 4 chronic kidney disease, or unspecified chronic kidney disease: Secondary | ICD-10-CM | POA: Diagnosis present

## 2022-02-04 DIAGNOSIS — Z86718 Personal history of other venous thrombosis and embolism: Secondary | ICD-10-CM

## 2022-02-04 DIAGNOSIS — Y93E1 Activity, personal bathing and showering: Secondary | ICD-10-CM

## 2022-02-04 DIAGNOSIS — C786 Secondary malignant neoplasm of retroperitoneum and peritoneum: Secondary | ICD-10-CM | POA: Diagnosis present

## 2022-02-04 DIAGNOSIS — A4159 Other Gram-negative sepsis: Secondary | ICD-10-CM | POA: Diagnosis present

## 2022-02-04 DIAGNOSIS — J939 Pneumothorax, unspecified: Secondary | ICD-10-CM | POA: Diagnosis not present

## 2022-02-04 DIAGNOSIS — W19XXXA Unspecified fall, initial encounter: Secondary | ICD-10-CM | POA: Diagnosis not present

## 2022-02-04 DIAGNOSIS — D701 Agranulocytosis secondary to cancer chemotherapy: Secondary | ICD-10-CM | POA: Diagnosis present

## 2022-02-04 DIAGNOSIS — N189 Chronic kidney disease, unspecified: Secondary | ICD-10-CM | POA: Diagnosis not present

## 2022-02-04 DIAGNOSIS — D6959 Other secondary thrombocytopenia: Secondary | ICD-10-CM | POA: Diagnosis not present

## 2022-02-04 DIAGNOSIS — M6281 Muscle weakness (generalized): Secondary | ICD-10-CM | POA: Diagnosis not present

## 2022-02-04 DIAGNOSIS — D61818 Other pancytopenia: Secondary | ICD-10-CM

## 2022-02-04 DIAGNOSIS — Y92239 Unspecified place in hospital as the place of occurrence of the external cause: Secondary | ICD-10-CM | POA: Diagnosis not present

## 2022-02-04 DIAGNOSIS — T451X5A Adverse effect of antineoplastic and immunosuppressive drugs, initial encounter: Secondary | ICD-10-CM | POA: Diagnosis present

## 2022-02-04 DIAGNOSIS — D62 Acute posthemorrhagic anemia: Secondary | ICD-10-CM | POA: Diagnosis not present

## 2022-02-04 DIAGNOSIS — R6520 Severe sepsis without septic shock: Secondary | ICD-10-CM | POA: Diagnosis not present

## 2022-02-04 DIAGNOSIS — G9340 Encephalopathy, unspecified: Secondary | ICD-10-CM | POA: Diagnosis not present

## 2022-02-04 DIAGNOSIS — I63532 Cerebral infarction due to unspecified occlusion or stenosis of left posterior cerebral artery: Secondary | ICD-10-CM | POA: Diagnosis not present

## 2022-02-04 DIAGNOSIS — Z66 Do not resuscitate: Secondary | ICD-10-CM | POA: Diagnosis present

## 2022-02-04 DIAGNOSIS — R5081 Fever presenting with conditions classified elsewhere: Secondary | ICD-10-CM | POA: Diagnosis present

## 2022-02-04 DIAGNOSIS — F0390 Unspecified dementia without behavioral disturbance: Secondary | ICD-10-CM | POA: Diagnosis present

## 2022-02-04 DIAGNOSIS — E785 Hyperlipidemia, unspecified: Secondary | ICD-10-CM | POA: Diagnosis present

## 2022-02-04 DIAGNOSIS — T45515A Adverse effect of anticoagulants, initial encounter: Secondary | ICD-10-CM | POA: Diagnosis not present

## 2022-02-04 DIAGNOSIS — Z961 Presence of intraocular lens: Secondary | ICD-10-CM | POA: Diagnosis present

## 2022-02-04 DIAGNOSIS — L89152 Pressure ulcer of sacral region, stage 2: Secondary | ICD-10-CM | POA: Diagnosis not present

## 2022-02-04 DIAGNOSIS — C3492 Malignant neoplasm of unspecified part of left bronchus or lung: Secondary | ICD-10-CM

## 2022-02-04 DIAGNOSIS — L8990 Pressure ulcer of unspecified site, unspecified stage: Secondary | ICD-10-CM | POA: Insufficient documentation

## 2022-02-04 DIAGNOSIS — Z8673 Personal history of transient ischemic attack (TIA), and cerebral infarction without residual deficits: Secondary | ICD-10-CM

## 2022-02-04 DIAGNOSIS — R918 Other nonspecific abnormal finding of lung field: Secondary | ICD-10-CM | POA: Diagnosis not present

## 2022-02-04 DIAGNOSIS — Z515 Encounter for palliative care: Secondary | ICD-10-CM | POA: Diagnosis not present

## 2022-02-04 DIAGNOSIS — I10 Essential (primary) hypertension: Secondary | ICD-10-CM | POA: Diagnosis not present

## 2022-02-04 DIAGNOSIS — Z888 Allergy status to other drugs, medicaments and biological substances status: Secondary | ICD-10-CM

## 2022-02-04 DIAGNOSIS — D61810 Antineoplastic chemotherapy induced pancytopenia: Secondary | ICD-10-CM | POA: Diagnosis not present

## 2022-02-04 DIAGNOSIS — N1831 Chronic kidney disease, stage 3a: Secondary | ICD-10-CM | POA: Diagnosis present

## 2022-02-04 DIAGNOSIS — Z7189 Other specified counseling: Secondary | ICD-10-CM | POA: Diagnosis not present

## 2022-02-04 DIAGNOSIS — R000 Tachycardia, unspecified: Secondary | ICD-10-CM | POA: Diagnosis not present

## 2022-02-04 DIAGNOSIS — I82409 Acute embolism and thrombosis of unspecified deep veins of unspecified lower extremity: Secondary | ICD-10-CM | POA: Diagnosis not present

## 2022-02-04 DIAGNOSIS — D6832 Hemorrhagic disorder due to extrinsic circulating anticoagulants: Secondary | ICD-10-CM | POA: Diagnosis not present

## 2022-02-04 DIAGNOSIS — E876 Hypokalemia: Secondary | ICD-10-CM

## 2022-02-04 DIAGNOSIS — Z86711 Personal history of pulmonary embolism: Secondary | ICD-10-CM | POA: Diagnosis not present

## 2022-02-04 DIAGNOSIS — E119 Type 2 diabetes mellitus without complications: Secondary | ICD-10-CM | POA: Diagnosis not present

## 2022-02-04 DIAGNOSIS — C7951 Secondary malignant neoplasm of bone: Secondary | ICD-10-CM | POA: Diagnosis present

## 2022-02-04 DIAGNOSIS — Z801 Family history of malignant neoplasm of trachea, bronchus and lung: Secondary | ICD-10-CM

## 2022-02-04 DIAGNOSIS — Z9842 Cataract extraction status, left eye: Secondary | ICD-10-CM

## 2022-02-04 DIAGNOSIS — I82C11 Acute embolism and thrombosis of right internal jugular vein: Secondary | ICD-10-CM | POA: Diagnosis not present

## 2022-02-04 DIAGNOSIS — C7972 Secondary malignant neoplasm of left adrenal gland: Secondary | ICD-10-CM | POA: Diagnosis present

## 2022-02-04 DIAGNOSIS — Z743 Need for continuous supervision: Secondary | ICD-10-CM | POA: Diagnosis not present

## 2022-02-04 DIAGNOSIS — I6381 Other cerebral infarction due to occlusion or stenosis of small artery: Secondary | ICD-10-CM | POA: Diagnosis not present

## 2022-02-04 DIAGNOSIS — Z8551 Personal history of malignant neoplasm of bladder: Secondary | ICD-10-CM

## 2022-02-04 DIAGNOSIS — Z452 Encounter for adjustment and management of vascular access device: Secondary | ICD-10-CM | POA: Diagnosis not present

## 2022-02-04 DIAGNOSIS — I499 Cardiac arrhythmia, unspecified: Secondary | ICD-10-CM | POA: Diagnosis not present

## 2022-02-04 DIAGNOSIS — J189 Pneumonia, unspecified organism: Secondary | ICD-10-CM | POA: Diagnosis not present

## 2022-02-04 DIAGNOSIS — E1122 Type 2 diabetes mellitus with diabetic chronic kidney disease: Secondary | ICD-10-CM | POA: Diagnosis present

## 2022-02-04 DIAGNOSIS — J9611 Chronic respiratory failure with hypoxia: Secondary | ICD-10-CM | POA: Diagnosis present

## 2022-02-04 DIAGNOSIS — Z79899 Other long term (current) drug therapy: Secondary | ICD-10-CM

## 2022-02-04 DIAGNOSIS — Z9841 Cataract extraction status, right eye: Secondary | ICD-10-CM

## 2022-02-04 DIAGNOSIS — R7881 Bacteremia: Secondary | ICD-10-CM | POA: Diagnosis not present

## 2022-02-04 DIAGNOSIS — Z7901 Long term (current) use of anticoagulants: Secondary | ICD-10-CM

## 2022-02-04 DIAGNOSIS — R531 Weakness: Secondary | ICD-10-CM | POA: Diagnosis not present

## 2022-02-04 DIAGNOSIS — R55 Syncope and collapse: Secondary | ICD-10-CM | POA: Diagnosis not present

## 2022-02-04 DIAGNOSIS — Z7984 Long term (current) use of oral hypoglycemic drugs: Secondary | ICD-10-CM

## 2022-02-04 DIAGNOSIS — F17220 Nicotine dependence, chewing tobacco, uncomplicated: Secondary | ICD-10-CM | POA: Diagnosis present

## 2022-02-04 LAB — CBG MONITORING, ED: Glucose-Capillary: 114 mg/dL — ABNORMAL HIGH (ref 70–99)

## 2022-02-04 LAB — LIPASE, BLOOD: Lipase: 25 U/L (ref 11–51)

## 2022-02-04 LAB — CBC WITH DIFFERENTIAL/PLATELET
Abs Immature Granulocytes: 0.02 10*3/uL (ref 0.00–0.07)
Basophils Absolute: 0.1 10*3/uL (ref 0.0–0.1)
Basophils Relative: 1 %
Eosinophils Absolute: 0.2 10*3/uL (ref 0.0–0.5)
Eosinophils Relative: 2 %
HCT: 49 % (ref 39.0–52.0)
Hemoglobin: 15.5 g/dL (ref 13.0–17.0)
Immature Granulocytes: 0 %
Lymphocytes Relative: 6 %
Lymphs Abs: 0.6 10*3/uL — ABNORMAL LOW (ref 0.7–4.0)
MCH: 30.8 pg (ref 26.0–34.0)
MCHC: 31.6 g/dL (ref 30.0–36.0)
MCV: 97.2 fL (ref 80.0–100.0)
Monocytes Absolute: 0.6 10*3/uL (ref 0.1–1.0)
Monocytes Relative: 6 %
Neutro Abs: 8.1 10*3/uL — ABNORMAL HIGH (ref 1.7–7.7)
Neutrophils Relative %: 85 %
Platelets: 286 10*3/uL (ref 150–400)
RBC: 5.04 MIL/uL (ref 4.22–5.81)
RDW: 17.2 % — ABNORMAL HIGH (ref 11.5–15.5)
WBC: 9.5 10*3/uL (ref 4.0–10.5)
nRBC: 0 % (ref 0.0–0.2)

## 2022-02-04 LAB — PROTIME-INR
INR: 1.7 — ABNORMAL HIGH (ref 0.8–1.2)
Prothrombin Time: 19.5 seconds — ABNORMAL HIGH (ref 11.4–15.2)

## 2022-02-04 LAB — COMPREHENSIVE METABOLIC PANEL
ALT: 32 U/L (ref 0–44)
AST: 41 U/L (ref 15–41)
Albumin: 3 g/dL — ABNORMAL LOW (ref 3.5–5.0)
Alkaline Phosphatase: 122 U/L (ref 38–126)
Anion gap: 11 (ref 5–15)
BUN: 28 mg/dL — ABNORMAL HIGH (ref 8–23)
CO2: 24 mmol/L (ref 22–32)
Calcium: 9 mg/dL (ref 8.9–10.3)
Chloride: 110 mmol/L (ref 98–111)
Creatinine, Ser: 1.96 mg/dL — ABNORMAL HIGH (ref 0.61–1.24)
GFR, Estimated: 35 mL/min — ABNORMAL LOW (ref >60)
Glucose, Bld: 130 mg/dL — ABNORMAL HIGH (ref 70–99)
Potassium: 4.4 mmol/L (ref 3.5–5.1)
Sodium: 145 mmol/L (ref 135–145)
Total Bilirubin: 0.4 mg/dL (ref 0.3–1.2)
Total Protein: 6.8 g/dL (ref 6.5–8.1)

## 2022-02-04 LAB — LACTIC ACID, PLASMA
Lactic Acid, Venous: 2.8 mmol/L (ref 0.5–1.9)
Lactic Acid, Venous: 1.9 mmol/L (ref 0.5–1.9)

## 2022-02-04 LAB — PHOSPHORUS: Phosphorus: 3.7 mg/dL (ref 2.5–4.6)

## 2022-02-04 LAB — MAGNESIUM: Magnesium: 2.3 mg/dL (ref 1.7–2.4)

## 2022-02-04 LAB — APTT: aPTT: 22 seconds — ABNORMAL LOW (ref 24–36)

## 2022-02-04 LAB — URIC ACID: Uric Acid, Serum: 10.5 mg/dL — ABNORMAL HIGH (ref 3.7–8.6)

## 2022-02-04 MED ORDER — HEPARIN (PORCINE) 25000 UT/250ML-% IV SOLN
1200.0000 [IU]/h | INTRAVENOUS | Status: AC
Start: 2022-02-04 — End: 2022-02-05
  Administered 2022-02-04: 1300 [IU]/h via INTRAVENOUS
  Administered 2022-02-05: 1200 [IU]/h via INTRAVENOUS
  Filled 2022-02-04 (×2): qty 250

## 2022-02-04 MED ORDER — SODIUM CHLORIDE 0.9 % IV SOLN
2.0000 g | INTRAVENOUS | Status: DC
  Administered 2022-02-05: 2 g via INTRAVENOUS
  Filled 2022-02-04: qty 20

## 2022-02-04 MED ORDER — ACETAMINOPHEN 325 MG PO TABS
650.0000 mg | ORAL_TABLET | ORAL | Status: DC | PRN
  Administered 2022-02-04: 650 mg via ORAL
  Filled 2022-02-04: qty 2

## 2022-02-04 MED ORDER — SODIUM CHLORIDE 0.9 % IV SOLN
1.0000 g | Freq: Once | INTRAVENOUS | Status: AC
  Administered 2022-02-04: 1 g via INTRAVENOUS
  Filled 2022-02-04: qty 10

## 2022-02-04 MED ORDER — SODIUM CHLORIDE 0.9 % IV SOLN
500.0000 mg | INTRAVENOUS | Status: DC
  Administered 2022-02-05 – 2022-02-06 (×2): 500 mg via INTRAVENOUS
  Filled 2022-02-04 (×3): qty 5

## 2022-02-04 MED ORDER — ENSURE ENLIVE PO LIQD
237.0000 mL | Freq: Two times a day (BID) | ORAL | Status: DC
  Administered 2022-02-05 – 2022-02-17 (×19): 237 mL via ORAL

## 2022-02-04 MED ORDER — ORAL CARE MOUTH RINSE: 15.0000 mL | OROMUCOSAL | Status: DC | PRN

## 2022-02-04 MED ORDER — SODIUM CHLORIDE 0.9 % IV BOLUS
1000.0000 mL | Freq: Once | INTRAVENOUS | Status: AC
Start: 2022-02-04 — End: 2022-02-04
  Administered 2022-02-04: 1000 mL via INTRAVENOUS

## 2022-02-04 MED ORDER — SODIUM CHLORIDE 0.9 % IV SOLN
500.0000 mg | Freq: Once | INTRAVENOUS | Status: AC
  Administered 2022-02-04: 500 mg via INTRAVENOUS
  Filled 2022-02-04: qty 5

## 2022-02-04 NOTE — Progress Notes (Signed)
Visitor at bedside and visitor in hallway. Visitor at bedside on telephone yelling. I entered room.  Visitor at bedside screamed at [facility] are you doing?" I explained to her that I am the through put nurse and came to do the nursing admission history for the nurses upstairs. She screamed at me "Is he being admitted?" I told her that it looks like an order was put in for a doctor to come and evaluate the patient for possible admission. She screamed for  me to get out of the room. She screamed "Nobody is coming in her until the doctor comes in here." Visitor in hall calmly states that no one has been in since 2 pm. I told the two ladies ok and left. Unable to complete nursing admission history. Doroteo Bradford BSN, RN-BC Throughput Nurse 02/04/2022 6:33 PM

## 2022-02-04 NOTE — Progress Notes (Signed)
Visitor at bedside came and found me in the hallway. She told me that was the patient's wife. She apologized profusely and asked me to come back to her husband's room so that she could answer nursing admission history questions for her husband. She answered them and continued to apologize profusely. I told her that it is fine and not to worry about it. Nursing admission history completed. Doroteo Bradford BSN, RN-BC Throughput Nurse 02/04/2022. Now

## 2022-02-04 NOTE — Assessment & Plan Note (Addendum)
A1c 7.3%, adequately controlled here - Continue SS corrections

## 2022-02-04 NOTE — Assessment & Plan Note (Addendum)
-   Stop Lovenox given thrombocytopenia, plans for discharge with Hospice

## 2022-02-04 NOTE — Assessment & Plan Note (Addendum)
Creatinine 1.96 on admission, now resolved to baseline.

## 2022-02-04 NOTE — Assessment & Plan Note (Addendum)
BP normal - Continue amlodipine, losartan, Metoporlol

## 2022-02-04 NOTE — ED Triage Notes (Signed)
Ems brings pt in from home for generalized weakness. Pt had first chemo treatment yesterday. Today pt was in shower and fell backwards. Family states pt did not hit head, pt states he did hit his head. C-collar in place by ems. Pt taking Lovenox.

## 2022-02-04 NOTE — Telephone Encounter (Signed)
Wife LVM -"Drew Harrington is not is not doing well, very weak.I am calling 911 to take  him to the hospital.  I called back and no answer.  Pt completed chemotherapy yesterday -cycle 1 ,day 1. He reported dysuria but he could not produce a urine  sample so he was re-scheduled for lab today .

## 2022-02-04 NOTE — Assessment & Plan Note (Addendum)
Completed antibiotics, weaned back to baesline O2.

## 2022-02-04 NOTE — Assessment & Plan Note (Addendum)
S/p thoracentesis.

## 2022-02-04 NOTE — Telephone Encounter (Signed)
[name] called and needs to cancel today's appointment with Dr. Sondra Come.  He does not want to come in today.  Advised her we will call her back to reschedule.

## 2022-02-04 NOTE — Telephone Encounter (Signed)
Pt was scheduled to submit a urine sample for testing. Lab notes indicate the pt was not able to provide this sample.

## 2022-02-04 NOTE — ED Provider Notes (Addendum)
Results for orders placed or performed during the hospital encounter of 02/04/22  CBC with Differential  Result Value Ref Range   WBC 9.5 4.0 - 10.5 K/uL   RBC 5.04 4.22 - 5.81 MIL/uL   Hemoglobin 15.5 13.0 - 17.0 g/dL   HCT 49.0 39.0 - 52.0 %   MCV 97.2 80.0 - 100.0 fL   MCH 30.8 26.0 - 34.0 pg   MCHC 31.6 30.0 - 36.0 g/dL   RDW 17.2 (H) 11.5 - 15.5 %   Platelets 286 150 - 400 K/uL   nRBC 0.0 0.0 - 0.2 %   Neutrophils Relative % 85 %   Neutro Abs 8.1 (H) 1.7 - 7.7 K/uL   Lymphocytes Relative 6 %   Lymphs Abs 0.6 (L) 0.7 - 4.0 K/uL   Monocytes Relative 6 %   Monocytes Absolute 0.6 0.1 - 1.0 K/uL   Eosinophils Relative 2 %   Eosinophils Absolute 0.2 0.0 - 0.5 K/uL   Basophils Relative 1 %   Basophils Absolute 0.1 0.0 - 0.1 K/uL   Immature Granulocytes 0 %   Abs Immature Granulocytes 0.02 0.00 - 0.07 K/uL  Protime-INR  Result Value Ref Range   Prothrombin Time 19.5 (H) 11.4 - 15.2 seconds   INR 1.7 (H) 0.8 - 1.2  APTT  Result Value Ref Range   aPTT 22 (L) 24 - 36 seconds  Lactic acid, plasma  Result Value Ref Range   Lactic Acid, Venous 2.8 (HH) 0.5 - 1.9 mmol/L  Comprehensive metabolic panel  Result Value Ref Range   Sodium 145 135 - 145 mmol/L   Potassium 4.4 3.5 - 5.1 mmol/L   Chloride 110 98 - 111 mmol/L   CO2 24 22 - 32 mmol/L   Glucose, Bld 130 (H) 70 - 99 mg/dL   BUN 28 (H) 8 - 23 mg/dL   Creatinine, Ser 1.96 (H) 0.61 - 1.24 mg/dL   Calcium 9.0 8.9 - 10.3 mg/dL   Total Protein 6.8 6.5 - 8.1 g/dL   Albumin 3.0 (L) 3.5 - 5.0 g/dL   AST 41 15 - 41 U/L   ALT 32 0 - 44 U/L   Alkaline Phosphatase 122 38 - 126 U/L   Total Bilirubin 0.4 0.3 - 1.2 mg/dL   GFR, Estimated 35 (L) >60 mL/min   Anion gap 11 5 - 15  Lipase, blood  Result Value Ref Range   Lipase 25 11 - 51 U/L  Magnesium  Result Value Ref Range   Magnesium 2.3 1.7 - 2.4 mg/dL  Phosphorus  Result Value Ref Range   Phosphorus 3.7 2.5 - 4.6 mg/dL  Uric acid  Result Value Ref Range   Uric Acid, Serum  10.5 (H) 3.7 - 8.6 mg/dL  CBG monitoring, ED  Result Value Ref Range   Glucose-Capillary 114 (H) 70 - 99 mg/dL   Comment 1 Notify RN    Comment 2 Document in Chart    CT Head Wo Contrast  Result Date: 02/04/2022 CLINICAL DATA:  Generalized weakness. EXAM: CT HEAD WITHOUT CONTRAST CT CERVICAL SPINE WITHOUT CONTRAST TECHNIQUE: Multidetector CT imaging of the head and cervical spine was performed following the standard protocol without intravenous contrast. Multiplanar CT image reconstructions of the cervical spine were also generated. RADIATION DOSE REDUCTION: This exam was performed according to the departmental dose-optimization program which includes automated exposure control, adjustment of the mA and/or kV according to patient size and/or use of iterative reconstruction technique. COMPARISON:  12/30/2021 CT head, 01/24/2022 MRI head; no  prior cervical spine CT, correlation is made with 04/17/2013 cervical spine radiographs FINDINGS: CT HEAD FINDINGS Evaluation is somewhat limited by motion artifact. Brain: No evidence of acute infarct, hemorrhage, mass, mass effect, or midline shift. No hydrocephalus or extra-axial fluid collection. Remote left occipital infarct. Redemonstrated lacunar infarcts in the left basal ganglia, left corona radiata, and bilateral thalami. Periventricular white matter changes, likely the sequela of chronic small vessel ischemic disease. Vascular: No hyperdense vessel. Skull: Normal. Negative for fracture or focal lesion. Sinuses/Orbits: Partial opacification of the posterior left ethmoid air cells and left sphenoid sinus. Status post bilateral lens replacements. Other: The mastoid air cells are well aerated. CT CERVICAL SPINE FINDINGS Alignment: Straightening of the normal cervical lordosis. No listhesis. Skull base and vertebrae: No acute fracture. No primary bone lesion or focal pathologic process. Soft tissues and spinal canal: No prevertebral fluid or swelling. No visible  canal hematoma. Disc levels: Mild degenerative changes for age. No high-grade spinal canal stenosis. Upper chest: Left apical mass, which extends into the inferior left neck, which is better evaluated on the 01/15/2022 CT chest. Moderate right and large left pleural effusions, new on the right and increased on the left compared to 01/15/2022 Other: Presumed nodal metastasis in the right neck (series 4, image 40) is poorly evaluated in the absence of intravenous contrast. Additional prominent right level 2 lymph nodes (series 4, image 53) and left level 3 lymph node (series 4, image 80). IMPRESSION: 1.  No acute intracranial process. 2.  No acute fracture or traumatic listhesis in the cervical spine. 3. Known left apical mass is incompletely evaluated. Moderate right and large left pleural effusions. 4. Presumed nodal metastases in the bilateral cervical lymph nodes. Electronically Signed   By: Merilyn Baba M.D.   On: 02/04/2022 16:19   CT Cervical Spine Wo Contrast  Result Date: 02/04/2022 CLINICAL DATA:  Generalized weakness. EXAM: CT HEAD WITHOUT CONTRAST CT CERVICAL SPINE WITHOUT CONTRAST TECHNIQUE: Multidetector CT imaging of the head and cervical spine was performed following the standard protocol without intravenous contrast. Multiplanar CT image reconstructions of the cervical spine were also generated. RADIATION DOSE REDUCTION: This exam was performed according to the departmental dose-optimization program which includes automated exposure control, adjustment of the mA and/or kV according to patient size and/or use of iterative reconstruction technique. COMPARISON:  12/30/2021 CT head, 01/24/2022 MRI head; no prior cervical spine CT, correlation is made with 04/17/2013 cervical spine radiographs FINDINGS: CT HEAD FINDINGS Evaluation is somewhat limited by motion artifact. Brain: No evidence of acute infarct, hemorrhage, mass, mass effect, or midline shift. No hydrocephalus or extra-axial fluid  collection. Remote left occipital infarct. Redemonstrated lacunar infarcts in the left basal ganglia, left corona radiata, and bilateral thalami. Periventricular white matter changes, likely the sequela of chronic small vessel ischemic disease. Vascular: No hyperdense vessel. Skull: Normal. Negative for fracture or focal lesion. Sinuses/Orbits: Partial opacification of the posterior left ethmoid air cells and left sphenoid sinus. Status post bilateral lens replacements. Other: The mastoid air cells are well aerated. CT CERVICAL SPINE FINDINGS Alignment: Straightening of the normal cervical lordosis. No listhesis. Skull base and vertebrae: No acute fracture. No primary bone lesion or focal pathologic process. Soft tissues and spinal canal: No prevertebral fluid or swelling. No visible canal hematoma. Disc levels: Mild degenerative changes for age. No high-grade spinal canal stenosis. Upper chest: Left apical mass, which extends into the inferior left neck, which is better evaluated on the 01/15/2022 CT chest. Moderate right and large left pleural  effusions, new on the right and increased on the left compared to 01/15/2022 Other: Presumed nodal metastasis in the right neck (series 4, image 40) is poorly evaluated in the absence of intravenous contrast. Additional prominent right level 2 lymph nodes (series 4, image 53) and left level 3 lymph node (series 4, image 80). IMPRESSION: 1.  No acute intracranial process. 2.  No acute fracture or traumatic listhesis in the cervical spine. 3. Known left apical mass is incompletely evaluated. Moderate right and large left pleural effusions. 4. Presumed nodal metastases in the bilateral cervical lymph nodes. Electronically Signed   By: Merilyn Baba M.D.   On: 02/04/2022 16:19   DG Chest 1 View  Result Date: 02/04/2022 CLINICAL DATA:  Syncope EXAM: CHEST  1 VIEW COMPARISON:  Chest x-ray dated January 22, 2022 FINDINGS: Visualized cardiac and mediastinal contours are within  normal limits. Left suprahilar mass compatible with known malignancy. New large right pleural effusion. Evidence of pneumothorax. IMPRESSION: Left suprahilar mass with new large left pleural effusion. Electronically Signed   By: Yetta Glassman M.D.   On: 02/04/2022 15:42   NM PET Image Initial (PI) Skull Base To Thigh (F-18 FDG)  Result Date: 01/27/2022 CLINICAL DATA:  Initial treatment strategy for non-small cell lung cancer. EXAM: NUCLEAR MEDICINE PET SKULL BASE TO THIGH TECHNIQUE: 7.9 mCi F-18 FDG was injected intravenously. Full-ring PET imaging was performed from the skull base to thigh after the radiotracer. CT data was obtained and used for attenuation correction and anatomic localization. Fasting blood glucose: 126 mg/dl COMPARISON:  Chest CT 01/15/2022 FINDINGS: Mediastinal blood pool activity: SUV max 2.4 Liver activity: SUV max NA NECK: Enlarged hypermetabolic RIGHT level II lymph node with SUV max equal 13.8 on image 24. Node measures 16 mm beneath sternocleidomastoid Hypermetabolic RIGHT supraglottic tissue adjacent to the epiglottis with SUV max equal 12.9 Incidental CT findings: none CHEST: Large LEFT upper lobe mass bordering the mediastinum measuring 10.5 x 6.1 cm. The mass is intensely hypermetabolic peripherally SUV max equal 20.4 Evidence of direct extension into the AP window with 3.8 cm mass with SUV max equal 17.2 Large layering LEFT effusion. Within the layering effusion there is hypermetabolic lesion medially along the pleural surface with SUV max equal 18.4 on image 106 Incidental CT findings: none ABDOMEN/PELVIS: Bilateral hypermetabolic adrenal metastasis. The LEFT adrenal gland is enlarged to 3 cm with SUV max equal 03.5 Two hypermetabolic peritoneal implants. One in the RIGHT abdomen measures 2.2 cm (142) with SUV max equal 14.3. Smaller lesion in the ventral peritoneal space on the LEFT measures 10 mm on image 148 SUV max equal 12.5. No evidence of bowel obstruction. Incidental CT  findings: none SKELETON: Single focus of metabolic activity in the LEFT inferior pubic ramus with SUV max equal 12.4 on image 90. No CT correlation Incidental CT findings: none IMPRESSION: 1. Large LEFT upper lobe mass with intense metabolic activity bordering the mediastinum and transverse arch of the aorta consistent with bronchogenic carcinoma 2. AP window hypermetabolic mass. Pleural metastasis in the LEFT lower lobe. 3. Hypermetabolic RIGHT level II metastatic cervical lymph nodes. 4. Hypermetabolic RIGHT supraglottic tissue. Unusual location for metastatic disease. Cannot exclude a primary lesion. 5. Bilateral hyper enlarged hypermetabolic adrenal metastasis. 6. Two  hypermetabolic peritoneal metastatic implants the abdomen 7. Single hypermetabolic skeletal metastasis to the LEFT inferior pubic ramus. Electronically Signed   By: Suzy Bouchard M.D.   On: 01/27/2022 16:49   MR BRAIN W WO CONTRAST  Result Date: 01/25/2022 CLINICAL DATA:  Non-small cell lung cancer staging EXAM: MRI HEAD WITHOUT AND WITH CONTRAST TECHNIQUE: Multiplanar, multiecho pulse sequences of the brain and surrounding structures were obtained without and with intravenous contrast. CONTRAST:  62mL GADAVIST GADOBUTROL 1 MMOL/ML IV SOLN COMPARISON:  01/16/2022 FINDINGS: Brain: No enhancement or edema to suggest metastatic disease. No recent infarction, hemorrhage, hydrocephalus, extra-axial collection or mass lesion. Advanced chronic small vessel ischemia with ischemic gliosis and chronic small vessel infarcts in the left basal ganglia, bilateral thalamus, and right pons. Small remote left occipital cortex infarct. Chronic microhemorrhages in the deep brain attributed to the same. Vascular: Major flow voids and vascular enhancements are preserved Skull and upper cervical spine: No focal marrow lesion. Sinuses/Orbits: Negative Other: Partially covered mass in the right lateral neck with heterogeneous/necrotic appearance and 3.4 cm size,  likely nodal. IMPRESSION: 1. Heterogeneous mass in the right lateral neck, presumably nodal metastasis. 2. Negative for intracranial metastasis. 3. Advanced chronic small vessel disease. Electronically Signed   By: Jorje Guild M.D.   On: 01/25/2022 14:33   MR BRAIN WO CONTRAST  Result Date: 01/16/2022 CLINICAL DATA:  Non-small cell lung cancer staging EXAM: MRI HEAD WITHOUT CONTRAST TECHNIQUE: Multiplanar, multiecho pulse sequences of the brain and surrounding structures were obtained without intravenous contrast. COMPARISON:  Head CT 12/30/2021 FINDINGS: Brain: No swelling or masslike finding to suggest metastatic disease. Chronic small vessel ischemic gliosis in the cerebral white matter with chronic perforator infarct at the left basal ganglia. Chronic lacunar infarcts in the bilateral thalami with numerous remote micro hemorrhages in the deep brain. Small remote left occipital and right parietal cortex infarcts. Wallerian changes seen in the bilateral middle cerebellar peduncle associated with a chronic right pontine lacune. No acute hemorrhage, acute infarct, hydrocephalus, or collection. Vascular: Major flow voids are preserved Skull and upper cervical spine: Normal marrow signal Sinuses/Orbits: Negative IMPRESSION: 1. Limited for staging purposes due to lack of IV contrast. No gross masslike features. 2. Advanced chronic small vessel ischemia. Electronically Signed   By: Jorje Guild M.D.   On: 01/16/2022 12:07   VAS [place] LOWER EXTREMITY VENOUS (DVT)  Result Date: 01/16/2022  Lower Venous DVT Study Patient Name:  KEEGAN DUCEY  Date of Exam:   01/15/2022 Medical Rec #: [id number]          Accession #:    [id number] Date of Birth: [birth year]           Patient Gender: M Patient Age:   21 years Exam Location:  [facility] Procedure:      VAS [place] LOWER EXTREMITY VENOUS (DVT) Referring Phys: Nevin Bloodgood SIMPSON --------------------------------------------------------------------------------   Indications: Pulmonary embolism.  Risk Factors: Confirmed PE. Anticoagulation: Heparin. Comparison Study: No prior studies. Performing Technologist: Oliver Hum RVT  Examination Guidelines: A complete evaluation includes B-mode imaging, spectral Doppler, color Doppler, and power Doppler as needed of all accessible portions of each vessel. Bilateral testing is considered an integral part of a complete examination. Limited examinations for reoccurring indications may be performed as noted. The reflux portion of the exam is performed with the patient in reverse Trendelenburg.  +---------+---------------+---------+-----------+----------+--------------+ RIGHT    CompressibilityPhasicitySpontaneityPropertiesThrombus Aging +---------+---------------+---------+-----------+----------+--------------+ CFV      Full           Yes      Yes                                 +---------+---------------+---------+-----------+----------+--------------+ SFJ  Full                                                        +---------+---------------+---------+-----------+----------+--------------+ FV Prox  Full                                                        +---------+---------------+---------+-----------+----------+--------------+ FV Mid   Full                                                        +---------+---------------+---------+-----------+----------+--------------+ FV DistalPartial        Yes      No                   Acute          +---------+---------------+---------+-----------+----------+--------------+ PFV      Full                                                        +---------+---------------+---------+-----------+----------+--------------+ POP      None           No       No                   Acute          +---------+---------------+---------+-----------+----------+--------------+ PTV      Full                                                         +---------+---------------+---------+-----------+----------+--------------+ PERO     Partial                                      Acute          +---------+---------------+---------+-----------+----------+--------------+ Soleal   Partial                                      Acute          +---------+---------------+---------+-----------+----------+--------------+   +---------+---------------+---------+-----------+----------+--------------+ LEFT     CompressibilityPhasicitySpontaneityPropertiesThrombus Aging +---------+---------------+---------+-----------+----------+--------------+ CFV      Full           Yes      Yes                                 +---------+---------------+---------+-----------+----------+--------------+ SFJ      Full                                                        +---------+---------------+---------+-----------+----------+--------------+  FV Prox  Full                                                        +---------+---------------+---------+-----------+----------+--------------+ FV Mid   Full                                                        +---------+---------------+---------+-----------+----------+--------------+ FV DistalFull                                                        +---------+---------------+---------+-----------+----------+--------------+ PFV      Full                                                        +---------+---------------+---------+-----------+----------+--------------+ POP      Full           Yes      Yes                                 +---------+---------------+---------+-----------+----------+--------------+ PTV      Full                                                        +---------+---------------+---------+-----------+----------+--------------+ PERO     Full                                                         +---------+---------------+---------+-----------+----------+--------------+     Summary: RIGHT: - Findings consistent with acute deep vein thrombosis involving the right femoral vein, right popliteal vein, right peroneal veins, and right soleal veins. - No cystic structure found in the popliteal fossa.  LEFT: - There is no evidence of deep vein thrombosis in the lower extremity.  - No cystic structure found in the popliteal fossa.  *See table(s) above for measurements and observations. Electronically signed by Orlie Pollen on 01/16/2022 at 12:00:16 PM.    Final    ECHOCARDIOGRAM COMPLETE  Result Date: 01/15/2022    ECHOCARDIOGRAM REPORT   Patient Name:   CALI CUARTAS Date of Exam: 01/15/2022 Medical Rec #:  [id number]         Height:       69.0 in Accession #:    [id number]        Weight:       167.5 lb Date of Birth:  16-Sep-1945          BSA:  1.916 m Patient Age:    69 years          BP:           125/97 mmHg Patient Gender: M                 HR:           93 bpm. Exam Location:  Inpatient Procedure: 2D Echo, Cardiac Doppler and Color Doppler Indications:    Pulmonary embolus  History:        Patient has prior history of Echocardiogram examinations. Risk                 Factors:Hypertension and Diabetes.  Sonographer:    Jyl Heinz Referring Phys: [id number] [place]  1. Cor pulmonale with severe TR and RV dysfucntion in setting of clinical PE.  2. Left ventricular ejection fraction, by estimation, is 55%. The left ventricle has normal function. The left ventricle has no regional wall motion abnormalities. Left ventricular diastolic parameters are consistent with Grade I diastolic dysfunction (impaired relaxation).  3. Right ventricular systolic function is severely reduced. The right ventricular size is normal.  4. The mitral valve is abnormal. Trivial mitral valve regurgitation. No evidence of mitral stenosis.  5. Tricuspid valve regurgitation is severe.  6. The aortic valve is  tricuspid. There is mild calcification of the aortic valve. There is mild thickening of the aortic valve. Aortic valve regurgitation is not visualized. Aortic valve sclerosis is present, with no evidence of aortic valve stenosis.  7. The inferior vena cava is dilated in size with >50% respiratory variability, suggesting right atrial pressure of 8 mmHg. FINDINGS  Left Ventricle: Left ventricular ejection fraction, by estimation, is 55%. The left ventricle has normal function. The left ventricle has no regional wall motion abnormalities. The left ventricular internal cavity size was normal in size. There is no left ventricular hypertrophy. Left ventricular diastolic parameters are consistent with Grade I diastolic dysfunction (impaired relaxation). Right Ventricle: The right ventricular size is normal. No increase in right ventricular wall thickness. Right ventricular systolic function is severely reduced. Left Atrium: Left atrial size was normal in size. Right Atrium: Right atrial size was normal in size. Pericardium: There is no evidence of pericardial effusion. Mitral Valve: The mitral valve is abnormal. There is mild thickening of the mitral valve leaflet(s). There is mild calcification of the mitral valve leaflet(s). Trivial mitral valve regurgitation. No evidence of mitral valve stenosis. Tricuspid Valve: The tricuspid valve is normal in structure. Tricuspid valve regurgitation is severe. No evidence of tricuspid stenosis. Aortic Valve: The aortic valve is tricuspid. There is mild calcification of the aortic valve. There is mild thickening of the aortic valve. Aortic valve regurgitation is not visualized. Aortic valve sclerosis is present, with no evidence of aortic valve stenosis. Aortic valve peak gradient measures 3.3 mmHg. Pulmonic Valve: The pulmonic valve was normal in structure. Pulmonic valve regurgitation is trivial. No evidence of pulmonic stenosis. Aorta: The aortic root is normal in size and  structure. Venous: The inferior vena cava is dilated in size with greater than 50% respiratory variability, suggesting right atrial pressure of 8 mmHg. IAS/Shunts: No atrial level shunt detected by color flow Doppler. 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  LEFT VENTRICLE PLAX 2D LVIDd:         3.60 cm     Diastology LVIDs:         2.30 cm  LV e' medial:    5.33 cm/s LV PW:         1.30 cm     LV E/e' medial:  14.5 LV IVS:        1.40 cm     LV e' lateral:   5.98 cm/s LVOT diam:     2.00 cm     LV E/e' lateral: 12.9 LV SV:         40 LV SV Index:   21 LVOT Area:     3.14 cm  LV Volumes (MOD) LV vol d, MOD A2C: 76.4 ml LV vol d, MOD A4C: 72.7 ml LV vol s, MOD A2C: 28.5 ml LV vol s, MOD A4C: 30.2 ml LV SV MOD A2C:     47.9 ml LV SV MOD A4C:     72.7 ml LV SV MOD BP:      43.1 ml RIGHT VENTRICLE            IVC RV Basal diam:  3.70 cm    IVC diam: 1.90 cm RV Mid diam:    2.70 cm RV S prime:     8.27 cm/s TAPSE (M-mode): 1.8 cm LEFT ATRIUM           Index        RIGHT ATRIUM           Index LA diam:      2.70 cm 1.41 cm/m   RA Area:     18.20 cm LA Vol (A2C): 26.0 ml 13.57 ml/m  RA Volume:   54.90 ml  28.65 ml/m LA Vol (A4C): 38.7 ml 20.20 ml/m  AORTIC VALVE                 PULMONIC VALVE AV Area (Vmax): 2.87 cm     PR End Diast Vel: 1.41 msec AV Vmax:        91.50 cm/s AV Peak Grad:   3.3 mmHg LVOT Vmax:      83.60 cm/s LVOT Vmean:     66.400 cm/s LVOT VTI:       0.126 m  AORTA Ao Root diam: 3.60 cm Ao Asc diam:  3.70 cm MITRAL VALVE               TRICUSPID VALVE MV Area (PHT): 10.54 cm   TR Peak grad:   41.7 mmHg MV Decel Time: 72 msec     TR Vmax:        323.00 cm/s MV E velocity: 77.10 cm/s MV A velocity: 76.30 cm/s  SHUNTS MV E/A ratio:  1.01        Systemic VTI:  0.13 m                            Systemic Diam: 2.00 cm Jenkins Rouge MD Electronically signed by Jenkins Rouge MD Signature Date/Time: 01/15/2022/2:01:41 PM    Final    CT Angio Chest PE  W and/or Wo Contrast  Result Date: 01/15/2022 CLINICAL DATA:  Pulmonary embolus suspected with high probability. Productive cough, low oxygen saturation, recent diagnosis of lung cancer. EXAM: CT ANGIOGRAPHY CHEST WITH CONTRAST TECHNIQUE: Multidetector CT imaging of the chest was performed using the standard protocol during bolus administration of intravenous contrast. Multiplanar CT image reconstructions and MIPs were obtained to evaluate the vascular anatomy. RADIATION DOSE REDUCTION: This exam was performed according to the departmental dose-optimization program which includes automated exposure control, adjustment of the mA and/or  kV according to patient size and/or use of iterative reconstruction technique. CONTRAST:  40mL OMNIPAQUE IOHEXOL 350 MG/ML SOLN COMPARISON:  12/30/2021 FINDINGS: Cardiovascular: Large filling defects are demonstrated in the right main pulmonary artery and extending into upper and lower lobe branches consistent with acute pulmonary embolus. Finding is new since prior study. Cardiac enlargement. No pericardial effusion. Elevated RV to LV ratio at 1.5. Normal caliber thoracic aorta. There is eccentric thrombus in the aortic arch and descending aorta, similar to prior study. Mediastinum/Nodes: Esophagus is decompressed. Lymphadenopathy in the left hilum and left paratracheal region, similar to prior study. Thyroid gland is unremarkable. Lungs/Pleura: Large left apical mass extending to the left hilum and surrounding the left subclavian artery with partially surrounding the left carotid artery origin. Mass measures up to about 7.2 x 8 cm in diameter. This is likely to represent primary lung cancer. There is atelectasis or consolidation in the left upper lung and left lower lung. Moderate left pleural effusion. Patchy peripheral wedge-shaped opacities are demonstrated in the right upper lung, new since prior study, possibly peripheral infarcts. Upper Abdomen: Large left adrenal gland  nodule measuring 3.8 cm diameter, unchanged since prior study and likely metastatic. Circumscribed low-attenuation lesion centrally in the liver measuring 3.6 cm diameter is unchanged since prior study, likely representing a benign cyst. Musculoskeletal: No chest wall abnormality. No acute or significant osseous findings. Review of the MIP images confirms the above findings. IMPRESSION: 1. Positive examination for pulmonary embolus with large right main and multiple peripheral pulmonary emboli. Elevated RV to LV ratio at 1.5 suggesting right heart strain. Positive for acute PE with CT evidence of right heart strain (RV/LV Ratio = 1.5) consistent with at least submassive (intermediate risk) PE. The presence of right heart strain has been associated with an increased risk of morbidity and mortality. Please refer to the "Code PE Focused" order set in EPIC. 2. Again demonstrated is a large left apical/hilar/mediastinal mass lesion likely representing primary lung cancer with lymphadenopathy and direct mediastinal invasion. 3.8 cm left adrenal gland nodule most likely represents a metastasis. 3. Moderate left pleural effusion with atelectasis or infiltration in the left lung. 4. Probable small peripheral infarcts in the right lung. Critical Value/emergent results were called by telephone at the time of interpretation on 01/15/2022 at 1:25 am to provider Tegler, who verbally acknowledged these results. Electronically Signed   By: Lucienne Capers M.D.   On: 01/15/2022 01:37   DG Chest Port 1 View  Result Date: 01/15/2022 CLINICAL DATA:  Shortness of breath, cough with bloody sputum. Recent diagnosis of lung cancer with biopsy. EXAM: PORTABLE CHEST 1 VIEW COMPARISON:  12/25/2021. FINDINGS: The heart size and mediastinal contours are stable. A consolidative opacity is noted in the left upper lobe at the paramediastinal border and is unchanged from the prior exam. Mild atelectasis is present at the left lung base. The  right lung is clear. No effusion or pneumothorax. No acute osseous abnormality. IMPRESSION: 1. Stable mass in the left upper lobe along the paramediastinal border. 2. Mild atelectasis at the left lung base. Electronically Signed   By: Brett Fairy M.D.   On: 01/15/2022 00:12    Patient is 76 year old who has a history of recently diagnosed lung cancer, started chemo yesterday, presents with shortness of breath and tachycardia.  He has evidence of a large left pleural effusion with a mediastinal mass.  He was noted to have a rectal temp of 102.8.  His lactate is elevated.  Sepsis protocol started.  Blood cultures were added.  He was started on IV antibiotics.  He has been given 1 L of IV fluids.  He does have a new oxygen requirement at this point and is requiring nasal cannula oxygen.  Have consulted the hospitalist for admission.  CRITICAL CARE Performed by: Malvin Johns Total critical care time: 45 minutes Critical care time was exclusive of separately billable procedures and treating other patients. Critical care was necessary to treat or prevent imminent or life-threatening deterioration. Critical care was time spent personally by me on the following activities: development of treatment plan with patient and/or surrogate as well as nursing, discussions with consultants, evaluation of patient's response to treatment, examination of patient, obtaining history from patient or surrogate, ordering and performing treatments and interventions, ordering and review of laboratory studies, ordering and review of radiographic studies, pulse oximetry and re-evaluation of patient's condition.    Malvin Johns, MD 02/04/22 1188    Malvin Johns, MD 02/04/22 [id number]

## 2022-02-04 NOTE — Assessment & Plan Note (Addendum)
At baseline has mild cognitive impairment, but oriented to self, situation, date, place.  On arrival was confused more than baseline.  This has waxed and waned, as the patient recovers from his sepsis, and may be entering a terminal decline.

## 2022-02-04 NOTE — Assessment & Plan Note (Addendum)
Discussed with wife by phone.  Patient has expressed to her that he does not wish to do more chemo.  Given his limited functional improvement in the last week and his declining alertness and oral intake, her intuition seems correct that he is entering a terminal decline.   - Consult Hospice - Stop chemo, stop Granix

## 2022-02-04 NOTE — H&P (Signed)
History and Physical    Patient: Drew Harrington HEN:[id number] DOB: 08/06/1945 DOA: 02/04/2022 DOS: the patient was seen and examined on 02/04/2022 PCP: Hoyt Koch, MD  Patient coming from: Home  Chief Complaint:  Chief Complaint  Patient presents with   Weakness   HPI: Drew Harrington is a 76 y.o. male with medical history significant of stage IVB non-small cell lung cancer, adenocarcinoma with large apical LUL mass, with mediastinal, right cervical and bilateral adrenal metastases, PE involving large right main on Lovenox (dx 12/2021), HTN, dementia, CKD 3a who presents with diarrhea  and weakness.  History provided by wife over the phone. Pt just had first session of chemotherapy yesterday and started to have diarrhea and  confusion. Today slipped in the bathtub while trying to wash up. Wife then decided to bring him to ED.   In the ED, he was febrile up to 102.19F, tachycardic and tachypneic, normotensive but stable on room air.  No leukocytosis or anemia.  Lactate of 2.8. Creatinine elevated 1.96 up from 1.68 with baseline of around 1  CT head negative.  CT cervical spine without any acute fractures but showed moderate right and large left pleural effusion.  Left being increased and right effusion being new compared with prior imaging. Review of Systems: unable to review all systems due to the inability of the patient to answer questions. 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[place])    Borderline diabetes    Cataract    bilateral lens implants   Diabetes mellitus without complication (HCC)    HTN (hypertension)    Hyperlipidemia    Insomnia, unspecified    Perirectal abscess 05/10/2013   Personal history of colonic adenomas 04/10/2010   Psychosexual dysfunction with inhibited sexual excitement    Stroke ([place])    Subacute intracranial hemorrhage (HCC)    Unspecified hemorrhoids without mention of complication    Past  Surgical History:  Procedure Laterality Date   BRONCHIAL NEEDLE ASPIRATION BIOPSY  01/13/2022   Procedure: BRONCHIAL NEEDLE ASPIRATION BIOPSIES;  Surgeon: Garner Nash, DO;  Location: [facility];  Service: Pulmonary;;   CATARACT EXTRACTION W/ INTRAOCULAR LENS  IMPLANT, BILATERAL     COLONOSCOPY     removal cyst hip Left 05/23/2013   TRANSTHORACIC ECHOCARDIOGRAM  04-06-2006   LVSF NORMAL/  EF 60%/ AORTIC ROOT AT UPPER LIMITS OF NORMAL SIZE   TRANSURETHRAL RESECTION OF BLADDER TUMOR  10/08/2011   Procedure: CIRCUMCISION AND TRANSURETHRAL RESECTION OF BLADDER TUMOR (TURBT);  Surgeon: [name], MD;  Location: [facility];  Service: Urology;  Laterality: N/A;   TRANSURETHRAL RESECTION OF BLADDER TUMOR  11/30/2011   Procedure: TRANSURETHRAL RESECTION OF BLADDER TUMOR (TURBT);  Surgeon: [name], MD;  Location: [facility];  Service: Urology;  Laterality: N/A;   VIDEO BRONCHOSCOPY WITH ENDOBRONCHIAL ULTRASOUND Left 01/13/2022   Procedure: VIDEO BRONCHOSCOPY WITH ENDOBRONCHIAL ULTRASOUND;  Surgeon: Garner Nash, DO;  Location: [facility];  Service: Pulmonary;  Laterality: Left;   Social History:  reports that he has quit smoking. His smokeless tobacco use includes chew. He reports that he does not drink alcohol and does not use drugs.  Allergies  Allergen Reactions   Sildenafil Other (See Comments)    Caused a headache    Family History  Problem Relation Age of Onset   Lung disease Father    Cancer Father        lung  Cancer Sister        lung   Cancer Sister        lung   Cancer Sister        lung   Gout Other    Heart disease Other    Hypertension Other    Colon cancer Neg Hx    Esophageal cancer Neg Hx    Stomach cancer Neg Hx    Rectal cancer Neg Hx     Prior to Admission medications   Medication Sig Start Date End Date Taking? Authorizing Provider  donepezil (ARICEPT) 5 MG tablet Take 5 mg by mouth at  bedtime.    [provider]  enoxaparin (LOVENOX) 80 MG/0.8ML injection Inject 0.7 mLs (70 mg total) into the skin every 12 hours. 01/19/22 03/20/22  Kathie Dike, MD  enoxaparin (LOVENOX) 80 MG/0.8ML injection Inject 0.7 mls into the skin every 12 hours 01/19/22   Kathie Dike, MD  folic acid (FOLVITE) 1 MG tablet Take 1 tablet (1 mg total) by mouth daily. 01/28/22   Curt Bears, MD  lidocaine-prilocaine (EMLA) cream Apply to the Port-A-Cath site 30-60-minutes before treatment 01/28/22   Curt Bears, MD  losartan-hydrochlorothiazide [facility]) 100-25 MG tablet TAKE 1 TABLET BY MOUTH DAILY 01/12/22   Hoyt Koch, MD  metFORMIN (GLUCOPHAGE) 1000 MG tablet TAKE 1 TABLET(1000 MG) BY MOUTH DAILY WITH BREAKFAST 01/21/22   Hoyt Koch, MD  metoprolol succinate (TOPROL-XL) 25 MG 24 hr tablet Take 1 tablet by mouth in the morning and at bedtime. 01/19/22   Kathie Dike, MD  potassium chloride SA (KLOR-CON) 20 MEQ tablet Take 1 tablet (20 mEq total) by mouth daily. 11/11/20   Hoyt Koch, MD  primidone (MYSOLINE) 250 MG tablet Take 250 mg by mouth See admin instructions. 2 tablets in AM and 3 tablets at bedtime 11/07/18   [provider]  prochlorperazine (COMPAZINE) 10 MG tablet Take 1 tablet (10 mg total) by mouth every 6 (six) hours as needed for nausea or vomiting. 01/28/22   Curt Bears, MD  simvastatin (ZOCOR) 20 MG tablet TAKE 1 TABLET(20 MG) BY MOUTH AT BEDTIME Patient taking differently: Take 20 mg by mouth at bedtime. 07/02/21   Janith Lima, MD    Physical Exam: Vitals:   02/04/22 1800 02/04/22 1830 02/04/22 1910 02/04/22 2050  BP: (!) 151/82 (!) 151/86    Pulse: (!) 116 (!) 116 (!) 116   Resp: (!) 26 (!) 26 13   Temp:   100.1 F (37.8 C)   TempSrc:      SpO2: 94% 94% 94%   Weight:    78.3 kg  Constitutional: NAD, calm, comfortable, Eyes: PERRL, lids and conjunctivae normal ENMT: Mucous membranes are moist.  Neck: normal,  supple Respiratory: Crackles throughout when laying flat.  Normal respiratory effort. No accessory muscle use.  On 3 L via nasal cannula. Cardiovascular: Regular rate and rhythm, no murmurs / rubs / gallops. No extremity edema.  Abdomen: Soft, nondistended, no tenderness.  Bowel sounds positive.  Musculoskeletal: no clubbing / cyanosis. No joint deformity upper and lower extremities. Good ROM, no contractures. Normal muscle tone.  Skin: no rashes, lesions, ulcers. No induration Neurologic: CN 2-12 grossly intact.  Alert and oriented only to self and place.  Appears to have some incoherent speech at times. Psychiatric: Appears confused.  Alert and oriented only to self and place.   Data Reviewed:  See HPI  Assessment and Plan: * Pleural effusion moderate right and large left pleural  effusion seen incidentally on CT C-spine -obtain thoracentesis with fluid study although most certainly to be malignant effusion  Acute metabolic encephalopathy Likely secondary to recent chemotherapy and sepsis. Currently alert and oriented to self and place which is not baseline per wife.  Acute kidney injury superimposed on chronic kidney disease (HCC) Creatinine elevated 1.96 up from 1.68 with baseline of around 1 -will need to hold on fluid for now due to large bilateral pleural effusions  CAP (community acquired pneumonia) Sepsis secondary to CAP. Patient presented with fever, tachycardia and tachypnea. - Continue IV Rocephin and azithromycin - Obtain sputum culture, Legionella and strep urine antigen  History of pulmonary embolus (PE) -Pulmonary embolus involving large right main and multiple peripheral pulmonary emboli diagnosed in July 2023.  He is currently on treatment with Lovenox 1 Mg/kg every 12 hours.  Oncology recommends to continue on this treatment for at least 1-2 months before switching to Eliquis orally. -Switch to IV heparin infusion for now since he will be getting thoracentesis  tomorrow  Adenocarcinoma of left lung, stage 4 ([place]) -lung adenocarcinoma dx around July 2023. Follows with Dr.Mohamed. Only has palliative treatment options and plans to proceed with systemic chemotherapy in combination with immunotherapy. Had chemo yesterday  August 9th but started to have diarrhea and encephalopathy.  -Discussed with wife regarding the effects of his recent chemotherapy. She is starting to think about his goals of care. Consider palliative consult this admission.   DM2 (diabetes mellitus, type 2) ([place]) Controlled. Monitor without SSI for now.  HTN (hypertension) Hold Losartan-HCTZ due to AKI -continue other antihypertensives      Advance Care Planning:   Code Status: DNR -confirmed with wife  Consults: None  Family Communication: Discussed   Severity of Illness: The appropriate patient status for this patient is INPATIENT. Inpatient status is judged to be reasonable and necessary in order to provide the required intensity of service to ensure the patient's safety. The patient's presenting symptoms, physical exam findings, and initial radiographic and laboratory data in the context of their chronic comorbidities is felt to place them at high risk for further clinical deterioration. Furthermore, it is not anticipated that the patient will be medically stable for discharge from the hospital within 2 midnights of admission.   * I certify that at the point of admission it is my clinical judgment that the patient will require inpatient hospital care spanning beyond 2 midnights from the point of admission due to high intensity of service, high risk for further deterioration and high frequency of surveillance required.*  Author: Orene Desanctis, DO 02/04/2022 9:31 PM  For on call review [email or web address].

## 2022-02-04 NOTE — ED Provider Notes (Signed)
[place] DEPT Provider Note   CSN: [id number] Arrival date & time: 02/04/22  1422     History  Chief Complaint  Patient presents with   Weakness    Drew Harrington is a 76 y.o. male.  Patient is a 76 year old male with a past medical history of stage IV adenocarcinoma of the lung, recently started chemotherapy yesterday, prior PEs on Lovenox, hypertension, hyperlipidemia, diabetes, CKD presenting to the emergency department for generalized weakness as well as a fall.  Patient states that he fell getting out of the bathtub this morning.  He states that he felt lightheaded and weak.  He states he believes he hit his head but is unsure if he passed out.  He states he feels nauseous but has not had any vomiting.  He states he feels generally weak all over.  He denies any numbness or tingling.  He states that he has some mild headache.  He denies any neck pain.  He denies any chest pain or shortness of breath.  He denies any fevers states he has some mild cough.  The history is provided by the patient.  Weakness      Home Medications Prior to Admission medications   Medication Sig Start Date End Date Taking? Authorizing Provider  donepezil (ARICEPT) 5 MG tablet Take 5 mg by mouth at bedtime.    [provider]  enoxaparin (LOVENOX) 80 MG/0.8ML injection Inject 0.7 mLs (70 mg total) into the skin every 12 hours. 01/19/22 03/20/22  Kathie Dike, MD  enoxaparin (LOVENOX) 80 MG/0.8ML injection Inject 0.7 mls into the skin every 12 hours 01/19/22   Kathie Dike, MD  folic acid (FOLVITE) 1 MG tablet Take 1 tablet (1 mg total) by mouth daily. 01/28/22   Curt Bears, MD  lidocaine-prilocaine (EMLA) cream Apply to the Port-A-Cath site 30-60-minutes before treatment 01/28/22   Curt Bears, MD  losartan-hydrochlorothiazide [facility]) 100-25 MG tablet TAKE 1 TABLET BY MOUTH DAILY 01/12/22   Hoyt Koch, MD  metFORMIN (GLUCOPHAGE) 1000 MG  tablet TAKE 1 TABLET(1000 MG) BY MOUTH DAILY WITH BREAKFAST 01/21/22   Hoyt Koch, MD  metoprolol succinate (TOPROL-XL) 25 MG 24 hr tablet Take 1 tablet by mouth in the morning and at bedtime. 01/19/22   Kathie Dike, MD  potassium chloride SA (KLOR-CON) 20 MEQ tablet Take 1 tablet (20 mEq total) by mouth daily. 11/11/20   Hoyt Koch, MD  primidone (MYSOLINE) 250 MG tablet Take 250 mg by mouth See admin instructions. 2 tablets in AM and 3 tablets at bedtime 11/07/18   [provider]  prochlorperazine (COMPAZINE) 10 MG tablet Take 1 tablet (10 mg total) by mouth every 6 (six) hours as needed for nausea or vomiting. 01/28/22   Curt Bears, MD  simvastatin (ZOCOR) 20 MG tablet TAKE 1 TABLET(20 MG) BY MOUTH AT BEDTIME Patient taking differently: Take 20 mg by mouth at bedtime. 07/02/21   Janith Lima, MD      Allergies    Sildenafil    Review of Systems   Review of Systems  Neurological:  Positive for weakness.    Physical Exam Updated Vital Signs BP (!) 146/92   Pulse (!) 125   Temp 99.9 F (37.7 C) (Oral)   Resp (!) 27   SpO2 93%  Physical Exam Vitals and nursing note reviewed.  Constitutional:      Appearance: He is ill-appearing.     Comments: Drowsy, intermittently falling asleep on exam, easily about her  stable to verbal stimulation  HENT:     Head: Normocephalic and atraumatic.     Nose: Nose normal.     Mouth/Throat:     Comments: Lips are dry, moist mucous membranes Eyes:     Extraocular Movements: Extraocular movements intact.     Conjunctiva/sclera: Conjunctivae normal.     Pupils: Pupils are equal, round, and reactive to light.  Neck:     Comments: C-collar in place Cardiovascular:     Rate and Rhythm: Regular rhythm. Tachycardia present.     Pulses: Normal pulses.     Heart sounds: Normal heart sounds.  Pulmonary:     Effort: Pulmonary effort is normal. No respiratory distress.     Breath sounds: Normal breath sounds.  Chest:      Chest wall: No tenderness.  Abdominal:     General: Abdomen is flat.     Palpations: Abdomen is soft.     Tenderness: There is no abdominal tenderness.  Musculoskeletal:        General: No swelling or tenderness. Normal range of motion.     Cervical back: No tenderness.  Skin:    General: Skin is warm and dry.  Neurological:     General: No focal deficit present.     Mental Status: He is oriented to person, place, and time.     Sensory: No sensory deficit.     Comments: Generally weak, 4-5 strength in all 4 extremities, sensation intact  Psychiatric:        Judgment: Judgment normal.     Comments: Flat affect     ED Results / Procedures / Treatments   Labs (all labs ordered are listed, but only abnormal results are displayed) Labs Reviewed  CBC WITH DIFFERENTIAL/PLATELET - Abnormal; Notable for the following components:      Result Value   RDW 17.2 (*)    Neutro Abs 8.1 (*)    Lymphs Abs 0.6 (*)    All other components within normal limits  PROTIME-INR - Abnormal; Notable for the following components:   Prothrombin Time 19.5 (*)    INR 1.7 (*)    All other components within normal limits  APTT - Abnormal; Notable for the following components:   aPTT 22 (*)    All other components within normal limits  LACTIC ACID, PLASMA - Abnormal; Notable for the following components:   Lactic Acid, Venous 2.8 (*)    All other components within normal limits  CBG MONITORING, ED - Abnormal; Notable for the following components:   Glucose-Capillary 114 (*)    All other components within normal limits  URINALYSIS, ROUTINE W REFLEX MICROSCOPIC  LACTIC ACID, PLASMA  COMPREHENSIVE METABOLIC PANEL  LIPASE, BLOOD  MAGNESIUM  PHOSPHORUS  URIC ACID    EKG EKG Interpretation  Date/Time:  Thursday February 04 2022 15:17:48 EDT Ventricular Rate:  123 PR Interval:    QRS Duration: 81 QT Interval:  325 QTC Calculation: 465 R Axis:   -26 Text Interpretation: Sinus tachycardia  Borderline left axis deviation Nonspecific T abnrm, anterolateral leads RBBB on previous EKG now resolved Confirmed by Oneal Deputy [phone]) on 02/04/2022 3:27:56 PM  Radiology CT Head Wo Contrast  Result Date: 02/04/2022 CLINICAL DATA:  Generalized weakness. EXAM: CT HEAD WITHOUT CONTRAST CT CERVICAL SPINE WITHOUT CONTRAST TECHNIQUE: Multidetector CT imaging of the head and cervical spine was performed following the standard protocol without intravenous contrast. Multiplanar CT image reconstructions of the cervical spine were also generated. RADIATION DOSE REDUCTION: This exam  was performed according to the departmental dose-optimization program which includes automated exposure control, adjustment of the mA and/or kV according to patient size and/or use of iterative reconstruction technique. COMPARISON:  12/30/2021 CT head, 01/24/2022 MRI head; no prior cervical spine CT, correlation is made with 04/17/2013 cervical spine radiographs FINDINGS: CT HEAD FINDINGS Evaluation is somewhat limited by motion artifact. Brain: No evidence of acute infarct, hemorrhage, mass, mass effect, or midline shift. No hydrocephalus or extra-axial fluid collection. Remote left occipital infarct. Redemonstrated lacunar infarcts in the left basal ganglia, left corona radiata, and bilateral thalami. Periventricular white matter changes, likely the sequela of chronic small vessel ischemic disease. Vascular: No hyperdense vessel. Skull: Normal. Negative for fracture or focal lesion. Sinuses/Orbits: Partial opacification of the posterior left ethmoid air cells and left sphenoid sinus. Status post bilateral lens replacements. Other: The mastoid air cells are well aerated. CT CERVICAL SPINE FINDINGS Alignment: Straightening of the normal cervical lordosis. No listhesis. Skull base and vertebrae: No acute fracture. No primary bone lesion or focal pathologic process. Soft tissues and spinal canal: No prevertebral fluid or swelling. No  visible canal hematoma. Disc levels: Mild degenerative changes for age. No high-grade spinal canal stenosis. Upper chest: Left apical mass, which extends into the inferior left neck, which is better evaluated on the 01/15/2022 CT chest. Moderate right and large left pleural effusions, new on the right and increased on the left compared to 01/15/2022 Other: Presumed nodal metastasis in the right neck (series 4, image 40) is poorly evaluated in the absence of intravenous contrast. Additional prominent right level 2 lymph nodes (series 4, image 53) and left level 3 lymph node (series 4, image 80). IMPRESSION: 1.  No acute intracranial process. 2.  No acute fracture or traumatic listhesis in the cervical spine. 3. Known left apical mass is incompletely evaluated. Moderate right and large left pleural effusions. 4. Presumed nodal metastases in the bilateral cervical lymph nodes. Electronically Signed   By: Merilyn Baba M.D.   On: 02/04/2022 16:19   CT Cervical Spine Wo Contrast  Result Date: 02/04/2022 CLINICAL DATA:  Generalized weakness. EXAM: CT HEAD WITHOUT CONTRAST CT CERVICAL SPINE WITHOUT CONTRAST TECHNIQUE: Multidetector CT imaging of the head and cervical spine was performed following the standard protocol without intravenous contrast. Multiplanar CT image reconstructions of the cervical spine were also generated. RADIATION DOSE REDUCTION: This exam was performed according to the departmental dose-optimization program which includes automated exposure control, adjustment of the mA and/or kV according to patient size and/or use of iterative reconstruction technique. COMPARISON:  12/30/2021 CT head, 01/24/2022 MRI head; no prior cervical spine CT, correlation is made with 04/17/2013 cervical spine radiographs FINDINGS: CT HEAD FINDINGS Evaluation is somewhat limited by motion artifact. Brain: No evidence of acute infarct, hemorrhage, mass, mass effect, or midline shift. No hydrocephalus or extra-axial fluid  collection. Remote left occipital infarct. Redemonstrated lacunar infarcts in the left basal ganglia, left corona radiata, and bilateral thalami. Periventricular white matter changes, likely the sequela of chronic small vessel ischemic disease. Vascular: No hyperdense vessel. Skull: Normal. Negative for fracture or focal lesion. Sinuses/Orbits: Partial opacification of the posterior left ethmoid air cells and left sphenoid sinus. Status post bilateral lens replacements. Other: The mastoid air cells are well aerated. CT CERVICAL SPINE FINDINGS Alignment: Straightening of the normal cervical lordosis. No listhesis. Skull base and vertebrae: No acute fracture. No primary bone lesion or focal pathologic process. Soft tissues and spinal canal: No prevertebral fluid or swelling. No visible canal hematoma. Disc  levels: Mild degenerative changes for age. No high-grade spinal canal stenosis. Upper chest: Left apical mass, which extends into the inferior left neck, which is better evaluated on the 01/15/2022 CT chest. Moderate right and large left pleural effusions, new on the right and increased on the left compared to 01/15/2022 Other: Presumed nodal metastasis in the right neck (series 4, image 40) is poorly evaluated in the absence of intravenous contrast. Additional prominent right level 2 lymph nodes (series 4, image 53) and left level 3 lymph node (series 4, image 80). IMPRESSION: 1.  No acute intracranial process. 2.  No acute fracture or traumatic listhesis in the cervical spine. 3. Known left apical mass is incompletely evaluated. Moderate right and large left pleural effusions. 4. Presumed nodal metastases in the bilateral cervical lymph nodes. Electronically Signed   By: Merilyn Baba M.D.   On: 02/04/2022 16:19   DG Chest 1 View  Result Date: 02/04/2022 CLINICAL DATA:  Syncope EXAM: CHEST  1 VIEW COMPARISON:  Chest x-ray dated January 22, 2022 FINDINGS: Visualized cardiac and mediastinal contours are within  normal limits. Left suprahilar mass compatible with known malignancy. New large right pleural effusion. Evidence of pneumothorax. IMPRESSION: Left suprahilar mass with new large left pleural effusion. Electronically Signed   By: Yetta Glassman M.D.   On: 02/04/2022 15:42    Procedures Procedures    Medications Ordered in ED Medications  sodium chloride 0.9 % bolus 1,000 mL (1,000 mLs Intravenous New Bag/Given 02/04/22 1649)    ED Course/ Medical Decision Making/ A&P Clinical Course as of 02/04/22 1716  Thu Feb 04, 2022  1607 Chest x-ray concerning for new left-sided large pleural effusion.  Patient satting well on room air in no acute respiratory distress that this may be contributing to his tachycardia and feeling unwell. [VK]  2841 CT imaging negative for acute traumatic injury. [VK]  3244 Patient will have a rectal temperature performed to evaluate for fever due to his low-grade temp that was measured either orally or axillary.  The patient's elevated lactate and tachycardia is concerning for possible sepsis.  Patient signed out to Dr. Tamera Punt pending remainder of his workup with plan for admission due to his lethargy in the setting of recent initiation of chemotherapy. [VK]    Clinical Course User Index [VK] Ottie Glazier, DO                           Medical Decision Making Patient is a 76 year old male with a past medical history of stage IV adenocarcinoma of the lung, recently started chemotherapy yesterday, prior PEs on Lovenox, hypertension, hyperlipidemia, diabetes, CKD presenting to the emergency department for generalized weakness as well as a fall.  Due to patient's fall with drowsiness on exam and being on Lovenox, and concern for ICH or mass effect and CT head and C-spine will be performed.  He is in c-collar.  No other signs of injury are seen on exam.  I am additionally concerned for tumor lysis syndrome, electrolyte abnormality, kidney injury, ACS, arrhythmia, anemia,  infection or sepsis as causes of his generalized weakness and lethargy.  Patient will begin fluid resuscitation and will be closely monitored.  Amount and/or Complexity of Data Reviewed External Data Reviewed: notes.    Details: Reviewed patient's notes, chemotherapy for session was done yesterday.  He was due for an appointment today but canceled due to his generalized weakness and feeling unwell and presented to the ED instead.  Labs: ordered. Decision-making details documented in ED Course. Radiology: ordered. Decision-making details documented in ED Course. ECG/medicine tests: ordered. Decision-making details documented in ED Course.           Final Clinical Impression(s) / ED Diagnoses Final diagnoses:  None    Rx / DC Orders ED Discharge Orders     None         Ottie Glazier, DO 02/04/22 1716

## 2022-02-04 NOTE — Progress Notes (Addendum)
ANTICOAGULATION CONSULT NOTE - Initial Consult  Pharmacy Consult for Lovenox SQ >> heparin IV Indication: history of pulmonary embolus/DVT  Allergies  Allergen Reactions   Sildenafil Other (See Comments)    Caused a headache    Patient Measurements: Weight: 78.3 kg (172 lb 9.9 oz) Heparin Dosing Weight: 78.3 kg  Vital Signs: Temp: 100.1 F (37.8 C) (08/10 1910) Temp Source: Rectal (08/10 1758) BP: 151/86 (08/10 1830) Pulse Rate: 116 (08/10 1910)  Labs: Recent Labs    02/03/22 1327 02/03/22 1350 02/04/22 1510 02/04/22 1622  HGB 15.3  --  15.5  --   HCT 44.5  --  49.0  --   PLT 290  --  286  --   APTT  --   --  22*  --   LABPROT  --   --  19.5*  --   INR  --   --  1.7*  --   CREATININE  --  1.68*  --  1.96*    Estimated Creatinine Clearance: 32.1 mL/min (A) (by C-G formula based on SCr of 1.96 mg/dL (H)).   Medical History: 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[place])    Borderline diabetes    Cataract    bilateral lens implants   Diabetes mellitus without complication (HCC)    HTN (hypertension)    Hyperlipidemia    Insomnia, unspecified    Perirectal abscess 05/10/2013   Personal history of colonic adenomas 04/10/2010   Psychosexual dysfunction with inhibited sexual excitement    Stroke ([place])    Subacute intracranial hemorrhage (HCC)    Unspecified hemorrhoids without mention of complication     Medications:  Scheduled:   Assessment: 76 yo male presenting with weakness and a fall.  PMH includes stage IV adenocarcinoma of the lungs (started chemo 02/03/22) and hx PE/DVT in July 2023 for which patient was discharged on Lovenox 1mg /kg (70mg ) every 12 hours SQ. Patient unable to recall last dose at this time. CT head negative. CT cervical spine showed a moderate right and large left pleural effusion. Orders placed for thoracentesis - pharmacy has been consulted to dose IV heparin in the meantime. Ok to begin heparin drip  now per MD.   Baseline labs: aPTT 22 seconds, INR 1.7, Hgb 15.5, Plt 286 Updated weight per RN is 78.3 kg  Goal of Therapy:  Heparin level 0.3-0.7 units/ml Monitor platelets by anticoagulation protocol: Yes   Plan:  No bolus since patient is on Lovenox PTA Begin heparin infusion at 1300 units/hr Check heparin level 8 hours after infusion start Monitor daily heparin level and CBC Monitor for s/sx bleeding F/u ability to resume PTA Lovenox after thoracentesis  Dimple Nanas, PharmD, BCPS 02/04/2022 8:52 PM

## 2022-02-05 ENCOUNTER — Inpatient Hospital Stay (HOSPITAL_COMMUNITY): Payer: Medicare Other

## 2022-02-05 DIAGNOSIS — Z7189 Other specified counseling: Secondary | ICD-10-CM

## 2022-02-05 DIAGNOSIS — J189 Pneumonia, unspecified organism: Secondary | ICD-10-CM | POA: Diagnosis not present

## 2022-02-05 DIAGNOSIS — Z515 Encounter for palliative care: Secondary | ICD-10-CM

## 2022-02-05 DIAGNOSIS — J90 Pleural effusion, not elsewhere classified: Secondary | ICD-10-CM | POA: Diagnosis not present

## 2022-02-05 DIAGNOSIS — A419 Sepsis, unspecified organism: Secondary | ICD-10-CM | POA: Diagnosis not present

## 2022-02-05 DIAGNOSIS — G9340 Encephalopathy, unspecified: Secondary | ICD-10-CM | POA: Diagnosis not present

## 2022-02-05 DIAGNOSIS — R6520 Severe sepsis without septic shock: Secondary | ICD-10-CM | POA: Diagnosis not present

## 2022-02-05 DIAGNOSIS — Z86711 Personal history of pulmonary embolism: Secondary | ICD-10-CM | POA: Diagnosis not present

## 2022-02-05 DIAGNOSIS — C3492 Malignant neoplasm of unspecified part of left bronchus or lung: Secondary | ICD-10-CM | POA: Diagnosis not present

## 2022-02-05 LAB — BLOOD CULTURE ID PANEL (REFLEXED) - BCID2

## 2022-02-05 LAB — URINALYSIS, ROUTINE W REFLEX MICROSCOPIC
Glucose, UA: NEGATIVE mg/dL
Hgb urine dipstick: NEGATIVE
Ketones, ur: NEGATIVE mg/dL
Leukocytes,Ua: NEGATIVE
Nitrite: NEGATIVE
Protein, ur: 100 mg/dL — AB
Specific Gravity, Urine: 1.026 (ref 1.005–1.030)
pH: 5 (ref 5.0–8.0)

## 2022-02-05 LAB — CBC
HCT: 45.4 % (ref 39.0–52.0)
Hemoglobin: 14.4 g/dL (ref 13.0–17.0)
MCH: 31.4 pg (ref 26.0–34.0)
MCHC: 31.7 g/dL (ref 30.0–36.0)
MCV: 98.9 fL (ref 80.0–100.0)
Platelets: 224 10*3/uL (ref 150–400)
RBC: 4.59 MIL/uL (ref 4.22–5.81)
RDW: 17 % — ABNORMAL HIGH (ref 11.5–15.5)
WBC: 10.8 10*3/uL — ABNORMAL HIGH (ref 4.0–10.5)
nRBC: 0 % (ref 0.0–0.2)

## 2022-02-05 LAB — COMPREHENSIVE METABOLIC PANEL
ALT: 36 U/L (ref 0–44)
AST: 47 U/L — ABNORMAL HIGH (ref 15–41)
Albumin: 2.6 g/dL — ABNORMAL LOW (ref 3.5–5.0)
Alkaline Phosphatase: 100 U/L (ref 38–126)
Anion gap: 9 (ref 5–15)
BUN: 33 mg/dL — ABNORMAL HIGH (ref 8–23)
CO2: 24 mmol/L (ref 22–32)
Calcium: 8.5 mg/dL — ABNORMAL LOW (ref 8.9–10.3)
Chloride: 110 mmol/L (ref 98–111)
Creatinine, Ser: 1.88 mg/dL — ABNORMAL HIGH (ref 0.61–1.24)
GFR, Estimated: 37 mL/min — ABNORMAL LOW (ref >60)
Glucose, Bld: 122 mg/dL — ABNORMAL HIGH (ref 70–99)
Potassium: 4.4 mmol/L (ref 3.5–5.1)
Sodium: 143 mmol/L (ref 135–145)
Total Bilirubin: 0.5 mg/dL (ref 0.3–1.2)
Total Protein: 6.1 g/dL — ABNORMAL LOW (ref 6.5–8.1)

## 2022-02-05 LAB — GRAM STAIN

## 2022-02-05 LAB — LACTATE DEHYDROGENASE, PLEURAL OR PERITONEAL FLUID: LD, Fluid: 502 U/L — ABNORMAL HIGH (ref 3–23)

## 2022-02-05 LAB — GLUCOSE, PLEURAL OR PERITONEAL FLUID: Glucose, Fluid: 143 mg/dL

## 2022-02-05 LAB — BODY FLUID CELL COUNT WITH DIFFERENTIAL
Eos, Fluid: 0 %
Lymphs, Fluid: 16 %
Monocyte-Macrophage-Serous Fluid: 73 % (ref 50–90)
Neutrophil Count, Fluid: 11 % (ref 0–25)
Total Nucleated Cell Count, Fluid: 448 cu mm (ref 0–1000)

## 2022-02-05 LAB — PROTEIN, PLEURAL OR PERITONEAL FLUID: Total protein, fluid: 4 g/dL

## 2022-02-05 LAB — HEPARIN LEVEL (UNFRACTIONATED): Heparin Unfractionated: 0.76 IU/mL — ABNORMAL HIGH (ref 0.30–0.70)

## 2022-02-05 LAB — GLUCOSE, CAPILLARY
Glucose-Capillary: 140 mg/dL — ABNORMAL HIGH (ref 70–99)
Glucose-Capillary: 89 mg/dL (ref 70–99)

## 2022-02-05 LAB — STREP PNEUMONIAE URINARY ANTIGEN: Strep Pneumo Urinary Antigen: NEGATIVE

## 2022-02-05 LAB — LACTATE DEHYDROGENASE: LDH: 557 U/L — ABNORMAL HIGH (ref 98–192)

## 2022-02-05 MED ORDER — LIDOCAINE HCL 1 % IJ SOLN
INTRAMUSCULAR | Status: AC
  Filled 2022-02-05: qty 20

## 2022-02-05 MED ORDER — SODIUM CHLORIDE 0.9 % IV SOLN
2.0000 g | Freq: Two times a day (BID) | INTRAVENOUS | Status: DC
  Administered 2022-02-05 – 2022-02-07 (×4): 2 g via INTRAVENOUS
  Filled 2022-02-05 (×4): qty 12.5

## 2022-02-05 MED ORDER — INSULIN ASPART 100 UNIT/ML IJ SOLN
0.0000 [IU] | Freq: Three times a day (TID) | INTRAMUSCULAR | Status: DC
  Administered 2022-02-05 – 2022-02-17 (×6): 1 [IU] via SUBCUTANEOUS

## 2022-02-05 MED ORDER — SODIUM CHLORIDE 0.9 % IV SOLN: INTRAVENOUS | Status: DC

## 2022-02-05 MED ORDER — ENOXAPARIN SODIUM 80 MG/0.8ML IJ SOSY
70.0000 mg | PREFILLED_SYRINGE | Freq: Two times a day (BID) | INTRAMUSCULAR | Status: DC
  Administered 2022-02-05 – 2022-02-13 (×16): 70 mg via SUBCUTANEOUS
  Filled 2022-02-05 (×17): qty 0.8

## 2022-02-05 NOTE — Progress Notes (Signed)
PT Cancellation Note  Patient Details Name: Drew Harrington MRN: [id number] DOB: September 30, 1945   Cancelled Treatment:    Reason Eval/Treat Not Completed: Patient at procedure or test/unavailable, recent  thoracentesis. Will check back tomorrow. [place] Office [phone] Weekend pager-[phone]    Claretha Cooper 02/05/2022, 2:38 PM

## 2022-02-05 NOTE — Progress Notes (Signed)
Initial Nutrition Assessment  DOCUMENTATION CODES:   Not applicable  INTERVENTION:   -Continue regular diet -Continue Ensure Enlive po BID, each supplement provides 350 kcal and 20 grams of protein -MVI with minerals daily  NUTRITION DIAGNOSIS:   Increased nutrient needs related to chronic illness (lung cancer) as evidenced by estimated needs.  GOAL:   Patient will meet greater than or equal to 90% of their needs  MONITOR:   PO intake, Supplement acceptance  REASON FOR ASSESSMENT:   Malnutrition Screening Tool    ASSESSMENT:   Pt with medical history significant of stage IVB non-small cell lung cancer, adenocarcinoma with large apical LUL mass, with mediastinal, right cervical and bilateral adrenal metastases, PE involving large right main on Lovenox (dx 12/2021), HTN, dementia, CKD 3a who presents with diarrhea  and weakness.  Pt admitted with rt and lt pleural effusions and acute metabolic encephalopathy.   Reviewed I/O's: +1.4 L x 24 hours  UOP: 100 ml x 24 hours   Pt unavailable at time of visit. Attempted to speak with pt via call to hospital room phone, however, unable to reach. RD unable to obtain further nutrition-related history or complete nutrition-focused physical exam at this time.    Per MD notes, pt currently alert and oriented to self and place, which is not his baseline.   Pt currently on a regular diet. No meal completion data available to assess at this time. Pt is drinking Ensure supplements.   Reviewed wt hx; pt has experienced a 4.6% wt loss over the past 3 months, which is not significant for time frame.   Palliative care consult pending for goals of care.   Medications reviewed and include 0.9% sodium chloride infusion @ 100 ml/hr.   Labs reviewed: CBGS: 114 (inpatient orders for glycemic control are 0-9 units insulin aspart TID with meals).   Diet Order:   Diet Order             Diet regular Room service appropriate? Yes; Fluid  consistency: Thin  Diet effective now                   EDUCATION NEEDS:   No education needs have been identified at this time  Skin:  Skin Assessment: Reviewed RN Assessment  Last BM:  Unknown  Height:   Ht Readings from Last 1 Encounters:  02/04/22 5\' 9"  (1.753 m)    Weight:   Wt Readings from Last 1 Encounters:  02/04/22 78.3 kg    Ideal Body Weight:  72.7 kg  BMI:  Body mass index is 25.49 kg/m.  Estimated Nutritional Needs:   Kcal:  2150-2350  Protein:  105-120 grams  Fluid:  > 2 L    Loistine Chance, RD, LDN, [place] Registered Dietitian II Certified Diabetes Care and Education Specialist Please refer to [facility] for RD and/or RD on-call/weekend/after hours pager

## 2022-02-05 NOTE — Progress Notes (Signed)
ANTICOAGULATION CONSULT NOTE   Pharmacy Consult for Heparin Indication: history of recent pulmonary embolus and DVT  Allergies  Allergen Reactions   Sildenafil Other (See Comments)    Caused a headache    Patient Measurements: Height: 5\' 9"  (175.3 cm) Weight: 78.3 kg (172 lb 9.9 oz) IBW/kg (Calculated) : 70.7 Heparin Dosing Weight: 78.3 kg  Vital Signs: Temp: 100.1 F (37.8 C) (08/11 0833) Temp Source: Oral (08/11 0833) BP: 110/91 (08/11 0833) Pulse Rate: 101 (08/11 0833)  Labs: Recent Labs    02/03/22 1327 02/03/22 1350 02/04/22 1510 02/04/22 1622 02/05/22 0944  HGB 15.3  --  15.5  --  14.4  HCT 44.5  --  49.0  --  45.4  PLT 290  --  286  --  224  APTT  --   --  22*  --   --   LABPROT  --   --  19.5*  --   --   INR  --   --  1.7*  --   --   HEPARINUNFRC  --   --   --   --  0.76*  CREATININE  --  1.68*  --  1.96* 1.88*    Estimated Creatinine Clearance: 33.4 mL/min (A) (by C-G formula based on SCr of 1.88 mg/dL (H)).   Medications:  PTA Lovenox 70 mg [place] q12h  Assessment: 66 YOM with stage IV adenocarcinoma of the lungs and recent PE/DVT (01/15/2022) on Lovenox PTA presented to the ED with weakness and a fall. Patient has been on Lovenox 1 mg/kg [place] q12h since discharge last month. On 8/10, the patient arrived to the ED with generalized weakness after starting chemo the day prior and reported falling and possibly hitting his head in the shower. CT head was negative, and CT cervical spine showed moderate right and large left pleural effusion. Patient has been switched from Lovenox to heparin gtt while waiting for thoracentesis.   Today, 02/05/22: - HL 0.76, supratherapeutic at 1,300 units/hr infusion - Hgb and Plt WNL - No bleeding documented  Goal of Therapy:  Heparin level 0.3-0.7 units/ml Monitor platelets by anticoagulation protocol: Yes   Plan:  - Decrease heparin infusion rate to 1,200 units/hr - Repeat heparin level 8 hrs after infusion rate change -  Monitor heparin level daily - Monitor Hgb and Plt daily - Monitor for sxs of bleeding  Cannon Kettle, PharmD Candidate 02/05/2022 10:56 AM

## 2022-02-05 NOTE — Procedures (Signed)
PROCEDURE SUMMARY:  Successful US guided diagnostic and therapeutic left thoracentesis. Yielded 1.1 L of clear, amber fluid. Pt tolerated procedure well. No immediate complications.  Specimen was sent for labs. CXR ordered.  EBL < 1 mL  Tyson Alias, AGNP 02/05/2022 1:44 PM

## 2022-02-05 NOTE — Progress Notes (Signed)
PROGRESS NOTE    Drew Harrington  YKD:[id number] DOB: 17-Nov-1945 DOA: 02/04/2022 PCP: Hoyt Koch, MD     Brief Narrative:  Drew Harrington is a 76 y.o. male with medical history significant of stage IVB non-small cell lung cancer, adenocarcinoma with large apical LUL mass, with mediastinal, right cervical and bilateral adrenal metastases, PE involving large right main on Lovenox (dx 12/2021), HTN, dementia, CKD 3a who presents with diarrhea  and weakness.  Patient just received first session of chemotherapy on 8/9 and then started to have diarrhea.  He slipped in the bathtub while trying to wash out and subsequently was sent to ED.  In the ED, patient had a fever, was tachycardic and tachypneic.  With elevated creatinine.  CT head and cervical spine were negative for injuries.  He was also found to have large left pleural effusion.  His Lovenox was switched to IV heparin in anticipation for thoracentesis.  New events last 24 hours / Subjective: "Want to go home"   Assessment & Plan:   Principal Problem:   Sepsis ([place]) Active Problems:   HTN (hypertension)   DM2 (diabetes mellitus, type 2) ([place])   Adenocarcinoma of left lung, stage 4 (HCC)   Pleural effusion   History of pulmonary embolus (PE)   CAP (community acquired pneumonia)   Acute kidney injury superimposed on chronic kidney disease ([place])   Acute metabolic encephalopathy   Severe sepsis secondary to pneumonia -Sepsis present on admission with fever, tachycardia, tachypnea with endorgan damage including AKI and respiratory failure -Blood cultures pending -Rocephin, azithromycin  Large left pleural effusion -Thoracentesis ordered  AKI on CKD 3a  -Baseline creatinine 1.4 -Give gentle IV fluids  Acute metabolic encephalopathy -Insetting of illnesses as above  History of PE -Was on Lovenox prior to admission.  Currently on IV heparin pending thoracentesis.  After procedure, can transition back to  Lovenox  Stage IV adenocarcinoma left lung -Followed by Dr. Julien Nordmann -Currently undergoing systemic chemotherapy -Palliative care consulted   Diabetes mellitus type 2 -a1c 6.3 -Sliding scale insulin  Hypertension -Losartan/HCTZ on hold due to AKI  DVT prophylaxis: IV heparin  Code Status: DNR Family Communication: Wife over the phone  Disposition Plan:  Status is: Inpatient Remains inpatient appropriate because: IV antibiotics, thoracentesis, IV fluid   Antimicrobials:  Anti-infectives (From admission, onward)    Start     Dose/Rate Route Frequency Ordered Stop   02/05/22 1800  cefTRIAXone (ROCEPHIN) 2 g in sodium chloride 0.9 % 100 mL IVPB        2 g 200 mL/hr over 30 Minutes Intravenous Every 24 hours 02/04/22 2026 02/10/22 1759   02/05/22 1800  azithromycin (ZITHROMAX) 500 mg in sodium chloride 0.9 % 250 mL IVPB        500 mg 250 mL/hr over 60 Minutes Intravenous Every 24 hours 02/04/22 2026 02/10/22 1759   02/04/22 1800  cefTRIAXone (ROCEPHIN) 1 g in sodium chloride 0.9 % 100 mL IVPB        1 g 200 mL/hr over 30 Minutes Intravenous  Once 02/04/22 1757 02/04/22 2020   02/04/22 1800  azithromycin (ZITHROMAX) 500 mg in sodium chloride 0.9 % 250 mL IVPB        500 mg 250 mL/hr over 60 Minutes Intravenous  Once 02/04/22 1757 02/04/22 2146        Objective: Vitals:   02/05/22 0046 02/05/22 0547 02/05/22 0833 02/05/22 1259  BP: 129/69 (!) 150/94 (!) 110/91 118/70  Pulse: 99 (!) 106 (!)  101 97  Resp:  18 20 17   Temp: 99.9 F (37.7 C) 99.1 F (37.3 C) 100.1 F (37.8 C) 98.6 F (37 C)  TempSrc: Rectal Oral Oral   SpO2: 99% 99% 92% 94%  Weight:      Height:        Intake/Output Summary (Last 24 hours) at 02/05/2022 1311 Last data filed at 02/05/2022 0600 Gross per 24 hour  Intake 1452.28 ml  Output 100 ml  Net 1352.28 ml   Filed Weights   02/04/22 2050  Weight: 78.3 kg    Examination:  General exam: Appears calm and comfortable  Respiratory system:  Diminished breath sounds, no distress, on 3.5L O2  Cardiovascular system: S1 & S2 heard, RRR. No murmurs.  Gastrointestinal system: Abdomen is nondistended, soft and nontender. Normal bowel sounds heard. Central nervous system: Alert Extremities: Symmetric in appearance  Skin: No rashes, lesions or ulcers on exposed skin    Data Reviewed: I have personally reviewed following labs and imaging studies  CBC: Recent Labs  Lab 02/03/22 1327 02/04/22 1510 02/05/22 0944  WBC 9.1 9.5 10.8*  NEUTROABS 7.4 8.1*  --   HGB 15.3 15.5 14.4  HCT 44.5 49.0 45.4  MCV 93.5 97.2 98.9  PLT 290 286 540   Basic Metabolic Panel: Recent Labs  Lab 02/03/22 1350 02/04/22 1622 02/05/22 0944  NA 143 145 143  K 4.2 4.4 4.4  CL 107 110 110  CO2 25 24 24   GLUCOSE 115* 130* 122*  BUN 18 28* 33*  CREATININE 1.68* 1.96* 1.88*  CALCIUM 9.6 9.0 8.5*  MG  --  2.3  --   PHOS  --  3.7  --    GFR: Estimated Creatinine Clearance: 33.4 mL/min (A) (by C-G formula based on SCr of 1.88 mg/dL (H)). Liver Function Tests: Recent Labs  Lab 02/03/22 1350 02/04/22 1622 02/05/22 0944  AST 28 41 47*  ALT 27 32 36  ALKPHOS 127* 122 100  BILITOT 0.6 0.4 0.5  PROT 7.1 6.8 6.1*  ALBUMIN 3.3* 3.0* 2.6*   Recent Labs  Lab 02/04/22 1622  LIPASE 25   No results for input(s): "AMMONIA" in the last 168 hours. Coagulation Profile: Recent Labs  Lab 02/04/22 1510  INR 1.7*   Cardiac Enzymes: No results for input(s): "CKTOTAL", "CKMB", "CKMBINDEX", "TROPONINI" in the last 168 hours. BNP (last 3 results) No results for input(s): "PROBNP" in the last 8760 hours. HbA1C: No results for input(s): "HGBA1C" in the last 72 hours. CBG: Recent Labs  Lab 02/04/22 1526  GLUCAP 114*   Lipid Profile: No results for input(s): "CHOL", "HDL", "LDLCALC", "TRIG", "CHOLHDL", "LDLDIRECT" in the last 72 hours. Thyroid Function Tests: No results for input(s): "TSH", "T4TOTAL", "FREET4", "T3FREE", "THYROIDAB" in the last 72  hours. Anemia Panel: No results for input(s): "VITAMINB12", "FOLATE", "FERRITIN", "TIBC", "IRON", "RETICCTPCT" in the last 72 hours. Sepsis Labs: Recent Labs  Lab 02/04/22 1511 02/04/22 1711  LATICACIDVEN 2.8* 1.9    Recent Results (from the past 240 hour(s))  Culture, blood (routine x 2)     Status: None (Preliminary result)   Collection Time: 02/04/22  5:30 PM   Specimen: BLOOD  Result Value Ref Range Status   Specimen Description   Final    BLOOD LEFT ANTECUBITAL Performed at [facility] [address], [place], [place] [address]    Special Requests   Final    BOTTLES DRAWN AEROBIC AND ANAEROBIC Blood Culture adequate volume Performed at [facility],  [place] [address], [place], [place] [address]    Culture   Final    NO GROWTH < 12 HOURS Performed at [facility] [address], [place], [place] [address]    Report Status PENDING  Incomplete  Culture, blood (routine x 2)     Status: None (Preliminary result)   Collection Time: 02/04/22  5:58 PM   Specimen: BLOOD  Result Value Ref Range Status   Specimen Description   Final    BLOOD BLOOD LEFT ARM Performed at [facility] [address], [place], [place] [address]    Special Requests   Final    BOTTLES DRAWN AEROBIC AND ANAEROBIC Blood Culture adequate volume Performed at [facility] [address] [place], [place] [address]    Culture   Final    NO GROWTH < 12 HOURS Performed at [facility] [address], [place], [place] [address]    Report Status PENDING  Incomplete      Radiology Studies: CT Head Wo Contrast  Result Date: 02/04/2022 CLINICAL DATA:  Generalized weakness. EXAM: CT HEAD WITHOUT CONTRAST CT CERVICAL SPINE WITHOUT CONTRAST TECHNIQUE: Multidetector CT imaging of the head and cervical spine was performed following the standard protocol without intravenous contrast. Multiplanar CT image reconstructions of  the cervical spine were also generated. RADIATION DOSE REDUCTION: This exam was performed according to the departmental dose-optimization program which includes automated exposure control, adjustment of the mA and/or kV according to patient size and/or use of iterative reconstruction technique. COMPARISON:  12/30/2021 CT head, 01/24/2022 MRI head; no prior cervical spine CT, correlation is made with 04/17/2013 cervical spine radiographs FINDINGS: CT HEAD FINDINGS Evaluation is somewhat limited by motion artifact. Brain: No evidence of acute infarct, hemorrhage, mass, mass effect, or midline shift. No hydrocephalus or extra-axial fluid collection. Remote left occipital infarct. Redemonstrated lacunar infarcts in the left basal ganglia, left corona radiata, and bilateral thalami. Periventricular white matter changes, likely the sequela of chronic small vessel ischemic disease. Vascular: No hyperdense vessel. Skull: Normal. Negative for fracture or focal lesion. Sinuses/Orbits: Partial opacification of the posterior left ethmoid air cells and left sphenoid sinus. Status post bilateral lens replacements. Other: The mastoid air cells are well aerated. CT CERVICAL SPINE FINDINGS Alignment: Straightening of the normal cervical lordosis. No listhesis. Skull base and vertebrae: No acute fracture. No primary bone lesion or focal pathologic process. Soft tissues and spinal canal: No prevertebral fluid or swelling. No visible canal hematoma. Disc levels: Mild degenerative changes for age. No high-grade spinal canal stenosis. Upper chest: Left apical mass, which extends into the inferior left neck, which is better evaluated on the 01/15/2022 CT chest. Moderate right and large left pleural effusions, new on the right and increased on the left compared to 01/15/2022 Other: Presumed nodal metastasis in the right neck (series 4, image 40) is poorly evaluated in the absence of intravenous contrast. Additional prominent right level 2  lymph nodes (series 4, image 53) and left level 3 lymph node (series 4, image 80). IMPRESSION: 1.  No acute intracranial process. 2.  No acute fracture or traumatic listhesis in the cervical spine. 3. Known left apical mass is incompletely evaluated. Moderate right and large left pleural effusions. 4. Presumed nodal metastases in the bilateral cervical lymph nodes. Electronically Signed   By: Merilyn Baba M.D.   On: 02/04/2022 16:19   CT Cervical Spine Wo Contrast  Result Date: 02/04/2022 CLINICAL DATA:  Generalized weakness. EXAM: CT  HEAD WITHOUT CONTRAST CT CERVICAL SPINE WITHOUT CONTRAST TECHNIQUE: Multidetector CT imaging of the head and cervical spine was performed following the standard protocol without intravenous contrast. Multiplanar CT image reconstructions of the cervical spine were also generated. RADIATION DOSE REDUCTION: This exam was performed according to the departmental dose-optimization program which includes automated exposure control, adjustment of the mA and/or kV according to patient size and/or use of iterative reconstruction technique. COMPARISON:  12/30/2021 CT head, 01/24/2022 MRI head; no prior cervical spine CT, correlation is made with 04/17/2013 cervical spine radiographs FINDINGS: CT HEAD FINDINGS Evaluation is somewhat limited by motion artifact. Brain: No evidence of acute infarct, hemorrhage, mass, mass effect, or midline shift. No hydrocephalus or extra-axial fluid collection. Remote left occipital infarct. Redemonstrated lacunar infarcts in the left basal ganglia, left corona radiata, and bilateral thalami. Periventricular white matter changes, likely the sequela of chronic small vessel ischemic disease. Vascular: No hyperdense vessel. Skull: Normal. Negative for fracture or focal lesion. Sinuses/Orbits: Partial opacification of the posterior left ethmoid air cells and left sphenoid sinus. Status post bilateral lens replacements. Other: The mastoid air cells are well  aerated. CT CERVICAL SPINE FINDINGS Alignment: Straightening of the normal cervical lordosis. No listhesis. Skull base and vertebrae: No acute fracture. No primary bone lesion or focal pathologic process. Soft tissues and spinal canal: No prevertebral fluid or swelling. No visible canal hematoma. Disc levels: Mild degenerative changes for age. No high-grade spinal canal stenosis. Upper chest: Left apical mass, which extends into the inferior left neck, which is better evaluated on the 01/15/2022 CT chest. Moderate right and large left pleural effusions, new on the right and increased on the left compared to 01/15/2022 Other: Presumed nodal metastasis in the right neck (series 4, image 40) is poorly evaluated in the absence of intravenous contrast. Additional prominent right level 2 lymph nodes (series 4, image 53) and left level 3 lymph node (series 4, image 80). IMPRESSION: 1.  No acute intracranial process. 2.  No acute fracture or traumatic listhesis in the cervical spine. 3. Known left apical mass is incompletely evaluated. Moderate right and large left pleural effusions. 4. Presumed nodal metastases in the bilateral cervical lymph nodes. Electronically Signed   By: Merilyn Baba M.D.   On: 02/04/2022 16:19   DG Chest 1 View  Result Date: 02/04/2022 CLINICAL DATA:  Syncope EXAM: CHEST  1 VIEW COMPARISON:  Chest x-ray dated January 22, 2022 FINDINGS: Visualized cardiac and mediastinal contours are within normal limits. Left suprahilar mass compatible with known malignancy. New large right pleural effusion. Evidence of pneumothorax. IMPRESSION: Left suprahilar mass with new large left pleural effusion. Electronically Signed   By: Yetta Glassman M.D.   On: 02/04/2022 15:42      Scheduled Meds:  feeding supplement  237 mL Oral BID BM   insulin aspart  0-9 Units Subcutaneous TID WC   Continuous Infusions:  sodium chloride     azithromycin     cefTRIAXone (ROCEPHIN)  IV     heparin 1,200 Units/hr  (02/05/22 1252)     LOS: 1 day     Dessa Phi, DO [facility] 02/05/2022, 1:11 PM   Available via Epic secure chat 7am-7pm After these hours, please refer to coverage provider listed on [email or web address]

## 2022-02-05 NOTE — Progress Notes (Signed)
PHARMACY - PHYSICIAN COMMUNICATION CRITICAL VALUE ALERT - BLOOD CULTURE IDENTIFICATION (BCID)  Drew Harrington is an 76 y.o. male who presented to [facility] on 02/04/2022 with a chief complaint of weakness and a fall. Patient was started on antibiotics for sepsis secondary to pneumonia.  Assessment:  1/4 blood culture bottles (aerobic) with gram negative rods. BCID did not detect any organism. Patient recently had chemo on 02/03/22. Tm 100.1  Name of physician (or Provider) Contacted: Dr. Maylene Roes  Current antibiotics: azithromycin 500mg  IV q24h and ceftriaxone 2g IV q24h  Changes to prescribed antibiotics recommended:  Given patient's immunocompromised state, recommend broadening ceftriaxone to cefepime until further culture data results.   Results for orders placed or performed during the hospital encounter of 02/04/22  Blood Culture ID Panel (Reflexed) (Collected: 02/04/2022  5:30 PM)  Result Value Ref Range   Enterococcus faecalis NOT DETECTED NOT DETECTED   Enterococcus Faecium NOT DETECTED NOT DETECTED   Listeria monocytogenes NOT DETECTED NOT DETECTED   Staphylococcus species NOT DETECTED NOT DETECTED   Staphylococcus aureus (BCID) NOT DETECTED NOT DETECTED   Staphylococcus epidermidis NOT DETECTED NOT DETECTED   Staphylococcus lugdunensis NOT DETECTED NOT DETECTED   Streptococcus species NOT DETECTED NOT DETECTED   Streptococcus agalactiae NOT DETECTED NOT DETECTED   Streptococcus pneumoniae NOT DETECTED NOT DETECTED   Streptococcus pyogenes NOT DETECTED NOT DETECTED   A.calcoaceticus-baumannii NOT DETECTED NOT DETECTED   Bacteroides fragilis NOT DETECTED NOT DETECTED   Enterobacterales NOT DETECTED NOT DETECTED   Enterobacter cloacae complex NOT DETECTED NOT DETECTED   Escherichia coli NOT DETECTED NOT DETECTED   Klebsiella aerogenes NOT DETECTED NOT DETECTED   Klebsiella oxytoca NOT DETECTED NOT DETECTED   Klebsiella pneumoniae NOT DETECTED NOT DETECTED   Proteus species NOT  DETECTED NOT DETECTED   Salmonella species NOT DETECTED NOT DETECTED   Serratia marcescens NOT DETECTED NOT DETECTED   Haemophilus influenzae NOT DETECTED NOT DETECTED   Neisseria meningitidis NOT DETECTED NOT DETECTED   Pseudomonas aeruginosa NOT DETECTED NOT DETECTED   Stenotrophomonas maltophilia NOT DETECTED NOT DETECTED   Candida albicans NOT DETECTED NOT DETECTED   Candida auris NOT DETECTED NOT DETECTED   Candida glabrata NOT DETECTED NOT DETECTED   Candida krusei NOT DETECTED NOT DETECTED   Candida parapsilosis NOT DETECTED NOT DETECTED   Candida tropicalis NOT DETECTED NOT DETECTED   Cryptococcus neoformans/gattii NOT DETECTED NOT DETECTED   Dimple Nanas, PharmD, BCPS 02/05/2022 5:24 PM

## 2022-02-05 NOTE — TOC Initial Note (Signed)
Transition of Care [facility]) - Initial/Assessment Note    Patient Details  Name: Drew Harrington MRN: [id number] Date of Birth: Feb 14, 1946  Transition of Care [facility]) CM/SW Contact:    Leeroy Cha, RN Phone Number: 02/05/2022, 7:57 AM  Clinical Narrative:                  Transition of Care [facility]) Screening Note   Patient Details  Name: Drew Harrington Date of Birth: February 21, 1946   Transition of Care [facility]) CM/SW Contact:    Leeroy Cha, RN Phone Number: 02/05/2022, 7:57 AM    Transition of Care Department [facility]) has reviewed patient and no TOC needs have been identified at this time. We will continue to monitor patient advancement through interdisciplinary progression rounds. If new patient transition needs arise, please place a TOC consult.    Expected Discharge Plan: Home/Self Care Barriers to Discharge: Continued Medical Work up   Patient Goals and CMS Choice Patient states their goals for this hospitalization and ongoing recovery are:: wife- pt has dementia but is awake- to go home CMS Medicare.gov Compare Post Acute Care list provided to:: Patient Represenative (must comment) (wife) Choice offered to / list presented to : Spouse  Expected Discharge Plan and Services Expected Discharge Plan: Home/Self Care   Discharge Planning Services: CM Consult   Living arrangements for the past 2 months: Single Family Home                                      Prior Living Arrangements/Services Living arrangements for the past 2 months: Single Family Home Lives with:: Spouse Patient language and need for interpreter reviewed:: Yes Do you feel safe going back to the place where you live?: Yes      Need for Family Participation in Patient Care: Yes (Comment) (wife) Care giver support system in place?: Yes (comment) (wife)   Criminal Activity/Legal Involvement Pertinent to Current Situation/Hospitalization: No - Comment as needed  Activities of Daily  Living Home Assistive Devices/Equipment: Environmental consultant (specify type), Oxygen, Dentures (specify type) (partial upper plate) ADL Screening (condition at time of admission) Patient's cognitive ability adequate to safely complete daily activities?: No Is the patient deaf or have difficulty hearing?: No Does the patient have difficulty seeing, even when wearing glasses/contacts?: No Does the patient have difficulty concentrating, remembering, or making decisions?: Yes Patient able to express need for assistance with ADLs?: No Does the patient have difficulty dressing or bathing?: Yes Independently performs ADLs?: No Communication: Needs assistance Is this a change from baseline?: Pre-admission baseline Dressing (OT): Needs assistance Is this a change from baseline?: Pre-admission baseline Grooming: Needs assistance Is this a change from baseline?: Pre-admission baseline Feeding: Needs assistance Is this a change from baseline?: Pre-admission baseline Bathing: Needs assistance Is this a change from baseline?: Pre-admission baseline Toileting: Needs assistance Is this a change from baseline?: Pre-admission baseline In/Out Bed: Needs assistance Is this a change from baseline?: Pre-admission baseline Walks in Home: Needs assistance Is this a change from baseline?: Pre-admission baseline Does the patient have difficulty walking or climbing stairs?: Yes Weakness of Legs: Both Weakness of Arms/Hands: Both  Permission Sought/Granted                  Emotional Assessment Appearance:: Appears stated age Attitude/Demeanor/Rapport: Engaged Affect (typically observed): Calm Orientation: : Oriented to Self, Oriented to Situation Alcohol / Substance Use: Tobacco Use (tobacco smoke  and chew in history quit but end not known) Psych Involvement: No (comment)  Admission diagnosis:  Pleural effusion [J90] Patient Active Problem List   Diagnosis Date Noted   Pleural effusion 02/04/2022   History  of pulmonary embolus (PE) 02/04/2022   CAP (community acquired pneumonia) 02/04/2022   Acute kidney injury superimposed on chronic kidney disease ([place]) 02/04/2022   Sepsis ([place]) 27/08/5007   Acute metabolic encephalopathy 38/18/2993   Adenocarcinoma of left lung, stage 4 ([place]) 01/28/2022   Encounter for antineoplastic chemotherapy 01/28/2022   Encounter for antineoplastic immunotherapy 01/28/2022   Right leg DVT ([place]) 01/16/2022   Acute pulmonary embolism with acute cor pulmonale ([place]) 01/15/2022   Hemoptysis 01/15/2022   Chest pain 01/15/2022   Stage 3a chronic kidney disease (CKD) ([place]) 01/15/2022   Mass of left lung 01/11/2022   Mass of upper lobe of left lung 01/06/2022   Memory change 12/25/2021   Cough 12/25/2021   Weakness of right side of body 12/25/2021   Shoulder pain 07/06/2019   Tremor, essential 02/27/2014   Routine health maintenance 01/11/2013   History of colonic polyps 04/10/2010   CATARACT, SENILE, BILATERAL 06/24/2009   HLD (hyperlipidemia) 11/17/2007   DM2 (diabetes mellitus, type 2) ([place]) 11/17/2007   IMPOTENCE 11/16/2007   HTN (hypertension) 11/16/2007   HEMORRHOIDS 11/16/2007   PCP:  Hoyt Koch, MD Pharmacy:   [facility] [phone] [address], [address] AT [facility] [address] [place] [address] Phone: [phone] Fax: [phone]     Social Determinants of Health (SDOH) Interventions    Readmission Risk Interventions     No data to display

## 2022-02-06 ENCOUNTER — Inpatient Hospital Stay (HOSPITAL_COMMUNITY): Payer: Medicare Other

## 2022-02-06 DIAGNOSIS — G9340 Encephalopathy, unspecified: Secondary | ICD-10-CM | POA: Diagnosis not present

## 2022-02-06 DIAGNOSIS — R6520 Severe sepsis without septic shock: Secondary | ICD-10-CM | POA: Diagnosis not present

## 2022-02-06 DIAGNOSIS — A419 Sepsis, unspecified organism: Secondary | ICD-10-CM | POA: Diagnosis not present

## 2022-02-06 LAB — BASIC METABOLIC PANEL
Anion gap: 7 (ref 5–15)
BUN: 39 mg/dL — ABNORMAL HIGH (ref 8–23)
CO2: 22 mmol/L (ref 22–32)
Calcium: 8.2 mg/dL — ABNORMAL LOW (ref 8.9–10.3)
Chloride: 115 mmol/L — ABNORMAL HIGH (ref 98–111)
Creatinine, Ser: 1.87 mg/dL — ABNORMAL HIGH (ref 0.61–1.24)
GFR, Estimated: 37 mL/min — ABNORMAL LOW (ref >60)
Glucose, Bld: 93 mg/dL (ref 70–99)
Potassium: 4.2 mmol/L (ref 3.5–5.1)
Sodium: 144 mmol/L (ref 135–145)

## 2022-02-06 LAB — CBC
HCT: 43.7 % (ref 39.0–52.0)
Hemoglobin: 13.3 g/dL (ref 13.0–17.0)
MCH: 31 pg (ref 26.0–34.0)
MCHC: 30.4 g/dL (ref 30.0–36.0)
MCV: 101.9 fL — ABNORMAL HIGH (ref 80.0–100.0)
Platelets: 187 10*3/uL (ref 150–400)
RBC: 4.29 MIL/uL (ref 4.22–5.81)
RDW: 16.9 % — ABNORMAL HIGH (ref 11.5–15.5)
WBC: 6.4 10*3/uL (ref 4.0–10.5)
nRBC: 0 % (ref 0.0–0.2)

## 2022-02-06 LAB — GLUCOSE, CAPILLARY
Glucose-Capillary: 93 mg/dL (ref 70–99)
Glucose-Capillary: 129 mg/dL — ABNORMAL HIGH (ref 70–99)
Glucose-Capillary: 81 mg/dL (ref 70–99)
Glucose-Capillary: 94 mg/dL (ref 70–99)

## 2022-02-06 NOTE — Evaluation (Signed)
Occupational Therapy Evaluation Patient Details Name: Drew Harrington MRN: [id number] DOB: October 22, 1945 Today's Date: 02/06/2022   History of Present Illness Pt is 76 yo male who presents with diarrhea  and weakness. PMH includes stage IVB non-small cell lung cancer, adenocarcinoma with large apical LUL mass, with mediastinal, right cervical and bilateral adrenal metastases, PE involving large right main on Lovenox (dx 12/2021), HTN, dementia, CKD 3a. He was also found to have large left pleural effusion, s/p left thoracentesis on 8/11.   Clinical Impression   Pt admitted with the above diagnoses and presents with below problem list. Pt will benefit from continued acute OT to address the below listed deficits and maximize independence with basic ADLs prior to d/c home. PTA, pt lives with spouse and reports he needs no assistance with ADLs. Pt presents with decreased activity tolerance. Currently requires up to min guard assist with LB/OOB ADLs.        Recommendations for follow up therapy are one component of a multi-disciplinary discharge planning process, led by the attending physician.  Recommendations may be updated based on patient status, additional functional criteria and insurance authorization.   Follow Up Recommendations  No OT follow up    Assistance Recommended at Discharge Intermittent Supervision/Assistance  Patient can return home with the following A little help with walking and/or transfers;A little help with bathing/dressing/bathroom;Direct supervision/assist for medications management;Direct supervision/assist for financial management;Assist for transportation    Functional Status Assessment  Patient has had a recent decline in their functional status and demonstrates the ability to make significant improvements in function in a reasonable and predictable amount of time.  Equipment Recommendations  None recommended by OT    Recommendations for Other Services PT  consult     Precautions / Restrictions Precautions Precautions: Fall Restrictions Weight Bearing Restrictions: No      Mobility Bed Mobility Overal bed mobility: Modified Independent                  Transfers Overall transfer level: Needs assistance Equipment used: None Transfers: Sit to/from Stand Sit to Stand: Supervision           General transfer comment: to/from EOB and recliner      Balance Overall balance assessment: Mild deficits observed, not formally tested                                         ADL either performed or assessed with clinical judgement   ADL Overall ADL's : Needs assistance/impaired Eating/Feeding: Set up   Grooming: Set up;Min guard;Sitting;Standing   Upper Body Bathing: Set up;Sitting   Lower Body Bathing: Min guard;Sit to/from stand   Upper Body Dressing : Set up;Sitting   Lower Body Dressing: Min guard;Sit to/from stand   Toilet Transfer: Min guard;Ambulation   Toileting- Clothing Manipulation and Hygiene: Min guard;Sit to/from stand   Tub/ Shower Transfer: Min guard;Ambulation   Functional mobility during ADLs: Min guard General ADL Comments: Pt up to recliner this session.     Vision         Perception     Praxis      Pertinent Vitals/Pain Pain Assessment Pain Assessment: No/denies pain     Hand Dominance     Extremity/Trunk Assessment Upper Extremity Assessment Upper Extremity Assessment: Generalized weakness;Overall [facility] for tasks assessed   Lower Extremity Assessment Lower Extremity Assessment: Defer to PT evaluation  Communication Communication Communication: No difficulties   Cognition Arousal/Alertness: Awake/alert Behavior During Therapy: Flat affect, WFL for tasks assessed/performed Overall Cognitive Status: History of cognitive impairments - at baseline                                 General Comments: impaired STM, able to follow one step  commands consistently. pleasant demeanor.     General Comments  On 3L O2 throughout session.    Exercises     Shoulder Instructions      Home Living Family/patient expects to be discharged to:: Private residence Living Arrangements: Spouse/significant other Available Help at Discharge: Family Type of Home: House Home Access: Stairs to enter [employer] of Steps: 3 Entrance Stairs-Rails: Right;Left Home Layout: Bed/bath upstairs;Two level     Bathroom Shower/Tub: Teacher, early years/pre: Standard     Home Equipment: None          Prior Functioning/Environment Prior Level of Function : Independent/Modified Independent               ADLs Comments: Independent per pt report        OT Problem List: Decreased strength;Decreased activity tolerance;Impaired balance (sitting and/or standing);Decreased cognition;Decreased knowledge of use of DME or AE;Decreased knowledge of precautions      OT Treatment/Interventions: Self-care/ADL training;Energy conservation;DME and/or AE instruction;Therapeutic activities;Patient/family education;Balance training    OT Goals(Current goals can be found in the care plan section) Acute Rehab OT Goals Patient Stated Goal: not stated OT Goal Formulation: With patient Time For Goal Achievement: 02/20/22 Potential to Achieve Goals: Good ADL Goals Pt Will Perform Grooming: with supervision;standing Pt Will Transfer to Toilet: with supervision;ambulating Pt Will Perform Toileting - Clothing Manipulation and hygiene: with supervision;sit to/from stand Pt Will Perform Tub/Shower Transfer: with supervision;ambulating  OT Frequency: Min 2X/week    Co-evaluation              AM-PAC OT "6 Clicks" Daily Activity     Outcome Measure Help from another person eating meals?: None Help from another person taking care of personal grooming?: None Help from another person toileting, which includes using toliet, bedpan,  or urinal?: A Little Help from another person bathing (including washing, rinsing, drying)?: A Little Help from another person to put on and taking off regular upper body clothing?: None Help from another person to put on and taking off regular lower body clothing?: None 6 Click Score: 22   End of Session Equipment Utilized During Treatment: Oxygen  Activity Tolerance: Patient tolerated treatment well Patient left: in chair;with call bell/phone within reach;with chair alarm set  OT Visit Diagnosis: Unsteadiness on feet (R26.81);Muscle weakness (generalized) (M62.81)                Time: 9381-8299 OT Time Calculation (min): 13 min Charges:  OT General Charges $OT Visit: 1 Visit OT Evaluation $OT Eval Low Complexity: [place], OT Acute Rehabilitation Services Office: [phone]   Hortencia Pilar 02/06/2022, 11:08 AM

## 2022-02-06 NOTE — Evaluation (Signed)
Physical Therapy Evaluation Patient Details Name: Drew Harrington MRN: [id number] DOB: 11-Mar-1946 Today's Date: 02/06/2022  History of Present Illness  Pt is 76 yo male who presents with diarrhea  and weakness. PMH includes stage IVB non-small cell lung cancer, adenocarcinoma with large apical LUL mass, with mediastinal, right cervical and bilateral adrenal metastases, PE involving large right main on Lovenox (dx 12/2021), HTN, dementia, CKD 3a. He was also found to have large left pleural effusion, s/p left thoracentesis on 8/11.  Clinical Impression  On eval, pt required Min guard A for mobility. He walked ~15 feet around the room with a RW. O2 was  90% on RA during session. Pt participated fairly well with time and instruction. Will recommend HHPT f/u at this time as long as wife feels she can care for him at home. Will plan to follow pt during this hospital stay.        Recommendations for follow up therapy are one component of a multi-disciplinary discharge planning process, led by the attending physician.  Recommendations may be updated based on patient status, additional functional criteria and insurance authorization.  Follow Up Recommendations Home health PT      Assistance Recommended at Discharge Frequent or constant Supervision/Assistance  Patient can return home with the following  A little help with walking and/or transfers;A little help with bathing/dressing/bathroom;Assistance with cooking/housework;Assist for transportation;Help with stairs or ramp for entrance;Direct supervision/assist for financial management    Equipment Recommendations None recommended by PT  Recommendations for Other Services       Functional Status Assessment Patient has had a recent decline in their functional status and demonstrates the ability to make significant improvements in function in a reasonable and predictable amount of time.     Precautions / Restrictions Precautions Precautions:  Fall Precaution Comments: monitor O2 Restrictions Weight Bearing Restrictions: No      Mobility  Bed Mobility Overal bed mobility: Needs Assistance Bed Mobility: Supine to Sit, Sit to Supine     Supine to sit: Supervision Sit to supine: Supervision   General bed mobility comments: Supv for safety, lines. Increased time. Cues required.    Transfers Overall transfer level: Needs assistance Equipment used: Rolling walker (2 wheels) Transfers: Sit to/from Stand Sit to Stand: Supervision           General transfer comment: Cues for safety. Increased time    Ambulation/Gait Ambulation/Gait assistance: Min guard Gait Distance (Feet): 15 Feet Assistive device: Rolling walker (2 wheels) Gait Pattern/deviations: Step-through pattern, Decreased stride length       General Gait Details: Pt walked around the room with a RW. No LOB. O2 90% on RA  Stairs            Wheelchair Mobility    Modified Rankin (Stroke Patients Only)       Balance Overall balance assessment: Needs assistance         Standing balance support: During functional activity, Reliant on assistive device for balance Standing balance-Leahy Scale: Fair                               Pertinent Vitals/Pain Pain Assessment Pain Assessment: No/denies pain    Home Living Family/patient expects to be discharged to:: Private residence Living Arrangements: Spouse/significant other Available Help at Discharge: Family Type of Home: House Home Access: Stairs to enter Entrance Stairs-Rails: Psychiatric nurse of Steps: 3 Alternate Level Stairs-Number of Steps: 7 Home Layout: Bed/bath  upstairs;Two level Home Equipment: None      Prior Function Prior Level of Function : Independent/Modified Independent               ADLs Comments: Independent per pt report     Hand Dominance        Extremity/Trunk Assessment   Upper Extremity Assessment Upper Extremity  Assessment: Defer to OT evaluation    Lower Extremity Assessment Lower Extremity Assessment: Generalized weakness    Cervical / Trunk Assessment Cervical / Trunk Assessment: Normal  Communication   Communication: No difficulties  Cognition Arousal/Alertness: Awake/alert Behavior During Therapy: Flat affect Overall Cognitive Status: History of cognitive impairments - at baseline                                 General Comments: impaired STM, able to follow one step commands consistently. pleasant demeanor.        General Comments      Exercises     Assessment/Plan    PT Assessment Patient needs continued PT services  PT Problem List Decreased strength;Decreased balance;Decreased activity tolerance;Decreased mobility;Decreased knowledge of use of DME       PT Treatment Interventions DME instruction;Gait training;Functional mobility training;Therapeutic activities;Balance training;Patient/family education;Therapeutic exercise    PT Goals (Current goals can be found in the Care Plan section)  Acute Rehab PT Goals Patient Stated Goal: to sleep PT Goal Formulation: With patient Time For Goal Achievement: 02/20/22 Potential to Achieve Goals: Good    Frequency Min 3X/week     Co-evaluation               AM-PAC PT "6 Clicks" Mobility  Outcome Measure Help needed turning from your back to your side while in a flat bed without using bedrails?: A Little Help needed moving from lying on your back to sitting on the side of a flat bed without using bedrails?: A Little Help needed moving to and from a bed to a chair (including a wheelchair)?: A Little Help needed standing up from a chair using your arms (e.g., wheelchair or bedside chair)?: A Little Help needed to walk in hospital room?: A Little Help needed climbing 3-5 steps with a railing? : A Little 6 Click Score: 18    End of Session Equipment Utilized During Treatment: Gait belt;Oxygen Activity  Tolerance: Patient tolerated treatment well Patient left: in bed;with call bell/phone within reach;with bed alarm set   PT Visit Diagnosis: Unsteadiness on feet (R26.81);Muscle weakness (generalized) (M62.81)    Time: 1345-1400 PT Time Calculation (min) (ACUTE ONLY): 15 min   Charges:   PT Evaluation $PT Eval Moderate Complexity: [place], PT Acute Rehabilitation  Office: [phone] Pager: [phone]

## 2022-02-06 NOTE — TOC Progression Note (Addendum)
Transition of Care [facility]) - Progression Note    Patient Details  Name: Drew Harrington MRN: [id number] Date of Birth: July 27, 1945  Transition [facility] CM/SW Contact  Ross Ludwig, [place] Phone Number: 02/06/2022, 4:22 PM  Clinical Narrative:     CSW was informed that patient's wife is requesting a hospital bed, bedside commode, over the bed tray, and a shower chair.  CSW called [employer], they will work on coordinating equipment delivery with patient's wife [place] [phone].  Per Delana Meyer at [facility] they will call patient's wife [name]  and work on scheduling a time for delivery.  Patient will also need [place] set up, TOC to work on getting [facility] set up.  CSW updated [name] that someone from [place] will reach out to her.  CSW explained that patient will have to have [place] PT, and OT in order to get an aide.  CSW also informed her it will not be everyday, and she is aware of that.  TOC to continue to follow patient's progress throughout discharge planning.   Expected Discharge Plan: Home/Self Care Barriers to Discharge: Continued Medical Work up  Expected Discharge Plan and Services Expected Discharge Plan: Home/Self Care   Discharge Planning Services: CM Consult   Living arrangements for the past 2 months: Single Family Home                                       Social Determinants of Health (SDOH) Interventions    Readmission Risk Interventions     No data to display

## 2022-02-06 NOTE — Consult Note (Signed)
Palliative Care Consult Note                                  Date: 02/06/2022   Patient Name: Drew Harrington  DOB: March 31, 1946  MRN: [id number]  Age / Sex: 76 y.o., male  PCP: Drew Koch, MD Referring Physician: Dessa Phi, DO  Reason for Consultation: Establishing goals of care " stage IV lung cancer"  HPI/Patient Profile: 76 y.o. male  with past medical history of stage IV non-small cell lung cancer, adenocarcinoma with large apical LUL mass, PE involving large right main on Lovenox (diagnosed July 2023), hypertension, dementia, and CKD stage 3a.he presented to [facility] ED on 02/04/2022 with weakness and diarrhea.  Patient just had first session of chemotherapy on 8/9 and then started to have diarrhea.  He slipped in the bathtub while trying to wash up and subsequently was brought to the ED.  In the ED, he was febrile, tachycardic, and tachypneic.  Creatinine elevated.  CT head and cervical spine negative for injuries.  Imaging showed a large left pleural effusion.  Admitted to [facility] service.  Palliative Medicine consulted for goals of care.  He underwent thoracentesis earlier today 8/11.   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  He is calm and comfortable.  He has no acute complaints.  I met with his wife [name] in the waiting  room to discuss diagnosis, prognosis, GOC, EOL wishes, disposition, and options.  I introduced Palliative Medicine as specialized medical care for people living with serious illness. It focuses on providing relief from the symptoms and stress of a serious illness.   Created space and opportunity for family to explore thoughts and feelings regarding current medical situation. Values and goals of care important to patient and family were attempted to be elicited.  A discussion was had today regarding advanced directives. Concepts specific to code status, artifical feeding and hydration, continued IV antibiotics and rehospitalization was had.  The MOST form was introduced and discussed.  Questions and concerns addressed. Family encouraged to call with questions or concerns.    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  Tanner and [place] are both originally from [place]; however they met in [place] on a blind date.  They have been married for 47 years.  They have 1 son together, Harrell Gave who lives in [place].  [name] also has 2 children from a previous relationship - a daughter in [place] and a son in [place]. Result describes [name] as "stubborn" and "wants things his way or no way".  She tells me his hobbies were drinking and smoking.  Functional Status: It seems that [name]'s functional status has been somewhat decreased over the past few weeks. However, he has  been resistant to accept help with care.   Discussion: We discussed patient's current illness and what it means in the larger context of his ongoing co-morbidities. Current clinical status was reviewed. Natural disease trajectory of advanced cancer was discussed.  [name] clearly understands that stage IV lung cancer is a noncurable illness.  She understands there is metastasis to multiple areas, including right cervical, bilateral adrenal, peritoneal, and skeletal (left pubic ramus).  [name]  shares that she did not want [name] to undergo chemotherapy in the first place, as she was concerned he was not strong enough.  At this point, she does not feel that [name] will be able to tolerate additional chemotherapy. Her goal for [name] is that he can be comfortable at home without undergoing additional treatment. She is interested in home hospice services. Provided education on the philosophy and benefits of hospice care. Discussed that it offers a holistic approach to care in the setting of end-stage illness/disease, and is about supporting the patient where they are while allowing the natural course to occur.  Discussed the hospice team can provide personal care, support for the family, and help keep patient out of the hospital.  [facility] plans to follow up with oncology when [name] is discharged from the hospital. Discussed the importance of continued conversation with patient, family and their medical providers regarding overall plan of care and treatment options, ensuring decisions are within the context of the patients values and GOCs.    Review of Systems  All other systems reviewed and are negative.   Objective:   Primary Diagnoses: Present on Admission:  Pleural effusion  HTN (hypertension)  Adenocarcinoma of left lung, stage 4 (HCC)   Physical Exam Constitutional:      General: He is not in acute distress.    Comments: Chronically ill-appearing  Pulmonary:     Effort: Pulmonary effort is normal.  Neurological:     Mental Status: He is alert.  Psychiatric:        Cognition and Memory: Cognition is impaired.    Vital Signs:  BP 124/75 (BP Location: Right Arm)   Pulse 92   Temp 98.9 F (37.2 C) (Oral)   Resp 18   Ht _0  (1.753 m)   Wt 78.3 kg   SpO2 93%   BMI 25.49 kg/m   Palliative Assessment/Data: PPS 40%     Assessment & Plan:   SUMMARY OF RECOMMENDATIONS   DNR/DNI as previously documented Continue current interventions Wife does not think  patient can tolerate additional chemotherapy - she is interested in home hospice Referral to Palliative Medicine at [place] PMT will continue to follow  Primary Decision Maker: Unclear if patient has capacity to make complex medical decisions in the setting of documented cognitive impairment/dementia.  His wife should be involved in all decision-making.  Prognosis:  Unable to determine  Discharge Planning:  To Be Determined     Thank you for allowing [place] to participate in the care of [place]  Signed by: Elie Confer, NP Palliative Medicine Team  Team Phone # [phone]  For individual providers, please see AMION

## 2022-02-06 NOTE — Progress Notes (Signed)
   02/06/22 1815  Urine Characteristics  Urinary Interventions Bladder scan  Bladder Scan Volume (mL) 309 mL  Intermittent/Straight Cath (mL) 340 mL   Patient bladder scanned by RN, and showed >309 cc. MD made aware. MD placed order for in & out catheterization. Intermittent catheterization emptied 340 cc from the bladder. MD made aware.  Layla Maw, RN

## 2022-02-06 NOTE — Progress Notes (Signed)
PROGRESS NOTE    Drew Harrington  GHW:[id number] DOB: 29-Nov-1945 DOA: 02/04/2022 PCP: Hoyt Koch, MD     Brief Narrative:  Drew Harrington is a 76 y.o. male with medical history significant of stage IVB non-small cell lung cancer, adenocarcinoma with large apical LUL mass, with mediastinal, right cervical and bilateral adrenal metastases, PE involving large right main on Lovenox (dx 12/2021), HTN, dementia, CKD 3a who presents with diarrhea  and weakness.  Patient just received first session of chemotherapy on 8/9 and then started to have diarrhea.  He slipped in the bathtub while trying to wash out and subsequently was sent to ED.  In the ED, patient had a fever, was tachycardic and tachypneic.  With elevated creatinine.  CT head and cervical spine were negative for injuries.  He was also found to have large left pleural effusion.  His Lovenox was switched to IV heparin in anticipation for thoracentesis.  Patient underwent thoracentesis 8/11 which removed 1.1 L fluid.  New events last 24 hours / Subjective: Patient without any physical complaints today.  Denies shortness of breath  Assessment & Plan:   Principal Problem:   Sepsis ([facility]) Active Problems:   HTN (hypertension)   DM2 (diabetes mellitus, type 2) (HCC)   Adenocarcinoma of left lung, stage 4 (HCC)   Pleural effusion   History of pulmonary embolus (PE)   CAP (community acquired pneumonia)   Acute kidney injury superimposed on chronic kidney disease ([place])   Acute metabolic encephalopathy   Severe sepsis secondary to pneumonia -Sepsis present on admission with fever, tachycardia, tachypnea with endorgan damage including AKI and respiratory failure -Was found a new 1 out of 4 bottles and BCID was negative, ?contaminant -Repeat blood cultures today  -Cefepime, azithromycin  Large left pleural effusion -Status post thoracentesis 8/11 which revealed 1.1 L fluid.  Fluid is exudative in nature  AKI on CKD 3a   -Baseline creatinine 1.4 -Continue IV fluid  Acute metabolic encephalopathy -Insetting of illnesses as above.  Resolved  History of PE -Lovenox  Stage IV adenocarcinoma left lung -Followed by Dr. Julien Nordmann -Currently undergoing systemic chemotherapy -Palliative care consulted, planning for home hospice on discharge  Diabetes mellitus type 2 -a1c 6.3 -Sliding scale insulin  Hypertension -Losartan/HCTZ on hold due to AKI  DVT prophylaxis: IV heparin  Code Status: DNR Family Communication: Wife over the phone 8/11 Disposition Plan:  Status is: Inpatient Remains inpatient appropriate because: IV antibiotics, IV fluid, repeat blood cultures   Antimicrobials:  Anti-infectives (From admission, onward)    Start     Dose/Rate Route Frequency Ordered Stop   02/05/22 2200  ceFEPIme (MAXIPIME) 2 g in sodium chloride 0.9 % 100 mL IVPB        2 g 200 mL/hr over 30 Minutes Intravenous Every 12 hours 02/05/22 1738     02/05/22 1800  cefTRIAXone (ROCEPHIN) 2 g in sodium chloride 0.9 % 100 mL IVPB  Status:  Discontinued        2 g 200 mL/hr over 30 Minutes Intravenous Every 24 hours 02/04/22 2026 02/05/22 1738   02/05/22 1800  azithromycin (ZITHROMAX) 500 mg in sodium chloride 0.9 % 250 mL IVPB        500 mg 250 mL/hr over 60 Minutes Intravenous Every 24 hours 02/04/22 2026 02/10/22 1759   02/04/22 1800  cefTRIAXone (ROCEPHIN) 1 g in sodium chloride 0.9 % 100 mL IVPB        1 g 200 mL/hr over 30 Minutes Intravenous  Once 02/04/22 1757 02/04/22 2020   02/04/22 1800  azithromycin (ZITHROMAX) 500 mg in sodium chloride 0.9 % 250 mL IVPB        500 mg 250 mL/hr over 60 Minutes Intravenous  Once 02/04/22 1757 02/04/22 2146        Objective: Vitals:   02/05/22 1346 02/05/22 1800 02/05/22 2054 02/06/22 0453  BP: 111/67  111/64 124/75  Pulse:   95 92  Resp:   18 18  Temp:   98.6 F (37 C) 98.9 F (37.2 C)  TempSrc:   Oral Oral  SpO2:  92% 98% 93%  Weight:      Height:         Intake/Output Summary (Last 24 hours) at 02/06/2022 1257 Last data filed at 02/06/2022 1052 Gross per 24 hour  Intake 1470.11 ml  Output 475 ml  Net 995.11 ml    Filed Weights   02/04/22 2050  Weight: 78.3 kg    Examination:  General exam: Appears calm and comfortable  Respiratory system: Clear to auscultation bilaterally, without respiratory distress, on nasal cannula O2 Cardiovascular system: S1 & S2 heard, RRR. No murmurs.  Gastrointestinal system: Abdomen is nondistended, soft and nontender. Normal bowel sounds heard. Central nervous system: Alert Extremities: Symmetric in appearance  Skin: No rashes, lesions or ulcers on exposed skin    Data Reviewed: I have personally reviewed following labs and imaging studies  CBC: Recent Labs  Lab 02/03/22 1327 02/04/22 1510 02/05/22 0944 02/06/22 0355  WBC 9.1 9.5 10.8* 6.4  NEUTROABS 7.4 8.1*  --   --   HGB 15.3 15.5 14.4 13.3  HCT 44.5 49.0 45.4 43.7  MCV 93.5 97.2 98.9 101.9*  PLT 290 286 224 737    Basic Metabolic Panel: Recent Labs  Lab 02/03/22 1350 02/04/22 1622 02/05/22 0944 02/06/22 0355  NA 143 145 143 144  K 4.2 4.4 4.4 4.2  CL 107 110 110 115*  CO2 25 24 24 22   GLUCOSE 115* 130* 122* 93  BUN 18 28* 33* 39*  CREATININE 1.68* 1.96* 1.88* 1.87*  CALCIUM 9.6 9.0 8.5* 8.2*  MG  --  2.3  --   --   PHOS  --  3.7  --   --     GFR: Estimated Creatinine Clearance: 33.6 mL/min (A) (by C-G formula based on SCr of 1.87 mg/dL (H)). Liver Function Tests: Recent Labs  Lab 02/03/22 1350 02/04/22 1622 02/05/22 0944  AST 28 41 47*  ALT 27 32 36  ALKPHOS 127* 122 100  BILITOT 0.6 0.4 0.5  PROT 7.1 6.8 6.1*  ALBUMIN 3.3* 3.0* 2.6*    Recent Labs  Lab 02/04/22 1622  LIPASE 25    No results for input(s): "AMMONIA" in the last 168 hours. Coagulation Profile: Recent Labs  Lab 02/04/22 1510  INR 1.7*    Cardiac Enzymes: No results for input(s): "CKTOTAL", "CKMB", "CKMBINDEX", "TROPONINI" in the  last 168 hours. BNP (last 3 results) No results for input(s): "PROBNP" in the last 8760 hours. HbA1C: No results for input(s): "HGBA1C" in the last 72 hours. CBG: Recent Labs  Lab 02/04/22 1526 02/05/22 1648 02/05/22 2056 02/06/22 0810 02/06/22 1118  GLUCAP 114* 140* 89 93 129*    Lipid Profile: No results for input(s): "CHOL", "HDL", "LDLCALC", "TRIG", "CHOLHDL", "LDLDIRECT" in the last 72 hours. Thyroid Function Tests: No results for input(s): "TSH", "T4TOTAL", "FREET4", "T3FREE", "THYROIDAB" in the last 72 hours. Anemia Panel: No results for input(s): "VITAMINB12", "FOLATE", "FERRITIN", "TIBC", "IRON", "RETICCTPCT"  in the last 72 hours. Sepsis Labs: Recent Labs  Lab 02/04/22 1511 02/04/22 1711  LATICACIDVEN 2.8* 1.9     Recent Results (from the past 240 hour(s))  Culture, blood (routine x 2)     Status: None (Preliminary result)   Collection Time: 02/04/22  5:30 PM   Specimen: BLOOD  Result Value Ref Range Status   Specimen Description   Final    BLOOD LEFT ANTECUBITAL Performed at [facility] [address], [place], [place] [address]    Special Requests   Final    BOTTLES DRAWN AEROBIC AND ANAEROBIC Blood Culture adequate volume Performed at [facility] [address], [place], [place] [address]    Culture  Setup Time   Final    GRAM NEGATIVE RODS AEROBIC BOTTLE ONLY CRITICAL RESULT CALLED TO, READ BACK BY AND VERIFIED WITH: [place] ON 02/05/22 1723 BY DRT Performed at [facility], [place] [address] [place], [place] [address]    Culture GRAM NEGATIVE RODS  Final   Report Status PENDING  Incomplete  Blood Culture ID Panel (Reflexed)     Status: None   Collection Time: 02/04/22  5:30 PM  Result Value Ref Range Status   Enterococcus faecalis NOT DETECTED NOT DETECTED Final   Enterococcus Faecium NOT DETECTED NOT DETECTED Final   Listeria monocytogenes NOT DETECTED NOT DETECTED Final   Staphylococcus  species NOT DETECTED NOT DETECTED Final   Staphylococcus aureus (BCID) NOT DETECTED NOT DETECTED Final   Staphylococcus epidermidis NOT DETECTED NOT DETECTED Final   Staphylococcus lugdunensis NOT DETECTED NOT DETECTED Final   Streptococcus species NOT DETECTED NOT DETECTED Final   Streptococcus agalactiae NOT DETECTED NOT DETECTED Final   Streptococcus pneumoniae NOT DETECTED NOT DETECTED Final   Streptococcus pyogenes NOT DETECTED NOT DETECTED Final   A.calcoaceticus-baumannii NOT DETECTED NOT DETECTED Final   Bacteroides fragilis NOT DETECTED NOT DETECTED Final   Enterobacterales NOT DETECTED NOT DETECTED Final   Enterobacter cloacae complex NOT DETECTED NOT DETECTED Final   Escherichia coli NOT DETECTED NOT DETECTED Final   Klebsiella aerogenes NOT DETECTED NOT DETECTED Final   Klebsiella oxytoca NOT DETECTED NOT DETECTED Final   Klebsiella pneumoniae NOT DETECTED NOT DETECTED Final   Proteus species NOT DETECTED NOT DETECTED Final   Salmonella species NOT DETECTED NOT DETECTED Final   Serratia marcescens NOT DETECTED NOT DETECTED Final   Haemophilus influenzae NOT DETECTED NOT DETECTED Final   Neisseria meningitidis NOT DETECTED NOT DETECTED Final   Pseudomonas aeruginosa NOT DETECTED NOT DETECTED Final   Stenotrophomonas maltophilia NOT DETECTED NOT DETECTED Final   Candida albicans NOT DETECTED NOT DETECTED Final   Candida auris NOT DETECTED NOT DETECTED Final   Candida glabrata NOT DETECTED NOT DETECTED Final   Candida krusei NOT DETECTED NOT DETECTED Final   Candida parapsilosis NOT DETECTED NOT DETECTED Final   Candida tropicalis NOT DETECTED NOT DETECTED Final   Cryptococcus neoformans/gattii NOT DETECTED NOT DETECTED Final    Comment: Performed at [facility], [address], [place], [place] [address]  Culture, blood (routine x 2)     Status: None (Preliminary result)   Collection Time: 02/04/22  5:58 PM   Specimen: BLOOD  Result Value Ref Range Status    Specimen Description   Final    BLOOD BLOOD LEFT ARM Performed at [facility] [address] [place],  [address]    Special Requests   Final    BOTTLES  DRAWN AEROBIC AND ANAEROBIC Blood Culture adequate volume Performed at [facility] [address], [place], [place] [address]    Culture   Final    NO GROWTH 2 DAYS Performed at [facility] [address], [place], [place] [address]    Report Status PENDING  Incomplete  Culture, body fluid w Gram Stain-bottle     Status: None (Preliminary result)   Collection Time: 02/05/22  1:46 PM   Specimen: Fluid  Result Value Ref Range Status   Specimen Description FLUID PLEURAL  Final   Special Requests BOTTLES DRAWN AEROBIC AND ANAEROBIC  Final   Culture   Final    NO GROWTH < 12 HOURS Performed at [facility], [place] [address] [place], [place] [address]    Report Status PENDING  Incomplete  Gram stain     Status: None   Collection Time: 02/05/22  1:46 PM   Specimen: BLOOD  Result Value Ref Range Status   Specimen Description BLOOD PLEURAL  Final   Special Requests NONE  Final   Gram Stain   Final    CYTOSPIN SMEAR NO ORGANISMS SEEN NO WBC SEEN Performed at [facility], [place] [address] [place], [place] [address]    Report Status 02/05/2022 FINAL  Final      Radiology Studies: DG Chest Port 1 View  Result Date: 02/05/2022 CLINICAL DATA:  Left thoracentesis EXAM: PORTABLE CHEST 1 VIEW COMPARISON:  Radiograph 02/04/2022 FINDINGS: Significantly decreased left pleural effusion, now small to moderate size after thoracentesis. No evidence of pneumothorax. Unchanged cardiomediastinal silhouette. Unchanged large left upper lobe mass. IMPRESSION: No evidence of pneumothorax after thoracentesis. Decreased size of the left pleural effusion. Electronically Signed   By: Maurine Simmering M.D.   On: 02/05/2022 14:05   US THORACENTESIS ASP PLEURAL SPACE W/IMG GUIDE  Result Date:  02/05/2022 INDICATION: Patient with history of metastatic lung cancer with large left pleural effusion. For diagnostic and therapeutic left thoracentesis. EXAM: ULTRASOUND GUIDED DIAGNOSTIC AND THERAPEUTIC LEFT THORACENTESIS MEDICATIONS: 10 mL 1 % lidocaine COMPLICATIONS: None immediate. PROCEDURE: An ultrasound guided thoracentesis was thoroughly discussed with the patient and questions answered. The benefits, risks, alternatives and complications were also discussed. The patient understands and wishes to proceed with the procedure. Written consent was obtained by patient's wife. Ultrasound was performed to localize and mark an adequate pocket of fluid in the left chest. The area was then prepped and draped in the normal sterile fashion. 1% Lidocaine was used for local anesthesia. Under ultrasound guidance a 6 Fr Safe-T-Centesis catheter was introduced. Thoracentesis was performed. The catheter was removed and a dressing applied. FINDINGS: A total of approximately 1.1 L of clear, amber fluid was removed. Samples were sent to the laboratory as requested by the clinical team. IMPRESSION: Successful ultrasound guided left thoracentesis yielding 1.1 L of pleural fluid. Read by: Narda Rutherford, AGNP-BC Electronically Signed   By: Corrie Mckusick D.O.   On: 02/05/2022 14:01   CT Head Wo Contrast  Result Date: 02/04/2022 CLINICAL DATA:  Generalized weakness. EXAM: CT HEAD WITHOUT CONTRAST CT CERVICAL SPINE WITHOUT CONTRAST TECHNIQUE: Multidetector CT imaging of the head and cervical spine was performed following the standard protocol without intravenous contrast. Multiplanar CT image reconstructions of the cervical spine were also generated. RADIATION DOSE REDUCTION: This exam was performed according to the departmental dose-optimization program which includes automated exposure control, adjustment of the mA and/or kV according to patient size and/or use of iterative reconstruction technique.  COMPARISON:  12/30/2021 CT  head, 01/24/2022 MRI head; no prior cervical spine CT, correlation is made with 04/17/2013 cervical spine radiographs FINDINGS: CT HEAD FINDINGS Evaluation is somewhat limited by motion artifact. Brain: No evidence of acute infarct, hemorrhage, mass, mass effect, or midline shift. No hydrocephalus or extra-axial fluid collection. Remote left occipital infarct. Redemonstrated lacunar infarcts in the left basal ganglia, left corona radiata, and bilateral thalami. Periventricular white matter changes, likely the sequela of chronic small vessel ischemic disease. Vascular: No hyperdense vessel. Skull: Normal. Negative for fracture or focal lesion. Sinuses/Orbits: Partial opacification of the posterior left ethmoid air cells and left sphenoid sinus. Status post bilateral lens replacements. Other: The mastoid air cells are well aerated. CT CERVICAL SPINE FINDINGS Alignment: Straightening of the normal cervical lordosis. No listhesis. Skull base and vertebrae: No acute fracture. No primary bone lesion or focal pathologic process. Soft tissues and spinal canal: No prevertebral fluid or swelling. No visible canal hematoma. Disc levels: Mild degenerative changes for age. No high-grade spinal canal stenosis. Upper chest: Left apical mass, which extends into the inferior left neck, which is better evaluated on the 01/15/2022 CT chest. Moderate right and large left pleural effusions, new on the right and increased on the left compared to 01/15/2022 Other: Presumed nodal metastasis in the right neck (series 4, image 40) is poorly evaluated in the absence of intravenous contrast. Additional prominent right level 2 lymph nodes (series 4, image 53) and left level 3 lymph node (series 4, image 80). IMPRESSION: 1.  No acute intracranial process. 2.  No acute fracture or traumatic listhesis in the cervical spine. 3. Known left apical mass is incompletely evaluated. Moderate right and large left pleural effusions. 4. Presumed nodal  metastases in the bilateral cervical lymph nodes. Electronically Signed   By: Merilyn Baba M.D.   On: 02/04/2022 16:19   CT Cervical Spine Wo Contrast  Result Date: 02/04/2022 CLINICAL DATA:  Generalized weakness. EXAM: CT HEAD WITHOUT CONTRAST CT CERVICAL SPINE WITHOUT CONTRAST TECHNIQUE: Multidetector CT imaging of the head and cervical spine was performed following the standard protocol without intravenous contrast. Multiplanar CT image reconstructions of the cervical spine were also generated. RADIATION DOSE REDUCTION: This exam was performed according to the departmental dose-optimization program which includes automated exposure control, adjustment of the mA and/or kV according to patient size and/or use of iterative reconstruction technique. COMPARISON:  12/30/2021 CT head, 01/24/2022 MRI head; no prior cervical spine CT, correlation is made with 04/17/2013 cervical spine radiographs FINDINGS: CT HEAD FINDINGS Evaluation is somewhat limited by motion artifact. Brain: No evidence of acute infarct, hemorrhage, mass, mass effect, or midline shift. No hydrocephalus or extra-axial fluid collection. Remote left occipital infarct. Redemonstrated lacunar infarcts in the left basal ganglia, left corona radiata, and bilateral thalami. Periventricular white matter changes, likely the sequela of chronic small vessel ischemic disease. Vascular: No hyperdense vessel. Skull: Normal. Negative for fracture or focal lesion. Sinuses/Orbits: Partial opacification of the posterior left ethmoid air cells and left sphenoid sinus. Status post bilateral lens replacements. Other: The mastoid air cells are well aerated. CT CERVICAL SPINE FINDINGS Alignment: Straightening of the normal cervical lordosis. No listhesis. Skull base and vertebrae: No acute fracture. No primary bone lesion or focal pathologic process. Soft tissues and spinal canal: No prevertebral fluid or swelling. No visible canal hematoma. Disc levels: Mild  degenerative changes for age. No high-grade spinal canal stenosis. Upper chest: Left apical mass, which extends into the inferior left neck, which is better evaluated on  the 01/15/2022 CT chest. Moderate right and large left pleural effusions, new on the right and increased on the left compared to 01/15/2022 Other: Presumed nodal metastasis in the right neck (series 4, image 40) is poorly evaluated in the absence of intravenous contrast. Additional prominent right level 2 lymph nodes (series 4, image 53) and left level 3 lymph node (series 4, image 80). IMPRESSION: 1.  No acute intracranial process. 2.  No acute fracture or traumatic listhesis in the cervical spine. 3. Known left apical mass is incompletely evaluated. Moderate right and large left pleural effusions. 4. Presumed nodal metastases in the bilateral cervical lymph nodes. Electronically Signed   By: Merilyn Baba M.D.   On: 02/04/2022 16:19   DG Chest 1 View  Result Date: 02/04/2022 CLINICAL DATA:  Syncope EXAM: CHEST  1 VIEW COMPARISON:  Chest x-ray dated January 22, 2022 FINDINGS: Visualized cardiac and mediastinal contours are within normal limits. Left suprahilar mass compatible with known malignancy. New large right pleural effusion. Evidence of pneumothorax. IMPRESSION: Left suprahilar mass with new large left pleural effusion. Electronically Signed   By: Yetta Glassman M.D.   On: 02/04/2022 15:42      Scheduled Meds:  enoxaparin (LOVENOX) injection  70 mg Subcutaneous Q12H   feeding supplement  237 mL Oral BID BM   insulin aspart  0-9 Units Subcutaneous TID WC   Continuous Infusions:  sodium chloride 100 mL/hr at 02/06/22 0417   azithromycin 250 mL/hr at 02/05/22 1824   ceFEPime (MAXIPIME) IV 2 g (02/06/22 0925)     LOS: 2 days     Dessa Phi, DO [facility] 02/06/2022, 12:57 PM   Available via Epic secure chat 7am-7pm After these hours, please refer to coverage provider listed on [email or web address]

## 2022-02-07 DIAGNOSIS — J189 Pneumonia, unspecified organism: Secondary | ICD-10-CM | POA: Diagnosis not present

## 2022-02-07 LAB — CBC
HCT: 39.8 % (ref 39.0–52.0)
Hemoglobin: 12.7 g/dL — ABNORMAL LOW (ref 13.0–17.0)
MCH: 31.1 pg (ref 26.0–34.0)
MCHC: 31.9 g/dL (ref 30.0–36.0)
MCV: 97.5 fL (ref 80.0–100.0)
Platelets: 228 10*3/uL (ref 150–400)
RBC: 4.08 MIL/uL — ABNORMAL LOW (ref 4.22–5.81)
RDW: 16.7 % — ABNORMAL HIGH (ref 11.5–15.5)
WBC: 5.2 10*3/uL (ref 4.0–10.5)
nRBC: 0 % (ref 0.0–0.2)

## 2022-02-07 LAB — BASIC METABOLIC PANEL
Anion gap: 6 (ref 5–15)
BUN: 37 mg/dL — ABNORMAL HIGH (ref 8–23)
CO2: 22 mmol/L (ref 22–32)
Calcium: 8.3 mg/dL — ABNORMAL LOW (ref 8.9–10.3)
Chloride: 117 mmol/L — ABNORMAL HIGH (ref 98–111)
Creatinine, Ser: 1.66 mg/dL — ABNORMAL HIGH (ref 0.61–1.24)
GFR, Estimated: 42 mL/min — ABNORMAL LOW (ref >60)
Glucose, Bld: 94 mg/dL (ref 70–99)
Potassium: 4 mmol/L (ref 3.5–5.1)
Sodium: 145 mmol/L (ref 135–145)

## 2022-02-07 LAB — GLUCOSE, CAPILLARY
Glucose-Capillary: 88 mg/dL (ref 70–99)
Glucose-Capillary: 107 mg/dL — ABNORMAL HIGH (ref 70–99)
Glucose-Capillary: 122 mg/dL — ABNORMAL HIGH (ref 70–99)
Glucose-Capillary: 93 mg/dL (ref 70–99)

## 2022-02-07 MED ORDER — MEROPENEM 1 G IV SOLR
1.0000 g | Freq: Two times a day (BID) | INTRAVENOUS | Status: DC
  Administered 2022-02-07 (×2): 1 g via INTRAVENOUS
  Filled 2022-02-07 (×3): qty 20

## 2022-02-07 NOTE — Progress Notes (Signed)
Physical Therapy Treatment Patient Details Name: Drew Harrington MRN: [id number] DOB: 01/03/1946 Today's Date: 02/07/2022   History of Present Illness Pt is 76 yo male who presents with diarrhea  and weakness. PMH includes stage IVB non-small cell lung cancer, adenocarcinoma with large apical LUL mass, with mediastinal, right cervical and bilateral adrenal metastases, PE involving large right main on Lovenox (dx 12/2021), HTN, dementia, CKD 3a. He was also found to have large left pleural effusion, s/p left thoracentesis on 8/11.    PT Comments    Pt agreeable with ambulating with therapy after RN encouraged him. Upon standing, noticed pt had a bowel incontinence episode. Assisted pt into the bathroom. NT in to assist with cleanup after using toilet. Pt then ambulated ~50 feet with a RW. O2 90% on RA immediately after walking. Checked O2 a 2nd time at end of session: 93% on RA with dynamap.     Recommendations for follow up therapy are one component of a multi-disciplinary discharge planning process, led by the attending physician.  Recommendations may be updated based on patient status, additional functional criteria and insurance authorization.  Follow Up Recommendations  Home health PT     Assistance Recommended at Discharge Frequent or constant Supervision/Assistance  Patient can return home with the following A little help with walking and/or transfers;A little help with bathing/dressing/bathroom;Assistance with cooking/housework;Assist for transportation;Help with stairs or ramp for entrance;Direct supervision/assist for financial management   Equipment Recommendations       Recommendations for Other Services       Precautions / Restrictions Precautions Precautions: Fall Precaution Comments: monitor O2 Restrictions Weight Bearing Restrictions: No     Mobility  Bed Mobility               General bed mobility comments: oob in recliner    Transfers Overall transfer  level: Needs assistance Equipment used: Rolling walker (2 wheels) Transfers: Sit to/from Stand Sit to Stand: Supervision           General transfer comment: Cues for safety. Increased time    Ambulation/Gait Ambulation/Gait assistance: Min guard Gait Distance (Feet): 50 Feet Assistive device: Rolling walker (2 wheels) Gait Pattern/deviations: Step-through pattern, Decreased stride length       General Gait Details: Min guard for safety. Slow gait speed. O2 90% on RA immediately after walker. 93% on RA after checking again at end of session   Stairs             Wheelchair Mobility    Modified Rankin (Stroke Patients Only)       Balance Overall balance assessment: Needs assistance         Standing balance support: During functional activity, Reliant on assistive device for balance Standing balance-Leahy Scale: Fair                              Cognition Arousal/Alertness: Awake/alert Behavior During Therapy: Flat affect Overall Cognitive Status: History of cognitive impairments - at baseline                                 General Comments: impaired STM, able to follow one step commands consistently. pleasant demeanor.        Exercises      General Comments        Pertinent Vitals/Pain Pain Assessment Pain Assessment: No/denies pain    Home Living  Prior Function            PT Goals (current goals can now be found in the care plan section) Progress towards PT goals: Progressing toward goals    Frequency    Min 3X/week      PT Plan Current plan remains appropriate    Co-evaluation              AM-PAC PT "6 Clicks" Mobility   Outcome Measure  Help needed turning from your back to your side while in a flat bed without using bedrails?: A Little Help needed moving from lying on your back to sitting on the side of a flat bed without using bedrails?: A Little Help  needed moving to and from a bed to a chair (including a wheelchair)?: A Little Help needed standing up from a chair using your arms (e.g., wheelchair or bedside chair)?: A Little Help needed to walk in hospital room?: A Little Help needed climbing 3-5 steps with a railing? : A Little 6 Click Score: 18    End of Session Equipment Utilized During Treatment: Gait belt;Oxygen Activity Tolerance: Patient tolerated treatment well Patient left: in chair;with call bell/phone within reach;with chair alarm set   PT Visit Diagnosis: Unsteadiness on feet (R26.81);Muscle weakness (generalized) (M62.81)     Time: 1030-1106 PT Time Calculation (min) (ACUTE ONLY): 36 min  Charges:  $Gait Training: 8-22 mins $Therapeutic Activity: 8-22 mins                        Doreatha Massed, PT Acute Rehabilitation  Office: [phone] Pager: [phone]

## 2022-02-07 NOTE — Progress Notes (Addendum)
PROGRESS NOTE    Drew Harrington  UQJ:[id number] DOB: 09/23/1945 DOA: 02/04/2022 PCP: Hoyt Koch, MD     Brief Narrative:  Drew Harrington is a 76 y.o. male with medical history significant of stage IVB non-small cell lung cancer, adenocarcinoma with large apical LUL mass, with mediastinal, right cervical and bilateral adrenal metastases, PE involving large right main on Lovenox (dx 12/2021), HTN, dementia, CKD 3a who presents with diarrhea  and weakness.  Patient just received first session of chemotherapy on 8/9 and then started to have diarrhea.  He slipped in the bathtub while trying to wash out and subsequently was sent to ED.  In the ED, patient had a fever, was tachycardic and tachypneic.  With elevated creatinine.  CT head and cervical spine were negative for injuries.  He was also found to have large left pleural effusion.  His Lovenox was switched to IV heparin in anticipation for thoracentesis.  Patient underwent thoracentesis 8/11 which removed 1.1 L fluid.  New events last 24 hours / Subjective: Patient has no complaints today.  He is sitting in chair, finished eating breakfast.  Assessment & Plan:   Principal Problem:   Sepsis ([place]) Active Problems:   HTN (hypertension)   DM2 (diabetes mellitus, type 2) ([place])   Adenocarcinoma of left lung, stage 4 (HCC)   Pleural effusion   History of pulmonary embolus (PE)   CAP (community acquired pneumonia)   Acute kidney injury superimposed on chronic kidney disease ([place])   Acute metabolic encephalopathy   Severe sepsis secondary to pneumonia -Sepsis present on admission with fever, tachycardia, tachypnea with endorgan damage including AKI and respiratory failure  Acinectobacter bacteremia -Found on 1 aerobic bottle -Repeat blood cultures pending   -Cefepime, azithromycin --> Merrem. Discussed with ID today   Large left pleural effusion -Status post thoracentesis 8/11 which revealed 1.1 L fluid.  Fluid is exudative  in nature   AKI on CKD 3a  -Baseline creatinine 1.4 -Improving, continue IVF 1 more day   Acute metabolic encephalopathy -Insetting of illnesses as above.  Resolved  History of PE -Lovenox  Stage IV adenocarcinoma left lung -Followed by Dr. Julien Nordmann -Currently undergoing systemic chemotherapy -Palliative care consulted   Diabetes mellitus type 2 -a1c 6.3 -Sliding scale insulin  Hypertension -Losartan/HCTZ on hold due to AKI  DVT prophylaxis: IV heparin  Code Status: DNR Family Communication: Wife over the phone  Disposition Plan:  Status is: Inpatient Remains inpatient appropriate because: IV antibiotics, IV fluid, repeat blood cultures   Antimicrobials:  Anti-infectives (From admission, onward)    Start     Dose/Rate Route Frequency Ordered Stop   02/05/22 2200  ceFEPIme (MAXIPIME) 2 g in sodium chloride 0.9 % 100 mL IVPB        2 g 200 mL/hr over 30 Minutes Intravenous Every 12 hours 02/05/22 1738     02/05/22 1800  cefTRIAXone (ROCEPHIN) 2 g in sodium chloride 0.9 % 100 mL IVPB  Status:  Discontinued        2 g 200 mL/hr over 30 Minutes Intravenous Every 24 hours 02/04/22 2026 02/05/22 1738   02/05/22 1800  azithromycin (ZITHROMAX) 500 mg in sodium chloride 0.9 % 250 mL IVPB        500 mg 250 mL/hr over 60 Minutes Intravenous Every 24 hours 02/04/22 2026 02/10/22 1759   02/04/22 1800  cefTRIAXone (ROCEPHIN) 1 g in sodium chloride 0.9 % 100 mL IVPB        1 g 200 mL/hr  over 30 Minutes Intravenous  Once 02/04/22 1757 02/04/22 2020   02/04/22 1800  azithromycin (ZITHROMAX) 500 mg in sodium chloride 0.9 % 250 mL IVPB        500 mg 250 mL/hr over 60 Minutes Intravenous  Once 02/04/22 1757 02/04/22 2146        Objective: Vitals:   02/06/22 1830 02/06/22 2023 02/07/22 0425 02/07/22 1249  BP:  128/87 (!) 149/82 125/85  Pulse:  (!) 102 (!) 107 100  Resp:  18 18 18   Temp:  98.3 F (36.8 C) 98.1 F (36.7 C) 98.4 F (36.9 C)  TempSrc:  Oral Oral   SpO2: 97%  94% 98% 100%  Weight:      Height:        Intake/Output Summary (Last 24 hours) at 02/07/2022 1252 Last data filed at 02/07/2022 1207 Gross per 24 hour  Intake 3031.6 ml  Output 1390 ml  Net 1641.6 ml    Filed Weights   02/04/22 2050  Weight: 78.3 kg    Examination:  General exam: Appears calm and comfortable  Respiratory system: Clear to auscultation bilaterally, without respiratory distress, on nasal cannula O2 Cardiovascular system: S1 & S2 heard, RRR. No murmurs.  Gastrointestinal system: Abdomen is nondistended, soft and nontender. Normal bowel sounds heard. Central nervous system: Alert Extremities: Symmetric in appearance  Skin: No rashes, lesions or ulcers on exposed skin    Data Reviewed: I have personally reviewed following labs and imaging studies  CBC: Recent Labs  Lab 02/03/22 1327 02/04/22 1510 02/05/22 0944 02/06/22 0355 02/07/22 0408  WBC 9.1 9.5 10.8* 6.4 5.2  NEUTROABS 7.4 8.1*  --   --   --   HGB 15.3 15.5 14.4 13.3 12.7*  HCT 44.5 49.0 45.4 43.7 39.8  MCV 93.5 97.2 98.9 101.9* 97.5  PLT 290 286 224 187 517    Basic Metabolic Panel: Recent Labs  Lab 02/03/22 1350 02/04/22 1622 02/05/22 0944 02/06/22 0355 02/07/22 0408  NA 143 145 143 144 145  K 4.2 4.4 4.4 4.2 4.0  CL 107 110 110 115* 117*  CO2 25 24 24 22 22   GLUCOSE 115* 130* 122* 93 94  BUN 18 28* 33* 39* 37*  CREATININE 1.68* 1.96* 1.88* 1.87* 1.66*  CALCIUM 9.6 9.0 8.5* 8.2* 8.3*  MG  --  2.3  --   --   --   PHOS  --  3.7  --   --   --     GFR: Estimated Creatinine Clearance: 37.9 mL/min (A) (by C-G formula based on SCr of 1.66 mg/dL (H)). Liver Function Tests: Recent Labs  Lab 02/03/22 1350 02/04/22 1622 02/05/22 0944  AST 28 41 47*  ALT 27 32 36  ALKPHOS 127* 122 100  BILITOT 0.6 0.4 0.5  PROT 7.1 6.8 6.1*  ALBUMIN 3.3* 3.0* 2.6*    Recent Labs  Lab 02/04/22 1622  LIPASE 25    No results for input(s): "AMMONIA" in the last 168 hours. Coagulation  Profile: Recent Labs  Lab 02/04/22 1510  INR 1.7*    Cardiac Enzymes: No results for input(s): "CKTOTAL", "CKMB", "CKMBINDEX", "TROPONINI" in the last 168 hours. BNP (last 3 results) No results for input(s): "PROBNP" in the last 8760 hours. HbA1C: No results for input(s): "HGBA1C" in the last 72 hours. CBG: Recent Labs  Lab 02/06/22 1118 02/06/22 1608 02/06/22 2022 02/07/22 0738 02/07/22 1140  GLUCAP 129* 81 94 88 107*    Lipid Profile: No results for input(s): "CHOL", "HDL", "LDLCALC", "  TRIG", "CHOLHDL", "LDLDIRECT" in the last 72 hours. Thyroid Function Tests: No results for input(s): "TSH", "T4TOTAL", "FREET4", "T3FREE", "THYROIDAB" in the last 72 hours. Anemia Panel: No results for input(s): "VITAMINB12", "FOLATE", "FERRITIN", "TIBC", "IRON", "RETICCTPCT" in the last 72 hours. Sepsis Labs: Recent Labs  Lab 02/04/22 1511 02/04/22 1711  LATICACIDVEN 2.8* 1.9     Recent Results (from the past 240 hour(s))  Culture, blood (routine x 2)     Status: Abnormal (Preliminary result)   Collection Time: 02/04/22  5:30 PM   Specimen: BLOOD  Result Value Ref Range Status   Specimen Description   Final    BLOOD LEFT ANTECUBITAL Performed at [facility] [address], [place], [place] [address]    Special Requests   Final    BOTTLES DRAWN AEROBIC AND ANAEROBIC Blood Culture adequate volume Performed at [facility] [address] [place], [place] [address]    Culture  Setup Time   Final    GRAM NEGATIVE RODS AEROBIC BOTTLE ONLY CRITICAL RESULT CALLED TO, READ BACK BY AND VERIFIED WITH: PHARMD MARY ASHLYN ON 02/05/22 1723 BY DRT    Culture (A)  Final    ACINETOBACTER LWOFFII CULTURE REINCUBATED FOR BETTER GROWTH Performed at [facility], [place] [address], [place], [place] [address]    Report Status PENDING  Incomplete  Blood Culture ID Panel (Reflexed)     Status: None   Collection Time: 02/04/22  5:30 PM  Result Value  Ref Range Status   Enterococcus faecalis NOT DETECTED NOT DETECTED Final   Enterococcus Faecium NOT DETECTED NOT DETECTED Final   Listeria monocytogenes NOT DETECTED NOT DETECTED Final   Staphylococcus species NOT DETECTED NOT DETECTED Final   Staphylococcus aureus (BCID) NOT DETECTED NOT DETECTED Final   Staphylococcus epidermidis NOT DETECTED NOT DETECTED Final   Staphylococcus lugdunensis NOT DETECTED NOT DETECTED Final   Streptococcus species NOT DETECTED NOT DETECTED Final   Streptococcus agalactiae NOT DETECTED NOT DETECTED Final   Streptococcus pneumoniae NOT DETECTED NOT DETECTED Final   Streptococcus pyogenes NOT DETECTED NOT DETECTED Final   A.calcoaceticus-baumannii NOT DETECTED NOT DETECTED Final   Bacteroides fragilis NOT DETECTED NOT DETECTED Final   Enterobacterales NOT DETECTED NOT DETECTED Final   Enterobacter cloacae complex NOT DETECTED NOT DETECTED Final   Escherichia coli NOT DETECTED NOT DETECTED Final   Klebsiella aerogenes NOT DETECTED NOT DETECTED Final   Klebsiella oxytoca NOT DETECTED NOT DETECTED Final   Klebsiella pneumoniae NOT DETECTED NOT DETECTED Final   Proteus species NOT DETECTED NOT DETECTED Final   Salmonella species NOT DETECTED NOT DETECTED Final   Serratia marcescens NOT DETECTED NOT DETECTED Final   Haemophilus influenzae NOT DETECTED NOT DETECTED Final   Neisseria meningitidis NOT DETECTED NOT DETECTED Final   Pseudomonas aeruginosa NOT DETECTED NOT DETECTED Final   Stenotrophomonas maltophilia NOT DETECTED NOT DETECTED Final   Candida albicans NOT DETECTED NOT DETECTED Final   Candida auris NOT DETECTED NOT DETECTED Final   Candida glabrata NOT DETECTED NOT DETECTED Final   Candida krusei NOT DETECTED NOT DETECTED Final   Candida parapsilosis NOT DETECTED NOT DETECTED Final   Candida tropicalis NOT DETECTED NOT DETECTED Final   Cryptococcus neoformans/gattii NOT DETECTED NOT DETECTED Final    Comment: Performed at [facility],  [address] [place], [place] [address]  Culture, blood (routine x 2)     Status: None (Preliminary result)   Collection Time: 02/04/22  5:58 PM   Specimen:  BLOOD  Result Value Ref Range Status   Specimen Description   Final    BLOOD BLOOD LEFT ARM Performed at [facility] [address], [place], [place] [address]    Special Requests   Final    BOTTLES DRAWN AEROBIC AND ANAEROBIC Blood Culture adequate volume Performed at [facility] [address] [place], [place] [address]    Culture   Final    NO GROWTH 3 DAYS Performed at [facility], [place] [address] [place], [place] [address]    Report Status PENDING  Incomplete  Culture, body fluid w Gram Stain-bottle     Status: None (Preliminary result)   Collection Time: 02/05/22  1:46 PM   Specimen: Fluid  Result Value Ref Range Status   Specimen Description FLUID PLEURAL  Final   Special Requests BOTTLES DRAWN AEROBIC AND ANAEROBIC  Final   Culture   Final    NO GROWTH 2 DAYS Performed at [facility], [place] [address] [place], [place] [address]    Report Status PENDING  Incomplete  Gram stain     Status: None   Collection Time: 02/05/22  1:46 PM   Specimen: BLOOD  Result Value Ref Range Status   Specimen Description BLOOD PLEURAL  Final   Special Requests NONE  Final   Gram Stain   Final    CYTOSPIN SMEAR NO ORGANISMS SEEN NO WBC SEEN Performed at [facility], [place] [address], [place], [place] [address]    Report Status 02/05/2022 FINAL  Final  Culture, blood (Routine X 2) w Reflex to ID Panel     Status: None (Preliminary result)   Collection Time: 02/06/22  1:41 PM   Specimen: BLOOD  Result Value Ref Range Status   Specimen Description   Final    BLOOD RIGHT ANTECUBITAL Performed at [facility] [address], [place], [place] [address]    Special Requests   Final    BOTTLES DRAWN AEROBIC AND ANAEROBIC Blood Culture adequate  volume Performed at [facility] [address], [place], [place] [address]    Culture   Final    NO GROWTH < 24 HOURS Performed at [facility] [address], [place], [place] [address]    Report Status PENDING  Incomplete  Culture, blood (Routine X 2) w Reflex to ID Panel     Status: None (Preliminary result)   Collection Time: 02/06/22  1:52 PM   Specimen: BLOOD  Result Value Ref Range Status   Specimen Description   Final    BLOOD BLOOD LEFT HAND Performed at [facility] [address], [place], [place] [address]    Special Requests   Final    BOTTLES DRAWN AEROBIC AND ANAEROBIC Blood Culture adequate volume Performed at [facility] [address] [place], [place] [address]    Culture   Final    NO GROWTH < 24 HOURS Performed at [facility] [address], [place], [place] [address]    Report Status PENDING  Incomplete      Radiology Studies: DG CHEST PORT 1 VIEW  Result Date: 02/06/2022 CLINICAL DATA:  Increased breath sounds EXAM: PORTABLE CHEST 1 VIEW COMPARISON:  02/05/2022 FINDINGS: Cardiac shadow is enlarged but stable. Mediastinum is again prominent similar to that seen on the prior exam consistent with the known left upper lobe mass lesion. Persistent left-sided pleural effusion is noted  stable in appearance from the prior exam. No pneumothorax is seen. Right lung remains clear. No acute bony abnormality is noted. IMPRESSION: Persistent left-sided pleural effusions stable from the previous day. Known left upper lobe mass lesion medially. Electronically Signed   By: Inez Catalina M.D.   On: 02/06/2022 19:36   DG Chest Port 1 View  Result Date: 02/05/2022 CLINICAL DATA:  Left thoracentesis EXAM: PORTABLE CHEST 1 VIEW COMPARISON:  Radiograph 02/04/2022 FINDINGS: Significantly decreased left pleural effusion, now small to moderate size after thoracentesis. No evidence of pneumothorax. Unchanged  cardiomediastinal silhouette. Unchanged large left upper lobe mass. IMPRESSION: No evidence of pneumothorax after thoracentesis. Decreased size of the left pleural effusion. Electronically Signed   By: Maurine Simmering M.D.   On: 02/05/2022 14:05   US THORACENTESIS ASP PLEURAL SPACE W/IMG GUIDE  Result Date: 02/05/2022 INDICATION: Patient with history of metastatic lung cancer with large left pleural effusion. For diagnostic and therapeutic left thoracentesis. EXAM: ULTRASOUND GUIDED DIAGNOSTIC AND THERAPEUTIC LEFT THORACENTESIS MEDICATIONS: 10 mL 1 % lidocaine COMPLICATIONS: None immediate. PROCEDURE: An ultrasound guided thoracentesis was thoroughly discussed with the patient and questions answered. The benefits, risks, alternatives and complications were also discussed. The patient understands and wishes to proceed with the procedure. Written consent was obtained by patient's wife. Ultrasound was performed to localize and mark an adequate pocket of fluid in the left chest. The area was then prepped and draped in the normal sterile fashion. 1% Lidocaine was used for local anesthesia. Under ultrasound guidance a 6 Fr Safe-T-Centesis catheter was introduced. Thoracentesis was performed. The catheter was removed and a dressing applied. FINDINGS: A total of approximately 1.1 L of clear, amber fluid was removed. Samples were sent to the laboratory as requested by the clinical team. IMPRESSION: Successful ultrasound guided left thoracentesis yielding 1.1 L of pleural fluid. Read by: Narda Rutherford, AGNP-BC Electronically Signed   By: Corrie Mckusick D.O.   On: 02/05/2022 14:01      Scheduled Meds:  enoxaparin (LOVENOX) injection  70 mg Subcutaneous Q12H   feeding supplement  237 mL Oral BID BM   insulin aspart  0-9 Units Subcutaneous TID WC   Continuous Infusions:  sodium chloride 100 mL/hr at 02/07/22 1207   azithromycin 500 mg (02/06/22 1806)   ceFEPime (MAXIPIME) IV 2 g (02/07/22 1032)     LOS: 3 days      Dessa Phi, DO [facility] 02/07/2022, 12:52 PM   Available via Epic secure chat 7am-7pm After these hours, please refer to coverage provider listed on [email or web address]

## 2022-02-07 NOTE — TOC Progression Note (Addendum)
Transition of Care [facility]) - Progression Note    Patient Details  Name: Drew Harrington MRN: [id number] Date of Birth: 01-07-1946  [facility] CM/SW Contact  Ross Ludwig, [place] Phone Number: 02/07/2022, 6:26 PM  Clinical Narrative:    CSW was informed by attending physician that patient's wife is concerned about how she is going to pay the copays for DME.  CSW contacted [employer], to speak to patient's wife in regards to copayment and also arranging for delivery of equipment.  CSW was asked by physician if patient would qualify for a bariatric hospital bed, CSW informed physician that patient would not qualify because he does not meet the weight requirement.  TOC to continue to follow patient's progress throughout discharge planning.  CSW spoke to [place] at [facility], and she said they tried calling patient's wife this morning, and left a message on voice mail and they were waiting for a call back from her.   Expected Discharge Plan: Home/Self Care Barriers to Discharge: Continued Medical Work up  Expected Discharge Plan and Services Expected Discharge Plan: Home/Self Care   Discharge Planning Services: CM Consult   Living arrangements for the past 2 months: Single Family Home                                       Social Determinants of Health (SDOH) Interventions    Readmission Risk Interventions     No data to display

## 2022-02-07 NOTE — Plan of Care (Signed)
  Problem: Education: Goal: Knowledge of General Education information will improve Description Including pain rating scale, medication(s)/side effects and non-pharmacologic comfort measures Outcome: Progressing   Problem: Health Behavior/Discharge Planning: Goal: Ability to manage health-related needs will improve Outcome: Progressing   

## 2022-02-08 ENCOUNTER — Ambulatory Visit (HOSPITAL_COMMUNITY): Payer: Medicare Other

## 2022-02-08 ENCOUNTER — Inpatient Hospital Stay (HOSPITAL_COMMUNITY): Admission: RE | Admit: 2022-02-08 | Payer: Medicare Other | Source: Ambulatory Visit

## 2022-02-08 ENCOUNTER — Inpatient Hospital Stay (HOSPITAL_COMMUNITY): Payer: Medicare Other

## 2022-02-08 ENCOUNTER — Telehealth: Payer: Self-pay | Admitting: Pulmonary Disease

## 2022-02-08 ENCOUNTER — Telehealth: Payer: Self-pay | Admitting: Oncology

## 2022-02-08 DIAGNOSIS — G9340 Encephalopathy, unspecified: Secondary | ICD-10-CM | POA: Diagnosis not present

## 2022-02-08 DIAGNOSIS — R6520 Severe sepsis without septic shock: Secondary | ICD-10-CM | POA: Diagnosis not present

## 2022-02-08 DIAGNOSIS — A419 Sepsis, unspecified organism: Secondary | ICD-10-CM | POA: Diagnosis not present

## 2022-02-08 HISTORY — PX: IR IMAGING GUIDED PORT INSERTION: IMG5740

## 2022-02-08 LAB — BASIC METABOLIC PANEL
Anion gap: 5 (ref 5–15)
BUN: 29 mg/dL — ABNORMAL HIGH (ref 8–23)
CO2: 22 mmol/L (ref 22–32)
Calcium: 8.1 mg/dL — ABNORMAL LOW (ref 8.9–10.3)
Chloride: 116 mmol/L — ABNORMAL HIGH (ref 98–111)
Creatinine, Ser: 1.25 mg/dL — ABNORMAL HIGH (ref 0.61–1.24)
GFR, Estimated: 60 mL/min — ABNORMAL LOW (ref >60)
Glucose, Bld: 97 mg/dL (ref 70–99)
Potassium: 3.5 mmol/L (ref 3.5–5.1)
Sodium: 143 mmol/L (ref 135–145)

## 2022-02-08 LAB — GLUCOSE, CAPILLARY
Glucose-Capillary: 89 mg/dL (ref 70–99)
Glucose-Capillary: 92 mg/dL (ref 70–99)
Glucose-Capillary: 82 mg/dL (ref 70–99)
Glucose-Capillary: 91 mg/dL (ref 70–99)

## 2022-02-08 LAB — LEGIONELLA PNEUMOPHILA SEROGP 1 UR AG: L. pneumophila Serogp 1 Ur Ag: NEGATIVE

## 2022-02-08 LAB — CYTOLOGY - NON PAP

## 2022-02-08 MED ORDER — SODIUM CHLORIDE 0.9 % IV SOLN
1.0000 g | Freq: Three times a day (TID) | INTRAVENOUS | Status: DC
Start: 2022-02-08 — End: 2022-02-08
  Administered 2022-02-08: 1 g via INTRAVENOUS
  Filled 2022-02-08: qty 20

## 2022-02-08 MED ORDER — METOPROLOL SUCCINATE ER 25 MG PO TB24
25.0000 mg | ORAL_TABLET | Freq: Every day | ORAL | Status: DC
  Administered 2022-02-08 – 2022-02-12 (×5): 25 mg via ORAL
  Filled 2022-02-08 (×5): qty 1

## 2022-02-08 MED ORDER — LIDOCAINE-EPINEPHRINE 1 %-1:100000 IJ SOLN
INTRAMUSCULAR | Status: AC
  Filled 2022-02-08: qty 1

## 2022-02-08 MED ORDER — HEPARIN SOD (PORK) LOCK FLUSH 100 UNIT/ML IV SOLN
INTRAVENOUS | Status: AC
  Filled 2022-02-08: qty 5

## 2022-02-08 MED ORDER — FENTANYL CITRATE (PF) 100 MCG/2ML IJ SOLN
INTRAMUSCULAR | Status: AC | PRN
  Administered 2022-02-08 (×2): 50 ug via INTRAVENOUS

## 2022-02-08 MED ORDER — CEFEPIME HCL 2 G IV SOLR
2.0000 g | Freq: Two times a day (BID) | INTRAVENOUS | Status: DC
  Administered 2022-02-08 – 2022-02-10 (×5): 2 g via INTRAVENOUS
  Filled 2022-02-08 (×5): qty 12.5

## 2022-02-08 MED ORDER — MIDAZOLAM HCL 2 MG/2ML IJ SOLN
INTRAMUSCULAR | Status: AC | PRN
  Administered 2022-02-08: 1 mg via INTRAVENOUS

## 2022-02-08 MED ORDER — LIDOCAINE-EPINEPHRINE 1 %-1:100000 IJ SOLN
INTRAMUSCULAR | Status: AC | PRN
  Administered 2022-02-08: 20 mL

## 2022-02-08 MED ORDER — FENTANYL CITRATE (PF) 100 MCG/2ML IJ SOLN
INTRAMUSCULAR | Status: AC
  Filled 2022-02-08: qty 2

## 2022-02-08 MED ORDER — MIDAZOLAM HCL 2 MG/2ML IJ SOLN
INTRAMUSCULAR | Status: AC
  Filled 2022-02-08: qty 2

## 2022-02-08 NOTE — Progress Notes (Signed)
PROGRESS NOTE    Drew Harrington  KGM:[id number] DOB: June 18, 1946 DOA: 02/04/2022 PCP: Hoyt Koch, MD     Brief Narrative:  Drew Harrington is a 76 y.o. male with medical history significant of stage IVB non-small cell lung cancer, adenocarcinoma with large apical LUL mass, with mediastinal, right cervical and bilateral adrenal metastases, PE involving large right main on Lovenox (dx 12/2021), HTN, dementia, CKD 3a who presents with diarrhea  and weakness.  Patient just received first session of chemotherapy on 8/9 and then started to have diarrhea.  He slipped in the bathtub while trying to wash out and subsequently was sent to ED.  In the ED, patient had a fever, was tachycardic and tachypneic.  With elevated creatinine.  CT head and cervical spine were negative for injuries.  He was also found to have large left pleural effusion.  His Lovenox was switched to IV heparin in anticipation for thoracentesis.  Patient underwent thoracentesis 8/11 which removed 1.1 L fluid.  New events last 24 hours / Subjective: No new complaints or symptoms. Sitting in chair this morning.   Assessment & Plan:   Principal Problem:   Sepsis ([place]) Active Problems:   HTN (hypertension)   DM2 (diabetes mellitus, type 2) (HCC)   Adenocarcinoma of left lung, stage 4 (HCC)   Pleural effusion   History of pulmonary embolus (PE)   CAP (community acquired pneumonia)   Acute kidney injury superimposed on chronic kidney disease ([place])   Acute metabolic encephalopathy   Severe sepsis secondary to pneumonia -Sepsis present on admission with fever, tachycardia, tachypnea with endorgan damage including AKI and respiratory failure  -Cefepime   Acinectobacter bacteremia -Found on 1 aerobic bottle 8/10  -Repeat blood cultures 8/12 negative    -Cefepime, azithromycin --> Discussed with Dr. West Bali ID 8/13 and recommended to change to Merrem. Discussed with Dr. Candiss Norse ID 8/14 and noted cefepime is ok, treat  for 7 days. Awaiting further susceptibility testing   Large left pleural effusion -Status post thoracentesis 8/11 which revealed 1.1 L fluid.  Fluid is exudative in nature   AKI on CKD 3a  -Baseline creatinine 1.4 -Resolved   Acute metabolic encephalopathy -Insetting of illnesses as above.  Resolved  History of PE -Lovenox  Stage IV adenocarcinoma left lung -Followed by Dr. Julien Nordmann -Currently undergoing systemic chemotherapy -IR to place port today -Palliative care consulted   Diabetes mellitus type 2 -a1c 6.3 -Sliding scale insulin  Hypertension -Losartan/HCTZ on hold due to AKI -Toprol   DVT prophylaxis: Lovenox   Code Status: DNR Family Communication: Wife over the phone  Disposition Plan:  Status is: Inpatient Remains inpatient appropriate because: IV antibiotics   Antimicrobials:  Anti-infectives (From admission, onward)    Start     Dose/Rate Route Frequency Ordered Stop   02/08/22 0930  meropenem (MERREM) 1 g in sodium chloride 0.9 % 100 mL IVPB        1 g 200 mL/hr over 30 Minutes Intravenous Every 8 hours 02/08/22 0830     02/07/22 1430  meropenem (MERREM) 1 g in sodium chloride 0.9 % 100 mL IVPB  Status:  Discontinued        1 g 200 mL/hr over 30 Minutes Intravenous Every 12 hours 02/07/22 1337 02/08/22 0830   02/05/22 2200  ceFEPIme (MAXIPIME) 2 g in sodium chloride 0.9 % 100 mL IVPB  Status:  Discontinued        2 g 200 mL/hr over 30 Minutes Intravenous Every 12 hours 02/05/22  1738 02/07/22 1337   02/05/22 1800  cefTRIAXone (ROCEPHIN) 2 g in sodium chloride 0.9 % 100 mL IVPB  Status:  Discontinued        2 g 200 mL/hr over 30 Minutes Intravenous Every 24 hours 02/04/22 2026 02/05/22 1738   02/05/22 1800  azithromycin (ZITHROMAX) 500 mg in sodium chloride 0.9 % 250 mL IVPB  Status:  Discontinued        500 mg 250 mL/hr over 60 Minutes Intravenous Every 24 hours 02/04/22 2026 02/07/22 1338   02/04/22 1800  cefTRIAXone (ROCEPHIN) 1 g in sodium chloride  0.9 % 100 mL IVPB        1 g 200 mL/hr over 30 Minutes Intravenous  Once 02/04/22 1757 02/04/22 2020   02/04/22 1800  azithromycin (ZITHROMAX) 500 mg in sodium chloride 0.9 % 250 mL IVPB        500 mg 250 mL/hr over 60 Minutes Intravenous  Once 02/04/22 1757 02/04/22 2146        Objective: Vitals:   02/07/22 2140 02/07/22 2147 02/08/22 0404 02/08/22 1039  BP: (!) 144/88  (!) 177/97 134/89  Pulse: 98  93 (!) 102  Resp: 17  18   Temp: 98.1 F (36.7 C)  98.4 F (36.9 C)   TempSrc: Oral  Oral   SpO2: (!) 87% 91% 98%   Weight:      Height:        Intake/Output Summary (Last 24 hours) at 02/08/2022 1229 Last data filed at 02/08/2022 0900 Gross per 24 hour  Intake 1765.96 ml  Output 1300 ml  Net 465.96 ml    Filed Weights   02/04/22 2050  Weight: 78.3 kg    Examination:  General exam: Appears calm and comfortable  Respiratory system: Clear to auscultation bilaterally, without respiratory distress, on nasal cannula O2 Cardiovascular system: S1 & S2 heard, RRR. No murmurs.  Gastrointestinal system: Abdomen is nondistended, soft and nontender. Normal bowel sounds heard. Central nervous system: Alert Extremities: Symmetric in appearance  Skin: No rashes, lesions or ulcers on exposed skin    Data Reviewed: I have personally reviewed following labs and imaging studies  CBC: Recent Labs  Lab 02/03/22 1327 02/04/22 1510 02/05/22 0944 02/06/22 0355 02/07/22 0408  WBC 9.1 9.5 10.8* 6.4 5.2  NEUTROABS 7.4 8.1*  --   --   --   HGB 15.3 15.5 14.4 13.3 12.7*  HCT 44.5 49.0 45.4 43.7 39.8  MCV 93.5 97.2 98.9 101.9* 97.5  PLT 290 286 224 187 756    Basic Metabolic Panel: Recent Labs  Lab 02/04/22 1622 02/05/22 0944 02/06/22 0355 02/07/22 0408 02/08/22 0348  NA 145 143 144 145 143  K 4.4 4.4 4.2 4.0 3.5  CL 110 110 115* 117* 116*  CO2 24 24 22 22 22   GLUCOSE 130* 122* 93 94 97  BUN 28* 33* 39* 37* 29*  CREATININE 1.96* 1.88* 1.87* 1.66* 1.25*  CALCIUM 9.0 8.5*  8.2* 8.3* 8.1*  MG 2.3  --   --   --   --   PHOS 3.7  --   --   --   --     GFR: Estimated Creatinine Clearance: 50.3 mL/min (A) (by C-G formula based on SCr of 1.25 mg/dL (H)). Liver Function Tests: Recent Labs  Lab 02/03/22 1350 02/04/22 1622 02/05/22 0944  AST 28 41 47*  ALT 27 32 36  ALKPHOS 127* 122 100  BILITOT 0.6 0.4 0.5  PROT 7.1 6.8 6.1*  ALBUMIN 3.3*  3.0* 2.6*    Recent Labs  Lab 02/04/22 1622  LIPASE 25    No results for input(s): "AMMONIA" in the last 168 hours. Coagulation Profile: Recent Labs  Lab 02/04/22 1510  INR 1.7*    Cardiac Enzymes: No results for input(s): "CKTOTAL", "CKMB", "CKMBINDEX", "TROPONINI" in the last 168 hours. BNP (last 3 results) No results for input(s): "PROBNP" in the last 8760 hours. HbA1C: No results for input(s): "HGBA1C" in the last 72 hours. CBG: Recent Labs  Lab 02/07/22 1140 02/07/22 1626 02/07/22 2140 02/08/22 0734 02/08/22 1139  GLUCAP 107* 122* 93 89 92    Lipid Profile: No results for input(s): "CHOL", "HDL", "LDLCALC", "TRIG", "CHOLHDL", "LDLDIRECT" in the last 72 hours. Thyroid Function Tests: No results for input(s): "TSH", "T4TOTAL", "FREET4", "T3FREE", "THYROIDAB" in the last 72 hours. Anemia Panel: No results for input(s): "VITAMINB12", "FOLATE", "FERRITIN", "TIBC", "IRON", "RETICCTPCT" in the last 72 hours. Sepsis Labs: Recent Labs  Lab 02/04/22 1511 02/04/22 1711  LATICACIDVEN 2.8* 1.9     Recent Results (from the past 240 hour(s))  Culture, blood (routine x 2)     Status: Abnormal (Preliminary result)   Collection Time: 02/04/22  5:30 PM   Specimen: BLOOD  Result Value Ref Range Status   Specimen Description   Final    BLOOD LEFT ANTECUBITAL Performed at [facility] [address] [place], [place] [address]    Special Requests   Final    BOTTLES DRAWN AEROBIC AND ANAEROBIC Blood Culture adequate volume Performed at [facility]  [address], [place], [place] [address]    Culture  Setup Time   Final    GRAM NEGATIVE RODS IN BOTH AEROBIC AND ANAEROBIC BOTTLES CRITICAL RESULT CALLED TO, READ BACK BY AND VERIFIED WITH: PHARMD MARY ASHLYN ON 02/05/22 1723 BY DRT    Culture (A)  Final    ACINETOBACTER LWOFFII Sent to [facility] for further susceptibility testing. Performed at [facility], [place] [address], [place], [place] [address]    Report Status PENDING  Incomplete  Blood Culture ID Panel (Reflexed)     Status: None   Collection Time: 02/04/22  5:30 PM  Result Value Ref Range Status   Enterococcus faecalis NOT DETECTED NOT DETECTED Final   Enterococcus Faecium NOT DETECTED NOT DETECTED Final   Listeria monocytogenes NOT DETECTED NOT DETECTED Final   Staphylococcus species NOT DETECTED NOT DETECTED Final   Staphylococcus aureus (BCID) NOT DETECTED NOT DETECTED Final   Staphylococcus epidermidis NOT DETECTED NOT DETECTED Final   Staphylococcus lugdunensis NOT DETECTED NOT DETECTED Final   Streptococcus species NOT DETECTED NOT DETECTED Final   Streptococcus agalactiae NOT DETECTED NOT DETECTED Final   Streptococcus pneumoniae NOT DETECTED NOT DETECTED Final   Streptococcus pyogenes NOT DETECTED NOT DETECTED Final   A.calcoaceticus-baumannii NOT DETECTED NOT DETECTED Final   Bacteroides fragilis NOT DETECTED NOT DETECTED Final   Enterobacterales NOT DETECTED NOT DETECTED Final   Enterobacter cloacae complex NOT DETECTED NOT DETECTED Final   Escherichia coli NOT DETECTED NOT DETECTED Final   Klebsiella aerogenes NOT DETECTED NOT DETECTED Final   Klebsiella oxytoca NOT DETECTED NOT DETECTED Final   Klebsiella pneumoniae NOT DETECTED NOT DETECTED Final   Proteus species NOT DETECTED NOT DETECTED Final   Salmonella species NOT DETECTED NOT DETECTED Final   Serratia marcescens NOT DETECTED NOT DETECTED Final   Haemophilus influenzae NOT DETECTED NOT DETECTED Final   Neisseria meningitidis NOT DETECTED NOT DETECTED  Final   Pseudomonas  aeruginosa NOT DETECTED NOT DETECTED Final   Stenotrophomonas maltophilia NOT DETECTED NOT DETECTED Final   Candida albicans NOT DETECTED NOT DETECTED Final   Candida auris NOT DETECTED NOT DETECTED Final   Candida glabrata NOT DETECTED NOT DETECTED Final   Candida krusei NOT DETECTED NOT DETECTED Final   Candida parapsilosis NOT DETECTED NOT DETECTED Final   Candida tropicalis NOT DETECTED NOT DETECTED Final   Cryptococcus neoformans/gattii NOT DETECTED NOT DETECTED Final    Comment: Performed at [facility], [place] [address], [place], [place] [address]  Culture, blood (routine x 2)     Status: None (Preliminary result)   Collection Time: 02/04/22  5:58 PM   Specimen: BLOOD  Result Value Ref Range Status   Specimen Description   Final    BLOOD BLOOD LEFT ARM Performed at [facility] [address], [place], [place] [address]    Special Requests   Final    BOTTLES DRAWN AEROBIC AND ANAEROBIC Blood Culture adequate volume Performed at [facility] [address], [place], [place] [address]    Culture   Final    NO GROWTH 4 DAYS Performed at [facility], [employer] [address] [place], [place] [address]    Report Status PENDING  Incomplete  Culture, body fluid w Gram Stain-bottle     Status: None (Preliminary result)   Collection Time: 02/05/22  1:46 PM   Specimen: Fluid  Result Value Ref Range Status   Specimen Description FLUID PLEURAL  Final   Special Requests BOTTLES DRAWN AEROBIC AND ANAEROBIC  Final   Culture   Final    NO GROWTH 3 DAYS Performed at [facility], [place] [address] [place], [place] [address]    Report Status PENDING  Incomplete  Gram stain     Status: None   Collection Time: 02/05/22  1:46 PM   Specimen: BLOOD  Result Value Ref Range Status   Specimen Description BLOOD PLEURAL  Final   Special Requests NONE  Final   Gram Stain   Final    CYTOSPIN SMEAR NO ORGANISMS SEEN NO WBC  SEEN Performed at [facility], [place] [address], [place], [place] [address]    Report Status 02/05/2022 FINAL  Final  Culture, blood (Routine X 2) w Reflex to ID Panel     Status: None (Preliminary result)   Collection Time: 02/06/22  1:41 PM   Specimen: BLOOD  Result Value Ref Range Status   Specimen Description   Final    BLOOD RIGHT ANTECUBITAL Performed at [facility] [address] [place], [place] [address]    Special Requests   Final    BOTTLES DRAWN AEROBIC AND ANAEROBIC Blood Culture adequate volume Performed at [facility] [address] [place], [place] [address]    Culture   Final    NO GROWTH 2 DAYS Performed at [facility] [address], [place], [place] [address]    Report Status PENDING  Incomplete  Culture, blood (Routine X 2) w Reflex to ID Panel     Status: None (Preliminary result)   Collection Time: 02/06/22  1:52 PM   Specimen: BLOOD  Result Value Ref Range Status   Specimen Description   Final    BLOOD BLOOD LEFT HAND Performed at [facility] [address], [place], [place] [address]    Special Requests   Final    BOTTLES DRAWN AEROBIC  AND ANAEROBIC Blood Culture adequate volume Performed at [facility] [address], [place], [place] [address]    Culture   Final    NO GROWTH 2 DAYS Performed at [facility] [address] [place], [place] [address]    Report Status PENDING  Incomplete      Radiology Studies: DG CHEST PORT 1 VIEW  Result Date: 02/06/2022 CLINICAL DATA:  Increased breath sounds EXAM: PORTABLE CHEST 1 VIEW COMPARISON:  02/05/2022 FINDINGS: Cardiac shadow is enlarged but stable. Mediastinum is again prominent similar to that seen on the prior exam consistent with the known left upper lobe mass lesion. Persistent left-sided pleural effusion is noted stable in appearance from the prior exam. No pneumothorax is seen. Right lung  remains clear. No acute bony abnormality is noted. IMPRESSION: Persistent left-sided pleural effusions stable from the previous day. Known left upper lobe mass lesion medially. Electronically Signed   By: Inez Catalina M.D.   On: 02/06/2022 19:36      Scheduled Meds:  enoxaparin (LOVENOX) injection  70 mg Subcutaneous Q12H   feeding supplement  237 mL Oral BID BM   insulin aspart  0-9 Units Subcutaneous TID WC   metoprolol succinate  25 mg Oral Daily   Continuous Infusions:  meropenem (MERREM) IV 1 g (02/08/22 1042)     LOS: 4 days     Dessa Phi, DO [facility] 02/08/2022, 12:29 PM   Available via Epic secure chat 7am-7pm After these hours, please refer to coverage provider listed on [email or web address]

## 2022-02-08 NOTE — Telephone Encounter (Signed)
-----   Message from Magdalen Spatz, NP sent at 02/08/2022 11:24 AM EDT ----- Regarding: FW: Referral/F/U OK Icard says this can wait until he has completed treatment  ----- Message ----- From: Garner Nash, DO Sent: 02/01/2022  12:42 PM EDT To: Magdalen Spatz, NP; Haze Boyden Cantey Subject: RE: Referral/F/U                                They can follow up with [place] in a few months after completing treatments Thanks Brad    ----- Message ----- From: Sherlynn Carbon Sent: 02/01/2022  12:09 PM EDT To: Magdalen Spatz, NP; Garner Nash, DO; # Subject: Referral/F/U                                   Referral received from Dr Julien Nordmann, bronc done on 01/13/22 by Dr Valeta Harms. Does Pt need to come in office for a follow up?  Thanks

## 2022-02-08 NOTE — Progress Notes (Signed)
Mobility Specialist - Progress Note   Pre-mobility: 153/100 (117)  BP, 95% SpO2  Post-mobility: 164/106 (122) BP, 92% SPO2   02/08/22 1100  Mobility  Activity Ambulated with assistance in hallway  Level of Assistance Contact guard assist, steadying assist  Assistive Device Other (Comment) (hallway rails)  Distance Ambulated (ft) 25 ft  Activity Response Tolerated well  $Mobility charge 1 Mobility   Pt was found in recliner chair and was agreeable to mobilize. Pt returned to recliner chair after session with all necessities in reach.   Ferd Hibbs Mobility Specialist

## 2022-02-08 NOTE — Telephone Encounter (Signed)
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  He said he is not having any pain and that his left hip area is not painful.

## 2022-02-08 NOTE — Procedures (Signed)
Vascular and Interventional Radiology Procedure Note  Patient: Drew Harrington DOB: 08-Nov-1945 Medical Record Number: [id number] Note Date/Time: 02/08/22 4:50 PM   Performing Physician: Michaelle Birks, MD Assistant(s): None  Diagnosis: Lung cancer  Procedure: PORT PLACEMENT  Anesthesia: Conscious Sedation Complications: None Estimated Blood Loss: Minimal  Findings:  RIJV thrombus. Patent REJV. Successful right-sided port placement, with the tip of the catheter in the proximal right atrium.  Plan: Catheter ready for use.  See detailed procedure note with images in PACS. The patient tolerated the procedure well without incident or complication and was returned to Floor Bed in stable condition.    Michaelle Birks, MD Vascular [facility] Interventional Radiology Specialists [facility]   Pager. [place]

## 2022-02-08 NOTE — Progress Notes (Signed)
Referring Physician(s):Mohamed,M  Supervising Physician: Mugweru,J  Patient Status:  [name] - In-pt  Chief Complaint:  Lung cancer  Subjective: Patient known to IR service from left thoracentesis on 02/05/2022.  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  Recently admitted to [facility] with weakness , diarrhea, fever ,elevated creatinine. Acinetobacter was noted on 1 blood cx from 8/11. Latest blood cx neg to date. WBC nl. Pt afebrile.  He was originally scheduled for port a cath placement today as OP. Request now received for port placement as IP. He currently denies fever,HA,CP,worsening dyspnea, abd/back pain,N/V or bleeding. He does have occ cough.      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[place])    Borderline diabetes    Cataract    bilateral lens implants   Diabetes mellitus without complication (HCC)    HTN (hypertension)    Hyperlipidemia    Insomnia, unspecified    Perirectal abscess 05/10/2013   Personal history of colonic adenomas 04/10/2010   Psychosexual dysfunction with inhibited sexual excitement    Stroke ([place])    Subacute intracranial hemorrhage (HCC)    Unspecified hemorrhoids without mention of complication    Past Surgical History:  Procedure Laterality Date   BRONCHIAL NEEDLE ASPIRATION BIOPSY  01/13/2022   Procedure: BRONCHIAL NEEDLE ASPIRATION BIOPSIES;  Surgeon: Garner Nash, DO;  Location: [facility];  Service: Pulmonary;;   CATARACT EXTRACTION W/ INTRAOCULAR LENS  IMPLANT, BILATERAL     COLONOSCOPY     removal cyst hip Left 05/23/2013   TRANSTHORACIC ECHOCARDIOGRAM  04-06-2006   LVSF NORMAL/  EF 60%/ AORTIC ROOT AT UPPER LIMITS OF NORMAL SIZE   TRANSURETHRAL RESECTION OF BLADDER TUMOR  10/08/2011    Procedure: CIRCUMCISION AND TRANSURETHRAL RESECTION OF BLADDER TUMOR (TURBT);  Surgeon: [name], MD;  Location: [facility];  Service: Urology;  Laterality: N/A;   TRANSURETHRAL RESECTION OF BLADDER TUMOR  11/30/2011   Procedure: TRANSURETHRAL RESECTION OF BLADDER TUMOR (TURBT);  Surgeon: [name], MD;  Location: [facility];  Service: Urology;  Laterality: N/A;   VIDEO BRONCHOSCOPY WITH ENDOBRONCHIAL ULTRASOUND Left 01/13/2022   Procedure: VIDEO BRONCHOSCOPY WITH ENDOBRONCHIAL ULTRASOUND;  Surgeon: Garner Nash, DO;  Location: [place];  Service: Pulmonary;  Laterality: Left;     Allergies: Sildenafil  Medications: Prior to Admission medications   Medication Sig Start Date End Date Taking? Authorizing Provider  losartan-hydrochlorothiazide (HYZAAR) 100-25 MG tablet TAKE 1 TABLET BY MOUTH DAILY 01/12/22  Yes Hoyt Koch, MD  metFORMIN (GLUCOPHAGE) 1000 MG tablet TAKE 1 TABLET(1000 MG) BY MOUTH DAILY WITH BREAKFAST 01/21/22  Yes Hoyt Koch, MD  metoprolol succinate (TOPROL-XL) 25 MG 24 hr tablet Take 1 tablet by mouth in the morning and at bedtime. Patient taking differently: Take 25 mg by mouth daily. 01/19/22  Yes Kathie Dike, MD  potassium chloride SA (KLOR-CON) 20 MEQ tablet Take 1 tablet (20 mEq total) by mouth daily. 11/11/20  Yes Hoyt Koch, MD  primidone (MYSOLINE) 250 MG tablet Take 500 mg by mouth in the morning and at bedtime. 11/07/18  Yes [provider]  enoxaparin (LOVENOX) 80 MG/0.8ML injection Inject 0.7 mLs (70 mg total) into the skin every 12 hours. 01/19/22 03/20/22  Kathie Dike, MD  enoxaparin (LOVENOX) 80  MG/0.8ML injection Inject 0.7 mls into the skin every 12 hours 01/19/22   Kathie Dike, MD  folic acid (FOLVITE) 1 MG tablet Take 1 tablet (1 mg total) by mouth daily. 01/28/22   Curt Bears, MD  lidocaine-prilocaine (EMLA) cream Apply to the Port-A-Cath site  30-60-minutes before treatment 01/28/22   Curt Bears, MD  prochlorperazine (COMPAZINE) 10 MG tablet Take 1 tablet (10 mg total) by mouth every 6 (six) hours as needed for nausea or vomiting. 01/28/22   Curt Bears, MD  simvastatin (ZOCOR) 20 MG tablet TAKE 1 TABLET(20 MG) BY MOUTH AT BEDTIME Patient not taking: Reported on 02/05/2022 07/02/21   Janith Lima, MD     Vital Signs: BP (!) 177/97 (BP Location: Left Arm)   Pulse 93   Temp 98.4 F (36.9 C) (Oral)   Resp 18   Ht 5\' 9"  (1.753 m)   Wt 172 lb 9.9 oz (78.3 kg)   SpO2 98%   BMI 25.49 kg/m   Physical Exam awake, answers simple questions ok, chest- dim BS left base, scatt wheeze noted; heart- tachy but reg rhythm; abd- soft,+BS,NT; no LE edema  Imaging: DG CHEST PORT 1 VIEW  Result Date: 02/06/2022 CLINICAL DATA:  Increased breath sounds EXAM: PORTABLE CHEST 1 VIEW COMPARISON:  02/05/2022 FINDINGS: Cardiac shadow is enlarged but stable. Mediastinum is again prominent similar to that seen on the prior exam consistent with the known left upper lobe mass lesion. Persistent left-sided pleural effusion is noted stable in appearance from the prior exam. No pneumothorax is seen. Right lung remains clear. No acute bony abnormality is noted. IMPRESSION: Persistent left-sided pleural effusions stable from the previous day. Known left upper lobe mass lesion medially. Electronically Signed   By: Inez Catalina M.D.   On: 02/06/2022 19:36   DG Chest Port 1 View  Result Date: 02/05/2022 CLINICAL DATA:  Left thoracentesis EXAM: PORTABLE CHEST 1 VIEW COMPARISON:  Radiograph 02/04/2022 FINDINGS: Significantly decreased left pleural effusion, now small to moderate size after thoracentesis. No evidence of pneumothorax. Unchanged cardiomediastinal silhouette. Unchanged large left upper lobe mass. IMPRESSION: No evidence of pneumothorax after thoracentesis. Decreased size of the left pleural effusion. Electronically Signed   By: Maurine Simmering M.D.   On:  02/05/2022 14:05   US THORACENTESIS ASP PLEURAL SPACE W/IMG GUIDE  Result Date: 02/05/2022 INDICATION: Patient with history of metastatic lung cancer with large left pleural effusion. For diagnostic and therapeutic left thoracentesis. EXAM: ULTRASOUND GUIDED DIAGNOSTIC AND THERAPEUTIC LEFT THORACENTESIS MEDICATIONS: 10 mL 1 % lidocaine COMPLICATIONS: None immediate. PROCEDURE: An ultrasound guided thoracentesis was thoroughly discussed with the patient and questions answered. The benefits, risks, alternatives and complications were also discussed. The patient understands and wishes to proceed with the procedure. Written consent was obtained by patient's wife. Ultrasound was performed to localize and mark an adequate pocket of fluid in the left chest. The area was then prepped and draped in the normal sterile fashion. 1% Lidocaine was used for local anesthesia. Under ultrasound guidance a 6 Fr Safe-T-Centesis catheter was introduced. Thoracentesis was performed. The catheter was removed and a dressing applied. FINDINGS: A total of approximately 1.1 L of clear, amber fluid was removed. Samples were sent to the laboratory as requested by the clinical team. IMPRESSION: Successful ultrasound guided left thoracentesis yielding 1.1 L of pleural fluid. Read by: Narda Rutherford, AGNP-BC Electronically Signed   By: Corrie Mckusick D.O.   On: 02/05/2022 14:01   CT Head Wo Contrast  Result Date: 02/04/2022 CLINICAL DATA:  Generalized weakness. EXAM: CT HEAD WITHOUT CONTRAST CT CERVICAL SPINE WITHOUT CONTRAST TECHNIQUE: Multidetector CT imaging of the head and cervical spine was performed following the standard protocol without intravenous contrast. Multiplanar CT image reconstructions of the cervical spine were also generated. RADIATION DOSE REDUCTION: This exam was performed according to the departmental dose-optimization program which includes automated exposure control, adjustment of the mA and/or kV according to patient  size and/or use of iterative reconstruction technique. COMPARISON:  12/30/2021 CT head, 01/24/2022 MRI head; no prior cervical spine CT, correlation is made with 04/17/2013 cervical spine radiographs FINDINGS: CT HEAD FINDINGS Evaluation is somewhat limited by motion artifact. Brain: No evidence of acute infarct, hemorrhage, mass, mass effect, or midline shift. No hydrocephalus or extra-axial fluid collection. Remote left occipital infarct. Redemonstrated lacunar infarcts in the left basal ganglia, left corona radiata, and bilateral thalami. Periventricular white matter changes, likely the sequela of chronic small vessel ischemic disease. Vascular: No hyperdense vessel. Skull: Normal. Negative for fracture or focal lesion. Sinuses/Orbits: Partial opacification of the posterior left ethmoid air cells and left sphenoid sinus. Status post bilateral lens replacements. Other: The mastoid air cells are well aerated. CT CERVICAL SPINE FINDINGS Alignment: Straightening of the normal cervical lordosis. No listhesis. Skull base and vertebrae: No acute fracture. No primary bone lesion or focal pathologic process. Soft tissues and spinal canal: No prevertebral fluid or swelling. No visible canal hematoma. Disc levels: Mild degenerative changes for age. No high-grade spinal canal stenosis. Upper chest: Left apical mass, which extends into the inferior left neck, which is better evaluated on the 01/15/2022 CT chest. Moderate right and large left pleural effusions, new on the right and increased on the left compared to 01/15/2022 Other: Presumed nodal metastasis in the right neck (series 4, image 40) is poorly evaluated in the absence of intravenous contrast. Additional prominent right level 2 lymph nodes (series 4, image 53) and left level 3 lymph node (series 4, image 80). IMPRESSION: 1.  No acute intracranial process. 2.  No acute fracture or traumatic listhesis in the cervical spine. 3. Known left apical mass is incompletely  evaluated. Moderate right and large left pleural effusions. 4. Presumed nodal metastases in the bilateral cervical lymph nodes. Electronically Signed   By: Merilyn Baba M.D.   On: 02/04/2022 16:19   CT Cervical Spine Wo Contrast  Result Date: 02/04/2022 CLINICAL DATA:  Generalized weakness. EXAM: CT HEAD WITHOUT CONTRAST CT CERVICAL SPINE WITHOUT CONTRAST TECHNIQUE: Multidetector CT imaging of the head and cervical spine was performed following the standard protocol without intravenous contrast. Multiplanar CT image reconstructions of the cervical spine were also generated. RADIATION DOSE REDUCTION: This exam was performed according to the departmental dose-optimization program which includes automated exposure control, adjustment of the mA and/or kV according to patient size and/or use of iterative reconstruction technique. COMPARISON:  12/30/2021 CT head, 01/24/2022 MRI head; no prior cervical spine CT, correlation is made with 04/17/2013 cervical spine radiographs FINDINGS: CT HEAD FINDINGS Evaluation is somewhat limited by motion artifact. Brain: No evidence of acute infarct, hemorrhage, mass, mass effect, or midline shift. No hydrocephalus or extra-axial fluid collection. Remote left occipital infarct. Redemonstrated lacunar infarcts in the left basal ganglia, left corona radiata, and bilateral thalami. Periventricular white matter changes, likely the sequela of chronic small vessel ischemic disease. Vascular: No hyperdense vessel. Skull: Normal. Negative for fracture or focal lesion. Sinuses/Orbits: Partial opacification of the posterior left ethmoid air cells and left sphenoid sinus. Status post bilateral lens replacements. Other: The mastoid air  cells are well aerated. CT CERVICAL SPINE FINDINGS Alignment: Straightening of the normal cervical lordosis. No listhesis. Skull base and vertebrae: No acute fracture. No primary bone lesion or focal pathologic process. Soft tissues and spinal canal: No  prevertebral fluid or swelling. No visible canal hematoma. Disc levels: Mild degenerative changes for age. No high-grade spinal canal stenosis. Upper chest: Left apical mass, which extends into the inferior left neck, which is better evaluated on the 01/15/2022 CT chest. Moderate right and large left pleural effusions, new on the right and increased on the left compared to 01/15/2022 Other: Presumed nodal metastasis in the right neck (series 4, image 40) is poorly evaluated in the absence of intravenous contrast. Additional prominent right level 2 lymph nodes (series 4, image 53) and left level 3 lymph node (series 4, image 80). IMPRESSION: 1.  No acute intracranial process. 2.  No acute fracture or traumatic listhesis in the cervical spine. 3. Known left apical mass is incompletely evaluated. Moderate right and large left pleural effusions. 4. Presumed nodal metastases in the bilateral cervical lymph nodes. Electronically Signed   By: Merilyn Baba M.D.   On: 02/04/2022 16:19   DG Chest 1 View  Result Date: 02/04/2022 CLINICAL DATA:  Syncope EXAM: CHEST  1 VIEW COMPARISON:  Chest x-ray dated January 22, 2022 FINDINGS: Visualized cardiac and mediastinal contours are within normal limits. Left suprahilar mass compatible with known malignancy. New large right pleural effusion. Evidence of pneumothorax. IMPRESSION: Left suprahilar mass with new large left pleural effusion. Electronically Signed   By: Yetta Glassman M.D.   On: 02/04/2022 15:42    Labs:  CBC: Recent Labs    02/04/22 1510 02/05/22 0944 02/06/22 0355 02/07/22 0408  WBC 9.5 10.8* 6.4 5.2  HGB 15.5 14.4 13.3 12.7*  HCT 49.0 45.4 43.7 39.8  PLT 286 224 187 228    COAGS: Recent Labs    02/04/22 1510  INR 1.7*  APTT 22*    BMP: Recent Labs    02/05/22 0944 02/06/22 0355 02/07/22 0408 02/08/22 0348  NA 143 144 145 143  K 4.4 4.2 4.0 3.5  CL 110 115* 117* 116*  CO2 24 22 22 22   GLUCOSE 122* 93 94 97  BUN 33* 39* 37* 29*   CALCIUM 8.5* 8.2* 8.3* 8.1*  CREATININE 1.88* 1.87* 1.66* 1.25*  GFRNONAA 37* 37* 42* 60*    LIVER FUNCTION TESTS: Recent Labs    01/28/22 1026 02/03/22 1350 02/04/22 1622 02/05/22 0944  BILITOT 0.4 0.6 0.4 0.5  AST 28 28 41 47*  ALT 26 27 32 36  ALKPHOS 148* 127* 122 100  PROT 6.7 7.1 6.8 6.1*  ALBUMIN 3.4* 3.3* 3.0* 2.6*    Assessment and Plan: Patient known to IR service from left thoracentesis on 02/05/2022.  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  Recently admitted to [facility] with weakness , diarrhea, fever ,elevated creatinine. Acinetobacter was noted on 1 blood cx from 8/11. Latest blood cx neg to date. WBC nl. Pt afebrile.  He was originally scheduled for port a cath placement today as OP. Request now received for port placement as IP.Risks and benefits of image guided port-a-catheter placement was discussed with the patient/spouse including, but not limited to bleeding, infection, pneumothorax, or fibrin sheath development and need for additional procedures.  All of the patient's  questions were answered, patient is agreeable to proceed. Consent signed and in chart. Procedure scheduled for later today(pt ate breakfast this am)    Electronically Signed: D. Rowe Robert, PA-C 02/08/2022, 9:16 AM   I spent a total of  25 minutes at the the patient's bedside AND on the patient's hospital floor or unit, greater than 50% of which was counseling/coordinating care for port a cath placement    Patient ID: Drew Harrington, male   DOB: 01-13-1946, 76 y.o.   MRN: [id number]

## 2022-02-08 NOTE — Progress Notes (Signed)
Pharmacy Antibiotic Note  Drew Harrington is a 76 y.o. male admitted on 02/04/2022 with bacteremia.  Pharmacy has been consulted for cefepime dosing.    Presented to ED on 02/04/22 w/ diarrhea and confusion s/p chemotherapy on 8/9. Patient had pleural effusion on admission. Patient has a hx of NSCLC, HTN, CKD, dementia, and PE. Patient was most recently treated w/ Merrem.  Today, 02/08/22 - day 4 of abx - afebrile, WBC WNL and trending down - SCr elevated at 1.25 - Bcx collected on 8/10 notes acinetobacter Lwoffi  Plan - initiate cefepime 2g Q12H IV - Monitor renal function, CBC, BCx, temp. and clinical improvement - Duration of therapy 7 days  Height: 5\' 9"  (175.3 cm) Weight: 78.3 kg (172 lb 9.9 oz) IBW/kg (Calculated) : 70.7  Temp (24hrs), Avg:98.3 F (36.8 C), Min:98.1 F (36.7 C), Max:98.4 F (36.9 C)  Recent Labs  Lab 02/03/22 1327 02/03/22 1350 02/04/22 1510 02/04/22 1511 02/04/22 1622 02/04/22 1711 02/05/22 0944 02/06/22 0355 02/07/22 0408 02/08/22 0348  WBC 9.1  --  9.5  --   --   --  10.8* 6.4 5.2  --   CREATININE  --    < >  --   --  1.96*  --  1.88* 1.87* 1.66* 1.25*  LATICACIDVEN  --   --   --  2.8*  --  1.9  --   --   --   --    < > = values in this interval not displayed.    Estimated Creatinine Clearance: 50.3 mL/min (A) (by C-G formula based on SCr of 1.25 mg/dL (H)).    Allergies  Allergen Reactions   Sildenafil Other (See Comments)    Caused a headache    Antimicrobials this admission: 8/11 azithromycin >> d/c 8/13 8/13 meropenem >> d/c 8/14 8/11 ceftriaxone >> d/c 8/11 8/11 cefepime >> d/c 8/13  Microbiology results: 8/10 BCx: Acinetobacter Lwoffi  8/12 Bcx: NGTD 2 days 8/11 Pleural fluid: NGTD 3 days  Thank you for allowing pharmacy to be a part of this patient's care.  Barnet Pall PharmD Candidate 02/08/2022 12:51 PM

## 2022-02-08 NOTE — Care Management Important Message (Signed)
Important Message  Patient Details IM Letter given to the Patient. Name: Drew Harrington MRN: [id number] Date of Birth: 1945-12-26   Medicare Important Message Given:  Yes     Kerin Salen 02/08/2022, 12:25 PM

## 2022-02-08 NOTE — Progress Notes (Signed)
PHARMACY NOTE:  ANTIMICROBIAL RENAL DOSAGE ADJUSTMENT  Current antimicrobial regimen includes a mismatch between antimicrobial dosage and estimated renal function.  As per policy approved by the Pharmacy & Therapeutics and Medical Executive Committees, the antimicrobial dosage will be adjusted accordingly.  Current antimicrobial dosage:  Meropenem 1 g IV q12h  Indication: Acinetobacter lwoffii bacteremia  Renal Function:  Estimated Creatinine Clearance: 50.3 mL/min (A) (by C-G formula based on SCr of 1.25 mg/dL (H)). []      On intermittent HD, scheduled: []      On CRRT    Antimicrobial dosage has been changed to:  Meropenem 1g IV q8h  [name], PharmD 02/08/22 8:33 AM

## 2022-02-08 NOTE — Plan of Care (Signed)
  Problem: Education: Goal: Knowledge of General Education information will improve Description Including pain rating scale, medication(s)/side effects and non-pharmacologic comfort measures Outcome: Progressing   Problem: Health Behavior/Discharge Planning: Goal: Ability to manage health-related needs will improve Outcome: Progressing   

## 2022-02-09 DIAGNOSIS — G9340 Encephalopathy, unspecified: Secondary | ICD-10-CM | POA: Diagnosis not present

## 2022-02-09 DIAGNOSIS — R6520 Severe sepsis without septic shock: Secondary | ICD-10-CM | POA: Diagnosis not present

## 2022-02-09 DIAGNOSIS — A419 Sepsis, unspecified organism: Secondary | ICD-10-CM | POA: Diagnosis not present

## 2022-02-09 LAB — GLUCOSE, CAPILLARY
Glucose-Capillary: 87 mg/dL (ref 70–99)
Glucose-Capillary: 97 mg/dL (ref 70–99)
Glucose-Capillary: 109 mg/dL — ABNORMAL HIGH (ref 70–99)
Glucose-Capillary: 94 mg/dL (ref 70–99)

## 2022-02-09 LAB — CULTURE, BLOOD (ROUTINE X 2)
Culture: NO GROWTH
Special Requests: ADEQUATE

## 2022-02-09 MED ORDER — CHLORHEXIDINE GLUCONATE CLOTH 2 % EX PADS
6.0000 | MEDICATED_PAD | Freq: Every day | CUTANEOUS | Status: DC
  Administered 2022-02-09 – 2022-02-19 (×11): 6 via TOPICAL

## 2022-02-09 NOTE — Progress Notes (Signed)
Physical Therapy Treatment Patient Details Name: Drew Harrington MRN: [id number] DOB: Aug 10, 1945 Today's Date: 02/09/2022   History of Present Illness Pt is 76 yo male who presents with diarrhea  and weakness. PMH includes stage IVB non-small cell lung cancer, adenocarcinoma with large apical LUL mass, with mediastinal, right cervical and bilateral adrenal metastases, PE involving large right main on Lovenox (dx 12/2021), HTN, dementia, CKD 3a. He was also found to have large left pleural effusion, s/p left thoracentesis on 8/11.    PT Comments    Pt is progressing well with mobility, he tolerated increased ambulation distance of 150' with RW, SaO2 97% on 3L o2.    Recommendations for follow up therapy are one component of a multi-disciplinary discharge planning process, led by the attending physician.  Recommendations may be updated based on patient status, additional functional criteria and insurance authorization.  Follow Up Recommendations  Home health PT     Assistance Recommended at Discharge    Patient can return home with the following A little help with walking and/or transfers;A little help with bathing/dressing/bathroom;Assistance with cooking/housework;Assist for transportation;Help with stairs or ramp for entrance;Direct supervision/assist for financial management   Equipment Recommendations       Recommendations for Other Services       Precautions / Restrictions Precautions Precautions: Fall Precaution Comments: monitor O2 Restrictions Weight Bearing Restrictions: No     Mobility  Bed Mobility               General bed mobility comments: oob in recliner    Transfers Overall transfer level: Needs assistance Equipment used: Rolling walker (2 wheels) Transfers: Sit to/from Stand Sit to Stand: Supervision           General transfer comment: Cues for safety. Increased time    Ambulation/Gait Ambulation/Gait assistance: Supervision Gait Distance  (Feet): 150 Feet Assistive device: Rolling walker (2 wheels) Gait Pattern/deviations: Step-through pattern, Decreased stride length Gait velocity: WFL     General Gait Details: steady, no loss of balance, SaO2 97% on 3L walking   Stairs             Wheelchair Mobility    Modified Rankin (Stroke Patients Only)       Balance Overall balance assessment: Needs assistance         Standing balance support: During functional activity, Single extremity supported Standing balance-Leahy Scale: Fair                              Cognition Arousal/Alertness: Awake/alert Behavior During Therapy: Flat affect Overall Cognitive Status: History of cognitive impairments - at baseline                                 General Comments: able to follow one step commands needs cues for sequencing through tasks.        Exercises      General Comments        Pertinent Vitals/Pain Pain Assessment Pain Assessment: No/denies pain    Home Living                          Prior Function            PT Goals (current goals can now be found in the care plan section) Acute Rehab PT Goals Patient Stated Goal: home! PT Goal Formulation: With  patient/family Time For Goal Achievement: 02/01/22 Potential to Achieve Goals: Good Progress towards PT goals: Progressing toward goals    Frequency    Min 3X/week      PT Plan Current plan remains appropriate    Co-evaluation              AM-PAC PT "6 Clicks" Mobility   Outcome Measure    Help needed moving from lying on your back to sitting on the side of a flat bed without using bedrails?: A Little Help needed moving to and from a bed to a chair (including a wheelchair)?: A Little Help needed standing up from a chair using your arms (e.g., wheelchair or bedside chair)?: A Little Help needed to walk in hospital room?: A Little Help needed climbing 3-5 steps with a railing? : A  Little 6 Click Score: 15    End of Session Equipment Utilized During Treatment: Gait belt;Oxygen Activity Tolerance: Patient tolerated treatment well Patient left: in chair;with call bell/phone within reach;with chair alarm set;with family/visitor present Nurse Communication: Mobility status PT Visit Diagnosis: Unsteadiness on feet (R26.81);Muscle weakness (generalized) (M62.81)     Time: 5462-7035 PT Time Calculation (min) (ACUTE ONLY): 10 min  Charges:  $Gait Training: 8-22 mins                     Blondell Reveal Kistler PT 02/09/2022  Acute Rehabilitation Services  Office [phone]

## 2022-02-09 NOTE — TOC Progression Note (Addendum)
Transition of Care [facility]) - Progression Note    Patient Details  Name: Drew Harrington MRN: [id number] Date of Birth: 21-Apr-1946  Transition of Care [facility]) CM/SW [place], RN Phone Number: 02/09/2022, 10:29 AM  Clinical Narrative:   RNCM spoke with [facility] regarding DME: hospital bed, bedside commode, over bed tray, shower chair. [employer] received a call back from patient's wife after 5pm yesterday, however will continue to attempt to reach her. TOC will continue to follow  -12:50p RNCM spoke with [name] regarding DME supplier. RNCM making referrals to multiple DME suppliers as patient's wife has declined [facility] services.   -4:15p: RNCM spoke with patient and his wife at bedside, to advise unable to get another DME supplier to provide requested DME (hospital bed, bedside commode, overbed tray) pt has a shower chair. Drew Harrington is in agreement to receive DME from [employer]. This RNCM left voicemail with [employer] for call back.  TOC will continue to follow.  Expected Discharge Plan: [place] Barriers to Discharge: Continued Medical Work up  Expected Discharge Plan and Services Expected Discharge Plan: [place] In-house Referral: NA Discharge Planning Services: CM Consult   Living arrangements for the past 2 months: [place]                 DME Arranged: Bedside commode, Hospital bed, Overbed table, Shower stool DME Agency: [employer] Date DME Agency Contacted: 02/09/22   Representative spoke with at DME Agency: Gerre Couch             Social Determinants of Health (SDOH) Interventions    Readmission Risk Interventions     No data to display

## 2022-02-09 NOTE — Progress Notes (Signed)
PROGRESS NOTE    Drew Harrington  EUM:[id number] DOB: 11/13/1945 DOA: 02/04/2022 PCP: Hoyt Koch, MD     Brief Narrative:  Drew Harrington is a 76 y.o. male with medical history significant of stage IVB non-small cell lung cancer, adenocarcinoma with large apical LUL mass, with mediastinal, right cervical and bilateral adrenal metastases, PE involving large right main on Lovenox (dx 12/2021), HTN, dementia, CKD 3a who presents with diarrhea  and weakness.  Patient just received first session of chemotherapy on 8/9 and then started to have diarrhea.  He slipped in the bathtub while trying to wash out and subsequently was sent to ED.  In the ED, patient had a fever, was tachycardic and tachypneic.  With elevated creatinine.  CT head and cervical spine were negative for injuries.  He was also found to have large left pleural effusion.  His Lovenox was switched to IV heparin in anticipation for thoracentesis.  Patient underwent thoracentesis 8/11 which removed 1.1 L fluid. Hospitalization further complicated by blood culture +Acinectobacter.   New events last 24 hours / Subjective: No new complaints or symptoms.   Assessment & Plan:   Principal Problem:   Sepsis ([place]) Active Problems:   HTN (hypertension)   DM2 (diabetes mellitus, type 2) (HCC)   Adenocarcinoma of left lung, stage 4 (HCC)   Pleural effusion   History of pulmonary embolus (PE)   CAP (community acquired pneumonia)   Acute kidney injury superimposed on chronic kidney disease ([place])   Acute metabolic encephalopathy   Severe sepsis secondary to pneumonia -Sepsis present on admission with fever, tachycardia, tachypnea with endorgan damage including AKI and respiratory failure  -Cefepime   Acinectobacter bacteremia -Found on 1 aerobic bottle 8/10  -Repeat blood cultures 8/12 negative    -Cefepime, azithromycin --> Discussed with Dr. West Bali ID 8/13 and recommended to change to Merrem. Discussed with Dr. Candiss Norse ID  8/14 and noted cefepime is ok, treat for 7 days. Awaiting further susceptibility testing - pending.   Large left pleural effusion -Status post thoracentesis 8/11 which revealed 1.1 L fluid.  Fluid is exudative in nature   AKI on CKD 3a  -Baseline creatinine 1.4 -Resolved   Acute metabolic encephalopathy -Insetting of illnesses as above.  Resolved  History of PE -Lovenox  Stage IV adenocarcinoma left lung -Followed by Dr. Julien Nordmann -Currently undergoing systemic chemotherapy -IR to place port 8/14 -Palliative care consulted   Diabetes mellitus type 2 -a1c 6.3 -Sliding scale insulin  Hypertension -Losartan/HCTZ on hold due to AKI -Toprol   DVT prophylaxis: Lovenox   Code Status: DNR Family Communication: Wife over the phone 8/14 Disposition Plan:  Status is: Inpatient Remains inpatient appropriate because: IV antibiotics   Antimicrobials:  Anti-infectives (From admission, onward)    Start     Dose/Rate Route Frequency Ordered Stop   02/08/22 2200  ceFEPIme (MAXIPIME) 2 g in sodium chloride 0.9 % 100 mL IVPB        2 g 200 mL/hr over 30 Minutes Intravenous Every 12 hours 02/08/22 1317 02/13/22 0959   02/08/22 0930  meropenem (MERREM) 1 g in sodium chloride 0.9 % 100 mL IVPB  Status:  Discontinued        1 g 200 mL/hr over 30 Minutes Intravenous Every 8 hours 02/08/22 0830 02/08/22 1235   02/07/22 1430  meropenem (MERREM) 1 g in sodium chloride 0.9 % 100 mL IVPB  Status:  Discontinued        1 g 200 mL/hr over 30 Minutes  Intravenous Every 12 hours 02/07/22 1337 02/08/22 0830   02/05/22 2200  ceFEPIme (MAXIPIME) 2 g in sodium chloride 0.9 % 100 mL IVPB  Status:  Discontinued        2 g 200 mL/hr over 30 Minutes Intravenous Every 12 hours 02/05/22 1738 02/07/22 1337   02/05/22 1800  cefTRIAXone (ROCEPHIN) 2 g in sodium chloride 0.9 % 100 mL IVPB  Status:  Discontinued        2 g 200 mL/hr over 30 Minutes Intravenous Every 24 hours 02/04/22 2026 02/05/22 1738   02/05/22  1800  azithromycin (ZITHROMAX) 500 mg in sodium chloride 0.9 % 250 mL IVPB  Status:  Discontinued        500 mg 250 mL/hr over 60 Minutes Intravenous Every 24 hours 02/04/22 2026 02/07/22 1338   02/04/22 1800  cefTRIAXone (ROCEPHIN) 1 g in sodium chloride 0.9 % 100 mL IVPB        1 g 200 mL/hr over 30 Minutes Intravenous  Once 02/04/22 1757 02/04/22 2020   02/04/22 1800  azithromycin (ZITHROMAX) 500 mg in sodium chloride 0.9 % 250 mL IVPB        500 mg 250 mL/hr over 60 Minutes Intravenous  Once 02/04/22 1757 02/04/22 2146        Objective: Vitals:   02/08/22 2005 02/09/22 0500 02/09/22 1056 02/09/22 1243  BP: (!) 143/87 (!) 167/96 120/87 135/89  Pulse: 99 90 99 98  Resp: _0 Temp: 98.8 F (37.1 C) 98.3 F (36.8 C)  98.1 F (36.7 C)  TempSrc: Oral Oral  Oral  SpO2: 98% 95%  100%  Weight:      Height:        Intake/Output Summary (Last 24 hours) at 02/09/2022 1341 Last data filed at 02/09/2022 8889 Gross per 24 hour  Intake 100 ml  Output 2125 ml  Net -2025 ml    Filed Weights   02/04/22 2050 02/08/22 1702  Weight: 78.3 kg 75.8 kg    Examination:  General exam: Appears calm and comfortable  Respiratory system: Clear to auscultation bilaterally, without respiratory distress, on nasal cannula O2 Cardiovascular system: S1 & S2 heard, RRR. No murmurs.  Gastrointestinal system: Abdomen is nondistended, soft and nontender. Normal bowel sounds heard. Central nervous system: Alert Extremities: Symmetric in appearance  Skin: No rashes, lesions or ulcers on exposed skin    Data Reviewed: I have personally reviewed following labs and imaging studies  CBC: Recent Labs  Lab 02/03/22 1327 02/04/22 1510 02/05/22 0944 02/06/22 0355 02/07/22 0408  WBC 9.1 9.5 10.8* 6.4 5.2  NEUTROABS 7.4 8.1*  --   --   --   HGB 15.3 15.5 14.4 13.3 12.7*  HCT 44.5 49.0 45.4 43.7 39.8  MCV 93.5 97.2 98.9 101.9* 97.5  PLT 290 286 224 187 169    Basic Metabolic Panel: Recent  Labs  Lab 02/04/22 1622 02/05/22 0944 02/06/22 0355 02/07/22 0408 02/08/22 0348  NA 145 143 144 145 143  K 4.4 4.4 4.2 4.0 3.5  CL 110 110 115* 117* 116*  CO2 _1 GLUCOSE 130* 122* 93 94 97  BUN 28* 33* 39* 37* 29*  CREATININE 1.96* 1.88* 1.87* 1.66* 1.25*  CALCIUM 9.0 8.5* 8.2* 8.3* 8.1*  MG 2.3  --   --   --   --   PHOS 3.7  --   --   --   --     GFR: Estimated Creatinine Clearance: 50.3  mL/min (A) (by C-G formula based on SCr of 1.25 mg/dL (H)). Liver Function Tests: Recent Labs  Lab 02/03/22 1350 02/04/22 1622 02/05/22 0944  AST 28 41 47*  ALT 27 32 36  ALKPHOS 127* 122 100  BILITOT 0.6 0.4 0.5  PROT 7.1 6.8 6.1*  ALBUMIN 3.3* 3.0* 2.6*    Recent Labs  Lab 02/04/22 1622  LIPASE 25    No results for input(s): "AMMONIA" in the last 168 hours. Coagulation Profile: Recent Labs  Lab 02/04/22 1510  INR 1.7*    Cardiac Enzymes: No results for input(s): "CKTOTAL", "CKMB", "CKMBINDEX", "TROPONINI" in the last 168 hours. BNP (last 3 results) No results for input(s): "PROBNP" in the last 8760 hours. HbA1C: No results for input(s): "HGBA1C" in the last 72 hours. CBG: Recent Labs  Lab 02/08/22 1139 02/08/22 1718 02/08/22 2002 02/09/22 0725 02/09/22 1109  GLUCAP 92 82 91 87 97    Lipid Profile: No results for input(s): "CHOL", "HDL", "LDLCALC", "TRIG", "CHOLHDL", "LDLDIRECT" in the last 72 hours. Thyroid Function Tests: No results for input(s): "TSH", "T4TOTAL", "FREET4", "T3FREE", "THYROIDAB" in the last 72 hours. Anemia Panel: No results for input(s): "VITAMINB12", "FOLATE", "FERRITIN", "TIBC", "IRON", "RETICCTPCT" in the last 72 hours. Sepsis Labs: Recent Labs  Lab 02/04/22 1511 02/04/22 1711  LATICACIDVEN 2.8* 1.9     Recent Results (from the past 240 hour(s))  Susceptibility, Aer + Anaerob     Status: Abnormal   Collection Time: 02/04/22  1:34 PM  Result Value Ref Range Status   Suscept, Aer + Anaerob Preliminary report (A)   Final    Comment: (NOTE) Performed At: [facility] [place], [place] [id number] Rush Farmer MD NW:[id number]    Source of Sample [id number]  Final    Comment: Performed at [facility], [place] [address] [place], [place] [address]  Susceptibility Result     Status: Abnormal   Collection Time: 02/04/22  1:34 PM  Result Value Ref Range Status   Suscept Result 1 Acinetobacter lwoffii (A)  Final    Comment: (NOTE) Identification performed by account, not confirmed by this laboratory. Performed At: [facility] [place], [place] [id number] Rush Farmer MD WN:[id number]   Culture, blood (routine x 2)     Status: Abnormal (Preliminary result)   Collection Time: 02/04/22  5:30 PM   Specimen: BLOOD  Result Value Ref Range Status   Specimen Description   Final    BLOOD LEFT ANTECUBITAL Performed at [facility] [address], [place], [place] [address]    Special Requests   Final    BOTTLES DRAWN AEROBIC AND ANAEROBIC Blood Culture adequate volume Performed at [facility] [address], [place], [place] [address]    Culture  Setup Time   Final    GRAM NEGATIVE RODS IN BOTH AEROBIC AND ANAEROBIC BOTTLES CRITICAL RESULT CALLED TO, READ BACK BY AND VERIFIED WITH: PHARMD MARY ASHLYN ON 02/05/22 1723 BY DRT    Culture (A)  Final    ACINETOBACTER LWOFFII Sent to [place] for further susceptibility testing. Performed at [facility], [place] [address], [place], [place] [address]    Report Status PENDING  Incomplete  Blood Culture ID Panel (Reflexed)     Status: None   Collection Time: 02/04/22  5:30 PM  Result Value Ref Range Status   Enterococcus faecalis NOT DETECTED NOT DETECTED Final   Enterococcus Faecium NOT DETECTED NOT DETECTED Final   Listeria monocytogenes  NOT DETECTED NOT DETECTED Final   Staphylococcus species NOT DETECTED NOT DETECTED Final   Staphylococcus aureus (BCID) NOT  DETECTED NOT DETECTED Final   Staphylococcus epidermidis NOT DETECTED NOT DETECTED Final   Staphylococcus lugdunensis NOT DETECTED NOT DETECTED Final   Streptococcus species NOT DETECTED NOT DETECTED Final   Streptococcus agalactiae NOT DETECTED NOT DETECTED Final   Streptococcus pneumoniae NOT DETECTED NOT DETECTED Final   Streptococcus pyogenes NOT DETECTED NOT DETECTED Final   A.calcoaceticus-baumannii NOT DETECTED NOT DETECTED Final   Bacteroides fragilis NOT DETECTED NOT DETECTED Final   Enterobacterales NOT DETECTED NOT DETECTED Final   Enterobacter cloacae complex NOT DETECTED NOT DETECTED Final   Escherichia coli NOT DETECTED NOT DETECTED Final   Klebsiella aerogenes NOT DETECTED NOT DETECTED Final   Klebsiella oxytoca NOT DETECTED NOT DETECTED Final   Klebsiella pneumoniae NOT DETECTED NOT DETECTED Final   Proteus species NOT DETECTED NOT DETECTED Final   Salmonella species NOT DETECTED NOT DETECTED Final   Serratia marcescens NOT DETECTED NOT DETECTED Final   Haemophilus influenzae NOT DETECTED NOT DETECTED Final   Neisseria meningitidis NOT DETECTED NOT DETECTED Final   Pseudomonas aeruginosa NOT DETECTED NOT DETECTED Final   Stenotrophomonas maltophilia NOT DETECTED NOT DETECTED Final   Candida albicans NOT DETECTED NOT DETECTED Final   Candida auris NOT DETECTED NOT DETECTED Final   Candida glabrata NOT DETECTED NOT DETECTED Final   Candida krusei NOT DETECTED NOT DETECTED Final   Candida parapsilosis NOT DETECTED NOT DETECTED Final   Candida tropicalis NOT DETECTED NOT DETECTED Final   Cryptococcus neoformans/gattii NOT DETECTED NOT DETECTED Final    Comment: Performed at [facility], [address] [place], [place] [address]  Culture, blood (routine x 2)     Status: None   Collection Time: 02/04/22  5:58 PM   Specimen: BLOOD  Result Value Ref Range Status   Specimen Description   Final    BLOOD BLOOD LEFT ARM Performed at [facility] [address] [place], [place] [address]    Special Requests   Final    BOTTLES DRAWN AEROBIC AND ANAEROBIC Blood Culture adequate volume Performed at [facility] [address] [place], [place] [address]    Culture   Final    NO GROWTH 5 DAYS Performed at [facility], [place] [address] [place], [place] [address]    Report Status 02/09/2022 FINAL  Final  Culture, body fluid w Gram Stain-bottle     Status: None (Preliminary result)   Collection Time: 02/05/22  1:46 PM   Specimen: Fluid  Result Value Ref Range Status   Specimen Description FLUID PLEURAL  Final   Special Requests BOTTLES DRAWN AEROBIC AND ANAEROBIC  Final   Culture   Final    NO GROWTH 4 DAYS Performed at [facility], [place] [address], [place], [place] [address]    Report Status PENDING  Incomplete  Gram stain     Status: None   Collection Time: 02/05/22  1:46 PM   Specimen: BLOOD  Result Value Ref Range Status   Specimen Description BLOOD PLEURAL  Final   Special Requests NONE  Final   Gram Stain   Final    CYTOSPIN SMEAR NO ORGANISMS SEEN NO WBC SEEN Performed at [facility], [place] [address], [place], [place] [address]    Report Status 02/05/2022 FINAL  Final  Culture, blood (Routine X 2) w Reflex to ID Panel  Status: None (Preliminary result)   Collection Time: 02/06/22  1:41 PM   Specimen: BLOOD  Result Value Ref Range Status   Specimen Description   Final    BLOOD RIGHT ANTECUBITAL Performed at [facility] [address], [place], [place] [address]    Special Requests   Final    BOTTLES DRAWN AEROBIC AND ANAEROBIC Blood Culture adequate volume Performed at [facility] [address], [place], [place] [address]    Culture   Final    NO GROWTH 3 DAYS Performed at [facility], [place] [address] [place], [place] [address]    Report Status PENDING  Incomplete  Culture, blood (Routine X 2) w Reflex to ID Panel      Status: None (Preliminary result)   Collection Time: 02/06/22  1:52 PM   Specimen: BLOOD  Result Value Ref Range Status   Specimen Description   Final    BLOOD BLOOD LEFT HAND Performed at [facility] [address] [place], [place] [address]    Special Requests   Final    BOTTLES DRAWN AEROBIC AND ANAEROBIC Blood Culture adequate volume Performed at [facility] [address] [place], [place] [address]    Culture   Final    NO GROWTH 3 DAYS Performed at [facility], [place] [address] [place], [place] [address]    Report Status PENDING  Incomplete      Radiology Studies: IR IMAGING GUIDED PORT INSERTION  Result Date: 02/08/2022 INDICATION: LEFT lung cancer. EXAM: IMPLANTED PORT A CATH PLACEMENT WITH ULTRASOUND AND FLUOROSCOPIC GUIDANCE MEDICATIONS: None ANESTHESIA/SEDATION: Moderate (conscious) sedation was employed during this procedure. A total of Versed 1 mg and Fentanyl 100 mcg was administered intravenously. Moderate Sedation Time: 36 minutes. The patient's level of consciousness and vital signs were monitored continuously by radiology nursing throughout the procedure under my direct supervision. FLUOROSCOPY TIME:  Fluoroscopic dose; 1 mGy COMPLICATIONS: None immediate. PROCEDURE: The procedure, risks, benefits, and alternatives were explained to the patient. Questions regarding the procedure were encouraged and answered. The patient understands and consents to the procedure. The RIGHT neck and chest were prepped with chlorhexidine in a sterile fashion, and a sterile drape was applied covering the operative field. Maximum barrier sterile technique with sterile gowns and gloves were used for the procedure. A timeout was performed prior to the initiation of the procedure. Local anesthesia was provided with 1% lidocaine with epinephrine. After creating a small venotomy incision, a micropuncture kit was utilized to access the external jugular  vein under direct, real-time ultrasound guidance. Ultrasound image documentation was performed. The microwire was kinked to measure appropriate catheter length. A subcutaneous port pocket was then created along the upper chest wall utilizing a combination of sharp and blunt dissection. The pocket was irrigated with sterile saline. A single lumen ISP power injectable port was chosen for placement. The 8 Fr catheter was tunneled from the port pocket site to the venotomy incision. The port was placed in the pocket. The external catheter was trimmed to appropriate length. At the venotomy, an 8 Fr peel-away sheath was placed over a guidewire under fluoroscopic guidance. The catheter was then placed through the sheath and the sheath was removed. Final catheter positioning was confirmed and documented with a fluoroscopic spot radiograph. The port was accessed with a Huber needle, aspirated and flushed with heparinized saline. The port pocket incision was closed with interrupted 3-0 Vicryl suture then Dermabond was applied,  including at the venotomy incision. Dressings were placed. The patient tolerated the procedure well without immediate post procedural complication. FINDINGS: Thrombosed RIGHT internal jugular vein. Patent RIGHT external jugular vein and EJ-subclavian confluence. IMPRESSION: Successful placement of a RIGHT external jugular approach power injectable Port-A-Cath. The tip of the catheter is positioned within the proximal RIGHT atrium. The catheter is ready for immediate use. Michaelle Birks, MD [facility] Radiology Electronically Signed   By: Michaelle Birks M.D.   On: 02/08/2022 17:08      Scheduled Meds:  Chlorhexidine Gluconate Cloth  6 each Topical Daily   enoxaparin (LOVENOX) injection  70 mg Subcutaneous Q12H   feeding supplement  237 mL Oral BID BM   insulin aspart  0-9 Units Subcutaneous TID WC   metoprolol succinate  25 mg Oral Daily   Continuous  Infusions:  ceFEPime (MAXIPIME) IV 2 g (02/09/22 1059)     LOS: 5 days     Dessa Phi, DO [facility] 02/09/2022, 1:41 PM   Available via Epic secure chat 7am-7pm After these hours, please refer to coverage provider listed on [email or web address]

## 2022-02-09 NOTE — Plan of Care (Signed)
  Problem: Education: Goal: Knowledge of General Education information will improve Description Including pain rating scale, medication(s)/side effects and non-pharmacologic comfort measures Outcome: Progressing   Problem: Health Behavior/Discharge Planning: Goal: Ability to manage health-related needs will improve Outcome: Progressing   

## 2022-02-09 NOTE — Progress Notes (Signed)
Occupational Therapy Treatment Patient Details Name: Drew Harrington MRN: [id number] DOB: 08-May-1946 Today's Date: 02/09/2022   History of present illness Pt is 76 yo male who presents with diarrhea  and weakness. PMH includes stage IVB non-small cell lung cancer, adenocarcinoma with large apical LUL mass, with mediastinal, right cervical and bilateral adrenal metastases, PE involving large right main on Lovenox (dx 12/2021), HTN, dementia, CKD 3a. He was also found to have large left pleural effusion, s/p left thoracentesis on 8/11. patient underwent port placement on 8/14.   OT comments  Patient was educated on proper shower transfer techniques. Patient was able to engage in oral hygiene standing at sink with cues for proper RW placement. Patient was able to maintain O2 saturation on 4L/min 90% or higher during activity. Patient was educated on pulse ox and where to purchase. Patient reported he would look into one for home. Patient would continue to benefit from skilled OT services at this time while admitted to address noted deficits in order to improve overall safety and independence in ADLs.     Recommendations for follow up therapy are one component of a multi-disciplinary discharge planning process, led by the attending physician.  Recommendations may be updated based on patient status, additional functional criteria and insurance authorization.    Follow Up Recommendations  No OT follow up    Assistance Recommended at Discharge Intermittent Supervision/Assistance  Patient can return home with the following  A little help with walking and/or transfers;A little help with bathing/dressing/bathroom;Direct supervision/assist for medications management;Direct supervision/assist for financial management;Assist for transportation   Equipment Recommendations       Recommendations for Other Services      Precautions / Restrictions Precautions Precautions: Fall Precaution Comments: monitor  O2 Restrictions Weight Bearing Restrictions: No       Mobility Bed Mobility               General bed mobility comments: oob in recliner    Transfers                         Balance Overall balance assessment: Needs assistance         Standing balance support: During functional activity, Single extremity supported Standing balance-Leahy Scale: Fair                             ADL either performed or assessed with clinical judgement   ADL Overall ADL's : Needs assistance/impaired     Grooming: Min guard;Oral care;Wash/dry face;Standing Grooming Details (indicate cue type and reason): at sink in room with increased time and  cues for proper positioning.                           Tub/Shower Transfer Details (indicate cue type and reason): patient was visually and verbally eduacted on how to complete shower transfers on this date in bathroom. patient declined to trial at this time. Functional mobility during ADLs: Min guard;Rolling walker (2 wheels) General ADL Comments: Pt up to recliner this session.    Extremity/Trunk Assessment              Vision       Perception     Praxis      Cognition Arousal/Alertness: Awake/alert Behavior During Therapy: Flat affect Overall Cognitive Status: History of cognitive impairments - at baseline  General Comments: able to follow one step commands needs cues for sequencing through tasks.        Exercises      Shoulder Instructions       General Comments      Pertinent Vitals/ Pain       Pain Assessment Pain Assessment: No/denies pain  Home Living                                          Prior Functioning/Environment              Frequency  Min 2X/week        Progress Toward Goals  OT Goals(current goals can now be found in the care plan section)  Progress towards OT goals: Progressing toward  goals     Plan Discharge plan remains appropriate    Co-evaluation                 AM-PAC OT "6 Clicks" Daily Activity     Outcome Measure   Help from another person eating meals?: None Help from another person taking care of personal grooming?: None Help from another person toileting, which includes using toliet, bedpan, or urinal?: A Little Help from another person bathing (including washing, rinsing, drying)?: A Little Help from another person to put on and taking off regular upper body clothing?: None Help from another person to put on and taking off regular lower body clothing?: None 6 Click Score: 22    End of Session Equipment Utilized During Treatment: Oxygen;Rolling walker (2 wheels)  OT Visit Diagnosis: Unsteadiness on feet (R26.81);Muscle weakness (generalized) (M62.81)   Activity Tolerance Patient tolerated treatment well   Patient Left in chair;with call bell/phone within reach;with chair alarm set   Nurse Communication Mobility status        Time: 8206-0156 OT Time Calculation (min): 24 min  Charges: OT General Charges $OT Visit: 1 Visit OT Treatments $Self Care/Home Management : 23-37 mins  Drew Harrington OTR/L, MS Acute Rehabilitation Department Office# [phone] Pager# [phone]   Drew Harrington 02/09/2022, 10:33 AM

## 2022-02-10 ENCOUNTER — Other Ambulatory Visit: Payer: Self-pay

## 2022-02-10 DIAGNOSIS — C3492 Malignant neoplasm of unspecified part of left bronchus or lung: Secondary | ICD-10-CM | POA: Diagnosis not present

## 2022-02-10 DIAGNOSIS — R7881 Bacteremia: Secondary | ICD-10-CM | POA: Diagnosis not present

## 2022-02-10 DIAGNOSIS — J189 Pneumonia, unspecified organism: Secondary | ICD-10-CM | POA: Diagnosis not present

## 2022-02-10 LAB — CULTURE, BODY FLUID W GRAM STAIN -BOTTLE: Culture: NO GROWTH

## 2022-02-10 LAB — GLUCOSE, CAPILLARY
Glucose-Capillary: 92 mg/dL (ref 70–99)
Glucose-Capillary: 80 mg/dL (ref 70–99)
Glucose-Capillary: 91 mg/dL (ref 70–99)
Glucose-Capillary: 105 mg/dL — ABNORMAL HIGH (ref 70–99)
Glucose-Capillary: 107 mg/dL — ABNORMAL HIGH (ref 70–99)

## 2022-02-10 LAB — DIFFERENTIAL
Abs Immature Granulocytes: 0.02 10*3/uL (ref 0.00–0.07)
Basophils Absolute: 0 10*3/uL (ref 0.0–0.1)
Basophils Relative: 2 %
Eosinophils Absolute: 0.1 10*3/uL (ref 0.0–0.5)
Eosinophils Relative: 8 %
Immature Granulocytes: 1 %
Lymphocytes Relative: 37 %
Lymphs Abs: 0.6 10*3/uL — ABNORMAL LOW (ref 0.7–4.0)
Monocytes Absolute: 0 10*3/uL — ABNORMAL LOW (ref 0.1–1.0)
Monocytes Relative: 2 %
Neutro Abs: 0.8 10*3/uL — ABNORMAL LOW (ref 1.7–7.7)
Neutrophils Relative %: 50 %

## 2022-02-10 LAB — CBC
HCT: 35 % — ABNORMAL LOW (ref 39.0–52.0)
Hemoglobin: 11.7 g/dL — ABNORMAL LOW (ref 13.0–17.0)
MCH: 31.2 pg (ref 26.0–34.0)
MCHC: 33.4 g/dL (ref 30.0–36.0)
MCV: 93.3 fL (ref 80.0–100.0)
Platelets: 107 10*3/uL — ABNORMAL LOW (ref 150–400)
RBC: 3.75 MIL/uL — ABNORMAL LOW (ref 4.22–5.81)
RDW: 15.8 % — ABNORMAL HIGH (ref 11.5–15.5)
WBC: 1.4 10*3/uL — CL (ref 4.0–10.5)
nRBC: 0 % (ref 0.0–0.2)

## 2022-02-10 LAB — BASIC METABOLIC PANEL
Anion gap: 6 (ref 5–15)
BUN: 24 mg/dL — ABNORMAL HIGH (ref 8–23)
CO2: 28 mmol/L (ref 22–32)
Calcium: 8.4 mg/dL — ABNORMAL LOW (ref 8.9–10.3)
Chloride: 110 mmol/L (ref 98–111)
Creatinine, Ser: 1.1 mg/dL (ref 0.61–1.24)
GFR, Estimated: >60 mL/min (ref >60)
Glucose, Bld: 92 mg/dL (ref 70–99)
Potassium: 3.3 mmol/L — ABNORMAL LOW (ref 3.5–5.1)
Sodium: 144 mmol/L (ref 135–145)

## 2022-02-10 MED ORDER — POTASSIUM CHLORIDE CRYS ER 20 MEQ PO TBCR
40.0000 meq | EXTENDED_RELEASE_TABLET | Freq: Once | ORAL | Status: AC
  Administered 2022-02-10: 40 meq via ORAL
  Filled 2022-02-10: qty 2

## 2022-02-10 MED ORDER — SODIUM CHLORIDE 0.9% FLUSH
10.0000 mL | Freq: Two times a day (BID) | INTRAVENOUS | Status: DC
  Administered 2022-02-10 – 2022-02-18 (×6): 10 mL

## 2022-02-10 MED ORDER — SODIUM CHLORIDE 0.9% FLUSH
10.0000 mL | INTRAVENOUS | Status: DC | PRN
  Administered 2022-02-13: 10 mL

## 2022-02-10 NOTE — Progress Notes (Signed)
Supervising Physician: Mir, Biochemist, clinical  Patient Status:  [facility] - In-pt  Chief Complaint:  Bleeding port  Subjective:  Port placed 2 days ago by IR Bleeding noticed on gauze this morning, appears to have continued to ooze as gauze under tegaderm had more blood this afternoon per IV RN Keri.  Called to assess site.  Allergies: Sildenafil  Medications: Prior to Admission medications   Medication Sig Start Date End Date Taking? Authorizing Provider  losartan-hydrochlorothiazide (HYZAAR) 100-25 MG tablet TAKE 1 TABLET BY MOUTH DAILY 01/12/22  Yes Hoyt Koch, MD  metFORMIN (GLUCOPHAGE) 1000 MG tablet TAKE 1 TABLET(1000 MG) BY MOUTH DAILY WITH BREAKFAST 01/21/22  Yes Hoyt Koch, MD  metoprolol succinate (TOPROL-XL) 25 MG 24 hr tablet Take 1 tablet by mouth in the morning and at bedtime. Patient taking differently: Take 25 mg by mouth daily. 01/19/22  Yes Kathie Dike, MD  potassium chloride SA (KLOR-CON) 20 MEQ tablet Take 1 tablet (20 mEq total) by mouth daily. 11/11/20  Yes Hoyt Koch, MD  primidone (MYSOLINE) 250 MG tablet Take 500 mg by mouth in the morning and at bedtime. 11/07/18  Yes [provider]  enoxaparin (LOVENOX) 80 MG/0.8ML injection Inject 0.7 mLs (70 mg total) into the skin every 12 hours. 01/19/22 03/20/22  Kathie Dike, MD  enoxaparin (LOVENOX) 80 MG/0.8ML injection Inject 0.7 mls into the skin every 12 hours 01/19/22   Kathie Dike, MD  folic acid (FOLVITE) 1 MG tablet Take 1 tablet (1 mg total) by mouth daily. 01/28/22   Curt Bears, MD  lidocaine-prilocaine (EMLA) cream Apply to the Port-A-Cath site 30-60-minutes before treatment 01/28/22   Curt Bears, MD  prochlorperazine (COMPAZINE) 10 MG tablet Take 1 tablet (10 mg total) by mouth every 6 (six) hours as needed for nausea or vomiting. 01/28/22   Curt Bears, MD  simvastatin (ZOCOR) 20 MG tablet TAKE 1 TABLET(20 MG) BY MOUTH AT BEDTIME Patient not taking: Reported  on 02/05/2022 07/02/21   Janith Lima, MD     Vital Signs: BP (!) 156/91 (BP Location: Right Arm)   Pulse 93   Temp 98 F (36.7 C) (Oral)   Resp 17   Ht _0  (1.753 m)   Wt 167 lb 1.7 oz (75.8 kg)   SpO2 100%   BMI 24.68 kg/m   Physical Exam Constitutional:      General: He is not in acute distress. HENT:     Head: Normocephalic and atraumatic.     Mouth/Throat:     Pharynx: Oropharynx is clear.  Eyes:     Extraocular Movements: Extraocular movements intact.  Cardiovascular:     Rate and Rhythm: Normal rate.  Pulmonary:     Effort: Pulmonary effort is normal.  Skin:    Comments: Right chest: Port site accessed with blood soaked gauze just above port, both covered by tegaderm. Under gauze, incision has glue with coagulated appearing blood underneath along the incision.  No active oozing.  Neurological:     Mental Status: He is alert.     Imaging: IR IMAGING GUIDED PORT INSERTION  Result Date: 02/08/2022 INDICATION: LEFT lung cancer. EXAM: IMPLANTED PORT A CATH PLACEMENT WITH ULTRASOUND AND FLUOROSCOPIC GUIDANCE MEDICATIONS: None ANESTHESIA/SEDATION: Moderate (conscious) sedation was employed during this procedure. A total of Versed 1 mg and Fentanyl 100 mcg was administered intravenously. Moderate Sedation Time: 36 minutes. The patient's level of consciousness and vital signs were monitored continuously by radiology nursing throughout the procedure under my direct supervision.  FLUOROSCOPY TIME:  Fluoroscopic dose; 1 mGy COMPLICATIONS: None immediate. PROCEDURE: The procedure, risks, benefits, and alternatives were explained to the patient. Questions regarding the procedure were encouraged and answered. The patient understands and consents to the procedure. The RIGHT neck and chest were prepped with chlorhexidine in a sterile fashion, and a sterile drape was applied covering the operative field. Maximum barrier sterile technique with sterile gowns and gloves were used for the  procedure. A timeout was performed prior to the initiation of the procedure. Local anesthesia was provided with 1% lidocaine with epinephrine. After creating a small venotomy incision, a micropuncture kit was utilized to access the external jugular vein under direct, real-time ultrasound guidance. Ultrasound image documentation was performed. The microwire was kinked to measure appropriate catheter length. A subcutaneous port pocket was then created along the upper chest wall utilizing a combination of sharp and blunt dissection. The pocket was irrigated with sterile saline. A single lumen ISP power injectable port was chosen for placement. The 8 Fr catheter was tunneled from the port pocket site to the venotomy incision. The port was placed in the pocket. The external catheter was trimmed to appropriate length. At the venotomy, an 8 Fr peel-away sheath was placed over a guidewire under fluoroscopic guidance. The catheter was then placed through the sheath and the sheath was removed. Final catheter positioning was confirmed and documented with a fluoroscopic spot radiograph. The port was accessed with a Huber needle, aspirated and flushed with heparinized saline. The port pocket incision was closed with interrupted 3-0 Vicryl suture then Dermabond was applied, including at the venotomy incision. Dressings were placed. The patient tolerated the procedure well without immediate post procedural complication. FINDINGS: Thrombosed RIGHT internal jugular vein. Patent RIGHT external jugular vein and EJ-subclavian confluence. IMPRESSION: Successful placement of a RIGHT external jugular approach power injectable Port-A-Cath. The tip of the catheter is positioned within the proximal RIGHT atrium. The catheter is ready for immediate use. Michaelle Birks, MD [facility] Radiology Electronically Signed   By: Michaelle Birks M.D.   On: 02/08/2022 17:08   DG CHEST PORT 1 VIEW  Result  Date: 02/06/2022 CLINICAL DATA:  Increased breath sounds EXAM: PORTABLE CHEST 1 VIEW COMPARISON:  02/05/2022 FINDINGS: Cardiac shadow is enlarged but stable. Mediastinum is again prominent similar to that seen on the prior exam consistent with the known left upper lobe mass lesion. Persistent left-sided pleural effusion is noted stable in appearance from the prior exam. No pneumothorax is seen. Right lung remains clear. No acute bony abnormality is noted. IMPRESSION: Persistent left-sided pleural effusions stable from the previous day. Known left upper lobe mass lesion medially. Electronically Signed   By: Inez Catalina M.D.   On: 02/06/2022 19:36    Labs:  CBC: Recent Labs    02/05/22 0944 02/06/22 0355 02/07/22 0408 02/10/22 0455  WBC 10.8* 6.4 5.2 1.4*  HGB 14.4 13.3 12.7* 11.7*  HCT 45.4 43.7 39.8 35.0*  PLT 224 187 228 107*    COAGS: Recent Labs    02/04/22 1510  INR 1.7*  APTT 22*    BMP: Recent Labs    02/06/22 0355 02/07/22 0408 02/08/22 0348 02/10/22 0455  NA 144 145 143 144  K 4.2 4.0 3.5 3.3*  CL 115* 117* 116* 110  CO2 _0 GLUCOSE 93 94 97 92  BUN 39* 37* 29* 24*  CALCIUM 8.2* 8.3* 8.1* 8.4*  CREATININE 1.87* 1.66* 1.25* 1.10  GFRNONAA 37* 42* 60* >  60    LIVER FUNCTION TESTS: Recent Labs    01/28/22 1026 02/03/22 1350 02/04/22 1622 02/05/22 0944  BILITOT 0.4 0.6 0.4 0.5  AST 28 28 41 47*  ALT 26 27 32 36  ALKPHOS 148* 127* 122 100  PROT 6.7 7.1 6.8 6.1*  ALBUMIN 3.4* 3.3* 3.0* 2.6*    Assessment and Plan:  Bleeding port site --port placed only 2 days ago --pt on 9m lovenox BID, this is likely contributing to bleeding --currently no bleeding --new dressing placed. --if bleeding continues, can consider DDAVP administration. --may change dressing as needed --IR available as needed.  Electronically Signed: HPasty Spillers PA 02/10/2022, 5:01 PM   I spent a total of 15 Minutes at the the patient's bedside AND on the patient's  hospital floor or unit, greater than 50% of which was counseling/coordinating care for bleeding at port site.

## 2022-02-10 NOTE — TOC Progression Note (Addendum)
Transition of Care [facility]) - Progression Note    Patient Details  Name: Drew Harrington MRN: [id number] Date of Birth: [birth year]  Transition of Care [facility]) CM/SW [place], RN Phone Number: 02/10/2022, 1:39 PM  Clinical Narrative:   DME supplies (hospital bed, bedside commode, overbed tray, pt's wife reports she has a shower chair) referred to [facility] with [employer], who will follow up with patient's wife.  Spoke with representative Anderson Malta with [facility] for HHPT/OT, HHA, who will follow patient.  This RNCM called patient's wife however unable to reach her, unable to leave a message. [place] agency and DME supplier attached to discharge paperwork.    - 3:48p This RNCM left voicemail for patient's wife [name] to give a call back.  TOC will continue to follow   Expected Discharge Plan: [place] to Discharge: Continued Medical Work up  Expected Discharge Plan and Services Expected Discharge Plan: [place] In-house Referral: NA Discharge Planning Services: CM Consult Post Acute Care Choice: Durable Medical Equipment, Home Health Living arrangements for the past 2 months: [place]                 DME Arranged: Bedside commode, Hospital bed, Overbed table, Shower stool DME Agency: [employer] Date DME Agency Contacted: 02/09/22   Representative spoke with at DME Agency: [facility] Arranged: PT, Nurse's Aide, OT, Social Work [employer] Agency: [facility] Date [place]: 02/08/22   Representative spoke with at [place]: [place] (SDOH) Interventions    Readmission Risk Interventions    02/10/2022   10:02 AM  Readmission Risk Prevention Plan  PCP or Specialist Appt within 3-5 Days Complete  HRI or [place] Complete  Social Work Consult for [place] Planning/Counseling Complete  Palliative Care Screening Not Applicable  Medication Review Designer, fashion/clothing) Complete

## 2022-02-10 NOTE — Progress Notes (Signed)
IV team consult for new blood inside port dressing (port inserted 8/14). Upon assessment, it seems there is a new, slow ooze coming from the surgical site. Biopatch is not saturated. No blood dripping down. However, more blood on gauze this afternoon. Contacted IR, spoke with Hildred Alamin. Stated she would come and assess. Feel free to contact IV team if further assistance is needed from [place].

## 2022-02-10 NOTE — Progress Notes (Addendum)
[facility] PROGRESS NOTE   Drew Harrington CIC:[id number] DOB: 08/28/1945 DOA: 02/04/2022  PCP: Myrlene Broker, MD  Brief History/Interval Summary: 76 y.o. male with medical history significant of stage IVB non-small cell lung cancer, adenocarcinoma with large apical LUL mass, with mediastinal, right cervical and bilateral adrenal metastases, PE involving large right main on Lovenox (dx 12/2021), HTN, dementia, CKD 3a who presents with diarrhea  and weakness.  Patient just received first session of chemotherapy on 8/9 and then started to have diarrhea.  He slipped in the bathtub while trying to wash out and subsequently was sent to ED.  In the ED, patient had a fever, was tachycardic and tachypneic.  With elevated creatinine.  CT head and cervical spine were negative for injuries.  He was also found to have large left pleural effusion.  His Lovenox was switched to IV heparin in anticipation for thoracentesis.  Patient underwent thoracentesis 8/11 which removed 1.1 L fluid. Hospitalization further complicated by blood culture +Acinectobacter.    Consultants: Phone discussion with ID  Procedures: Thoracentesis    Subjective/Interval History: Patient denies any nausea vomiting.  Occasional shortness of breath.  No chest pain.    Assessment/Plan:  Community-acquired pneumonia/severe sepsis present on admission/chronic respiratory failure with hypoxia Sepsis was present on admission with fever, tachycardia, tachypnea with endorgan damage including acute kidney injury and respiratory failure Respiratory status slowly improving.  He is on oxygen by nasal cannula which he also uses at home. Currently noted to be on cefepime.  See below.  Acinetobacter bacteremia Found in 1 aerobic bottle on 8/10.  Repeat cultures from 8/12 negative. Initially on cefepime and azithromycin.  Changed over initially to meropenem and then changed back to cefepime.  Plan is to treat for total of 7  days.  Susceptibility data is still pending.  Had to be sent out to [facility].  Large left pleural effusion Underwent thoracentesis on 8/11 with removal of 1.1 L of fluid.  Was noted to be exudative. No malignant cells noted on cytology. Respiratory status is stable.  Acute kidney injury on chronic kidney disease stage IIIa/hypokalemia Resolved with IV fluids.  Baseline creatinine is 1.4. Replace potassium  Acute metabolic encephalopathy Multifactorial.  Appears to have resolved.  History of PE Noted to be on Lovenox prior to admission which is being continued.  Stage IV adenocarcinoma of left lung Followed by Dr. Arbutus Ped.  On systemic chemotherapy.  Port-A-Cath placed by interventional radiology.  Palliative care was consulted. Last chemotherapy on 8/9.  Does not appear that he received any marrow stimulating agents during that time.  Diabetes mellitus type 2, controlled HbA1c 6.3.  Continue SSI.  Essential hypertension Losartan/HCTZ on hold due to AKI.  Monitor blood pressures closely.  Pancytopenia including normocytic anemia, leukopenia and thrombocytopenia Probably due to chemotherapy.  Check differential.    DVT Prophylaxis: On full dose Lovenox Code Status: DNR Family Communication: No family at bedside. Disposition Plan: Plan is for him to return home with wife  Status is: Inpatient Remains inpatient appropriate because: Need for IV antibiotics    Medications: Scheduled:  Chlorhexidine Gluconate Cloth  6 each Topical Daily   enoxaparin (LOVENOX) injection  70 mg Subcutaneous Q12H   feeding supplement  237 mL Oral BID BM   insulin aspart  0-9 Units Subcutaneous TID WC   metoprolol succinate  25 mg Oral Daily   sodium chloride flush  10-40 mL Intracatheter Q12H   Continuous:  ceFEPime (MAXIPIME) IV 2 g (02/10/22 1116)  TDV:[id number], mouth rinse, sodium chloride flush  Antibiotics: Anti-infectives (From admission, onward)    Start     Dose/Rate Route  Frequency Ordered Stop   02/08/22 2200  ceFEPIme (MAXIPIME) 2 g in sodium chloride 0.9 % 100 mL IVPB        2 g 200 mL/hr over 30 Minutes Intravenous Every 12 hours 02/08/22 1317 02/13/22 0959   02/08/22 0930  meropenem (MERREM) 1 g in sodium chloride 0.9 % 100 mL IVPB  Status:  Discontinued        1 g 200 mL/hr over 30 Minutes Intravenous Every 8 hours 02/08/22 0830 02/08/22 1235   02/07/22 1430  meropenem (MERREM) 1 g in sodium chloride 0.9 % 100 mL IVPB  Status:  Discontinued        1 g 200 mL/hr over 30 Minutes Intravenous Every 12 hours 02/07/22 1337 02/08/22 0830   02/05/22 2200  ceFEPIme (MAXIPIME) 2 g in sodium chloride 0.9 % 100 mL IVPB  Status:  Discontinued        2 g 200 mL/hr over 30 Minutes Intravenous Every 12 hours 02/05/22 1738 02/07/22 1337   02/05/22 1800  cefTRIAXone (ROCEPHIN) 2 g in sodium chloride 0.9 % 100 mL IVPB  Status:  Discontinued        2 g 200 mL/hr over 30 Minutes Intravenous Every 24 hours 02/04/22 2026 02/05/22 1738   02/05/22 1800  azithromycin (ZITHROMAX) 500 mg in sodium chloride 0.9 % 250 mL IVPB  Status:  Discontinued        500 mg 250 mL/hr over 60 Minutes Intravenous Every 24 hours 02/04/22 2026 02/07/22 1338   02/04/22 1800  cefTRIAXone (ROCEPHIN) 1 g in sodium chloride 0.9 % 100 mL IVPB        1 g 200 mL/hr over 30 Minutes Intravenous  Once 02/04/22 1757 02/04/22 2020   02/04/22 1800  azithromycin (ZITHROMAX) 500 mg in sodium chloride 0.9 % 250 mL IVPB        500 mg 250 mL/hr over 60 Minutes Intravenous  Once 02/04/22 1757 02/04/22 2146       Objective:  Vital Signs  Vitals:   02/09/22 1056 02/09/22 1243 02/09/22 2144 02/10/22 0509  BP: 120/87 135/89 (!) 160/90 (!) 147/88  Pulse: 99 98 88 89  Resp:  $Remo'20 16 17  'HCQil$ Temp:  98.1 F (36.7 C) 98.4 F (36.9 C) 98.2 F (36.8 C)  TempSrc:  Oral Oral Oral  SpO2:  100% 99% 98%  Weight:      Height:        Intake/Output Summary (Last 24 hours) at 02/10/2022 1121 Last data filed at 02/10/2022  0000 Gross per 24 hour  Intake 200 ml  Output 850 ml  Net -650 ml   Filed Weights   02/04/22 2050 02/08/22 1702  Weight: 78.3 kg 75.8 kg    General appearance: Awake alert.  In no distress Resp: Diminished air entry at the bases left more than right.  Few crackles.  No wheezing or rhonchi. Cardio: S1-S2 is normal regular.  No S3-S4.  No rubs murmurs or bruit GI: Abdomen is soft.  Nontender nondistended.  Bowel sounds are present normal.  No masses organomegaly Extremities: No edema.  Full range of motion of lower extremities. Neurologic: No focal neurological deficits.    Lab Results:  Data Reviewed: I have personally reviewed following labs and reports of the imaging studies  CBC: Recent Labs  Lab 02/03/22 1327 02/04/22 1510 02/05/22 0944 02/06/22 0355 02/07/22 0408  02/10/22 0455  WBC 9.1 9.5 10.8* 6.4 5.2 1.4*  NEUTROABS 7.4 8.1*  --   --   --   --   HGB 15.3 15.5 14.4 13.3 12.7* 11.7*  HCT 44.5 49.0 45.4 43.7 39.8 35.0*  MCV 93.5 97.2 98.9 101.9* 97.5 93.3  PLT 290 286 224 187 228 107*    Basic Metabolic Panel: Recent Labs  Lab 02/04/22 1622 02/05/22 0944 02/06/22 0355 02/07/22 0408 02/08/22 0348 02/10/22 0455  NA 145 143 144 145 143 144  K 4.4 4.4 4.2 4.0 3.5 3.3*  CL 110 110 115* 117* 116* 110  CO2 $Re'24 24 22 22 22 28  'qdD$ GLUCOSE 130* 122* 93 94 97 92  BUN 28* 33* 39* 37* 29* 24*  CREATININE 1.96* 1.88* 1.87* 1.66* 1.25* 1.10  CALCIUM 9.0 8.5* 8.2* 8.3* 8.1* 8.4*  MG 2.3  --   --   --   --   --   PHOS 3.7  --   --   --   --   --     GFR: Estimated Creatinine Clearance: 57.1 mL/min (by C-G formula based on SCr of 1.1 mg/dL).  Liver Function Tests: Recent Labs  Lab 02/03/22 1350 02/04/22 1622 02/05/22 0944  AST 28 41 47*  ALT 27 32 36  ALKPHOS 127* 122 100  BILITOT 0.6 0.4 0.5  PROT 7.1 6.8 6.1*  ALBUMIN 3.3* 3.0* 2.6*    Recent Labs  Lab 02/04/22 1622  LIPASE 25   Coagulation Profile: Recent Labs  Lab 02/04/22 1510  INR 1.7*     CBG: Recent Labs  Lab 02/09/22 1109 02/09/22 1632 02/09/22 2138 02/10/22 0158 02/10/22 0753  GLUCAP 97 109* 94 92 80      Recent Results (from the past 240 hour(s))  Susceptibility, Aer + Anaerob     Status: Abnormal   Collection Time: 02/04/22  1:34 PM  Result Value Ref Range Status   Suscept, Aer + Anaerob Preliminary report (A)  Final    Comment: (NOTE) Performed At: [facility] [place], [place] [id number] Rush Farmer MD ER:[id number]    Source of Sample [phone]  Final    Comment: Performed at [facility], [place] [address] [place], [place] [address]  Susceptibility Result     Status: Abnormal   Collection Time: 02/04/22  1:34 PM  Result Value Ref Range Status   Suscept Result 1 Acinetobacter lwoffii (A)  Final    Comment: (NOTE) Identification performed by account, not confirmed by this laboratory. Performed At: [facility] [place], [place] [id number] Rush Farmer MD YD:[id number]   Culture, blood (routine x 2)     Status: Abnormal (Preliminary result)   Collection Time: 02/04/22  5:30 PM   Specimen: BLOOD  Result Value Ref Range Status   Specimen Description   Final    BLOOD LEFT ANTECUBITAL Performed at [facility] [address], [place], [place] [address]    Special Requests   Final    BOTTLES DRAWN AEROBIC AND ANAEROBIC Blood Culture adequate volume Performed at [facility] [address] [place], [place] [address]    Culture  Setup Time   Final    GRAM NEGATIVE RODS IN BOTH AEROBIC AND ANAEROBIC BOTTLES CRITICAL RESULT CALLED TO, READ BACK BY AND VERIFIED WITH: PHARMD MARY ASHLYN ON 02/05/22 1723 BY DRT    Culture (A)  Final    ACINETOBACTER LWOFFII Sent to [facility] for further susceptibility testing.  Performed at [facility], [place] [address], [place], [place] [address]    Report Status PENDING  Incomplete  Blood Culture ID Panel (Reflexed)      Status: None   Collection Time: 02/04/22  5:30 PM  Result Value Ref Range Status   Enterococcus faecalis NOT DETECTED NOT DETECTED Final   Enterococcus Faecium NOT DETECTED NOT DETECTED Final   Listeria monocytogenes NOT DETECTED NOT DETECTED Final   Staphylococcus species NOT DETECTED NOT DETECTED Final   Staphylococcus aureus (BCID) NOT DETECTED NOT DETECTED Final   Staphylococcus epidermidis NOT DETECTED NOT DETECTED Final   Staphylococcus lugdunensis NOT DETECTED NOT DETECTED Final   Streptococcus species NOT DETECTED NOT DETECTED Final   Streptococcus agalactiae NOT DETECTED NOT DETECTED Final   Streptococcus pneumoniae NOT DETECTED NOT DETECTED Final   Streptococcus pyogenes NOT DETECTED NOT DETECTED Final   A.calcoaceticus-baumannii NOT DETECTED NOT DETECTED Final   Bacteroides fragilis NOT DETECTED NOT DETECTED Final   Enterobacterales NOT DETECTED NOT DETECTED Final   Enterobacter cloacae complex NOT DETECTED NOT DETECTED Final   Escherichia coli NOT DETECTED NOT DETECTED Final   Klebsiella aerogenes NOT DETECTED NOT DETECTED Final   Klebsiella oxytoca NOT DETECTED NOT DETECTED Final   Klebsiella pneumoniae NOT DETECTED NOT DETECTED Final   Proteus species NOT DETECTED NOT DETECTED Final   Salmonella species NOT DETECTED NOT DETECTED Final   Serratia marcescens NOT DETECTED NOT DETECTED Final   Haemophilus influenzae NOT DETECTED NOT DETECTED Final   Neisseria meningitidis NOT DETECTED NOT DETECTED Final   Pseudomonas aeruginosa NOT DETECTED NOT DETECTED Final   Stenotrophomonas maltophilia NOT DETECTED NOT DETECTED Final   Candida albicans NOT DETECTED NOT DETECTED Final   Candida auris NOT DETECTED NOT DETECTED Final   Candida glabrata NOT DETECTED NOT DETECTED Final   Candida krusei NOT DETECTED NOT DETECTED Final   Candida parapsilosis NOT DETECTED NOT DETECTED Final   Candida tropicalis NOT DETECTED NOT DETECTED Final   Cryptococcus neoformans/gattii NOT DETECTED  NOT DETECTED Final    Comment: Performed at [facility], [address] [place], [place] [address]  Culture, blood (routine x 2)     Status: None   Collection Time: 02/04/22  5:58 PM   Specimen: BLOOD  Result Value Ref Range Status   Specimen Description   Final    BLOOD BLOOD LEFT ARM Performed at [facility] [address], [place], [place] [address]    Special Requests   Final    BOTTLES DRAWN AEROBIC AND ANAEROBIC Blood Culture adequate volume Performed at [facility] [address], [place], [place] [address]    Culture   Final    NO GROWTH 5 DAYS Performed at [facility], [place] [address], [place], [place] [address]    Report Status 02/09/2022 FINAL  Final  Culture, body fluid w Gram Stain-bottle     Status: None   Collection Time: 02/05/22  1:46 PM   Specimen: Fluid  Result Value Ref Range Status   Specimen Description FLUID PLEURAL  Final   Special Requests BOTTLES DRAWN AEROBIC AND ANAEROBIC  Final   Culture   Final    NO GROWTH 5 DAYS Performed at [facility], [place] [address] [place], [place] [address]    Report Status 02/10/2022 FINAL  Final  Gram stain     Status: None   Collection Time: 02/05/22  1:46 PM   Specimen: BLOOD  Result Value Ref Range  Status   Specimen Description BLOOD PLEURAL  Final   Special Requests NONE  Final   Gram Stain   Final    CYTOSPIN SMEAR NO ORGANISMS SEEN NO WBC SEEN Performed at [facility], [address] [place], [place] [address]    Report Status 02/05/2022 FINAL  Final  Culture, blood (Routine X 2) w Reflex to ID Panel     Status: None (Preliminary result)   Collection Time: 02/06/22  1:41 PM   Specimen: BLOOD  Result Value Ref Range Status   Specimen Description   Final    BLOOD RIGHT ANTECUBITAL Performed at [facility] [address] [place], [place] [address]    Special Requests   Final    BOTTLES DRAWN AEROBIC AND ANAEROBIC  Blood Culture adequate volume Performed at [facility] [address] [place], [place] [address]    Culture   Final    NO GROWTH 4 DAYS Performed at [facility], [place] [address] [place], [place] [address]    Report Status PENDING  Incomplete  Culture, blood (Routine X 2) w Reflex to ID Panel     Status: None (Preliminary result)   Collection Time: 02/06/22  1:52 PM   Specimen: BLOOD  Result Value Ref Range Status   Specimen Description   Final    BLOOD BLOOD LEFT HAND Performed at [facility] [address], [place], [place] [address]    Special Requests   Final    BOTTLES DRAWN AEROBIC AND ANAEROBIC Blood Culture adequate volume Performed at [facility] [address], [place], [place] [address]    Culture   Final    NO GROWTH 4 DAYS Performed at [facility], [place] [address] [place], [place] [address]    Report Status PENDING  Incomplete      Radiology Studies: IR IMAGING GUIDED PORT INSERTION  Result Date: 02/08/2022 INDICATION: LEFT lung cancer. EXAM: IMPLANTED PORT A CATH PLACEMENT WITH ULTRASOUND AND FLUOROSCOPIC GUIDANCE MEDICATIONS: None ANESTHESIA/SEDATION: Moderate (conscious) sedation was employed during this procedure. A total of Versed 1 mg and Fentanyl 100 mcg was administered intravenously. Moderate Sedation Time: 36 minutes. The patient's level of consciousness and vital signs were monitored continuously by radiology nursing throughout the procedure under my direct supervision. FLUOROSCOPY TIME:  Fluoroscopic dose; 1 mGy COMPLICATIONS: None immediate. PROCEDURE: The procedure, risks, benefits, and alternatives were explained to the patient. Questions regarding the procedure were encouraged and answered. The patient understands and consents to the procedure. The RIGHT neck and chest were prepped with chlorhexidine in a sterile fashion, and a sterile drape was applied covering the operative  field. Maximum barrier sterile technique with sterile gowns and gloves were used for the procedure. A timeout was performed prior to the initiation of the procedure. Local anesthesia was provided with 1% lidocaine with epinephrine. After creating a small venotomy incision, a micropuncture kit was utilized to access the external jugular vein under direct, real-time ultrasound guidance. Ultrasound image documentation was performed. The microwire was kinked to measure appropriate catheter length. A subcutaneous port pocket was then created along the upper chest wall utilizing a combination of sharp and blunt dissection. The pocket was irrigated with sterile saline. A single lumen ISP power injectable port was chosen for placement. The 8 Fr catheter was tunneled from the port pocket site to the venotomy incision. The port was placed in the pocket. The external catheter was trimmed to appropriate  length. At the venotomy, an 8 Fr peel-away sheath was placed over a guidewire under fluoroscopic guidance. The catheter was then placed through the sheath and the sheath was removed. Final catheter positioning was confirmed and documented with a fluoroscopic spot radiograph. The port was accessed with a Huber needle, aspirated and flushed with heparinized saline. The port pocket incision was closed with interrupted 3-0 Vicryl suture then Dermabond was applied, including at the venotomy incision. Dressings were placed. The patient tolerated the procedure well without immediate post procedural complication. FINDINGS: Thrombosed RIGHT internal jugular vein. Patent RIGHT external jugular vein and EJ-subclavian confluence. IMPRESSION: Successful placement of a RIGHT external jugular approach power injectable Port-A-Cath. The tip of the catheter is positioned within the proximal RIGHT atrium. The catheter is ready for immediate use. Michaelle Birks, MD [facility] Radiology Electronically  Signed   By: Michaelle Birks M.D.   On: 02/08/2022 17:08       LOS: 6 days   [facility] Pager on [email or web address]  02/10/2022, 11:21 AM

## 2022-02-10 NOTE — Progress Notes (Signed)
Mobility Specialist - Progress Note     02/10/22 1530  Mobility  Activity Ambulated with assistance in room  Level of Assistance Standby assist, set-up cues, supervision of patient - no hands on  Assistive Device Front wheel walker  Distance Ambulated (ft) 20 ft  Activity Response Tolerated well  $Mobility charge 1 Mobility   Pt was found in bed and agreeable to mobilize. Had no complaints while ambulating and returned to bed with all necessities in reach. RN was notified of session.  Ferd Hibbs Mobility Specialist

## 2022-02-10 NOTE — Consult Note (Addendum)
   [facility] Inpatient Consult   02/10/2022  TINSLEY LOMAS 03-28-1946 [id number]  [facility] [ACO] Patient: [facility] Liaison remote coverage review for high risk patient at [facility]  Primary Care Provider:  Hoyt Koch, MD, [place] [facility], is an embedded provider with a Chronic Care Management team and program, and is listed for the transition of care follow up and appointments.  Patient was screened for readmission prevention needs of patient with Embedded practice service with noted high risk scores for unplanned readmission. Also, to assess for post hospital follow up needs. Progress notes reveals patient is followed by the Oncology team. Chart review included Palliative consult.  Plan: If patient transitions home with hospice services his post hospital needs are to be met at that level of care. Will follow til disposition for changes/needs.  Please contact for further questions,  Natividad Brood, RN BSN [facility] Liaison  [phone] business mobile phone Toll free office [phone]  Fax number: [phone] [email or web address]

## 2022-02-11 ENCOUNTER — Inpatient Hospital Stay: Payer: Medicare Other

## 2022-02-11 DIAGNOSIS — C3492 Malignant neoplasm of unspecified part of left bronchus or lung: Secondary | ICD-10-CM | POA: Diagnosis not present

## 2022-02-11 DIAGNOSIS — L8990 Pressure ulcer of unspecified site, unspecified stage: Secondary | ICD-10-CM | POA: Insufficient documentation

## 2022-02-11 DIAGNOSIS — R7881 Bacteremia: Secondary | ICD-10-CM | POA: Diagnosis not present

## 2022-02-11 DIAGNOSIS — J189 Pneumonia, unspecified organism: Secondary | ICD-10-CM | POA: Diagnosis not present

## 2022-02-11 LAB — BASIC METABOLIC PANEL
Anion gap: 6 (ref 5–15)
BUN: 24 mg/dL — ABNORMAL HIGH (ref 8–23)
CO2: 26 mmol/L (ref 22–32)
Calcium: 8.2 mg/dL — ABNORMAL LOW (ref 8.9–10.3)
Chloride: 108 mmol/L (ref 98–111)
Creatinine, Ser: 0.95 mg/dL (ref 0.61–1.24)
GFR, Estimated: >60 mL/min (ref >60)
Glucose, Bld: 94 mg/dL (ref 70–99)
Potassium: 3.6 mmol/L (ref 3.5–5.1)
Sodium: 140 mmol/L (ref 135–145)

## 2022-02-11 LAB — CBC WITH DIFFERENTIAL/PLATELET
Abs Immature Granulocytes: 0.01 10*3/uL (ref 0.00–0.07)
Basophils Absolute: 0 10*3/uL (ref 0.0–0.1)
Basophils Relative: 3 %
Eosinophils Absolute: 0.1 10*3/uL (ref 0.0–0.5)
Eosinophils Relative: 12 %
HCT: 33.9 % — ABNORMAL LOW (ref 39.0–52.0)
Hemoglobin: 11.2 g/dL — ABNORMAL LOW (ref 13.0–17.0)
Immature Granulocytes: 1 %
Lymphocytes Relative: 35 %
Lymphs Abs: 0.4 10*3/uL — ABNORMAL LOW (ref 0.7–4.0)
MCH: 30.9 pg (ref 26.0–34.0)
MCHC: 33 g/dL (ref 30.0–36.0)
MCV: 93.6 fL (ref 80.0–100.0)
Monocytes Absolute: 0 10*3/uL — ABNORMAL LOW (ref 0.1–1.0)
Monocytes Relative: 2 %
Neutro Abs: 0.5 10*3/uL — ABNORMAL LOW (ref 1.7–7.7)
Neutrophils Relative %: 47 %
Platelets: 86 10*3/uL — ABNORMAL LOW (ref 150–400)
RBC: 3.62 MIL/uL — ABNORMAL LOW (ref 4.22–5.81)
RDW: 15.2 % (ref 11.5–15.5)
WBC: 1.1 10*3/uL — CL (ref 4.0–10.5)
nRBC: 0 % (ref 0.0–0.2)

## 2022-02-11 LAB — CULTURE, BLOOD (ROUTINE X 2)
Culture: NO GROWTH
Culture: NO GROWTH
Special Requests: ADEQUATE
Special Requests: ADEQUATE

## 2022-02-11 LAB — MAGNESIUM: Magnesium: 1.8 mg/dL (ref 1.7–2.4)

## 2022-02-11 LAB — GLUCOSE, CAPILLARY
Glucose-Capillary: 84 mg/dL (ref 70–99)
Glucose-Capillary: 98 mg/dL (ref 70–99)
Glucose-Capillary: 100 mg/dL — ABNORMAL HIGH (ref 70–99)
Glucose-Capillary: 112 mg/dL — ABNORMAL HIGH (ref 70–99)

## 2022-02-11 MED ORDER — LOSARTAN POTASSIUM 50 MG PO TABS
100.0000 mg | ORAL_TABLET | Freq: Every day | ORAL | Status: DC
  Administered 2022-02-11 – 2022-02-19 (×9): 100 mg via ORAL
  Filled 2022-02-11 (×9): qty 2

## 2022-02-11 MED ORDER — SODIUM CHLORIDE 0.9 % IV SOLN
2.0000 g | Freq: Three times a day (TID) | INTRAVENOUS | Status: AC
  Administered 2022-02-11 – 2022-02-12 (×6): 2 g via INTRAVENOUS
  Filled 2022-02-11 (×6): qty 12.5

## 2022-02-11 MED ORDER — TBO-FILGRASTIM 480 MCG/0.8ML ~~LOC~~ SOSY
480.0000 ug | PREFILLED_SYRINGE | Freq: Every day | SUBCUTANEOUS | Status: DC
  Administered 2022-02-11 – 2022-02-12 (×2): 480 ug via SUBCUTANEOUS
  Filled 2022-02-11 (×2): qty 0.8

## 2022-02-11 NOTE — Progress Notes (Signed)
Physical Therapy Treatment Patient Details Name: Drew Harrington MRN: [id number] DOB: 04/07/1946 Today's Date: 02/11/2022   History of Present Illness Pt is 76 yo male who presents with diarrhea  and weakness. PMH includes stage IVB non-small cell lung cancer, adenocarcinoma with large apical LUL mass, with mediastinal, right cervical and bilateral adrenal metastases, PE involving large right main on Lovenox (dx 12/2021), HTN, dementia, CKD 3a. He was also found to have large left pleural effusion, s/p left thoracentesis on 8/11.    PT Comments    Pt familiar from recent admit.  General Comments: improved cognition able to recall falling in bathroom and aware of current situtation.  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  Slightly slow and groggy.  Feels "better" but "tired".  Pt currently in bed on 2 lts sats 97%.  Trial removed oxygen.  General bed mobility comments: self able with increased time.  General transfer comment: asissted OOB as well as on/off toilet.  Pt did require increased assist from lower level toilet with heavy use grab bar to steady self. General Gait Details: Trial RA assisted with amb to and from bathroom 12 feet x 2 sats lowest 91% with avg 93%.  No true dyspnea.  No cough.  Slightly unsteady gait with turns.  Declined to amb in hallway due to c/o "tired".  Left pt on RA and notified RN.  Pt plans to return home with spouse.    Recommendations for follow up therapy are one component of a multi-disciplinary discharge planning process, led by the attending physician.  Recommendations may be updated based on patient status, additional functional criteria and insurance authorization.  Follow Up Recommendations  Home health PT     Assistance Recommended at Discharge Frequent or constant Supervision/Assistance  Patient can return home with the following A little help with walking and/or transfers;A little help with bathing/dressing/bathroom;Assistance with  cooking/housework;Assist for transportation;Help with stairs or ramp for entrance;Direct supervision/assist for financial management   Equipment Recommendations  None recommended by PT    Recommendations for Other Services       Precautions / Restrictions Precautions Precautions: Fall Precaution Comments: monitor O2 Restrictions Weight Bearing Restrictions: No     Mobility  Bed Mobility Overal bed mobility: Needs Assistance Bed Mobility: Supine to Sit     Supine to sit: Supervision     General bed mobility comments: self able with increased time    Transfers Overall transfer level: Needs assistance Equipment used: Rolling walker (2 wheels) Transfers: Sit to/from Stand, Bed to chair/wheelchair/BSC Sit to Stand: Min guard, Supervision Stand pivot transfers: Min guard, Min assist         General transfer comment: asissted OOB as well as on/off toilet.  Pt did require increased assist from lower level toilet with heavy use grab bar to steady self.    Ambulation/Gait Ambulation/Gait assistance: Supervision, Min guard Gait Distance (Feet): 24 Feet (12 feet x 2) Assistive device: Rolling walker (2 wheels) Gait Pattern/deviations: Step-through pattern, Decreased stride length Gait velocity: WFL     General Gait Details: Trial RA assisted with amb to and from bathroom 12 feet x 2 sats lowest 91% with avg 93%.  No true dyspnea.  No cough.  Slightly unsteady gait with turns.  Declined to amb in hallway due to c/o "tired".   Stairs             Wheelchair Mobility    Modified Rankin (Stroke Patients Only)       Balance  Cognition Arousal/Alertness: Awake/alert Behavior During Therapy: WFL for tasks assessed/performed Overall Cognitive Status: Within Functional Limits for tasks assessed                                 General Comments: improved cognition able to recall falling in  bathroom and aware of current situtation.  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  Slightly slow and groggy.  Feels "better" but "tired".        Exercises      General Comments        Pertinent Vitals/Pain Pain Assessment Pain Assessment: No/denies pain    Home Living                          Prior Function            PT Goals (current goals can now be found in the care plan section) Progress towards PT goals: Progressing toward goals    Frequency    Min 3X/week      PT Plan Current plan remains appropriate    Co-evaluation              AM-PAC PT "6 Clicks" Mobility   Outcome Measure  Help needed turning from your back to your side while in a flat bed without using bedrails?: A Little Help needed moving from lying on your back to sitting on the side of a flat bed without using bedrails?: A Little Help needed moving to and from a bed to a chair (including a wheelchair)?: A Little Help needed standing up from a chair using your arms (e.g., wheelchair or bedside chair)?: A Little Help needed to walk in hospital room?: A Little Help needed climbing 3-5 steps with a railing? : A Little 6 Click Score: 18    End of Session Equipment Utilized During Treatment: Gait belt Activity Tolerance: Patient tolerated treatment well Patient left: in chair;with call bell/phone within reach;with chair alarm set;with family/visitor present Nurse Communication: Mobility status PT Visit Diagnosis: Unsteadiness on feet (R26.81);Muscle weakness (generalized) (M62.81)     Time: 1202-1227 PT Time Calculation (min) (ACUTE ONLY): 25 min  Charges:  $Gait Training: 8-22 mins $Therapeutic Activity: 8-22 mins                     {Berry Godsey  PTA Acute  [employer] M-F          [phone] Weekend pager [phone]

## 2022-02-11 NOTE — Progress Notes (Signed)
Pharmacy Antibiotic Note  Drew Harrington is a 76 y.o. male admitted on 02/04/2022 with bacteremia.  Pharmacy has been consulted for cefepime dosing.  Presented to ED on 02/04/22 w/ diarrhea and confusion s/p chemotherapy on 8/9. Patient had pleural effusion on admission. Patient has a hx of NSCLC, HTN, CKD, dementia, and PE. Blood culture obtained on 8/10 + for acinetobacter lwoffii.   Today, 02/08/22 WBC 1.1, ANC 0.5 - low s/p chemotherapy SCr improved to WNL, CrCl 66 mL/min Awaiting sensitivities on acinetobacter lwoffii - has been sent to [facility] Increase cefepime to 2 g IV q8h based on improvement in renal function Monitor culture data, renal function Planning for total duration of 7 days. Stop date entered for 8/18  Height: 5\' 9"  (175.3 cm) Weight: 75.8 kg (167 lb 1.7 oz) IBW/kg (Calculated) : 70.7  Temp (24hrs), Avg:98.1 F (36.7 C), Min:97.5 F (36.4 C), Max:98.9 F (37.2 C)  Recent Labs  Lab 02/04/22 1511 02/04/22 1622 02/04/22 1711 02/05/22 0944 02/06/22 0355 02/07/22 0408 02/08/22 0348 02/10/22 0455 02/11/22 0315  WBC  --   --   --  10.8* 6.4 5.2  --  1.4* 1.1*  CREATININE  --    < >  --  1.88* 1.87* 1.66* 1.25* 1.10 0.95  LATICACIDVEN 2.8*  --  1.9  --   --   --   --   --   --    < > = values in this interval not displayed.     Estimated Creatinine Clearance: 66.2 mL/min (by C-G formula based on SCr of 0.95 mg/dL).    Allergies  Allergen Reactions   Sildenafil Other (See Comments)    Caused a headache    Microbiology results: 8/10 BCx: Acinetobacter Lwoffi - sensitivities pending 8/12 Bcx: ngF 8/11 Pleural fluid: ngF  [name], PharmD 02/11/22 7:32 AM

## 2022-02-11 NOTE — Progress Notes (Signed)
[facility] PROGRESS NOTE   RYZEN DEADY QMG:[id number] DOB: [birth year] DOA: 02/04/2022  PCP: Hoyt Koch, MD  Brief History/Interval Summary: 76 y.o. male with medical history significant of stage IVB non-small cell lung cancer, adenocarcinoma with large apical LUL mass, with mediastinal, right cervical and bilateral adrenal metastases, PE involving large right main on Lovenox (dx 12/2021), HTN, dementia, CKD 3a who presents with diarrhea  and weakness.  Patient just received first session of chemotherapy on 8/9 and then started to have diarrhea.  He slipped in the bathtub while trying to wash out and subsequently was sent to ED.  In the ED, patient had a fever, was tachycardic and tachypneic.  With elevated creatinine.  CT head and cervical spine were negative for injuries.  He was also found to have large left pleural effusion.  His Lovenox was switched to IV heparin in anticipation for thoracentesis.  Patient underwent thoracentesis 8/11 which removed 1.1 L fluid. Hospitalization further complicated by blood culture +Acinectobacter.    Consultants: Phone discussion with ID  Procedures: Thoracentesis    Subjective/Interval History: Patient denies any complaints.  Feels well.  Occasional shortness of breath but much better compared to a few days ago.  No chest pain.  Wants to go home.      Assessment/Plan:  Community-acquired pneumonia/severe sepsis present on admission/chronic respiratory failure with hypoxia Sepsis was present on admission with fever, tachycardia, tachypnea with endorgan damage including acute kidney injury and respiratory failure Patient seems to be improving.  Respiratory status seems to be back to baseline. He is on oxygen by nasal cannula which he also uses at home. Currently noted to be on cefepime.  See below.  Acinetobacter bacteremia Found in 1 aerobic bottle on 8/10.  Repeat cultures from 8/12 negative. Initially on cefepime and  azithromycin.  Changed over initially to meropenem and then changed back to cefepime.  Plan is to treat for total of 7 days.  He will complete treatment on 8/18.   Susceptibility data is still pending.  Had to be sent out to [facility].  Microbiology was called today and they said that it may take up to a week for these results to be available.  Since patient is afebrile 1 week of treatment should suffice although this plan is somewhat complicated by development of neutropenia from his chemotherapy.  Large left pleural effusion Underwent thoracentesis on 8/11 with removal of 1.1 L of fluid.  Was noted to be exudative. No malignant cells noted on cytology.  Cultures without any growth. Respiratory status is stable.  Acute kidney injury on chronic kidney disease stage IIIa/hypokalemia Resolved with IV fluids.  Baseline creatinine is 1.4. Potassium is normal today.  Magnesium 1.8.  Acute metabolic encephalopathy Multifactorial.  Appears to have resolved.  History of PE Noted to be on Lovenox prior to admission which is being continued.  Stage IV adenocarcinoma of left lung Followed by Dr. Julien Nordmann.  On systemic chemotherapy.  Port-A-Cath placed by interventional radiology.  Palliative care was consulted. Last chemotherapy on 8/9.  Does not appear that he received any marrow stimulating agents during that time.  Pancytopenia including normocytic anemia, leukopenia and thrombocytopenia Most likely due to chemotherapy that he received on 8/9.  Neutrophil counts noted to be 500 today.  Platelet count 86,000.  Will discuss with oncology whether this needs any intervention.  Diabetes mellitus type 2, controlled HbA1c 6.3.  Continue SSI.  CBGs are reasonably well controlled.  Essential hypertension Losartan/HCTZ on hold due to  AKI.  Renal function back to baseline.  Blood pressure is noted to be elevated.  We will resume his home medications.     DVT Prophylaxis: On full dose Lovenox Code  Status: DNR Family Communication: No family at bedside.  Discussed with his wife yesterday. Disposition Plan: Plan is for him to return home with wife.  Status is: Inpatient Remains inpatient appropriate because: Need for IV antibiotics    Medications: Scheduled:  Chlorhexidine Gluconate Cloth  6 each Topical Daily   enoxaparin (LOVENOX) injection  70 mg Subcutaneous Q12H   feeding supplement  237 mL Oral BID BM   insulin aspart  0-9 Units Subcutaneous TID WC   metoprolol succinate  25 mg Oral Daily   sodium chloride flush  10-40 mL Intracatheter Q12H   Continuous:  ceFEPime (MAXIPIME) IV 2 g (02/11/22 0900)   XTK:[id number], mouth rinse, sodium chloride flush  Antibiotics: Anti-infectives (From admission, onward)    Start     Dose/Rate Route Frequency Ordered Stop   02/11/22 0800  ceFEPIme (MAXIPIME) 2 g in sodium chloride 0.9 % 100 mL IVPB        2 g 200 mL/hr over 30 Minutes Intravenous Every 8 hours 02/11/22 0727 02/12/22 2359   02/08/22 2200  ceFEPIme (MAXIPIME) 2 g in sodium chloride 0.9 % 100 mL IVPB  Status:  Discontinued        2 g 200 mL/hr over 30 Minutes Intravenous Every 12 hours 02/08/22 1317 02/11/22 0727   02/08/22 0930  meropenem (MERREM) 1 g in sodium chloride 0.9 % 100 mL IVPB  Status:  Discontinued        1 g 200 mL/hr over 30 Minutes Intravenous Every 8 hours 02/08/22 0830 02/08/22 1235   02/07/22 1430  meropenem (MERREM) 1 g in sodium chloride 0.9 % 100 mL IVPB  Status:  Discontinued        1 g 200 mL/hr over 30 Minutes Intravenous Every 12 hours 02/07/22 1337 02/08/22 0830   02/05/22 2200  ceFEPIme (MAXIPIME) 2 g in sodium chloride 0.9 % 100 mL IVPB  Status:  Discontinued        2 g 200 mL/hr over 30 Minutes Intravenous Every 12 hours 02/05/22 1738 02/07/22 1337   02/05/22 1800  cefTRIAXone (ROCEPHIN) 2 g in sodium chloride 0.9 % 100 mL IVPB  Status:  Discontinued        2 g 200 mL/hr over 30 Minutes Intravenous Every 24 hours 02/04/22 2026  02/05/22 1738   02/05/22 1800  azithromycin (ZITHROMAX) 500 mg in sodium chloride 0.9 % 250 mL IVPB  Status:  Discontinued        500 mg 250 mL/hr over 60 Minutes Intravenous Every 24 hours 02/04/22 2026 02/07/22 1338   02/04/22 1800  cefTRIAXone (ROCEPHIN) 1 g in sodium chloride 0.9 % 100 mL IVPB        1 g 200 mL/hr over 30 Minutes Intravenous  Once 02/04/22 1757 02/04/22 2020   02/04/22 1800  azithromycin (ZITHROMAX) 500 mg in sodium chloride 0.9 % 250 mL IVPB        500 mg 250 mL/hr over 60 Minutes Intravenous  Once 02/04/22 1757 02/04/22 2146       Objective:  Vital Signs  Vitals:   02/10/22 0509 02/10/22 1346 02/10/22 2100 02/11/22 0440  BP: (!) 147/88 (!) 156/91 (!) 149/80 (!) 161/95  Pulse: 89 93 94 91  Resp: 17 17 20 20   Temp: 98.2 F (36.8 C) 98 F (36.7 C)  98.9 F (37.2 C) (!) 97.5 F (36.4 C)  TempSrc: Oral Oral Oral Oral  SpO2: 98% 100% 98% 99%  Weight:      Height:        Intake/Output Summary (Last 24 hours) at 02/11/2022 1112 Last data filed at 02/11/2022 0900 Gross per 24 hour  Intake 170 ml  Output 850 ml  Net -680 ml    Filed Weights   02/04/22 2050 02/08/22 1702  Weight: 78.3 kg 75.8 kg    General appearance: Awake alert.  In no distress Resp: Air entry at the bases left more than right.  Few crackles.  No wheezing or rhonchi. Cardio: S1-S2 is normal regular.  No S3-S4.  No rubs murmurs or bruit GI: Abdomen is soft.  Nontender nondistended.  Bowel sounds are present normal.  No masses organomegaly Extremities: No edema.  Full range of motion of lower extremities. Neurologic: No focal neurological deficits.     Lab Results:  Data Reviewed: I have personally reviewed following labs and reports of the imaging studies  CBC: Recent Labs  Lab 02/04/22 1510 02/05/22 0944 02/06/22 0355 02/07/22 0408 02/10/22 0455 02/11/22 0315  WBC 9.5 10.8* 6.4 5.2 1.4* 1.1*  NEUTROABS 8.1*  --   --   --  0.8* 0.5*  HGB 15.5 14.4 13.3 12.7* 11.7* 11.2*   HCT 49.0 45.4 43.7 39.8 35.0* 33.9*  MCV 97.2 98.9 101.9* 97.5 93.3 93.6  PLT 286 224 187 228 107* 86*     Basic Metabolic Panel: Recent Labs  Lab 02/04/22 1622 02/05/22 0944 02/06/22 0355 02/07/22 0408 02/08/22 0348 02/10/22 0455 02/11/22 0315  NA 145   < > 144 145 143 144 140  K 4.4   < > 4.2 4.0 3.5 3.3* 3.6  CL 110   < > 115* 117* 116* 110 108  CO2 24   < > 22 22 22 28 26   GLUCOSE 130*   < > 93 94 97 92 94  BUN 28*   < > 39* 37* 29* 24* 24*  CREATININE 1.96*   < > 1.87* 1.66* 1.25* 1.10 0.95  CALCIUM 9.0   < > 8.2* 8.3* 8.1* 8.4* 8.2*  MG 2.3  --   --   --   --   --  1.8  PHOS 3.7  --   --   --   --   --   --    < > = values in this interval not displayed.     GFR: Estimated Creatinine Clearance: 66.2 mL/min (by C-G formula based on SCr of 0.95 mg/dL).  Liver Function Tests: Recent Labs  Lab 02/04/22 1622 02/05/22 0944  AST 41 47*  ALT 32 36  ALKPHOS 122 100  BILITOT 0.4 0.5  PROT 6.8 6.1*  ALBUMIN 3.0* 2.6*     Recent Labs  Lab 02/04/22 1622  LIPASE 25    Coagulation Profile: Recent Labs  Lab 02/04/22 1510  INR 1.7*     CBG: Recent Labs  Lab 02/10/22 0753 02/10/22 1159 02/10/22 1712 02/10/22 2058 02/11/22 0736  GLUCAP 80 91 105* 107* 84       Recent Results (from the past 240 hour(s))  Susceptibility, Aer + Anaerob     Status: Abnormal   Collection Time: 02/04/22  1:34 PM  Result Value Ref Range Status   Suscept, Aer + Anaerob Preliminary report (A)  Final    Comment: (NOTE) Performed At: [facility] [employer] [address] [place], [place] [id number] Rush Farmer  MD [id number]    Source of Sample [phone]  Final    Comment: Performed at [facility], [place] [address] [place], [place] [address]  Susceptibility Result     Status: Abnormal   Collection Time: 02/04/22  1:34 PM  Result Value Ref Range Status   Suscept Result 1 Acinetobacter lwoffii (A)  Final    Comment: (NOTE) Identification performed by account,  not confirmed by this laboratory. Performed At: [facility] [place], [place] [id number] Rush Farmer MD [id number]   Culture, blood (routine x 2)     Status: Abnormal (Preliminary result)   Collection Time: 02/04/22  5:30 PM   Specimen: BLOOD  Result Value Ref Range Status   Specimen Description   Final    BLOOD LEFT ANTECUBITAL Performed at [facility] [address] [place], [place] [address]    Special Requests   Final    BOTTLES DRAWN AEROBIC AND ANAEROBIC Blood Culture adequate volume Performed at [facility] [address] [place], [place] [address]    Culture  Setup Time   Final    GRAM NEGATIVE RODS IN BOTH AEROBIC AND ANAEROBIC BOTTLES CRITICAL RESULT CALLED TO, READ BACK BY AND VERIFIED WITH: PHARMD MARY ASHLYN ON 02/05/22 1723 BY DRT    Culture (A)  Final    ACINETOBACTER LWOFFII Sent to [facility] for further susceptibility testing. Performed at [facility], [place] [address] [place], [place] [address]    Report Status PENDING  Incomplete  Blood Culture ID Panel (Reflexed)     Status: None   Collection Time: 02/04/22  5:30 PM  Result Value Ref Range Status   Enterococcus faecalis NOT DETECTED NOT DETECTED Final   Enterococcus Faecium NOT DETECTED NOT DETECTED Final   Listeria monocytogenes NOT DETECTED NOT DETECTED Final   Staphylococcus species NOT DETECTED NOT DETECTED Final   Staphylococcus aureus (BCID) NOT DETECTED NOT DETECTED Final   Staphylococcus epidermidis NOT DETECTED NOT DETECTED Final   Staphylococcus lugdunensis NOT DETECTED NOT DETECTED Final   Streptococcus species NOT DETECTED NOT DETECTED Final   Streptococcus agalactiae NOT DETECTED NOT DETECTED Final   Streptococcus pneumoniae NOT DETECTED NOT DETECTED Final   Streptococcus pyogenes NOT DETECTED NOT DETECTED Final   A.calcoaceticus-baumannii NOT DETECTED NOT DETECTED Final   Bacteroides fragilis NOT DETECTED NOT  DETECTED Final   Enterobacterales NOT DETECTED NOT DETECTED Final   Enterobacter cloacae complex NOT DETECTED NOT DETECTED Final   Escherichia coli NOT DETECTED NOT DETECTED Final   Klebsiella aerogenes NOT DETECTED NOT DETECTED Final   Klebsiella oxytoca NOT DETECTED NOT DETECTED Final   Klebsiella pneumoniae NOT DETECTED NOT DETECTED Final   Proteus species NOT DETECTED NOT DETECTED Final   Salmonella species NOT DETECTED NOT DETECTED Final   Serratia marcescens NOT DETECTED NOT DETECTED Final   Haemophilus influenzae NOT DETECTED NOT DETECTED Final   Neisseria meningitidis NOT DETECTED NOT DETECTED Final   Pseudomonas aeruginosa NOT DETECTED NOT DETECTED Final   Stenotrophomonas maltophilia NOT DETECTED NOT DETECTED Final   Candida albicans NOT DETECTED NOT DETECTED Final   Candida auris NOT DETECTED NOT DETECTED Final   Candida glabrata NOT DETECTED NOT DETECTED Final   Candida krusei NOT DETECTED NOT DETECTED Final   Candida parapsilosis NOT DETECTED NOT DETECTED Final   Candida tropicalis NOT DETECTED NOT DETECTED Final   Cryptococcus neoformans/gattii NOT DETECTED NOT DETECTED Final    Comment: Performed at [facility], [address], [place], [place] [address]  Culture, blood (routine x 2)     Status: None   Collection Time: 02/04/22  5:58 PM   Specimen: BLOOD  Result Value Ref Range Status   Specimen Description   Final    BLOOD BLOOD LEFT ARM Performed at [facility] [address] [place], [place] [address]    Special Requests   Final    BOTTLES DRAWN AEROBIC AND ANAEROBIC Blood Culture adequate volume Performed at [facility] [address] [place], [place] [address]    Culture   Final    NO GROWTH 5 DAYS Performed at [facility], [place] [address], [place], [place] [address]    Report Status 02/09/2022 FINAL  Final  Culture, body fluid w Gram Stain-bottle     Status: None   Collection Time: 02/05/22  1:46 PM    Specimen: Fluid  Result Value Ref Range Status   Specimen Description FLUID PLEURAL  Final   Special Requests BOTTLES DRAWN AEROBIC AND ANAEROBIC  Final   Culture   Final    NO GROWTH 5 DAYS Performed at [facility], [place] [address], [place], [place] [address]    Report Status 02/10/2022 FINAL  Final  Gram stain     Status: None   Collection Time: 02/05/22  1:46 PM   Specimen: BLOOD  Result Value Ref Range Status   Specimen Description BLOOD PLEURAL  Final   Special Requests NONE  Final   Gram Stain   Final    CYTOSPIN SMEAR NO ORGANISMS SEEN NO WBC SEEN Performed at [facility], [place] [address] [place], [place] [address]    Report Status 02/05/2022 FINAL  Final  Culture, blood (Routine X 2) w Reflex to ID Panel     Status: None   Collection Time: 02/06/22  1:41 PM   Specimen: BLOOD  Result Value Ref Range Status   Specimen Description   Final    BLOOD RIGHT ANTECUBITAL Performed at [facility] [address], [place], [place] [address]    Special Requests   Final    BOTTLES DRAWN AEROBIC AND ANAEROBIC Blood Culture adequate volume Performed at [facility] [address], [place], [place] [address]    Culture   Final    NO GROWTH 5 DAYS Performed at [facility], [place] [address] [place], [place] [address]    Report Status 02/11/2022 FINAL  Final  Culture, blood (Routine X 2) w Reflex to ID Panel     Status: None   Collection Time: 02/06/22  1:52 PM   Specimen: BLOOD  Result Value Ref Range Status   Specimen Description   Final    BLOOD BLOOD LEFT HAND Performed at [facility] [address], [place], [place] [address]    Special Requests   Final    BOTTLES DRAWN AEROBIC AND ANAEROBIC Blood Culture adequate volume Performed at [facility] [address] [place], [place] [address]    Culture   Final    NO GROWTH 5 DAYS Performed at [facility], [place] [address] [place], [place] [address]    Report Status 02/11/2022 FINAL  Final      Radiology Studies: No results found.     LOS: 7 days   Heran Campau [employer] on [email or web address]  02/11/2022, 11:12 AM

## 2022-02-11 NOTE — TOC Progression Note (Addendum)
Transition of Care [facility]) - Progression Note    Patient Details  Name: Drew Harrington MRN: [id number] Date of Birth: 10/25/1945  Transition of Care [facility]) CM/SW [place], RN Phone Number: 02/11/2022, 2:04 PM  Clinical Narrative:   RNCM spoke with patient's wife Drew Harrington to check on the status of DME supplies (hospital bed, bedside commode, overbed tray and shower chair- [name] reports she has a shower chair).Per [place] she has spoke with Brenton Grills at [employer] and awaiting a delivery date for DME. Drew Harrington will continue to work directly with [employer] for DME delivery. This RNCM advised [name] HHPT/OT, HHA services are set up with [facility]. Per chart review palliative has seen patient's wife and discussed home hospice, Drew Harrington indicates not now, patient and Dr. Inda Merlin will discuss further. RNCM notified  RN Margreta Journey, re: patient's wife Drew Harrington request for call for pt's status today.   TOC will continue to follow.  Expected Discharge Plan: [place] Barriers to Discharge: Continued Medical Work up  Expected Discharge Plan and Services Expected Discharge Plan: [place] In-house Referral: NA Discharge Planning Services: CM Consult Post Acute Care Choice: Durable Medical Equipment, Home Health Living arrangements for the past 2 months: [place]                 DME Arranged: Bedside commode, Hospital bed, Overbed table, Shower stool DME Agency: [employer] Date DME Agency Contacted: 02/09/22   Representative spoke with at DME Agency: Brenton Grills HH Arranged: PT, Nurse's Aide, OT, Social Work [employer] Agency: [facility] [place] Date [place]: 02/08/22   Representative spoke with at [place]: [place] (SDOH) Interventions    Readmission Risk Interventions    02/11/2022    2:01 PM 02/10/2022   10:02 AM  Readmission Risk Prevention Plan  Transportation Screening Complete   PCP or Specialist Appt  within 3-5 Days Complete Complete  HRI or [place] Complete Complete  Social Work Consult for [place] Planning/Counseling Complete Complete  Palliative Care Screening  Not Applicable  Medication Review Press photographer) Complete Complete

## 2022-02-11 NOTE — Care Management Important Message (Signed)
Important Message  Patient Details IM Letter placed in Patients room. Name: Drew Harrington MRN: [id number] Date of Birth: 02-27-1946   Medicare Important Message Given:  Yes     Kerin Salen 02/11/2022, 12:57 PM

## 2022-02-11 NOTE — Plan of Care (Signed)
  Problem: Clinical Measurements: Goal: Will remain free from infection Outcome: Progressing Goal: Diagnostic test results will improve Outcome: Progressing Goal: Respiratory complications will improve Outcome: Progressing Goal: Cardiovascular complication will be avoided Outcome: Progressing   Problem: Nutrition: Goal: Adequate nutrition will be maintained Outcome: Progressing   Problem: Coping: Goal: Level of anxiety will decrease Outcome: Progressing   Problem: Elimination: Goal: Will not experience complications related to urinary retention Outcome: Progressing   Problem: Pain Managment: Goal: General experience of comfort will improve Outcome: Progressing   Problem: Safety: Goal: Ability to remain free from injury will improve Outcome: Progressing   Problem: Clinical Measurements: Goal: Ability to maintain a body temperature in the normal range will improve Outcome: Progressing   Problem: Respiratory: Goal: Ability to maintain adequate ventilation will improve Outcome: Progressing Goal: Ability to maintain a clear airway will improve Outcome: Progressing   Problem: Coping: Goal: Ability to adjust to condition or change in health will improve Outcome: Progressing   Problem: Fluid Volume: Goal: Ability to maintain a balanced intake and output will improve Outcome: Progressing   Problem: Metabolic: Goal: Ability to maintain appropriate glucose levels will improve Outcome: Progressing   Problem: Nutritional: Goal: Maintenance of adequate nutrition will improve Outcome: Progressing Goal: Progress toward achieving an optimal weight will improve Outcome: Progressing   Problem: Tissue Perfusion: Goal: Adequacy of tissue perfusion will improve Outcome: Progressing   Problem: Education: Goal: Knowledge of General Education information will improve Description: Including pain rating scale, medication(s)/side effects and non-pharmacologic comfort  measures Outcome: Not Progressing   Problem: Health Behavior/Discharge Planning: Goal: Ability to manage health-related needs will improve Outcome: Not Progressing   Problem: Clinical Measurements: Goal: Ability to maintain clinical measurements within normal limits will improve Outcome: Not Progressing   Problem: Activity: Goal: Risk for activity intolerance will decrease Outcome: Not Progressing   Problem: Elimination: Goal: Will not experience complications related to bowel motility Outcome: Not Progressing   Problem: Activity: Goal: Ability to tolerate increased activity will improve Outcome: Not Progressing   Problem: Education: Goal: Ability to describe self-care measures that may prevent or decrease complications (Diabetes Survival Skills Education) will improve Outcome: Not Progressing   Problem: Health Behavior/Discharge Planning: Goal: Ability to manage health-related needs will improve Outcome: Not Progressing

## 2022-02-12 DIAGNOSIS — C3492 Malignant neoplasm of unspecified part of left bronchus or lung: Secondary | ICD-10-CM | POA: Diagnosis not present

## 2022-02-12 DIAGNOSIS — J189 Pneumonia, unspecified organism: Secondary | ICD-10-CM | POA: Diagnosis not present

## 2022-02-12 DIAGNOSIS — R7881 Bacteremia: Secondary | ICD-10-CM | POA: Diagnosis not present

## 2022-02-12 LAB — GLUCOSE, CAPILLARY
Glucose-Capillary: 87 mg/dL (ref 70–99)
Glucose-Capillary: 139 mg/dL — ABNORMAL HIGH (ref 70–99)
Glucose-Capillary: 139 mg/dL — ABNORMAL HIGH (ref 70–99)
Glucose-Capillary: 92 mg/dL (ref 70–99)

## 2022-02-12 LAB — CBC WITH DIFFERENTIAL/PLATELET
Abs Immature Granulocytes: 0.02 10*3/uL (ref 0.00–0.07)
Basophils Absolute: 0 10*3/uL (ref 0.0–0.1)
Basophils Relative: 2 %
Eosinophils Absolute: 0.1 10*3/uL (ref 0.0–0.5)
Eosinophils Relative: 12 %
HCT: 33.6 % — ABNORMAL LOW (ref 39.0–52.0)
Hemoglobin: 11.5 g/dL — ABNORMAL LOW (ref 13.0–17.0)
Immature Granulocytes: 2 %
Lymphocytes Relative: 28 %
Lymphs Abs: 0.3 10*3/uL — ABNORMAL LOW (ref 0.7–4.0)
MCH: 31.2 pg (ref 26.0–34.0)
MCHC: 34.2 g/dL (ref 30.0–36.0)
MCV: 91.1 fL (ref 80.0–100.0)
Monocytes Absolute: 0 10*3/uL — ABNORMAL LOW (ref 0.1–1.0)
Monocytes Relative: 2 %
Neutro Abs: 0.6 10*3/uL — ABNORMAL LOW (ref 1.7–7.7)
Neutrophils Relative %: 54 %
Platelets: 55 10*3/uL — ABNORMAL LOW (ref 150–400)
RBC: 3.69 MIL/uL — ABNORMAL LOW (ref 4.22–5.81)
RDW: 14.8 % (ref 11.5–15.5)
WBC: 1 10*3/uL — CL (ref 4.0–10.5)
nRBC: 0 % (ref 0.0–0.2)

## 2022-02-12 MED ORDER — METOPROLOL SUCCINATE ER 50 MG PO TB24
50.0000 mg | ORAL_TABLET | Freq: Every day | ORAL | Status: DC
  Administered 2022-02-13: 50 mg via ORAL
  Filled 2022-02-12: qty 1

## 2022-02-12 MED ORDER — METOPROLOL SUCCINATE ER 25 MG PO TB24
25.0000 mg | ORAL_TABLET | Freq: Once | ORAL | Status: AC
Start: 2022-02-12 — End: 2022-02-12
  Administered 2022-02-12: 25 mg via ORAL
  Filled 2022-02-12: qty 1

## 2022-02-12 MED ORDER — GUAIFENESIN 100 MG/5ML PO LIQD
5.0000 mL | ORAL | Status: DC | PRN
  Administered 2022-02-12 – 2022-02-19 (×4): 5 mL via ORAL
  Filled 2022-02-12 (×5): qty 10

## 2022-02-12 MED ORDER — GUAIFENESIN ER 600 MG PO TB12
600.0000 mg | ORAL_TABLET | Freq: Two times a day (BID) | ORAL | Status: DC
Start: 2022-02-12 — End: 2022-02-19
  Administered 2022-02-12 – 2022-02-19 (×14): 600 mg via ORAL
  Filled 2022-02-12 (×15): qty 1

## 2022-02-12 NOTE — Discharge Instructions (Signed)
High Calorie, High Protein Nutrition Therapy  A high-calorie, high-protein diet has been recommended to you. Your registered dietitian nutritionist (RDN) may have recommended this diet because you are having difficulty eating enough calories throughout the day, you have lost weight, and/or you need to add protein to your diet.  Sometimes you may not feel like eating, even if you know the importance of good nutrition. The recommendations in this handout can help you with the following:  Regaining your strength and energy Keeping your body healthy Healing and recovering from surgery or illness and fighting infection  Schedule Your Meals and Snacks  Several small meals and snacks are often better tolerated and digested than large meals.  Strategies  Plan to eat 3 meals and 3 snacks daily. Experiment with timing meals to find out when you have a larger appetite. Appetite may be greatest in the morning after not eating all night so you may prefer to eat your larger meals and snacks in the morning and at lunch. Breakfast-type foods are often better tolerated so eat foods such as eggs, pancakes, waffles and cereal for any meal or snack. Carry snacks with you so you are prepared to eat every 2 to 3 hours. Determine what works best for you if your body's cues for feeling hungry or full are not working. Eat a small meal or snack even if you don't feel hungry. Set a timer to remind you when it is time to eat. Take a walk before you eat (with health care provider's approval). Light or moderate physical activity can help you maintain muscle and increase your appetite.  Make Eating Enjoyable  Taking steps to make the experience enjoyable may help to increase your interest in eating and improve your appetite.  Strategies:  Eat with others whenever possible. Include your favorite foods to make meals more enjoyable. Try new foods. Save your beverage for the end of the meal so that you have more  room for food before you get full.  Add Calories to Your Meals and Snacks  Try adding calorie-dense foods so that each bite provides more nutrition.  Strategies  Drink milk, chocolate milk, soy milk, or smoothies instead of low-calorie beverages such as diet drinks or water. Cook with milk or soy milk instead of water when making dishes such as hot cereal, cocoa, or pudding. Add jelly, jam, honey, butter or margarine to bread and crackers. Add jam or fruit to ice cream and as a topping over cake. Mix dried fruit, nuts, granola, honey, or dry cereal with yogurt or hot cereals. Enjoy snacks such as milkshakes, smoothies, pudding, ice cream, or custard. Blend a fruit smoothie of a banana, frozen berries, milk or soy milk, and 1 tablespoon nonfat powdered milk or protein powder.  Add Protein to Your Meals and Snacks  Choose at least one protein food at each meal and snack to increase your daily intake.  Strategies  Add  cup nonfat dry milk powder or protein powder to make a high-protein milk to drink or to use in recipes that call for milk. Vanilla or peppermint extract or unsweetened cocoa powder could help to boost the flavor. Add hard-cooked eggs, leftover meat, grated cheese, canned beans or tofu to noodles, rice, salads, sandwiches, soups, casseroles, pasta, tuna and other mixed dishes. Add powdered milk or protein powder to hot cereals, meatloaf, casseroles, scrambled eggs, sauces, cream soups, and shakes. Add beans and lentils to salads, soups, casseroles, and vegetable dishes. Eat cottage cheese or yogurt, especially  [place] yogurt, with fruit as a snack or dessert. Eat peanut or other nut butters on crackers, bread, toast, waffles, apples, bananas or celery sticks. Add it to milkshakes, smoothies, or desserts. Consider a ready-made protein shake.   Add Fats to Your Meals and Snacks  Try adding fats to your meals and snacks. Fat provides more calories in fewer bites than  carbohydrate or protein and adds flavors to your foods.  Strategies  Snack on nuts and seeds or add them to foods like salads, pasta, cereals, yogurt, and ice cream.  Saut or stir-fry vegetables, meats, chicken, fish or tofu in olive or canola oil.  Add olive oil, other vegetable oils, butter or margarine to soups, vegetables, potatoes, cooked cereal, rice, pasta, bread, crackers, pancakes, or waffles. Snack on olives or add to pasta, pizza, or salad. Add avocado or guacamole to your salads, sandwiches, and other entrees. Include fatty fish such as salmon in your weekly meal plan.  Tips For Adding Protein Nutrition Therapy    Tips Add extra egg to one or more meals  Increase the portion of milk to drink and change to skim milk if able  Include [place] yogurt or cottage cheese for snack or part of a meal  Increase portion size of protein entre and decrease portion of starch/bread  Mix protein powder, nut butter, almond/nut milk, non-fat dry milk, or Greek yogurt to shakes and smoothies  Use these ingredients also in baked goods or other recipes Use double the amount of sandwich filling  Add protein foods to all snacks including cheese, nut butters, milk and yogurt  Food Tips for Including Protein  Beans Cook and use dried peas, beans, and tofu in soups or add to casseroles, pastas, and grain dishes that also contain cheese or meat  Mash with cheese and milk  Use tofu to make smoothies  Commercial Protein Supplements Use nutritional supplements or protein powder sold at pharmacies and grocery stores  Use protein powder in milk drinks and desserts, such as pudding  Mix with ice cream, milk, and fruit or other flavorings for a high-protein milkshake  Cottage Cheese or [employer] Mix with or use to stuff fruits and vegetables  Add to casseroles, spaghetti, noodles, or egg dishes such as omelets, scrambled eggs, and souffls  Use gelatin, pudding-type desserts, cheesecake, and pancake  or waffle batter  Use to stuff crepes, pasta shells, or manicotti  Puree and use as a substitute for sour cream  Eggs, Egg whites, and Egg Yolks Add chopped, hard-cooked eggs to salads and dressings, vegetables, casseroles, and creamed meats  Beat eggs into mashed potatoes, vegetable purees, and sauces  Add extra egg whites to quiches, scrambled eggs, custards, puddings, pancake batter, or [place] toast wash/batter  Make a rich custard with egg yolks, double strength milk, and sugar  Add extra hard-cooked yolks to deviled egg filling and sandwich spreads  Hard or Semi-Soft Cheese (Cheddar, Barnabas Lister, Velva) Melt on sandwiches, bread, muffins, tortillas, hamburgers, hot dogs, other meats or fish, vegetables, eggs, or desserts such as stewed figs or pies  Grate and add to soups, sauces, casseroles, vegetable dishes, potatoes, rice noodles, or meatloaf  Serve as a snack with crackers or bagels  Ice cream, Yogurt, and Frozen Yogurt Add to milk drinks such as milkshakes  Add to cereals, fruits, gelatin desserts, and pies  Blend or whip with soft or cooked fruits  Sandwich ice cream or frozen yogurt between enriched cake slices, cookies, or graham crackers  Use seasoned  yogurt as a dip for fruits, vegetables, or chips  Use yogurt in place of sour cream in casseroles  Meat and Fish Add chopped, cooked meat or fish to vegetables, salads, casseroles, soups, sauces, and biscuit dough  Use in omelets, souffls, quiches, and sandwich fillings  Add chicken and [place] to stuffing  Wrap in pie crust or biscuit dough as turnovers  Add to stuffed baked potatoes  Add pureed meat to soups  Milk Use in beverages and in cooking  Use in preparing foods, such as hot cereal, soups, cocoa, or pudding  Add cream sauces to vegetable and other dishes  Use evaporated milk, evaporated skim milk, or sweetened condensed milk instead of milk or water in recipes.  Nonfat Dry Milk Add 1/3 cup of nonfat dry milk powdered milk to  each cup of regular milk for "double strength" milk  Add to yogurt and milk drinks, such as pasteurized eggnog and milkshakes  Add to scrambled eggs and mashed potatoes  Use in casseroles, meatloaf, hot cereal, breads, muffins, sauces, cream soups, puddings and custards, and other milk-based desserts  Nuts, Seeds, and Wheat Germ Add to casseroles, breads, muffins, pancakes, cookies, and waffles  Sprinkle on fruit, cereal, ice cream, yogurt, vegetables, salads, and toast as a crunchy topping  Use in place of breadcrumbs  Blend with parsley or spinach, herbs, and cream for a noodle, pasta, or vegetable sauce.  Roll banana in chopped nuts  Peanut Butter Spread on sandwiches, toast, muffins, crackers, waffles, pancakes, and fruit slices  Use as a dip for raw vegetables, such as carrots, cauliflower, and celery  Blend with milk drinks, smoothies, and other beverages  Swirl through soft ice cream or yogurt  Spread on a banana then roll in crushed, dry cereal or chopped nuts   Small Meal and Snack Ideas  These snacks and meals are recommended when you have to eat but aren't necessarily hungry.  They are good choices because they are high in protein and high in calories.   2 graham crackers, 2 tablespoons peanut or other nut butter, 1 cup milk  cup [place] yogurt,  cup fruit,  cup granola 2 deviled egg halves, 5 whole wheat crackers 1 cup cream of tomato soup,  grilled cheese sandwich 1 toasted waffle topped with: 2 tablespoons peanut or nut butter, 1 tablespoon jam Trail mix made with:  cup nuts,  cup dried fruit,  cup cold cereal, any variety  cup oatmeal or cream of wheat cereal, 1 tablespoon peanut or nut butter,  cup diced fruit  High-Calorie, High-Protein Sample 1-Day Menu   Breakfast 1 egg, scrambled 1 ounce cheddar cheese 1 English muffin, whole wheat 1 tablespoon margarine 1 tablespoon jam  cup orange juice, fortified with calcium and vitamin D  Morning Snack 1  tablespoon peanut butter 1 banana 1 cup 1% milk  Lunch Tuna salad sandwich made with: 2 slices bread, whole wheat 3 ounces tuna mixed with: 1 tablespoon mayonnaise  cup pudding  Afternoon Snack  cup hummus  cup carrots 1 pita  Evening Meal Enchilada casserole made with: 2 corn tortillas 3 ounces ground beef, cooked  cup black beans, cooked  cup corn, cooked 1 ounce grated cheddar cheese  cup enchilada sauce  avocado, sliced, topping for enchilada 1 tablespoon sour cream, topping for enchilada Salad:  cup lettuce, shredded  cup tomatoes, chopped, for salad 1 tablespoon olive oil and vinegar dressing, for salad  Evening Snack  cup Greek yogurt  cup blueberries  cup granola  Copyright 2020  Academy of Nutrition and Dietetics. All rights reserved     [facility] Stay Proper nutrition can help your body recover from illness and injury.   Foods and beverages high in protein, vitamins, and minerals help rebuild muscle loss, promote healing, & reduce fall risk.   In addition to eating healthy foods, a nutrition shake is an easy, delicious way to get the nutrition you need during and after your hospital stay  It is recommended that you continue to drink 2 bottles per day of: Ensure Plus or similar for at least 1 month (30 days) after your hospital stay   Tips for adding a nutrition shake into your routine: As allowed, drink one with vitamins or medications instead of water or juice Enjoy one as a tasty mid-morning or afternoon snack Drink cold or make a milkshake out of it Drink one instead of milk with cereal or snacks Use as a coffee creamer   Available at the following grocery stores and pharmacies:           * [place] [phone]            For COUPONS visit:  www.ensure.com/join or http://dawson-may.com/   Suggested Substitutions Ensure Plus = Boost Plus = Carnation Breakfast Essentials = Boost Compact Ensure Active Clear = Boost Breeze Glucerna Shake = Boost Glucose Control = Carnation Breakfast Essentials SUGAR FREE

## 2022-02-12 NOTE — Progress Notes (Signed)
Nutrition Follow-up  DOCUMENTATION CODES:   Not applicable  INTERVENTION:  - continue Ensure TID. - will enter High Calorie, High Protein handout in AVS. - will enter smart phrase in AVS to encourage 2 bottles of Ensure Plus, or similar, per day for at least 1 month after d/c.   NUTRITION DIAGNOSIS:   Increased nutrient needs related to chronic illness (lung cancer) as evidenced by estimated needs. -ongoing  GOAL:   Patient will meet greater than or equal to 90% of their needs -minimally met on average  MONITOR:   PO intake, Supplement acceptance  ASSESSMENT:   Pt with medical history significant of stage IVB non-small cell lung cancer, adenocarcinoma with large apical LUL mass, with mediastinal, right cervical and bilateral adrenal metastases, PE involving large right main on Lovenox (dx 12/2021), HTN, dementia, CKD 3a who presents with diarrhea  and weakness.  Flow sheet documentation indicates patient has been eating 0-30% at meals since dinner on 8/14. He has accepted Ensure 100% of the time offered.  Weight on 8/10 was 172 lb and weight on 8/14 was 167 lb. No other weight recordings this admission.  Plan is for d/c home.    Labs reviewed; CBG: 87 mg/dl, BUN: 24 mg/dl, Ca: 8.2 mg/dl. Medications reviewed; sliding scale novolog.   Diet Order:   Diet Order             Diet regular Room service appropriate? Yes; Fluid consistency: Thin  Diet effective now                   EDUCATION NEEDS:   No education needs have been identified at this time  Skin:  Skin Assessment: Skin Integrity Issues: Skin Integrity Issues:: Stage II Stage II: sacrum (newly documented on 8/17)  Last BM:  8/18 (type 6 x1, medium amount)  Height:   Ht Readings from Last 1 Encounters:  02/04/22 _0  (1.753 m)    Weight:   Wt Readings from Last 1 Encounters:  02/08/22 75.8 kg     BMI:  Body mass index is 24.68 kg/m.  Estimated Nutritional Needs:  Kcal:   2150-2350 Protein:  105-120 grams Fluid:  > 2 L     Jarome Matin, MS, RD, LDN, CNSC Registered Dietitian II Inpatient Clinical Nutrition RD pager # and on-call/weekend pager # available in [facility]

## 2022-02-12 NOTE — Progress Notes (Signed)
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 The molecular studies by [email or web address] showed no actionable mutations but PD-L1 expression is 99%  He is currently receiving systemic treatment with carboplatin for an AUC of 5, Alimta 500 mg per metered squared, and Keytruda 200 mg IV every 3 weeks.  Has developed pancytopenia secondary to chemotherapy.  He is being treated for bacteremia.  Due to complete antibiotics tonight.  He is not febrile.  For the neutropenia, he will continue Granix 480 mcg subcu daily for a total of 3 days.  He will receive his second dose today I recommend a third dose tomorrow prior to potential discharge.  He has a lab appointment already scheduled in our office for next week.  He will keep his follow-up appointments as previously scheduled.  He has a pulmonary embolus involving large right main and multiple peripheral pulmonary emboli diagnosed in July 2023.  He is currently on treatment with Lovenox 1 Mg/KG every 12 hours.  He will continue on this treatment for at least 1-2 months before switching him to Eliquis orally.  Mikey Bussing  SUBJECTIVE: Mr. Grimley is followed by our office for stage IVb non-small cell lung cancer, adenocarcinoma.  Recently started on treatment with systemic chemotherapy consisting of carboplatin for an AUC of 5, Alimta 500 mg per metered squared, and Keytruda 200 mg IV every 3 weeks.  Admitted for community-acquired pneumonia/severe sepsis.  He is bacteremic.  Had  thoracentesis performed with 1.1 L of fluid removed.  Has developed pancytopenia secondary to recent chemotherapy.  Currently afebrile.  Received a dose of Granix last evening.  WBC and ANC still remain low.  He has no complaints this morning.  Denies bleeding.  Due to complete IV antibiotics later this evening.  Oncology History  Adenocarcinoma of left lung, stage 4 (HCC)  01/28/2022 Initial Diagnosis   Adenocarcinoma of left lung, stage 4 ([place])   01/28/2022 Cancer Staging   Staging form: Lung, AJCC 8th Edition - Clinical: Stage IVB (cT3, cN3, cM1c) - Signed by Curt Bears, MD on 01/28/2022   02/03/2022 -  Chemotherapy   Patient is on Treatment Plan : LUNG Carboplatin (5) + Pemetrexed (500) + Pembrolizumab (200) D1 q21d Induction x 4 cycles / Maintenance Pemetrexed (500) + Pembrolizumab (200) D1 q21d        REVIEW OF SYSTEMS:   Review of Systems  Constitutional:  Negative for chills and fever.  HENT: Negative.    Respiratory: Negative.    Cardiovascular: Negative.   Gastrointestinal: Negative.   Skin: Negative.   Neurological: Negative.   Endo/Heme/Allergies: Negative.     I have reviewed the past medical history, past surgical history, social history and family history with the patient and they are unchanged from previous note.   PHYSICAL EXAMINATION: ECOG PERFORMANCE STATUS: 2 - Symptomatic, <50% confined to bed  Vitals:   02/12/22 0613 02/12/22 0946  BP: (!) 171/99 (!) 169/99  Pulse: 99 97  Resp: 19   Temp: 98.5 F (36.9 C)   SpO2: (!) 88%    Filed Weights   02/04/22 2050 02/08/22 1702  Weight: 78.3 kg 75.8 kg    Intake/Output from previous day: 08/17 0701 - 08/18 0700 In: 520 [P.O.:320; IV Piggyback:200] Out: 425 [Urine:425]  Physical Exam Vitals reviewed.  Constitutional:      General: He is not in acute distress. HENT:     Head: Normocephalic.  Eyes:     General: No scleral icterus.    Conjunctiva/sclera: Conjunctivae normal.  Cardiovascular:      Rate and Rhythm: Normal rate.  Pulmonary:     Effort: Pulmonary effort is normal. No respiratory distress.  Abdominal:     Palpations: Abdomen is soft.     Tenderness: There is no abdominal tenderness.  Skin:    General: Skin is warm and dry.  Neurological:     Mental Status: He is alert and oriented to person, place, and time.     LABORATORY DATA:  I have reviewed the data as listed    Latest Ref Rng & Units 02/11/2022    3:15 AM 02/10/2022    4:55 AM 02/08/2022    3:48 AM  CMP  Glucose 70 - 99 mg/dL 94  92  97   BUN 8 - 23 mg/dL $Remove'24  24  29   'ahaYdlh$ Creatinine 0.61 - 1.24 mg/dL 0.95  1.10  1.25   Sodium 135 - 145 mmol/L 140  144  143   Potassium 3.5 - 5.1 mmol/L 3.6  3.3  3.5   Chloride 98 - 111 mmol/L 108  110  116   CO2 22 - 32 mmol/L $RemoveB'26  28  22   'SHBtgRIY$ Calcium 8.9 - 10.3 mg/dL 8.2  8.4  8.1     Lab Results  Component Value Date   WBC 1.0 (LL) 02/12/2022   HGB 11.5 (L) 02/12/2022   HCT 33.6 (L) 02/12/2022   MCV 91.1 02/12/2022   PLT 55 (L) 02/12/2022   NEUTROABS 0.6 (L) 02/12/2022    No results found for: "CEA1", "CEA", "CAN199", "CA125", "PSA1"  IR IMAGING GUIDED PORT INSERTION  Result Date: 02/08/2022 INDICATION: LEFT lung cancer. EXAM: IMPLANTED PORT A CATH PLACEMENT WITH ULTRASOUND AND FLUOROSCOPIC GUIDANCE MEDICATIONS: None ANESTHESIA/SEDATION: Moderate (conscious) sedation was employed during this procedure. A total of Versed 1 mg and Fentanyl 100 mcg was administered intravenously. Moderate Sedation Time: 36 minutes. The patient's level of consciousness and vital signs were monitored continuously by radiology nursing throughout the procedure under my direct supervision. FLUOROSCOPY TIME:  Fluoroscopic dose; 1 mGy COMPLICATIONS: None immediate. PROCEDURE: The procedure, risks, benefits, and alternatives were explained to the patient. Questions regarding the procedure were encouraged and answered. The patient understands and consents to the procedure. The RIGHT neck and chest  were prepped with chlorhexidine in a sterile fashion, and a sterile drape was applied covering the operative field. Maximum barrier sterile technique with sterile gowns and gloves were used for the procedure. A timeout was performed prior to the initiation of the procedure. Local anesthesia was provided with 1% lidocaine with epinephrine. After creating a small venotomy incision, a micropuncture kit was utilized to access the external jugular vein under direct, real-time ultrasound guidance. Ultrasound image documentation was performed. The microwire was kinked to measure appropriate catheter length. A subcutaneous port pocket was then created along the upper chest wall utilizing a combination of sharp and blunt dissection. The pocket was irrigated with sterile saline. A single lumen ISP power injectable port was chosen for placement. The 8 Fr catheter was tunneled from the port pocket site to the venotomy incision. The port was placed  in the pocket. The external catheter was trimmed to appropriate length. At the venotomy, an 8 Fr peel-away sheath was placed over a guidewire under fluoroscopic guidance. The catheter was then placed through the sheath and the sheath was removed. Final catheter positioning was confirmed and documented with a fluoroscopic spot radiograph. The port was accessed with a Huber needle, aspirated and flushed with heparinized saline. The port pocket incision was closed with interrupted 3-0 Vicryl suture then Dermabond was applied, including at the venotomy incision. Dressings were placed. The patient tolerated the procedure well without immediate post procedural complication. FINDINGS: Thrombosed RIGHT internal jugular vein. Patent RIGHT external jugular vein and EJ-subclavian confluence. IMPRESSION: Successful placement of a RIGHT external jugular approach power injectable Port-A-Cath. The tip of the catheter is positioned within the proximal RIGHT atrium. The catheter is ready for immediate  use. Michaelle Birks, MD [facility] Radiology Electronically Signed   By: Michaelle Birks M.D.   On: 02/08/2022 17:08   DG CHEST PORT 1 VIEW  Result Date: 02/06/2022 CLINICAL DATA:  Increased breath sounds EXAM: PORTABLE CHEST 1 VIEW COMPARISON:  02/05/2022 FINDINGS: Cardiac shadow is enlarged but stable. Mediastinum is again prominent similar to that seen on the prior exam consistent with the known left upper lobe mass lesion. Persistent left-sided pleural effusion is noted stable in appearance from the prior exam. No pneumothorax is seen. Right lung remains clear. No acute bony abnormality is noted. IMPRESSION: Persistent left-sided pleural effusions stable from the previous day. Known left upper lobe mass lesion medially. Electronically Signed   By: Inez Catalina M.D.   On: 02/06/2022 19:36   DG Chest Port 1 View  Result Date: 02/05/2022 CLINICAL DATA:  Left thoracentesis EXAM: PORTABLE CHEST 1 VIEW COMPARISON:  Radiograph 02/04/2022 FINDINGS: Significantly decreased left pleural effusion, now small to moderate size after thoracentesis. No evidence of pneumothorax. Unchanged cardiomediastinal silhouette. Unchanged large left upper lobe mass. IMPRESSION: No evidence of pneumothorax after thoracentesis. Decreased size of the left pleural effusion. Electronically Signed   By: Maurine Simmering M.D.   On: 02/05/2022 14:05   US THORACENTESIS ASP PLEURAL SPACE W/IMG GUIDE  Result Date: 02/05/2022 INDICATION: Patient with history of metastatic lung cancer with large left pleural effusion. For diagnostic and therapeutic left thoracentesis. EXAM: ULTRASOUND GUIDED DIAGNOSTIC AND THERAPEUTIC LEFT THORACENTESIS MEDICATIONS: 10 mL 1 % lidocaine COMPLICATIONS: None immediate. PROCEDURE: An ultrasound guided thoracentesis was thoroughly discussed with the patient and questions answered. The benefits, risks, alternatives and complications were also discussed. The patient  understands and wishes to proceed with the procedure. Written consent was obtained by patient's wife. Ultrasound was performed to localize and mark an adequate pocket of fluid in the left chest. The area was then prepped and draped in the normal sterile fashion. 1% Lidocaine was used for local anesthesia. Under ultrasound guidance a 6 Fr Safe-T-Centesis catheter was introduced. Thoracentesis was performed. The catheter was removed and a dressing applied. FINDINGS: A total of approximately 1.1 L of clear, amber fluid was removed. Samples were sent to the laboratory as requested by the clinical team. IMPRESSION: Successful ultrasound guided left thoracentesis yielding 1.1 L of pleural fluid. Read by: Narda Rutherford, AGNP-BC Electronically Signed   By: Corrie Mckusick D.O.   On: 02/05/2022 14:01   CT Head Wo Contrast  Result Date: 02/04/2022 CLINICAL DATA:  Generalized weakness. EXAM: CT HEAD WITHOUT CONTRAST CT CERVICAL SPINE WITHOUT CONTRAST TECHNIQUE: Multidetector CT imaging of the head and cervical spine was performed following  the standard protocol without intravenous contrast. Multiplanar CT image reconstructions of the cervical spine were also generated. RADIATION DOSE REDUCTION: This exam was performed according to the departmental dose-optimization program which includes automated exposure control, adjustment of the mA and/or kV according to patient size and/or use of iterative reconstruction technique. COMPARISON:  12/30/2021 CT head, 01/24/2022 MRI head; no prior cervical spine CT, correlation is made with 04/17/2013 cervical spine radiographs FINDINGS: CT HEAD FINDINGS Evaluation is somewhat limited by motion artifact. Brain: No evidence of acute infarct, hemorrhage, mass, mass effect, or midline shift. No hydrocephalus or extra-axial fluid collection. Remote left occipital infarct. Redemonstrated lacunar infarcts in the left basal ganglia, left corona radiata, and bilateral thalami. Periventricular white  matter changes, likely the sequela of chronic small vessel ischemic disease. Vascular: No hyperdense vessel. Skull: Normal. Negative for fracture or focal lesion. Sinuses/Orbits: Partial opacification of the posterior left ethmoid air cells and left sphenoid sinus. Status post bilateral lens replacements. Other: The mastoid air cells are well aerated. CT CERVICAL SPINE FINDINGS Alignment: Straightening of the normal cervical lordosis. No listhesis. Skull base and vertebrae: No acute fracture. No primary bone lesion or focal pathologic process. Soft tissues and spinal canal: No prevertebral fluid or swelling. No visible canal hematoma. Disc levels: Mild degenerative changes for age. No high-grade spinal canal stenosis. Upper chest: Left apical mass, which extends into the inferior left neck, which is better evaluated on the 01/15/2022 CT chest. Moderate right and large left pleural effusions, new on the right and increased on the left compared to 01/15/2022 Other: Presumed nodal metastasis in the right neck (series 4, image 40) is poorly evaluated in the absence of intravenous contrast. Additional prominent right level 2 lymph nodes (series 4, image 53) and left level 3 lymph node (series 4, image 80). IMPRESSION: 1.  No acute intracranial process. 2.  No acute fracture or traumatic listhesis in the cervical spine. 3. Known left apical mass is incompletely evaluated. Moderate right and large left pleural effusions. 4. Presumed nodal metastases in the bilateral cervical lymph nodes. Electronically Signed   By: Merilyn Baba M.D.   On: 02/04/2022 16:19   CT Cervical Spine Wo Contrast  Result Date: 02/04/2022 CLINICAL DATA:  Generalized weakness. EXAM: CT HEAD WITHOUT CONTRAST CT CERVICAL SPINE WITHOUT CONTRAST TECHNIQUE: Multidetector CT imaging of the head and cervical spine was performed following the standard protocol without intravenous contrast. Multiplanar CT image reconstructions of the cervical spine were  also generated. RADIATION DOSE REDUCTION: This exam was performed according to the departmental dose-optimization program which includes automated exposure control, adjustment of the mA and/or kV according to patient size and/or use of iterative reconstruction technique. COMPARISON:  12/30/2021 CT head, 01/24/2022 MRI head; no prior cervical spine CT, correlation is made with 04/17/2013 cervical spine radiographs FINDINGS: CT HEAD FINDINGS Evaluation is somewhat limited by motion artifact. Brain: No evidence of acute infarct, hemorrhage, mass, mass effect, or midline shift. No hydrocephalus or extra-axial fluid collection. Remote left occipital infarct. Redemonstrated lacunar infarcts in the left basal ganglia, left corona radiata, and bilateral thalami. Periventricular white matter changes, likely the sequela of chronic small vessel ischemic disease. Vascular: No hyperdense vessel. Skull: Normal. Negative for fracture or focal lesion. Sinuses/Orbits: Partial opacification of the posterior left ethmoid air cells and left sphenoid sinus. Status post bilateral lens replacements. Other: The mastoid air cells are well aerated. CT CERVICAL SPINE FINDINGS Alignment: Straightening of the normal cervical lordosis. No listhesis. Skull base and vertebrae: No acute fracture. No  primary bone lesion or focal pathologic process. Soft tissues and spinal canal: No prevertebral fluid or swelling. No visible canal hematoma. Disc levels: Mild degenerative changes for age. No high-grade spinal canal stenosis. Upper chest: Left apical mass, which extends into the inferior left neck, which is better evaluated on the 01/15/2022 CT chest. Moderate right and large left pleural effusions, new on the right and increased on the left compared to 01/15/2022 Other: Presumed nodal metastasis in the right neck (series 4, image 40) is poorly evaluated in the absence of intravenous contrast. Additional prominent right level 2 lymph nodes (series 4,  image 53) and left level 3 lymph node (series 4, image 80). IMPRESSION: 1.  No acute intracranial process. 2.  No acute fracture or traumatic listhesis in the cervical spine. 3. Known left apical mass is incompletely evaluated. Moderate right and large left pleural effusions. 4. Presumed nodal metastases in the bilateral cervical lymph nodes. Electronically Signed   By: Merilyn Baba M.D.   On: 02/04/2022 16:19   DG Chest 1 View  Result Date: 02/04/2022 CLINICAL DATA:  Syncope EXAM: CHEST  1 VIEW COMPARISON:  Chest x-ray dated January 22, 2022 FINDINGS: Visualized cardiac and mediastinal contours are within normal limits. Left suprahilar mass compatible with known malignancy. New large right pleural effusion. Evidence of pneumothorax. IMPRESSION: Left suprahilar mass with new large left pleural effusion. Electronically Signed   By: Yetta Glassman M.D.   On: 02/04/2022 15:42   NM PET Image Initial (PI) Skull Base To Thigh (F-18 FDG)  Result Date: 01/27/2022 CLINICAL DATA:  Initial treatment strategy for non-small cell lung cancer. EXAM: NUCLEAR MEDICINE PET SKULL BASE TO THIGH TECHNIQUE: 7.9 mCi F-18 FDG was injected intravenously. Full-ring PET imaging was performed from the skull base to thigh after the radiotracer. CT data was obtained and used for attenuation correction and anatomic localization. Fasting blood glucose: 126 mg/dl COMPARISON:  Chest CT 01/15/2022 FINDINGS: Mediastinal blood pool activity: SUV max 2.4 Liver activity: SUV max NA NECK: Enlarged hypermetabolic RIGHT level II lymph node with SUV max equal 13.8 on image 24. Node measures 16 mm beneath sternocleidomastoid Hypermetabolic RIGHT supraglottic tissue adjacent to the epiglottis with SUV max equal 12.9 Incidental CT findings: none CHEST: Large LEFT upper lobe mass bordering the mediastinum measuring 10.5 x 6.1 cm. The mass is intensely hypermetabolic peripherally SUV max equal 20.4 Evidence of direct extension into the AP window with 3.8  cm mass with SUV max equal 17.2 Large layering LEFT effusion. Within the layering effusion there is hypermetabolic lesion medially along the pleural surface with SUV max equal 18.4 on image 106 Incidental CT findings: none ABDOMEN/PELVIS: Bilateral hypermetabolic adrenal metastasis. The LEFT adrenal gland is enlarged to 3 cm with SUV max equal 12.4 Two hypermetabolic peritoneal implants. One in the RIGHT abdomen measures 2.2 cm (142) with SUV max equal 14.3. Smaller lesion in the ventral peritoneal space on the LEFT measures 10 mm on image 148 SUV max equal 12.5. No evidence of bowel obstruction. Incidental CT findings: none SKELETON: Single focus of metabolic activity in the LEFT inferior pubic ramus with SUV max equal 12.4 on image 90. No CT correlation Incidental CT findings: none IMPRESSION: 1. Large LEFT upper lobe mass with intense metabolic activity bordering the mediastinum and transverse arch of the aorta consistent with bronchogenic carcinoma 2. AP window hypermetabolic mass. Pleural metastasis in the LEFT lower lobe. 3. Hypermetabolic RIGHT level II metastatic cervical lymph nodes. 4. Hypermetabolic RIGHT supraglottic tissue. Unusual location for metastatic  disease. Cannot exclude a primary lesion. 5. Bilateral hyper enlarged hypermetabolic adrenal metastasis. 6. Two  hypermetabolic peritoneal metastatic implants the abdomen 7. Single hypermetabolic skeletal metastasis to the LEFT inferior pubic ramus. Electronically Signed   By: Suzy Bouchard M.D.   On: 01/27/2022 16:49   MR BRAIN W WO CONTRAST  Result Date: 01/25/2022 CLINICAL DATA:  Non-small cell lung cancer staging EXAM: MRI HEAD WITHOUT AND WITH CONTRAST TECHNIQUE: Multiplanar, multiecho pulse sequences of the brain and surrounding structures were obtained without and with intravenous contrast. CONTRAST:  61mL GADAVIST GADOBUTROL 1 MMOL/ML IV SOLN COMPARISON:  01/16/2022 FINDINGS: Brain: No enhancement or edema to suggest metastatic disease.  No recent infarction, hemorrhage, hydrocephalus, extra-axial collection or mass lesion. Advanced chronic small vessel ischemia with ischemic gliosis and chronic small vessel infarcts in the left basal ganglia, bilateral thalamus, and right pons. Small remote left occipital cortex infarct. Chronic microhemorrhages in the deep brain attributed to the same. Vascular: Major flow voids and vascular enhancements are preserved Skull and upper cervical spine: No focal marrow lesion. Sinuses/Orbits: Negative Other: Partially covered mass in the right lateral neck with heterogeneous/necrotic appearance and 3.4 cm size, likely nodal. IMPRESSION: 1. Heterogeneous mass in the right lateral neck, presumably nodal metastasis. 2. Negative for intracranial metastasis. 3. Advanced chronic small vessel disease. Electronically Signed   By: Jorje Guild M.D.   On: 01/25/2022 14:33   MR BRAIN WO CONTRAST  Result Date: 01/16/2022 CLINICAL DATA:  Non-small cell lung cancer staging EXAM: MRI HEAD WITHOUT CONTRAST TECHNIQUE: Multiplanar, multiecho pulse sequences of the brain and surrounding structures were obtained without intravenous contrast. COMPARISON:  Head CT 12/30/2021 FINDINGS: Brain: No swelling or masslike finding to suggest metastatic disease. Chronic small vessel ischemic gliosis in the cerebral white matter with chronic perforator infarct at the left basal ganglia. Chronic lacunar infarcts in the bilateral thalami with numerous remote micro hemorrhages in the deep brain. Small remote left occipital and right parietal cortex infarcts. Wallerian changes seen in the bilateral middle cerebellar peduncle associated with a chronic right pontine lacune. No acute hemorrhage, acute infarct, hydrocephalus, or collection. Vascular: Major flow voids are preserved Skull and upper cervical spine: Normal marrow signal Sinuses/Orbits: Negative IMPRESSION: 1. Limited for staging purposes due to lack of IV contrast. No gross masslike  features. 2. Advanced chronic small vessel ischemia. Electronically Signed   By: Jorje Guild M.D.   On: 01/16/2022 12:07   VAS [place] LOWER EXTREMITY VENOUS (DVT)  Result Date: 01/16/2022  Lower Venous DVT Study Patient Name:  HELEN CUFF  Date of Exam:   01/15/2022 Medical Rec #: [id number]          Accession #:    [id number] Date of Birth: [birth year]           Patient Gender: M Patient Age:   58 years Exam Location:  [facility] Procedure:      VAS [place] LOWER EXTREMITY VENOUS (DVT) Referring Phys: Nevin Bloodgood SIMPSON --------------------------------------------------------------------------------  Indications: Pulmonary embolism.  Risk Factors: Confirmed PE. Anticoagulation: Heparin. Comparison Study: No prior studies. Performing Technologist: Oliver Hum RVT  Examination Guidelines: A complete evaluation includes B-mode imaging, spectral Doppler, color Doppler, and power Doppler as needed of all accessible portions of each vessel. Bilateral testing is considered an integral part of a complete examination. Limited examinations for reoccurring indications may be performed as noted. The reflux portion of the exam is performed with the patient in reverse Trendelenburg.  +---------+---------------+---------+-----------+----------+--------------+ RIGHT    CompressibilityPhasicitySpontaneityPropertiesThrombus Aging +---------+---------------+---------+-----------+----------+--------------+ CFV  Full           Yes      Yes                                 +---------+---------------+---------+-----------+----------+--------------+ SFJ      Full                                                        +---------+---------------+---------+-----------+----------+--------------+ FV Prox  Full                                                        +---------+---------------+---------+-----------+----------+--------------+ FV Mid   Full                                                         +---------+---------------+---------+-----------+----------+--------------+ FV DistalPartial        Yes      No                   Acute          +---------+---------------+---------+-----------+----------+--------------+ PFV      Full                                                        +---------+---------------+---------+-----------+----------+--------------+ POP      None           No       No                   Acute          +---------+---------------+---------+-----------+----------+--------------+ PTV      Full                                                        +---------+---------------+---------+-----------+----------+--------------+ PERO     Partial                                      Acute          +---------+---------------+---------+-----------+----------+--------------+ Soleal   Partial                                      Acute          +---------+---------------+---------+-----------+----------+--------------+   +---------+---------------+---------+-----------+----------+--------------+ LEFT     CompressibilityPhasicitySpontaneityPropertiesThrombus Aging +---------+---------------+---------+-----------+----------+--------------+ CFV      Full           Yes  Yes                                 +---------+---------------+---------+-----------+----------+--------------+ SFJ      Full                                                        +---------+---------------+---------+-----------+----------+--------------+ FV Prox  Full                                                        +---------+---------------+---------+-----------+----------+--------------+ FV Mid   Full                                                        +---------+---------------+---------+-----------+----------+--------------+ FV DistalFull                                                         +---------+---------------+---------+-----------+----------+--------------+ PFV      Full                                                        +---------+---------------+---------+-----------+----------+--------------+ POP      Full           Yes      Yes                                 +---------+---------------+---------+-----------+----------+--------------+ PTV      Full                                                        +---------+---------------+---------+-----------+----------+--------------+ PERO     Full                                                        +---------+---------------+---------+-----------+----------+--------------+     Summary: RIGHT: - Findings consistent with acute deep vein thrombosis involving the right femoral vein, right popliteal vein, right peroneal veins, and right soleal veins. - No cystic structure found in the popliteal fossa.  LEFT: - There is no evidence of deep vein thrombosis in the lower extremity.  - No cystic structure found in the popliteal fossa.  *See table(s) above for measurements and observations. Electronically signed by Orlie Pollen on 01/16/2022  at 12:00:16 PM.    Final    ECHOCARDIOGRAM COMPLETE  Result Date: 01/15/2022    ECHOCARDIOGRAM REPORT   Patient Name:   ZAYDE STROUPE Date of Exam: 01/15/2022 Medical Rec #:  [id number]         Height:       69.0 in Accession #:    [id number]        Weight:       167.5 lb Date of Birth:  Mar 24, 1946          BSA:          1.916 m Patient Age:    63 years          BP:           125/97 mmHg Patient Gender: M                 HR:           93 bpm. Exam Location:  Inpatient Procedure: 2D Echo, Cardiac Doppler and Color Doppler Indications:    Pulmonary embolus  History:        Patient has prior history of Echocardiogram examinations. Risk                 Factors:Hypertension and Diabetes.  Sonographer:    Jyl Heinz Referring Phys: [id number] [place]  1. Cor pulmonale  with severe TR and RV dysfucntion in setting of clinical PE.  2. Left ventricular ejection fraction, by estimation, is 55%. The left ventricle has normal function. The left ventricle has no regional wall motion abnormalities. Left ventricular diastolic parameters are consistent with Grade I diastolic dysfunction (impaired relaxation).  3. Right ventricular systolic function is severely reduced. The right ventricular size is normal.  4. The mitral valve is abnormal. Trivial mitral valve regurgitation. No evidence of mitral stenosis.  5. Tricuspid valve regurgitation is severe.  6. The aortic valve is tricuspid. There is mild calcification of the aortic valve. There is mild thickening of the aortic valve. Aortic valve regurgitation is not visualized. Aortic valve sclerosis is present, with no evidence of aortic valve stenosis.  7. The inferior vena cava is dilated in size with >50% respiratory variability, suggesting right atrial pressure of 8 mmHg. FINDINGS  Left Ventricle: Left ventricular ejection fraction, by estimation, is 55%. The left ventricle has normal function. The left ventricle has no regional wall motion abnormalities. The left ventricular internal cavity size was normal in size. There is no left ventricular hypertrophy. Left ventricular diastolic parameters are consistent with Grade I diastolic dysfunction (impaired relaxation). Right Ventricle: The right ventricular size is normal. No increase in right ventricular wall thickness. Right ventricular systolic function is severely reduced. Left Atrium: Left atrial size was normal in size. Right Atrium: Right atrial size was normal in size. Pericardium: There is no evidence of pericardial effusion. Mitral Valve: The mitral valve is abnormal. There is mild thickening of the mitral valve leaflet(s). There is mild calcification of the mitral valve leaflet(s). Trivial mitral valve regurgitation. No evidence of mitral valve stenosis. Tricuspid Valve: The  tricuspid valve is normal in structure. Tricuspid valve regurgitation is severe. No evidence of tricuspid stenosis. Aortic Valve: The aortic valve is tricuspid. There is mild calcification of the aortic valve. There is mild thickening of the aortic valve. Aortic valve regurgitation is not visualized. Aortic valve sclerosis is present, with no evidence of aortic valve stenosis. Aortic valve peak gradient measures 3.3 mmHg. Pulmonic Valve: The pulmonic valve was  normal in structure. Pulmonic valve regurgitation is trivial. No evidence of pulmonic stenosis. Aorta: The aortic root is normal in size and structure. Venous: The inferior vena cava is dilated in size with greater than 50% respiratory variability, suggesting right atrial pressure of 8 mmHg. IAS/Shunts: No atrial level shunt detected by color flow Doppler. 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  LEFT VENTRICLE PLAX 2D LVIDd:         3.60 cm     Diastology LVIDs:         2.30 cm     LV e' medial:    5.33 cm/s LV PW:         1.30 cm     LV E/e' medial:  14.5 LV IVS:        1.40 cm     LV e' lateral:   5.98 cm/s LVOT diam:     2.00 cm     LV E/e' lateral: 12.9 LV SV:         40 LV SV Index:   21 LVOT Area:     3.14 cm  LV Volumes (MOD) LV vol d, MOD A2C: 76.4 ml LV vol d, MOD A4C: 72.7 ml LV vol s, MOD A2C: 28.5 ml LV vol s, MOD A4C: 30.2 ml LV SV MOD A2C:     47.9 ml LV SV MOD A4C:     72.7 ml LV SV MOD BP:      43.1 ml RIGHT VENTRICLE            IVC RV Basal diam:  3.70 cm    IVC diam: 1.90 cm RV Mid diam:    2.70 cm RV S prime:     8.27 cm/s TAPSE (M-mode): 1.8 cm LEFT ATRIUM           Index        RIGHT ATRIUM           Index LA diam:      2.70 cm 1.41 cm/m   RA Area:     18.20 cm LA Vol (A2C): 26.0 ml 13.57 ml/m  RA Volume:   54.90 ml  28.65 ml/m LA Vol (A4C): 38.7 ml 20.20 ml/m  AORTIC VALVE                 PULMONIC VALVE AV Area (Vmax): 2.87 cm     PR End Diast Vel: 1.41 msec AV Vmax:         91.50 cm/s AV Peak Grad:   3.3 mmHg LVOT Vmax:      83.60 cm/s LVOT Vmean:     66.400 cm/s LVOT VTI:       0.126 m  AORTA Ao Root diam: 3.60 cm Ao Asc diam:  3.70 cm MITRAL VALVE               TRICUSPID VALVE MV Area (PHT): 10.54 cm   TR Peak grad:   41.7 mmHg MV Decel Time: 72 msec     TR Vmax:        323.00 cm/s MV E velocity: 77.10 cm/s MV A velocity: 76.30 cm/s  SHUNTS MV E/A ratio:  1.01        Systemic VTI:  0.13 m                            Systemic Diam: 2.00 cm Jenkins Rouge MD Electronically signed by  Jenkins Rouge MD Signature Date/Time: 01/15/2022/2:01:41 PM    Final    CT Angio Chest PE W and/or Wo Contrast  Result Date: 01/15/2022 CLINICAL DATA:  Pulmonary embolus suspected with high probability. Productive cough, low oxygen saturation, recent diagnosis of lung cancer. EXAM: CT ANGIOGRAPHY CHEST WITH CONTRAST TECHNIQUE: Multidetector CT imaging of the chest was performed using the standard protocol during bolus administration of intravenous contrast. Multiplanar CT image reconstructions and MIPs were obtained to evaluate the vascular anatomy. RADIATION DOSE REDUCTION: This exam was performed according to the departmental dose-optimization program which includes automated exposure control, adjustment of the mA and/or kV according to patient size and/or use of iterative reconstruction technique. CONTRAST:  60mL OMNIPAQUE IOHEXOL 350 MG/ML SOLN COMPARISON:  12/30/2021 FINDINGS: Cardiovascular: Large filling defects are demonstrated in the right main pulmonary artery and extending into upper and lower lobe branches consistent with acute pulmonary embolus. Finding is new since prior study. Cardiac enlargement. No pericardial effusion. Elevated RV to LV ratio at 1.5. Normal caliber thoracic aorta. There is eccentric thrombus in the aortic arch and descending aorta, similar to prior study. Mediastinum/Nodes: Esophagus is decompressed. Lymphadenopathy in the left hilum and left paratracheal region, similar  to prior study. Thyroid gland is unremarkable. Lungs/Pleura: Large left apical mass extending to the left hilum and surrounding the left subclavian artery with partially surrounding the left carotid artery origin. Mass measures up to about 7.2 x 8 cm in diameter. This is likely to represent primary lung cancer. There is atelectasis or consolidation in the left upper lung and left lower lung. Moderate left pleural effusion. Patchy peripheral wedge-shaped opacities are demonstrated in the right upper lung, new since prior study, possibly peripheral infarcts. Upper Abdomen: Large left adrenal gland nodule measuring 3.8 cm diameter, unchanged since prior study and likely metastatic. Circumscribed low-attenuation lesion centrally in the liver measuring 3.6 cm diameter is unchanged since prior study, likely representing a benign cyst. Musculoskeletal: No chest wall abnormality. No acute or significant osseous findings. Review of the MIP images confirms the above findings. IMPRESSION: 1. Positive examination for pulmonary embolus with large right main and multiple peripheral pulmonary emboli. Elevated RV to LV ratio at 1.5 suggesting right heart strain. Positive for acute PE with CT evidence of right heart strain (RV/LV Ratio = 1.5) consistent with at least submassive (intermediate risk) PE. The presence of right heart strain has been associated with an increased risk of morbidity and mortality. Please refer to the "Code PE Focused" order set in EPIC. 2. Again demonstrated is a large left apical/hilar/mediastinal mass lesion likely representing primary lung cancer with lymphadenopathy and direct mediastinal invasion. 3.8 cm left adrenal gland nodule most likely represents a metastasis. 3. Moderate left pleural effusion with atelectasis or infiltration in the left lung. 4. Probable small peripheral infarcts in the right lung. Critical Value/emergent results were called by telephone at the time of interpretation on 01/15/2022  at 1:25 am to provider Tegler, who verbally acknowledged these results. Electronically Signed   By: Lucienne Capers M.D.   On: 01/15/2022 01:37   DG Chest Port 1 View  Result Date: 01/15/2022 CLINICAL DATA:  Shortness of breath, cough with bloody sputum. Recent diagnosis of lung cancer with biopsy. EXAM: PORTABLE CHEST 1 VIEW COMPARISON:  12/25/2021. FINDINGS: The heart size and mediastinal contours are stable. A consolidative opacity is noted in the left upper lobe at the paramediastinal border and is unchanged from the prior exam. Mild atelectasis is present at the left lung base. The right  lung is clear. No effusion or pneumothorax. No acute osseous abnormality. IMPRESSION: 1. Stable mass in the left upper lobe along the paramediastinal border. 2. Mild atelectasis at the left lung base. Electronically Signed   By: Brett Fairy M.D.   On: 01/15/2022 00:12     Future Appointments  Date Time Provider [place]  02/17/2022  1:45 PM [facility] [place] [facility] None  02/23/2022  9:15 AM [facility] [place] [facility] None  02/23/2022  9:45 AM Curt Bears, MD [facility] None  02/23/2022 10:30 AM [facility] INFUSION [facility] None  03/03/2022  1:00 PM [facility] [place] [facility] None  03/10/2022  2:00 PM [facility] [place] [facility] None  03/17/2022  9:00 AM [facility] [place] [facility] None  03/17/2022  9:30 AM Heilingoetter, Cassandra L, PA-C [facility] None  03/17/2022 10:30 AM [facility] INFUSION [facility] None  03/24/2022  1:00 PM [facility] [place] None  03/31/2022  1:30 PM [facility] [place] [facility] None  04/07/2022  8:30 AM [facility] [place] [facility] None  04/07/2022  9:00 AM Curt Bears, MD [facility] None  04/07/2022 10:00 AM [facility] INFUSION [facility] None  04/14/2022  1:00 PM [facility] [place] [facility] None  04/21/2022  1:00 PM [facility] [place] [facility] None  04/28/2022 11:00 AM [facility] [place] [facility] None  04/28/2022 11:30 AM  Heilingoetter, Cassandra L, PA-C [facility] None  04/28/2022 12:30 PM [facility] INFUSION [facility] None  05/13/2022 10:20 AM Hoyt Koch, MD [facility] None      LOS: 8 days

## 2022-02-12 NOTE — Plan of Care (Signed)
  Problem: Education: Goal: Knowledge of General Education information will improve Description: Including pain rating scale, medication(s)/side effects and non-pharmacologic comfort measures Outcome: Progressing   Problem: Clinical Measurements: Goal: Ability to maintain clinical measurements within normal limits will improve Outcome: Progressing Goal: Will remain free from infection Outcome: Progressing   Problem: Elimination: Goal: Will not experience complications related to bowel motility Outcome: Progressing   Problem: Safety: Goal: Ability to remain free from injury will improve Outcome: Progressing

## 2022-02-12 NOTE — Progress Notes (Signed)
Physical Therapy Treatment Patient Details Name: Drew Harrington MRN: [id number] DOB: 1945-08-17 Today's Date: 02/12/2022   History of Present Illness Pt is 76 yo male who presents with diarrhea  and weakness. PMH includes stage IVB non-small cell lung cancer, adenocarcinoma with large apical LUL mass, with mediastinal, right cervical and bilateral adrenal metastases, PE involving large right main on Lovenox (dx 12/2021), HTN, dementia, CKD 3a. He was also found to have large left pleural effusion, s/p left thoracentesis on 8/11.    PT Comments    General Comments: improved cognition able to recall falling in bathroom and aware of current situtation.  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  Slightly slow and groggy. But also pt was unaware he was incont BM while in bed. Assisted OOB to bathroom.  General transfer comment: asissted OOB as well as on/off toilet.  Pt did require increased assist from lower level toilet with heavy use grab bar to steady self.  Asisst for peri care. General Gait Details: tolerated amb an increased distance in hallwy with walker for safety as pt appears to be a "furniture walker" at home require assist for proper walker use and direction.  Avg RA was 92%. Assisted to recliner chair when pt started a coughing episode that triggered copious amount of bloody mucus along with what looked like bile stomach digested food.  Vomit. Used wall suction a good 4 min to clear mouth/throat while calling nurse to room.  Audible throat congestion with encouragement to have pt clear/cough while seated upright.  100 cc collected in wall suction canister.  Mix of mucus/blood/vomit. RA at 92%. Eyes tearing. Caution to aspiration.  Left pt upright in recliner with RN.  Lunch tray arrived.  Pt was able to eat/drink without an issue.   Pt plans to return home with family support.    Recommendations for follow up therapy are one component of a multi-disciplinary discharge planning  process, led by the attending physician.  Recommendations may be updated based on patient status, additional functional criteria and insurance authorization.  Follow Up Recommendations  Home health PT     Assistance Recommended at Discharge Frequent or constant Supervision/Assistance  Patient can return home with the following A little help with walking and/or transfers;A little help with bathing/dressing/bathroom;Assistance with cooking/housework;Assist for transportation;Help with stairs or ramp for entrance;Direct supervision/assist for financial management   Equipment Recommendations  None recommended by PT    Recommendations for Other Services       Precautions / Restrictions Precautions Precautions: Fall Precaution Comments: monitor O2 Restrictions Weight Bearing Restrictions: No     Mobility  Bed Mobility Overal bed mobility: Needs Assistance Bed Mobility: Supine to Sit     Supine to sit: Supervision     General bed mobility comments: self able with increased time.  Pt unaware he wad soiled self in bed with BM.    Transfers Overall transfer level: Needs assistance Equipment used: Rolling walker (2 wheels) Transfers: Sit to/from Stand, Bed to chair/wheelchair/BSC Sit to Stand: Min guard, Supervision Stand pivot transfers: Min guard, Min assist         General transfer comment: asissted OOB as well as on/off toilet.  Pt did require increased assist from lower level toilet with heavy use grab bar to steady self.  Asisst for peri care.    Ambulation/Gait Ambulation/Gait assistance: Supervision, Min guard Gait Distance (Feet): 85 Feet Assistive device: Rolling walker (2 wheels) Gait Pattern/deviations: Step-through pattern, Decreased stride length Gait velocity: decreased  General Gait Details: tolerated amb an increased distance in hallwy with walker for safety as pt appears to be a "furniture walker" at home require assist for proper walker use and  direction.  Avg RA was 92%.   Stairs             Wheelchair Mobility    Modified Rankin (Stroke Patients Only)       Balance                                            Cognition Arousal/Alertness: Awake/alert Behavior During Therapy: WFL for tasks assessed/performed Overall Cognitive Status: Within Functional Limits for tasks assessed                                 General Comments: improved cognition able to recall falling in bathroom and aware of current situtation.  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  Slightly slow and groggy. But also pt was unaware he was incont BM while in bed.        Exercises      General Comments        Pertinent Vitals/Pain Pain Assessment Pain Assessment: No/denies pain    Home Living                          Prior Function            PT Goals (current goals can now be found in the care plan section) Progress towards PT goals: Progressing toward goals    Frequency    Min 3X/week      PT Plan Current plan remains appropriate    Co-evaluation              AM-PAC PT "6 Clicks" Mobility   Outcome Measure  Help needed turning from your back to your side while in a flat bed without using bedrails?: A Little Help needed moving from lying on your back to sitting on the side of a flat bed without using bedrails?: A Little Help needed moving to and from a bed to a chair (including a wheelchair)?: A Little Help needed standing up from a chair using your arms (e.g., wheelchair or bedside chair)?: A Little Help needed to walk in hospital room?: A Little Help needed climbing 3-5 steps with a railing? : A Little 6 Click Score: 18    End of Session Equipment Utilized During Treatment: Gait belt Activity Tolerance: Patient tolerated treatment well Patient left: in chair;with chair alarm set;with call bell/phone within reach Nurse Communication: Mobility  status PT Visit Diagnosis: Unsteadiness on feet (R26.81);Muscle weakness (generalized) (M62.81)     Time: [id number] PT Time Calculation (min) (ACUTE ONLY): 39 min  Charges:  $Gait Training: 8-22 mins $Therapeutic Activity: 23-37 mins                     Rica Koyanagi  PTA Acute  Rehabilitation Services Office M-F          [phone] Weekend pager [phone]

## 2022-02-12 NOTE — TOC Progression Note (Signed)
Transition of Care [facility]) - Progression Note    Patient Details  Name: Drew Harrington MRN: [id number] Date of Birth: 01-26-1946  Transition of Care [facility]) CM/SW [place], RN Phone Number: 02/12/2022, 9:10 AM  Clinical Narrative:   RNCM received inbound call from patient's wife [name], who reports she has not spoke with DME supplier . This RNCM spoke with [employer] who will deliver DME today, [employer] driver will give her a call at [phone] to coordinate time.  TOC will continue to follow.  Expected Discharge Plan: [place] Barriers to Discharge: Continued Medical Work up  Expected Discharge Plan and Services Expected Discharge Plan: [place] In-house Referral: NA Discharge Planning Services: CM Consult Post Acute Care Choice: Durable Medical Equipment, Home Health Living arrangements for the past 2 months: [place]                 DME Arranged: Bedside commode, Hospital bed, Overbed table, Shower stool DME Agency: [employer] Date DME Agency Contacted: 02/09/22   Representative spoke with at DME Agency: [facility] HH Arranged: PT, Nurse's Aide, OT, Social Work [employer] Agency: [facility] Date [place]: 02/08/22   Representative spoke with at [place]: [place] (SDOH) Interventions    Readmission Risk Interventions    02/11/2022    2:01 PM 02/10/2022   10:02 AM  Readmission Risk Prevention Plan  Transportation Screening Complete   PCP or Specialist Appt within 3-5 Days Complete Complete  HRI or [place] Complete Complete  Social Work Consult for [place] Planning/Counseling Complete Complete  Palliative Care Screening  Not Applicable  Medication Review [employer]) Complete Complete

## 2022-02-12 NOTE — Progress Notes (Signed)
[facility] PROGRESS NOTE   Drew Harrington YHC:[id number] DOB: [birth year] DOA: 02/04/2022  PCP: Hoyt Koch, MD  Brief History/Interval Summary: 76 y.o. male with medical history significant of stage IVB non-small cell lung cancer, adenocarcinoma with large apical LUL mass, with mediastinal, right cervical and bilateral adrenal metastases, PE involving large right main on Lovenox (dx 12/2021), HTN, dementia, CKD 3a who presents with diarrhea  and weakness.  Patient just received first session of chemotherapy on 8/9 and then started to have diarrhea.  He slipped in the bathtub while trying to wash out and subsequently was sent to ED.  In the ED, patient had a fever, was tachycardic and tachypneic.  With elevated creatinine.  CT head and cervical spine were negative for injuries.  He was also found to have large left pleural effusion.  His Lovenox was switched to IV heparin in anticipation for thoracentesis.  Patient underwent thoracentesis 8/11 which removed 1.1 L fluid. Hospitalization further complicated by blood culture +Acinectobacter.    Consultants: Phone discussion with ID.  Medical oncology  Procedures: Thoracentesis    Subjective/Interval History: Patient very disappointed that he will not be able to go home today.  He was explained the reason for same.  His last dose of antibiotics will be late tonight.  Denies any complaints.  Occasional cough is present.       Assessment/Plan:  Community-acquired pneumonia/severe sepsis present on admission/chronic respiratory failure with hypoxia Sepsis was present on admission with fever, tachycardia, tachypnea with endorgan damage including acute kidney injury and respiratory failure. Patient treated with antibiotics.  Respiratory status has improved.  Remains on oxygen by nasal cannula which is what he uses at home as well.  Acinetobacter bacteremia Found in 1 aerobic bottle on 8/10.  Repeat cultures from 8/12  negative. Initially on cefepime and azithromycin.  Changed over initially to meropenem and then changed back to cefepime.  Plan is to treat for total of 7 days.  He will complete treatment on 8/18.   Susceptibility data is still pending.  Had to be sent out to [place].  Microbiology was called today and they said that it may take up to a week for these results to be available.  Since patient is afebrile 1 week of treatment should suffice although this plan is somewhat complicated by development of neutropenia from his chemotherapy.  Large left pleural effusion Underwent thoracentesis on 8/11 with removal of 1.1 L of fluid.  Was noted to be exudative. No malignant cells noted on cytology.  Cultures without any growth. Respiratory status is stable.  Acute kidney injury on chronic kidney disease stage IIIa/hypokalemia Resolved with IV fluids.  Baseline creatinine is 1.4. Potassium was supplemented.  We will recheck labs tomorrow.  Magnesium was 1.8.  Acute metabolic encephalopathy Multifactorial.  Appears to have resolved.  History of PE Noted to be on Lovenox prior to admission which is being continued.  Stage IV adenocarcinoma of left lung Followed by Dr. Julien Nordmann.  On systemic chemotherapy.  Port-A-Cath placed by interventional radiology.  Palliative care was consulted. Last chemotherapy on 8/9.  Does not appear that he received any marrow stimulating agents during that time.  Pancytopenia including normocytic anemia, leukopenia and thrombocytopenia Most likely due to chemotherapy that he received on 8/9.  Significant neutropenia.  Discussed with medical oncology.  He was started on Granix yesterday.  Neutrophil count slightly better today.  We will recheck labs tomorrow.  Platelet count noted to be 55,000.  No evidence of  overt bleeding.    Diabetes mellitus type 2, controlled HbA1c 6.3.  Continue SSI.  CBGs are reasonably well controlled.  Essential hypertension Losartan/HCTZ on hold  due to AKI.  Renal function back to baseline.  Blood pressure is noted to be elevated.  Lisinopril was resumed.  Will increase the dose of his metoprolol.  DVT Prophylaxis: On full dose Lovenox Code Status: DNR Family Communication: Wife was updated yesterday. Disposition Plan: Anticipate discharge tomorrow.  Status is: Inpatient Remains inpatient appropriate because: Need for IV antibiotics    Medications: Scheduled:  Chlorhexidine Gluconate Cloth  6 each Topical Daily   enoxaparin (LOVENOX) injection  70 mg Subcutaneous Q12H   feeding supplement  237 mL Oral BID BM   insulin aspart  0-9 Units Subcutaneous TID WC   losartan  100 mg Oral Daily   metoprolol succinate  25 mg Oral Once   [START ON 02/13/2022] metoprolol succinate  50 mg Oral Daily   sodium chloride flush  10-40 mL Intracatheter Q12H   Tbo-filgastrim (GRANIX) SQ  480 mcg Subcutaneous q1800   Continuous:  ceFEPime (MAXIPIME) IV 2 g (02/12/22 0527)   ZOX:[id number], mouth rinse, sodium chloride flush  Antibiotics: Anti-infectives (From admission, onward)    Start     Dose/Rate Route Frequency Ordered Stop   02/11/22 0800  ceFEPIme (MAXIPIME) 2 g in sodium chloride 0.9 % 100 mL IVPB        2 g 200 mL/hr over 30 Minutes Intravenous Every 8 hours 02/11/22 0727 02/12/22 2359   02/08/22 2200  ceFEPIme (MAXIPIME) 2 g in sodium chloride 0.9 % 100 mL IVPB  Status:  Discontinued        2 g 200 mL/hr over 30 Minutes Intravenous Every 12 hours 02/08/22 1317 02/11/22 0727   02/08/22 0930  meropenem (MERREM) 1 g in sodium chloride 0.9 % 100 mL IVPB  Status:  Discontinued        1 g 200 mL/hr over 30 Minutes Intravenous Every 8 hours 02/08/22 0830 02/08/22 1235   02/07/22 1430  meropenem (MERREM) 1 g in sodium chloride 0.9 % 100 mL IVPB  Status:  Discontinued        1 g 200 mL/hr over 30 Minutes Intravenous Every 12 hours 02/07/22 1337 02/08/22 0830   02/05/22 2200  ceFEPIme (MAXIPIME) 2 g in sodium chloride 0.9 % 100 mL  IVPB  Status:  Discontinued        2 g 200 mL/hr over 30 Minutes Intravenous Every 12 hours 02/05/22 1738 02/07/22 1337   02/05/22 1800  cefTRIAXone (ROCEPHIN) 2 g in sodium chloride 0.9 % 100 mL IVPB  Status:  Discontinued        2 g 200 mL/hr over 30 Minutes Intravenous Every 24 hours 02/04/22 2026 02/05/22 1738   02/05/22 1800  azithromycin (ZITHROMAX) 500 mg in sodium chloride 0.9 % 250 mL IVPB  Status:  Discontinued        500 mg 250 mL/hr over 60 Minutes Intravenous Every 24 hours 02/04/22 2026 02/07/22 1338   02/04/22 1800  cefTRIAXone (ROCEPHIN) 1 g in sodium chloride 0.9 % 100 mL IVPB        1 g 200 mL/hr over 30 Minutes Intravenous  Once 02/04/22 1757 02/04/22 2020   02/04/22 1800  azithromycin (ZITHROMAX) 500 mg in sodium chloride 0.9 % 250 mL IVPB        500 mg 250 mL/hr over 60 Minutes Intravenous  Once 02/04/22 1757 02/04/22 2146  Objective:  Vital Signs  Vitals:   02/11/22 1355 02/11/22 2114 02/12/22 0613 02/12/22 0946  BP: (!) 148/76 (!) 179/96 (!) 171/99 (!) 169/99  Pulse: 96 98 99 97  Resp: 20 18 19    Temp: 98 F (36.7 C) 98.5 F (36.9 C) 98.5 F (36.9 C)   TempSrc: Oral Oral Oral   SpO2: 94% 92% (!) 88%   Weight:      Height:        Intake/Output Summary (Last 24 hours) at 02/12/2022 1041 Last data filed at 02/12/2022 1012 Gross per 24 hour  Intake 420 ml  Output 425 ml  Net -5 ml    Filed Weights   02/04/22 2050 02/08/22 1702  Weight: 78.3 kg 75.8 kg    General appearance: Awake alert.  In no distress.  Distracted Resp: Improved air entry bilaterally with few crackles at the bases.  No wheezing or rhonchi. Cardio: S1-S2 is normal regular.  No S3-S4.  No rubs murmurs or bruit GI: Abdomen is soft.  Nontender nondistended.  Bowel sounds are present normal.  No masses organomegaly Extremities: No edema.  Full range of motion of lower extremities. Neurologic:   No focal neurological deficits.      Lab Results:  Data Reviewed: I have  personally reviewed following labs and reports of the imaging studies  CBC: Recent Labs  Lab 02/06/22 0355 02/07/22 0408 02/10/22 0455 02/11/22 0315 02/12/22 0315  WBC 6.4 5.2 1.4* 1.1* 1.0*  NEUTROABS  --   --  0.8* 0.5* 0.6*  HGB 13.3 12.7* 11.7* 11.2* 11.5*  HCT 43.7 39.8 35.0* 33.9* 33.6*  MCV 101.9* 97.5 93.3 93.6 91.1  PLT 187 228 107* 86* 55*     Basic Metabolic Panel: Recent Labs  Lab 02/06/22 0355 02/07/22 0408 02/08/22 0348 02/10/22 0455 02/11/22 0315  NA 144 145 143 144 140  K 4.2 4.0 3.5 3.3* 3.6  CL 115* 117* 116* 110 108  CO2 22 22 22 28 26   GLUCOSE 93 94 97 92 94  BUN 39* 37* 29* 24* 24*  CREATININE 1.87* 1.66* 1.25* 1.10 0.95  CALCIUM 8.2* 8.3* 8.1* 8.4* 8.2*  MG  --   --   --   --  1.8     GFR: Estimated Creatinine Clearance: 66.2 mL/min (by C-G formula based on SCr of 0.95 mg/dL).   CBG: Recent Labs  Lab 02/11/22 0736 02/11/22 1224 02/11/22 1621 02/11/22 2110 02/12/22 0731  GLUCAP 84 98 100* 112* 87       Recent Results (from the past 240 hour(s))  Susceptibility, Aer + Anaerob     Status: Abnormal   Collection Time: 02/04/22  1:34 PM  Result Value Ref Range Status   Suscept, Aer + Anaerob Preliminary report (A)  Final    Comment: (NOTE) Performed At: [facility] [place], [place] [id number] Rush Farmer MD YC:[id number]    Source of Sample [id number]  Final    Comment: Performed at [facility], [place] [address] [place], [place] [address]  Susceptibility Result     Status: Abnormal   Collection Time: 02/04/22  1:34 PM  Result Value Ref Range Status   Suscept Result 1 Acinetobacter lwoffii (A)  Final    Comment: (NOTE) Identification performed by account, not confirmed by this laboratory. Performed At: [facility] [place], [place] [id number] Rush Farmer MD JO:[id number]   Culture, blood (routine x 2)     Status: Abnormal (Preliminary result)  Collection Time:  02/04/22  5:30 PM   Specimen: BLOOD  Result Value Ref Range Status   Specimen Description   Final    BLOOD LEFT ANTECUBITAL Performed at [facility] [address], [place], [place] [address]    Special Requests   Final    BOTTLES DRAWN AEROBIC AND ANAEROBIC Blood Culture adequate volume Performed at [facility] [address], [place], [place] [address]    Culture  Setup Time   Final    GRAM NEGATIVE RODS IN BOTH AEROBIC AND ANAEROBIC BOTTLES CRITICAL RESULT CALLED TO, READ BACK BY AND VERIFIED WITH: PHARMD MARY ASHLYN ON 02/05/22 1723 BY DRT    Culture (A)  Final    ACINETOBACTER LWOFFII Sent to [facility] for further susceptibility testing. Performed at [facility], [place] [address] [place], [place] [address]    Report Status PENDING  Incomplete  Blood Culture ID Panel (Reflexed)     Status: None   Collection Time: 02/04/22  5:30 PM  Result Value Ref Range Status   Enterococcus faecalis NOT DETECTED NOT DETECTED Final   Enterococcus Faecium NOT DETECTED NOT DETECTED Final   Listeria monocytogenes NOT DETECTED NOT DETECTED Final   Staphylococcus species NOT DETECTED NOT DETECTED Final   Staphylococcus aureus (BCID) NOT DETECTED NOT DETECTED Final   Staphylococcus epidermidis NOT DETECTED NOT DETECTED Final   Staphylococcus lugdunensis NOT DETECTED NOT DETECTED Final   Streptococcus species NOT DETECTED NOT DETECTED Final   Streptococcus agalactiae NOT DETECTED NOT DETECTED Final   Streptococcus pneumoniae NOT DETECTED NOT DETECTED Final   Streptococcus pyogenes NOT DETECTED NOT DETECTED Final   A.calcoaceticus-baumannii NOT DETECTED NOT DETECTED Final   Bacteroides fragilis NOT DETECTED NOT DETECTED Final   Enterobacterales NOT DETECTED NOT DETECTED Final   Enterobacter cloacae complex NOT DETECTED NOT DETECTED Final   Escherichia coli NOT DETECTED NOT DETECTED Final   Klebsiella aerogenes NOT DETECTED NOT DETECTED Final    Klebsiella oxytoca NOT DETECTED NOT DETECTED Final   Klebsiella pneumoniae NOT DETECTED NOT DETECTED Final   Proteus species NOT DETECTED NOT DETECTED Final   Salmonella species NOT DETECTED NOT DETECTED Final   Serratia marcescens NOT DETECTED NOT DETECTED Final   Haemophilus influenzae NOT DETECTED NOT DETECTED Final   Neisseria meningitidis NOT DETECTED NOT DETECTED Final   Pseudomonas aeruginosa NOT DETECTED NOT DETECTED Final   Stenotrophomonas maltophilia NOT DETECTED NOT DETECTED Final   Candida albicans NOT DETECTED NOT DETECTED Final   Candida auris NOT DETECTED NOT DETECTED Final   Candida glabrata NOT DETECTED NOT DETECTED Final   Candida krusei NOT DETECTED NOT DETECTED Final   Candida parapsilosis NOT DETECTED NOT DETECTED Final   Candida tropicalis NOT DETECTED NOT DETECTED Final   Cryptococcus neoformans/gattii NOT DETECTED NOT DETECTED Final    Comment: Performed at [facility], [address] [place], [place] [address]  Culture, blood (routine x 2)     Status: None   Collection Time: 02/04/22  5:58 PM   Specimen: BLOOD  Result Value Ref Range Status   Specimen Description   Final    BLOOD BLOOD LEFT ARM Performed at [facility] [address] [place], [place] [address]    Special Requests   Final    BOTTLES DRAWN AEROBIC AND ANAEROBIC Blood Culture adequate volume Performed at [facility] [address] [place], [place] [address]    Culture   Final    NO GROWTH 5  DAYS Performed at [facility], [place] [address] [place], [place] [address]    Report Status 02/09/2022 FINAL  Final  Culture, body fluid w Gram Stain-bottle     Status: None   Collection Time: 02/05/22  1:46 PM   Specimen: Fluid  Result Value Ref Range Status   Specimen Description FLUID PLEURAL  Final   Special Requests BOTTLES DRAWN AEROBIC AND ANAEROBIC  Final   Culture   Final    NO GROWTH 5 DAYS Performed at [facility], [place] [address], [place], [place] [address]    Report Status 02/10/2022 FINAL  Final  Gram stain     Status: None   Collection Time: 02/05/22  1:46 PM   Specimen: BLOOD  Result Value Ref Range Status   Specimen Description BLOOD PLEURAL  Final   Special Requests NONE  Final   Gram Stain   Final    CYTOSPIN SMEAR NO ORGANISMS SEEN NO WBC SEEN Performed at [facility], [place] [address], [place], [place] [address]    Report Status 02/05/2022 FINAL  Final  Culture, blood (Routine X 2) w Reflex to ID Panel     Status: None   Collection Time: 02/06/22  1:41 PM   Specimen: BLOOD  Result Value Ref Range Status   Specimen Description   Final    BLOOD RIGHT ANTECUBITAL Performed at [facility] [address] [place], [place] [address]    Special Requests   Final    BOTTLES DRAWN AEROBIC AND ANAEROBIC Blood Culture adequate volume Performed at [facility] [address], [place], [place] [address]    Culture   Final    NO GROWTH 5 DAYS Performed at [facility], [place] [address] [place], [place] [address]    Report Status 02/11/2022 FINAL  Final  Culture, blood (Routine X 2) w Reflex to ID Panel     Status: None   Collection Time: 02/06/22  1:52 PM   Specimen: BLOOD  Result Value Ref Range Status   Specimen Description   Final    BLOOD BLOOD LEFT HAND Performed at [facility] [address], [place], [place] [address]    Special Requests   Final    BOTTLES DRAWN AEROBIC AND ANAEROBIC Blood Culture adequate volume Performed at [facility] [address] [place], [place] [address]    Culture   Final    NO GROWTH 5 DAYS Performed at [facility], [place] [address], [place],  [address]    Report Status 02/11/2022 FINAL  Final      Radiology Studies: No results found.     LOS: 8 days   Shaneya Taketa [employer] on [email or web address]  02/12/2022, 10:41 AM

## 2022-02-13 DIAGNOSIS — D61818 Other pancytopenia: Secondary | ICD-10-CM

## 2022-02-13 DIAGNOSIS — R7881 Bacteremia: Secondary | ICD-10-CM | POA: Diagnosis not present

## 2022-02-13 DIAGNOSIS — J189 Pneumonia, unspecified organism: Secondary | ICD-10-CM | POA: Diagnosis not present

## 2022-02-13 DIAGNOSIS — C3492 Malignant neoplasm of unspecified part of left bronchus or lung: Secondary | ICD-10-CM | POA: Diagnosis not present

## 2022-02-13 LAB — GLUCOSE, CAPILLARY
Glucose-Capillary: 90 mg/dL (ref 70–99)
Glucose-Capillary: 101 mg/dL — ABNORMAL HIGH (ref 70–99)
Glucose-Capillary: 94 mg/dL (ref 70–99)

## 2022-02-13 LAB — BASIC METABOLIC PANEL
Anion gap: 7 (ref 5–15)
BUN: 21 mg/dL (ref 8–23)
CO2: 26 mmol/L (ref 22–32)
Calcium: 8.4 mg/dL — ABNORMAL LOW (ref 8.9–10.3)
Chloride: 104 mmol/L (ref 98–111)
Creatinine, Ser: 0.84 mg/dL (ref 0.61–1.24)
GFR, Estimated: >60 mL/min (ref >60)
Glucose, Bld: 94 mg/dL (ref 70–99)
Potassium: 3 mmol/L — ABNORMAL LOW (ref 3.5–5.1)
Sodium: 137 mmol/L (ref 135–145)

## 2022-02-13 MED ORDER — METOPROLOL SUCCINATE ER 100 MG PO TB24
100.0000 mg | ORAL_TABLET | Freq: Every day | ORAL | Status: DC
  Administered 2022-02-14 – 2022-02-19 (×6): 100 mg via ORAL
  Filled 2022-02-13 (×6): qty 1

## 2022-02-13 MED ORDER — METOPROLOL SUCCINATE ER 50 MG PO TB24
50.0000 mg | ORAL_TABLET | Freq: Once | ORAL | Status: AC
  Administered 2022-02-13: 50 mg via ORAL
  Filled 2022-02-13: qty 1

## 2022-02-13 MED ORDER — TBO-FILGRASTIM 480 MCG/0.8ML ~~LOC~~ SOSY
480.0000 ug | PREFILLED_SYRINGE | Freq: Every day | SUBCUTANEOUS | Status: DC
  Administered 2022-02-13 – 2022-02-17 (×5): 480 ug via SUBCUTANEOUS
  Filled 2022-02-13 (×5): qty 0.8

## 2022-02-13 MED ORDER — POTASSIUM CHLORIDE CRYS ER 20 MEQ PO TBCR
40.0000 meq | EXTENDED_RELEASE_TABLET | ORAL | Status: AC
  Administered 2022-02-13 (×2): 40 meq via ORAL
  Filled 2022-02-13 (×2): qty 2

## 2022-02-13 NOTE — Progress Notes (Signed)
Patient's RN consult for bleeding on the incision site. She contacted MD from IR and gave instruction to press the site for 15 min then applied gauze dressing. Pressed on incision site for 15 min and noticed needle insertion area oozing the blood. Applied pressor dressing on incision site with Tegaderm and bleeding was stop on needle insertion area. Informed patient's RN Glenda Chroman regarding this matter and she is continue to monitor bleeding. HS [employer]

## 2022-02-13 NOTE — Progress Notes (Signed)
IV team consulted to assess bleeding from incision site in close proximity to right chest port. Truman Hayward, RN IV nurse address the issue, but bleeding restarted shortly thereafter.  IV team was again consulted and the on-call MD from  IR paged who gave instructions to apply pressure for 15 min to incision site the apply gauze. HS Truman Hayward RN from IV team addressed the problem by applying pressor dressing on incision site with Tegaderm and bleeding was stop on needle insertion area. Will continue with plan of care and continue to monitor bleeding. International aid/development worker  notified.

## 2022-02-13 NOTE — Progress Notes (Signed)
Patient's RN concerned there was bloody discharged on the gauze at the incision area. IV team noticed since 8/17 and reinforced gauze. Assessed incision area and found very small piece of surgical glue on Rt side edge of incision area and wound was lightly open. Not much oozing the blood at this time. Applied steri-strips on incision area for closing incision. No bleeding noticed after POA dressing change. HS [employer]

## 2022-02-13 NOTE — Plan of Care (Signed)
  Problem: Health Behavior/Discharge Planning: Goal: Ability to manage health-related needs will improve Outcome: Progressing   Problem: Clinical Measurements: Goal: Ability to maintain clinical measurements within normal limits will improve Outcome: Progressing   Problem: Nutrition: Goal: Adequate nutrition will be maintained Outcome: Not Progressing

## 2022-02-13 NOTE — Progress Notes (Signed)
[facility] PROGRESS NOTE   Drew Harrington TXM:[id number] DOB: [birth year] DOA: 02/04/2022  PCP: Hoyt Koch, MD  Brief History/Interval Summary: 76 y.o. male with medical history significant of stage IVB non-small cell lung cancer, adenocarcinoma with large apical LUL mass, with mediastinal, right cervical and bilateral adrenal metastases, PE involving large right main on Lovenox (dx 12/2021), HTN, dementia, CKD 3a who presents with diarrhea  and weakness.  Patient just received first session of chemotherapy on 8/9 and then started to have diarrhea.  He slipped in the bathtub while trying to wash out and subsequently was sent to ED.  In the ED, patient had a fever, was tachycardic and tachypneic.  With elevated creatinine.  CT head and cervical spine were negative for injuries.  He was also found to have large left pleural effusion.  His Lovenox was switched to IV heparin in anticipation for thoracentesis.  Patient underwent thoracentesis 8/11 which removed 1.1 L fluid. Hospitalization further complicated by blood culture +Acinectobacter.    Consultants: Phone discussion with ID.  Medical oncology  Procedures: Thoracentesis    Subjective/Interval History: Patient remains quite upset that he is unable to go home yet.  Denies any complaints.  Did have episode of cough yesterday which appears to have improved after he was given cough medications.     Assessment/Plan:  Community-acquired pneumonia/severe sepsis present on admission/chronic respiratory failure with hypoxia Sepsis was present on admission with fever, tachycardia, tachypnea with endorgan damage including acute kidney injury and respiratory failure. Patient treated with antibiotics.  Respiratory status has improved.  He is on oxygen on and off.  Acinetobacter bacteremia Found in 1 aerobic bottle on 8/10.  Repeat cultures from 8/12 negative. Initially on cefepime and azithromycin.  Changed over initially to  meropenem and then changed back to cefepime.  Pleated 7 days of cefepime on 8/18. Susceptibility data is still pending.  Had to be sent out to [facility].  Microbiology was called and they said that it may take up to a week for these results to be available.  Since patient is afebrile 1 week of treatment should suffice although this plan is somewhat complicated by development of neutropenia from his chemotherapy. We will continue to monitor off of antibiotics for now.  Large left pleural effusion Underwent thoracentesis on 8/11 with removal of 1.1 L of fluid.  Was noted to be exudative. No malignant cells noted on cytology.  Cultures without any growth. Respiratory status is stable.  Pancytopenia including normocytic anemia, leukopenia and thrombocytopenia Most likely due to chemotherapy that he received on 8/9.  Neutrophil counts continue to drop.  He was started on Granix on 8/17.  Discussed with Dr. Irene Limbo with medical oncology.  He expects counts to continue to drop for at least another 24 to 48 hours.  He recommends continuing Granix for now.  Recommends that patient stay in the hospital to count start improving.   Platelet counts noted to be 23,000.  Will transfuse if it drops below 10,000 or if he is experiences bleeding.    Acute kidney injury on chronic kidney disease stage IIIa/hypokalemia Resolved with IV fluids.  Baseline creatinine is 1.4. Potassium to be supplemented again today.  Magnesium was 1.8.  We will recheck tomorrow.  Acute metabolic encephalopathy Multifactorial.  Appears to have resolved.  History of PE Noted to be on Lovenox prior to admission which is being continued.  Stage IV adenocarcinoma of left lung Followed by Dr. Julien Nordmann.  On systemic chemotherapy.  Port-A-Cath  placed by interventional radiology.  Palliative care was consulted. Last chemotherapy on 8/9.  Does not appear that he received any marrow stimulating agents during that time.  Diabetes mellitus type 2,  controlled HbA1c 6.3.  Continue SSI.  CBGs are reasonably well controlled.  Essential hypertension Started back on his blood pressure medications.  He is currently noted to be on losartan and metoprolol.  Dose of metoprolol was increased yesterday.  Blood pressure remains poorly controlled.  Will increase the dose of metoprolol some more today.  Heart rate is in the 90s.  DVT Prophylaxis: On full dose Lovenox Code Status: DNR Family Communication: Wife will be updated today. Disposition Plan: Anticipate discharge home when his counts start improving.  Status is: Inpatient Remains inpatient appropriate because: Pancytopenia    Medications: Scheduled:  Chlorhexidine Gluconate Cloth  6 each Topical Daily   enoxaparin (LOVENOX) injection  70 mg Subcutaneous Q12H   feeding supplement  237 mL Oral BID BM   guaiFENesin  600 mg Oral BID   insulin aspart  0-9 Units Subcutaneous TID WC   losartan  100 mg Oral Daily   metoprolol succinate  50 mg Oral Daily   potassium chloride  40 mEq Oral Q4H   sodium chloride flush  10-40 mL Intracatheter Q12H   Tbo-filgastrim (GRANIX) SQ  480 mcg Subcutaneous q1800   Continuous:   ZOX:[id number], guaiFENesin, mouth rinse, sodium chloride flush  Antibiotics: Anti-infectives (From admission, onward)    Start     Dose/Rate Route Frequency Ordered Stop   02/11/22 0800  ceFEPIme (MAXIPIME) 2 g in sodium chloride 0.9 % 100 mL IVPB        2 g 200 mL/hr over 30 Minutes Intravenous Every 8 hours 02/11/22 0727 02/12/22 2223   02/08/22 2200  ceFEPIme (MAXIPIME) 2 g in sodium chloride 0.9 % 100 mL IVPB  Status:  Discontinued        2 g 200 mL/hr over 30 Minutes Intravenous Every 12 hours 02/08/22 1317 02/11/22 0727   02/08/22 0930  meropenem (MERREM) 1 g in sodium chloride 0.9 % 100 mL IVPB  Status:  Discontinued        1 g 200 mL/hr over 30 Minutes Intravenous Every 8 hours 02/08/22 0830 02/08/22 1235   02/07/22 1430  meropenem (MERREM) 1 g in sodium  chloride 0.9 % 100 mL IVPB  Status:  Discontinued        1 g 200 mL/hr over 30 Minutes Intravenous Every 12 hours 02/07/22 1337 02/08/22 0830   02/05/22 2200  ceFEPIme (MAXIPIME) 2 g in sodium chloride 0.9 % 100 mL IVPB  Status:  Discontinued        2 g 200 mL/hr over 30 Minutes Intravenous Every 12 hours 02/05/22 1738 02/07/22 1337   02/05/22 1800  cefTRIAXone (ROCEPHIN) 2 g in sodium chloride 0.9 % 100 mL IVPB  Status:  Discontinued        2 g 200 mL/hr over 30 Minutes Intravenous Every 24 hours 02/04/22 2026 02/05/22 1738   02/05/22 1800  azithromycin (ZITHROMAX) 500 mg in sodium chloride 0.9 % 250 mL IVPB  Status:  Discontinued        500 mg 250 mL/hr over 60 Minutes Intravenous Every 24 hours 02/04/22 2026 02/07/22 1338   02/04/22 1800  cefTRIAXone (ROCEPHIN) 1 g in sodium chloride 0.9 % 100 mL IVPB        1 g 200 mL/hr over 30 Minutes Intravenous  Once 02/04/22 1757 02/04/22 2020   02/04/22  1800  azithromycin (ZITHROMAX) 500 mg in sodium chloride 0.9 % 250 mL IVPB        500 mg 250 mL/hr over 60 Minutes Intravenous  Once 02/04/22 1757 02/04/22 2146       Objective:  Vital Signs  Vitals:   02/12/22 1509 02/12/22 2211 02/13/22 0453 02/13/22 0848  BP: (!) 144/94 (!) 183/94 (!) 163/93 (!) 170/97  Pulse: 99 96 89 92  Resp: 18 20 19 18   Temp: 97.7 F (36.5 C) 98.8 F (37.1 C) 99.4 F (37.4 C) 98.7 F (37.1 C)  TempSrc: Oral Oral Oral Oral  SpO2: 97% 93% 92% 94%  Weight:      Height:        Intake/Output Summary (Last 24 hours) at 02/13/2022 0948 Last data filed at 02/13/2022 0453 Gross per 24 hour  Intake 180 ml  Output 1100 ml  Net -920 ml    Filed Weights   02/04/22 2050 02/08/22 1702  Weight: 78.3 kg 75.8 kg    General appearance: Awake alert.  In no distress.  Remains distracted Resp: Coarse breath sounds bilaterally with few crackles at the bases.  No wheezing or rhonchi. Cardio: S1-S2 is normal regular.  No S3-S4.  No rubs murmurs or bruit GI: Abdomen is  soft.  Nontender nondistended.  Bowel sounds are present normal.  No masses organomegaly Extremities: No edema.  Neurologic:  No focal neurological deficits.     Lab Results:  Data Reviewed: I have personally reviewed following labs and reports of the imaging studies  CBC: Recent Labs  Lab 02/07/22 0408 02/10/22 0455 02/11/22 0315 02/12/22 0315 02/13/22 0532  WBC 5.2 1.4* 1.1* 1.0* 0.6*  NEUTROABS  --  0.8* 0.5* 0.6* 0.2*  HGB 12.7* 11.7* 11.2* 11.5* 11.0*  HCT 39.8 35.0* 33.9* 33.6* 31.6*  MCV 97.5 93.3 93.6 91.1 90.0  PLT 228 107* 86* 55* 23*     Basic Metabolic Panel: Recent Labs  Lab 02/07/22 0408 02/08/22 0348 02/10/22 0455 02/11/22 0315 02/13/22 0532  NA 145 143 144 140 137  K 4.0 3.5 3.3* 3.6 3.0*  CL 117* 116* 110 108 104  CO2 22 22 28 26 26   GLUCOSE 94 97 92 94 94  BUN 37* 29* 24* 24* 21  CREATININE 1.66* 1.25* 1.10 0.95 0.84  CALCIUM 8.3* 8.1* 8.4* 8.2* 8.4*  MG  --   --   --  1.8  --      GFR: Estimated Creatinine Clearance: 74.8 mL/min (by C-G formula based on SCr of 0.84 mg/dL).   CBG: Recent Labs  Lab 02/12/22 0731 02/12/22 1130 02/12/22 1630 02/12/22 2215 02/13/22 0749  GLUCAP 87 139* 139* 92 90       Recent Results (from the past 240 hour(s))  Susceptibility, Aer + Anaerob     Status: Abnormal   Collection Time: 02/04/22  1:34 PM  Result Value Ref Range Status   Suscept, Aer + Anaerob Preliminary report (A)  Final    Comment: (NOTE) Performed At: [facility] [place], [place] [id number] Rush Farmer MD WY:[id number]    Source of Sample [phone]  Final    Comment: Performed at [facility], [place] [address] [place], [place] [address]  Susceptibility Result     Status: Abnormal   Collection Time: 02/04/22  1:34 PM  Result Value Ref Range Status   Suscept Result 1 Acinetobacter lwoffii (A)  Final    Comment: (NOTE) Identification performed by account, not confirmed by this  laboratory. Performed  At: [facility] [place], [place] [id number] Rush Farmer MD OZ:[id number]   Culture, blood (routine x 2)     Status: Abnormal (Preliminary result)   Collection Time: 02/04/22  5:30 PM   Specimen: BLOOD  Result Value Ref Range Status   Specimen Description   Final    BLOOD LEFT ANTECUBITAL Performed at [place] [address], [place], [place] [address]    Special Requests   Final    BOTTLES DRAWN AEROBIC AND ANAEROBIC Blood Culture adequate volume Performed at [place] [address] [place], [place] [address]    Culture  Setup Time   Final    GRAM NEGATIVE RODS IN BOTH AEROBIC AND ANAEROBIC BOTTLES CRITICAL RESULT CALLED TO, READ BACK BY AND VERIFIED WITH: PHARMD MARY ASHLYN ON 02/05/22 1723 BY DRT    Culture (A)  Final    ACINETOBACTER LWOFFII Sent to [place] for further susceptibility testing. Performed at [facility], [place] [address], [place], [place] [address]    Report Status PENDING  Incomplete  Blood Culture ID Panel (Reflexed)     Status: None   Collection Time: 02/04/22  5:30 PM  Result Value Ref Range Status   Enterococcus faecalis NOT DETECTED NOT DETECTED Final   Enterococcus Faecium NOT DETECTED NOT DETECTED Final   Listeria monocytogenes NOT DETECTED NOT DETECTED Final   Staphylococcus species NOT DETECTED NOT DETECTED Final   Staphylococcus aureus (BCID) NOT DETECTED NOT DETECTED Final   Staphylococcus epidermidis NOT DETECTED NOT DETECTED Final   Staphylococcus lugdunensis NOT DETECTED NOT DETECTED Final   Streptococcus species NOT DETECTED NOT DETECTED Final   Streptococcus agalactiae NOT DETECTED NOT DETECTED Final   Streptococcus pneumoniae NOT DETECTED NOT DETECTED Final   Streptococcus pyogenes NOT DETECTED NOT DETECTED Final   A.calcoaceticus-baumannii NOT DETECTED NOT DETECTED Final   Bacteroides fragilis NOT DETECTED NOT DETECTED Final   Enterobacterales NOT DETECTED NOT  DETECTED Final   Enterobacter cloacae complex NOT DETECTED NOT DETECTED Final   Escherichia coli NOT DETECTED NOT DETECTED Final   Klebsiella aerogenes NOT DETECTED NOT DETECTED Final   Klebsiella oxytoca NOT DETECTED NOT DETECTED Final   Klebsiella pneumoniae NOT DETECTED NOT DETECTED Final   Proteus species NOT DETECTED NOT DETECTED Final   Salmonella species NOT DETECTED NOT DETECTED Final   Serratia marcescens NOT DETECTED NOT DETECTED Final   Haemophilus influenzae NOT DETECTED NOT DETECTED Final   Neisseria meningitidis NOT DETECTED NOT DETECTED Final   Pseudomonas aeruginosa NOT DETECTED NOT DETECTED Final   Stenotrophomonas maltophilia NOT DETECTED NOT DETECTED Final   Candida albicans NOT DETECTED NOT DETECTED Final   Candida auris NOT DETECTED NOT DETECTED Final   Candida glabrata NOT DETECTED NOT DETECTED Final   Candida krusei NOT DETECTED NOT DETECTED Final   Candida parapsilosis NOT DETECTED NOT DETECTED Final   Candida tropicalis NOT DETECTED NOT DETECTED Final   Cryptococcus neoformans/gattii NOT DETECTED NOT DETECTED Final    Comment: Performed at [facility], [address], [place], [place] [address]  Culture, blood (routine x 2)     Status: None   Collection Time: 02/04/22  5:58 PM   Specimen: BLOOD  Result Value Ref Range Status   Specimen Description   Final    BLOOD BLOOD LEFT ARM Performed at [place] [address] [place], [place] [address]    Special Requests   Final    BOTTLES DRAWN  AEROBIC AND ANAEROBIC Blood Culture adequate volume Performed at [place] [address] [place], [place] [address]    Culture   Final    NO GROWTH 5 DAYS Performed at [facility], [place] [address] [place], [place] [address]    Report Status 02/09/2022 FINAL  Final  Culture, body fluid w Gram Stain-bottle     Status: None   Collection Time: 02/05/22  1:46 PM   Specimen: Fluid  Result Value Ref Range Status    Specimen Description FLUID PLEURAL  Final   Special Requests BOTTLES DRAWN AEROBIC AND ANAEROBIC  Final   Culture   Final    NO GROWTH 5 DAYS Performed at [facility], [place] [address] [place], [place] [address]    Report Status 02/10/2022 FINAL  Final  Gram stain     Status: None   Collection Time: 02/05/22  1:46 PM   Specimen: BLOOD  Result Value Ref Range Status   Specimen Description BLOOD PLEURAL  Final   Special Requests NONE  Final   Gram Stain   Final    CYTOSPIN SMEAR NO ORGANISMS SEEN NO WBC SEEN Performed at [facility], [place] [address] [place], [place] [address]    Report Status 02/05/2022 FINAL  Final  Culture, blood (Routine X 2) w Reflex to ID Panel     Status: None   Collection Time: 02/06/22  1:41 PM   Specimen: BLOOD  Result Value Ref Range Status   Specimen Description   Final    BLOOD RIGHT ANTECUBITAL Performed at [place] [address] [place], [place] [address]    Special Requests   Final    BOTTLES DRAWN AEROBIC AND ANAEROBIC Blood Culture adequate volume Performed at [place] [address] [place], [place] [address]    Culture   Final    NO GROWTH 5 DAYS Performed at [facility], [place] [address], [place], [place] [address]    Report Status 02/11/2022 FINAL  Final  Culture, blood (Routine X 2) w Reflex to ID Panel     Status: None   Collection Time: 02/06/22  1:52 PM   Specimen: BLOOD  Result Value Ref Range Status   Specimen Description   Final    BLOOD BLOOD LEFT HAND Performed at [place] [address] [place], [place] [address]    Special Requests   Final    BOTTLES DRAWN AEROBIC AND ANAEROBIC Blood Culture adequate volume Performed at [place] [address], [place], [place] [address]    Culture   Final    NO GROWTH 5 DAYS Performed at [facility], [place] [address], [place], [place] [address]    Report  Status 02/11/2022 FINAL  Final      Radiology Studies: No results found.     LOS: 9 days   Calandra Madura [employer] on [email or web address]  02/13/2022, 9:48 AM

## 2022-02-14 DIAGNOSIS — R7881 Bacteremia: Secondary | ICD-10-CM | POA: Diagnosis not present

## 2022-02-14 DIAGNOSIS — D61818 Other pancytopenia: Secondary | ICD-10-CM | POA: Diagnosis not present

## 2022-02-14 DIAGNOSIS — J189 Pneumonia, unspecified organism: Secondary | ICD-10-CM | POA: Diagnosis not present

## 2022-02-14 DIAGNOSIS — C3492 Malignant neoplasm of unspecified part of left bronchus or lung: Secondary | ICD-10-CM | POA: Diagnosis not present

## 2022-02-14 LAB — CBC WITH DIFFERENTIAL/PLATELET
Abs Immature Granulocytes: 0 10*3/uL (ref 0.00–0.07)
Abs Immature Granulocytes: 0 10*3/uL (ref 0.00–0.07)
Abs Immature Granulocytes: 0 10*3/uL (ref 0.00–0.07)
Basophils Absolute: 0 10*3/uL (ref 0.0–0.1)
Basophils Absolute: 0 10*3/uL (ref 0.0–0.1)
Basophils Absolute: 0 10*3/uL (ref 0.0–0.1)
Basophils Relative: 3 %
Basophils Relative: 2 %
Basophils Relative: 2 %
Eosinophils Absolute: 0.1 10*3/uL (ref 0.0–0.5)
Eosinophils Absolute: 0.1 10*3/uL (ref 0.0–0.5)
Eosinophils Absolute: 0.1 10*3/uL (ref 0.0–0.5)
Eosinophils Relative: 11 %
Eosinophils Relative: 15 %
Eosinophils Relative: 11 %
HCT: 31.6 % — ABNORMAL LOW (ref 39.0–52.0)
HCT: 32.1 % — ABNORMAL LOW (ref 39.0–52.0)
HCT: 30.8 % — ABNORMAL LOW (ref 39.0–52.0)
Hemoglobin: 11 g/dL — ABNORMAL LOW (ref 13.0–17.0)
Hemoglobin: 10.9 g/dL — ABNORMAL LOW (ref 13.0–17.0)
Hemoglobin: 10.5 g/dL — ABNORMAL LOW (ref 13.0–17.0)
Immature Granulocytes: 0 %
Immature Granulocytes: 0 %
Immature Granulocytes: 0 %
Lymphocytes Relative: 48 %
Lymphocytes Relative: 64 %
Lymphocytes Relative: 69 %
Lymphs Abs: 0.3 10*3/uL — ABNORMAL LOW (ref 0.7–4.0)
Lymphs Abs: 0.3 10*3/uL — ABNORMAL LOW (ref 0.7–4.0)
Lymphs Abs: 0.4 10*3/uL — ABNORMAL LOW (ref 0.7–4.0)
MCH: 31.3 pg (ref 26.0–34.0)
MCH: 30.8 pg (ref 26.0–34.0)
MCH: 30.7 pg (ref 26.0–34.0)
MCHC: 34.8 g/dL (ref 30.0–36.0)
MCHC: 34 g/dL (ref 30.0–36.0)
MCHC: 34.1 g/dL (ref 30.0–36.0)
MCV: 90 fL (ref 80.0–100.0)
MCV: 90.7 fL (ref 80.0–100.0)
MCV: 90.1 fL (ref 80.0–100.0)
Monocytes Absolute: 0.1 10*3/uL (ref 0.1–1.0)
Monocytes Absolute: 0 10*3/uL — ABNORMAL LOW (ref 0.1–1.0)
Monocytes Absolute: 0 10*3/uL — ABNORMAL LOW (ref 0.1–1.0)
Monocytes Relative: 8 %
Monocytes Relative: 4 %
Monocytes Relative: 7 %
Neutro Abs: 0.2 10*3/uL — CL (ref 1.7–7.7)
Neutro Abs: 0.1 10*3/uL — CL (ref 1.7–7.7)
Neutro Abs: 0.1 10*3/uL — CL (ref 1.7–7.7)
Neutrophils Relative %: 30 %
Neutrophils Relative %: 15 %
Neutrophils Relative %: 11 %
Platelets: 23 10*3/uL — CL (ref 150–400)
Platelets: 9 10*3/uL — CL (ref 150–400)
Platelets: 14 10*3/uL — CL (ref 150–400)
RBC: 3.51 MIL/uL — ABNORMAL LOW (ref 4.22–5.81)
RBC: 3.54 MIL/uL — ABNORMAL LOW (ref 4.22–5.81)
RBC: 3.42 MIL/uL — ABNORMAL LOW (ref 4.22–5.81)
RDW: 14.8 % (ref 11.5–15.5)
RDW: 14.4 % (ref 11.5–15.5)
RDW: 14.6 % (ref 11.5–15.5)
Smear Review: DECREASED
WBC: 0.6 10*3/uL — CL (ref 4.0–10.5)
WBC: 0.5 10*3/uL — CL (ref 4.0–10.5)
WBC: 0.6 10*3/uL — CL (ref 4.0–10.5)
nRBC: 0 % (ref 0.0–0.2)
nRBC: 0 % (ref 0.0–0.2)
nRBC: 0 % (ref 0.0–0.2)

## 2022-02-14 LAB — CULTURE, BLOOD (ROUTINE X 2): Special Requests: ADEQUATE

## 2022-02-14 LAB — SUSCEPTIBILITY RESULT

## 2022-02-14 LAB — BASIC METABOLIC PANEL
Anion gap: 5 (ref 5–15)
BUN: 20 mg/dL (ref 8–23)
CO2: 26 mmol/L (ref 22–32)
Calcium: 8.5 mg/dL — ABNORMAL LOW (ref 8.9–10.3)
Chloride: 107 mmol/L (ref 98–111)
Creatinine, Ser: 0.71 mg/dL (ref 0.61–1.24)
GFR, Estimated: >60 mL/min (ref >60)
Glucose, Bld: 90 mg/dL (ref 70–99)
Potassium: 3.7 mmol/L (ref 3.5–5.1)
Sodium: 138 mmol/L (ref 135–145)

## 2022-02-14 LAB — GLUCOSE, CAPILLARY
Glucose-Capillary: 92 mg/dL (ref 70–99)
Glucose-Capillary: 89 mg/dL (ref 70–99)
Glucose-Capillary: 93 mg/dL (ref 70–99)
Glucose-Capillary: 91 mg/dL (ref 70–99)

## 2022-02-14 LAB — SUSCEPTIBILITY, AER + ANAEROB: Source of Sample: 8680

## 2022-02-14 LAB — TYPE AND SCREEN
ABO/RH(D): AB POS
Antibody Screen: NEGATIVE

## 2022-02-14 LAB — ABO/RH: ABO/RH(D): AB POS

## 2022-02-14 LAB — MAGNESIUM: Magnesium: 1.6 mg/dL — ABNORMAL LOW (ref 1.7–2.4)

## 2022-02-14 MED ORDER — ENOXAPARIN SODIUM 80 MG/0.8ML IJ SOSY
70.0000 mg | PREFILLED_SYRINGE | Freq: Two times a day (BID) | INTRAMUSCULAR | Status: DC
Start: 2022-02-15 — End: 2022-02-15
  Filled 2022-02-14: qty 0.8

## 2022-02-14 MED ORDER — POTASSIUM CHLORIDE CRYS ER 20 MEQ PO TBCR
40.0000 meq | EXTENDED_RELEASE_TABLET | Freq: Once | ORAL | Status: AC
  Administered 2022-02-14: 40 meq via ORAL
  Filled 2022-02-14: qty 2

## 2022-02-14 MED ORDER — AMLODIPINE BESYLATE 10 MG PO TABS
5.0000 mg | ORAL_TABLET | Freq: Every day | ORAL | Status: DC
  Administered 2022-02-14 – 2022-02-16 (×3): 5 mg via ORAL
  Filled 2022-02-14 (×3): qty 1

## 2022-02-14 MED ORDER — SODIUM CHLORIDE 0.9% IV SOLUTION: Freq: Once | INTRAVENOUS | Status: AC

## 2022-02-14 NOTE — Progress Notes (Signed)
[facility] PROGRESS NOTE   Drew Harrington WUJ:[id number] DOB: [birth year] DOA: 02/04/2022  PCP: Hoyt Koch, MD  Brief History/Interval Summary: 76 y.o. male with medical history significant of stage IVB non-small cell lung cancer, adenocarcinoma with large apical LUL mass, with mediastinal, right cervical and bilateral adrenal metastases, PE involving large right main on Lovenox (dx 12/2021), HTN, dementia, CKD 3a who presents with diarrhea  and weakness.  Patient just received first session of chemotherapy on 8/9 and then started to have diarrhea.  He slipped in the bathtub while trying to wash out and subsequently was sent to ED.  In the ED, patient had a fever, was tachycardic and tachypneic.  With elevated creatinine.  CT head and cervical spine were negative for injuries.  He was also found to have large left pleural effusion.  His Lovenox was switched to IV heparin in anticipation for thoracentesis.  Patient underwent thoracentesis 8/11 which removed 1.1 L fluid. Hospitalization further complicated by blood culture +Acinectobacter.    Consultants: Phone discussion with ID.  Medical oncology  Procedures: Thoracentesis    Subjective/Interval History: Patient noted to have a lot of bleeding from the Port-A-Cath site.  Patient denies any complaints this morning.  Occasional cough is present.  Denies any chest pain.  No nausea vomiting.     Assessment/Plan:  Community-acquired pneumonia/severe sepsis present on admission/chronic respiratory failure with hypoxia Sepsis was present on admission with fever, tachycardia, tachypnea with endorgan damage including acute kidney injury and respiratory failure. Patient treated with antibiotics.  Respiratory status has improved.  Seems to be doing well on room air for the most part.  Requiring oxygen at times.  Acinetobacter bacteremia Found in 1 aerobic bottle on 8/10.  Repeat cultures from 8/12 negative. Initially on cefepime  and azithromycin.  Changed over initially to meropenem and then changed back to cefepime.  Patient completed 7 days of cefepime on 8/18. Susceptibility data is still pending.  Had to be sent out to [place].  Microbiology was called and they said that it may take up to a week for these results to be available.  Since patient is afebrile 1 week of treatment should suffice although this plan is somewhat complicated by development of neutropenia from his chemotherapy. We will continue to monitor off of antibiotics for now.  Remains afebrile.  Large left pleural effusion Underwent thoracentesis on 8/11 with removal of 1.1 L of fluid.  Was noted to be exudative. No malignant cells noted on cytology.  Cultures without any growth. Respiratory status is stable.  Pancytopenia including normocytic anemia, leukopenia and thrombocytopenia Most likely due to chemotherapy that he received on 8/9.   He was started on Granix on 8/17.   Discussed with Dr. Irene Limbo with medical oncology.  He expects counts to continue to drop for at least another 24 to 48 hours.  He recommends continuing Granix for now.  Recommends that patient stay in the hospital until counts start improving.   Hemoglobin remains stable. Absolute neutrophil count is 100. Platelet count noted to be 9000 today.  Patient experiencing some bleeding around the Port-A-Cath site.  We will transfuse 1 unit of platelets today.  Hold his Lovenox for today.  Acute kidney injury on chronic kidney disease stage IIIa/hypokalemia/hypomagnesemia Renal function noted to be normal.  Potassium is improved today.  Magnesium noted to be 1.6 and supplemented.    Acute metabolic encephalopathy Multifactorial.  Appears to have resolved.  History of PE Has been on Lovenox even in  the outpatient setting.  Due to significant drop in platelet counts requiring transfusion and some bleeding around the Port-A-Cath site we will hold Lovenox for 24 hours.  Stage IV  adenocarcinoma of left lung Followed by Dr. Julien Nordmann.  On systemic chemotherapy.  Port-A-Cath placed by interventional radiology.  Palliative care was consulted. Last chemotherapy on 8/9.  Does not appear that he received any marrow stimulating agents during that time.  Diabetes mellitus type 2, controlled HbA1c 6.3.  Continue SSI.  CBGs are reasonably well controlled.  Essential hypertension Started back on his blood pressure medications.  He is currently noted to be on losartan and metoprolol.  D dose of metoprolol has been increased.  Blood pressure remains poorly controlled.  May need to add additional medications.    DVT Prophylaxis: On full dose Lovenox should be held for 24 hours Code Status: DNR Family Communication: Wife being updated on every other day basis Disposition Plan: Anticipate discharge home when his counts start improving.  Status is: Inpatient Remains inpatient appropriate because: Pancytopenia    Medications: Scheduled:  sodium chloride   Intravenous Once   Chlorhexidine Gluconate Cloth  6 each Topical Daily   [START ON 02/15/2022] enoxaparin (LOVENOX) injection  70 mg Subcutaneous Q12H   feeding supplement  237 mL Oral BID BM   guaiFENesin  600 mg Oral BID   insulin aspart  0-9 Units Subcutaneous TID WC   losartan  100 mg Oral Daily   metoprolol succinate  100 mg Oral Daily   sodium chloride flush  10-40 mL Intracatheter Q12H   Tbo-filgastrim (GRANIX) SQ  480 mcg Subcutaneous q1800   Continuous:   GTX:MIWOEHOZYYQMG, guaiFENesin, mouth rinse, sodium chloride flush  Antibiotics: Anti-infectives (From admission, onward)    Start     Dose/Rate Route Frequency Ordered Stop   02/11/22 0800  ceFEPIme (MAXIPIME) 2 g in sodium chloride 0.9 % 100 mL IVPB        2 g 200 mL/hr over 30 Minutes Intravenous Every 8 hours 02/11/22 0727 02/12/22 2223   02/08/22 2200  ceFEPIme (MAXIPIME) 2 g in sodium chloride 0.9 % 100 mL IVPB  Status:  Discontinued        2 g 200  mL/hr over 30 Minutes Intravenous Every 12 hours 02/08/22 1317 02/11/22 0727   02/08/22 0930  meropenem (MERREM) 1 g in sodium chloride 0.9 % 100 mL IVPB  Status:  Discontinued        1 g 200 mL/hr over 30 Minutes Intravenous Every 8 hours 02/08/22 0830 02/08/22 1235   02/07/22 1430  meropenem (MERREM) 1 g in sodium chloride 0.9 % 100 mL IVPB  Status:  Discontinued        1 g 200 mL/hr over 30 Minutes Intravenous Every 12 hours 02/07/22 1337 02/08/22 0830   02/05/22 2200  ceFEPIme (MAXIPIME) 2 g in sodium chloride 0.9 % 100 mL IVPB  Status:  Discontinued        2 g 200 mL/hr over 30 Minutes Intravenous Every 12 hours 02/05/22 1738 02/07/22 1337   02/05/22 1800  cefTRIAXone (ROCEPHIN) 2 g in sodium chloride 0.9 % 100 mL IVPB  Status:  Discontinued        2 g 200 mL/hr over 30 Minutes Intravenous Every 24 hours 02/04/22 2026 02/05/22 1738   02/05/22 1800  azithromycin (ZITHROMAX) 500 mg in sodium chloride 0.9 % 250 mL IVPB  Status:  Discontinued        500 mg 250 mL/hr over 60 Minutes Intravenous  Every 24 hours 02/04/22 2026 02/07/22 1338   02/04/22 1800  cefTRIAXone (ROCEPHIN) 1 g in sodium chloride 0.9 % 100 mL IVPB        1 g 200 mL/hr over 30 Minutes Intravenous  Once 02/04/22 1757 02/04/22 2020   02/04/22 1800  azithromycin (ZITHROMAX) 500 mg in sodium chloride 0.9 % 250 mL IVPB        500 mg 250 mL/hr over 60 Minutes Intravenous  Once 02/04/22 1757 02/04/22 2146       Objective:  Vital Signs  Vitals:   02/13/22 2115 02/13/22 2146 02/14/22 0544 02/14/22 0837  BP:  (!) 167/96 (!) 174/87 (!) 163/97  Pulse:   86   Resp: 20     Temp:  98.5 F (36.9 C) 99.1 F (37.3 C)   TempSrc:  Oral Oral   SpO2:      Weight:      Height:        Intake/Output Summary (Last 24 hours) at 02/14/2022 1015 Last data filed at 02/14/2022 0500 Gross per 24 hour  Intake 60 ml  Output 850 ml  Net -790 ml    Filed Weights   02/04/22 2050 02/08/22 1702  Weight: 78.3 kg 75.8 kg    General  appearance: Awake alert.  In no distress Resp: Coarse breath sounds bilaterally.  Few crackles at the bases.  No wheezing or rhonchi. Cardio: S1-S2 is normal regular.  No S3-S4.  No rubs murmurs or bruit GI: Abdomen is soft.  Nontender nondistended.  Bowel sounds are present normal.  No masses organomegaly Extremities: No edema.  Neurologic:  No focal neurological deficits.     Lab Results:  Data Reviewed: I have personally reviewed following labs and reports of the imaging studies  CBC: Recent Labs  Lab 02/10/22 0455 02/11/22 0315 02/12/22 0315 02/13/22 0532 02/14/22 0516  WBC 1.4* 1.1* 1.0* 0.6* 0.5*  NEUTROABS 0.8* 0.5* 0.6* 0.2* 0.1*  HGB 11.7* 11.2* 11.5* 11.0* 10.9*  HCT 35.0* 33.9* 33.6* 31.6* 32.1*  MCV 93.3 93.6 91.1 90.0 90.7  PLT 107* 86* 55* 23* 9*     Basic Metabolic Panel: Recent Labs  Lab 02/08/22 0348 02/10/22 0455 02/11/22 0315 02/13/22 0532 02/14/22 0516  NA 143 144 140 137 138  K 3.5 3.3* 3.6 3.0* 3.7  CL 116* 110 108 104 107  CO2 22 28 26 26 26   GLUCOSE 97 92 94 94 90  BUN 29* 24* 24* 21 20  CREATININE 1.25* 1.10 0.95 0.84 0.71  CALCIUM 8.1* 8.4* 8.2* 8.4* 8.5*  MG  --   --  1.8  --  1.6*     GFR: Estimated Creatinine Clearance: 78.6 mL/min (by C-G formula based on SCr of 0.71 mg/dL).   CBG: Recent Labs  Lab 02/12/22 2215 02/13/22 0749 02/13/22 1223 02/13/22 1716 02/14/22 0742  GLUCAP 92 90 101* 94 92       Recent Results (from the past 240 hour(s))  Susceptibility, Aer + Anaerob     Status: Abnormal   Collection Time: 02/04/22  1:34 PM  Result Value Ref Range Status   Suscept, Aer + Anaerob Preliminary report (A)  Final    Comment: (NOTE) Performed At: [facility] [place], [place] [id number] Rush Farmer MD BZ:[id number]    Source of Sample [phone]  Final    Comment: Performed at [facility], [place] [address], [place], [place] [address]  Susceptibility Result     Status: Abnormal  Collection Time: 02/04/22  1:34 PM  Result Value Ref Range Status   Suscept Result 1 Acinetobacter lwoffii (A)  Final    Comment: (NOTE) Identification performed by account, not confirmed by this laboratory. Performed At: [facility] [place], [place] [id number] Rush Farmer MD UD:[id number]   Culture, blood (routine x 2)     Status: Abnormal (Preliminary result)   Collection Time: 02/04/22  5:30 PM   Specimen: BLOOD  Result Value Ref Range Status   Specimen Description   Final    BLOOD LEFT ANTECUBITAL Performed at [facility] [address] [place], [place] [address]    Special Requests   Final    BOTTLES DRAWN AEROBIC AND ANAEROBIC Blood Culture adequate volume Performed at [facility] [address] [place], [place] [address]    Culture  Setup Time   Final    GRAM NEGATIVE RODS IN BOTH AEROBIC AND ANAEROBIC BOTTLES CRITICAL RESULT CALLED TO, READ BACK BY AND VERIFIED WITH: PHARMD MARY ASHLYN ON 02/05/22 1723 BY DRT    Culture (A)  Final    ACINETOBACTER LWOFFII Sent to [place] for further susceptibility testing. Performed at [facility], [place] [address], [place], [place] [address]    Report Status PENDING  Incomplete  Blood Culture ID Panel (Reflexed)     Status: None   Collection Time: 02/04/22  5:30 PM  Result Value Ref Range Status   Enterococcus faecalis NOT DETECTED NOT DETECTED Final   Enterococcus Faecium NOT DETECTED NOT DETECTED Final   Listeria monocytogenes NOT DETECTED NOT DETECTED Final   Staphylococcus species NOT DETECTED NOT DETECTED Final   Staphylococcus aureus (BCID) NOT DETECTED NOT DETECTED Final   Staphylococcus epidermidis NOT DETECTED NOT DETECTED Final   Staphylococcus lugdunensis NOT DETECTED NOT DETECTED Final   Streptococcus species NOT DETECTED NOT DETECTED Final   Streptococcus agalactiae NOT DETECTED NOT DETECTED Final   Streptococcus pneumoniae NOT DETECTED NOT  DETECTED Final   Streptococcus pyogenes NOT DETECTED NOT DETECTED Final   A.calcoaceticus-baumannii NOT DETECTED NOT DETECTED Final   Bacteroides fragilis NOT DETECTED NOT DETECTED Final   Enterobacterales NOT DETECTED NOT DETECTED Final   Enterobacter cloacae complex NOT DETECTED NOT DETECTED Final   Escherichia coli NOT DETECTED NOT DETECTED Final   Klebsiella aerogenes NOT DETECTED NOT DETECTED Final   Klebsiella oxytoca NOT DETECTED NOT DETECTED Final   Klebsiella pneumoniae NOT DETECTED NOT DETECTED Final   Proteus species NOT DETECTED NOT DETECTED Final   Salmonella species NOT DETECTED NOT DETECTED Final   Serratia marcescens NOT DETECTED NOT DETECTED Final   Haemophilus influenzae NOT DETECTED NOT DETECTED Final   Neisseria meningitidis NOT DETECTED NOT DETECTED Final   Pseudomonas aeruginosa NOT DETECTED NOT DETECTED Final   Stenotrophomonas maltophilia NOT DETECTED NOT DETECTED Final   Candida albicans NOT DETECTED NOT DETECTED Final   Candida auris NOT DETECTED NOT DETECTED Final   Candida glabrata NOT DETECTED NOT DETECTED Final   Candida krusei NOT DETECTED NOT DETECTED Final   Candida parapsilosis NOT DETECTED NOT DETECTED Final   Candida tropicalis NOT DETECTED NOT DETECTED Final   Cryptococcus neoformans/gattii NOT DETECTED NOT DETECTED Final    Comment: Performed at [facility], [address], [place], [place] [address]  Culture, blood (routine x 2)     Status: None   Collection Time: 02/04/22  5:58 PM   Specimen: BLOOD  Result Value Ref Range Status   Specimen Description  Final    BLOOD BLOOD LEFT ARM Performed at [facility] [address], [place], [place] [address]    Special Requests   Final    BOTTLES DRAWN AEROBIC AND ANAEROBIC Blood Culture adequate volume Performed at [facility] [address], [place], [place] [address]    Culture   Final    NO GROWTH 5 DAYS Performed at [facility],  [place] [address] [place], [place] [address]    Report Status 02/09/2022 FINAL  Final  Culture, body fluid w Gram Stain-bottle     Status: None   Collection Time: 02/05/22  1:46 PM   Specimen: Fluid  Result Value Ref Range Status   Specimen Description FLUID PLEURAL  Final   Special Requests BOTTLES DRAWN AEROBIC AND ANAEROBIC  Final   Culture   Final    NO GROWTH 5 DAYS Performed at [facility], [place] [address] [place], [place] [address]    Report Status 02/10/2022 FINAL  Final  Gram stain     Status: None   Collection Time: 02/05/22  1:46 PM   Specimen: BLOOD  Result Value Ref Range Status   Specimen Description BLOOD PLEURAL  Final   Special Requests NONE  Final   Gram Stain   Final    CYTOSPIN SMEAR NO ORGANISMS SEEN NO WBC SEEN Performed at [facility], [place] [address], [place], [place] [address]    Report Status 02/05/2022 FINAL  Final  Culture, blood (Routine X 2) w Reflex to ID Panel     Status: None   Collection Time: 02/06/22  1:41 PM   Specimen: BLOOD  Result Value Ref Range Status   Specimen Description   Final    BLOOD RIGHT ANTECUBITAL Performed at [facility] [address], [place], [place] [address]    Special Requests   Final    BOTTLES DRAWN AEROBIC AND ANAEROBIC Blood Culture adequate volume Performed at [facility] [place] [address], [place], [place] [address]    Culture   Final    NO GROWTH 5 DAYS Performed at [facility], [place] [address] [place] [address] [place], [place] [address]    Report Status 02/11/2022 FINAL  Final  Culture, blood (Routine X 2) w Reflex to ID Panel     Status: None   Collection Time: 02/06/22  1:52 PM   Specimen: BLOOD  Result Value Ref Range Status   Specimen Description   Final    BLOOD BLOOD LEFT HAND Performed at [facility] [address], [place], [place] [address]    Special Requests   Final    BOTTLES DRAWN AEROBIC AND ANAEROBIC Blood Culture adequate  volume Performed at [facility] [address] [place] [address] [place], [place] [address]    Culture   Final    NO GROWTH 5 DAYS Performed at [facility], [place] [address] [place], [place] [address]    Report Status 02/11/2022 FINAL  Final      Radiology Studies: No results found.     LOS: 10 days   Giovani Neumeister [employer] on [email or web address]  02/14/2022, 10:15 AM

## 2022-02-14 NOTE — Progress Notes (Signed)
OT Cancellation Note  Patient Details Name: Drew Harrington MRN: [id number] DOB: 1946-01-22   Cancelled Treatment:    Reason Eval/Treat Not Completed: Medical issues which prohibited therapy Patients platelet count in 9 at this time with notes in chart about bleeding from [place]. OT to hold at this time and check back on 8/21.  Jackelyn Poling OTR/L, [place] Department Office# [phone] Pager# [phone] 02/14/2022, 3:04 PM

## 2022-02-14 NOTE — Plan of Care (Signed)
  Problem: Clinical Measurements: Goal: Will remain free from infection Outcome: Progressing   Problem: Clinical Measurements: Goal: Diagnostic test results will improve Outcome: Progressing   Problem: Clinical Measurements: Goal: Respiratory complications will improve Outcome: Progressing   Problem: Activity: Goal: Risk for activity intolerance will decrease Outcome: Progressing   Problem: Nutrition: Goal: Adequate nutrition will be maintained Outcome: Progressing

## 2022-02-14 NOTE — Progress Notes (Signed)
Wife reached out in regards to home equipment concerns. I discussed with case management and they noted the [employer] rep would be reaching back out to the wife. Followed back up with wife to communicate that [employer] would be reaching out to her.

## 2022-02-14 NOTE — TOC Progression Note (Addendum)
Transition of Care [facility]) - Progression Note    Patient Details  Name: Drew Harrington MRN: [id number] Date of Birth: [birth year]  Transition of Care [facility]) CM/SW Contact  Ross Ludwig, [place] Phone Number: 02/14/2022, 2:53 PM  Clinical Narrative:     CSW received a message from bedside nurse that patient's wife [name] was not happy with the equipment that was delivered by [employer].  Per patient's wife she requested a bariatric bed, and only received a bed with a crank on it.  CSW tried to explain to patient's wife that he does not qualify for a bariatric bed through insurance.  Patient's wife said she would be able to pay for it privately.  CSW was trying to explain to her how expensive it would be, she then said she did not like this CSW attitude and then she hung up on CSW.  CSW attempted to call her back 3 times and it went to voice mail.  CSW left a voice mail for her to call back.  CSW did not hear back from patient's wife.     CSW then called Jermaine at [facility] to explain patient's concern, he will let his manager know and they will follow up and explain to patient why he will not qualify for a semi electric bed.  Per Brenton Grills, insurance will only pay for a semielectric bed if patient is on hospice services.  Jermaine's will follow up with weekday [place] worker.   AC informed CSW that patient's wife called and was not happy with the answer CSW was given to her.  CSW explained the situation to [facility], and told AC to tell patient's wife that Celesta Aver will follow up with her tomorrow.   Expected Discharge Plan: [place] Barriers to Discharge: Continued Medical Work up  Expected Discharge Plan and Services Expected Discharge Plan: [place] In-house Referral: NA Discharge Planning Services: CM Consult Post Acute Care Choice: Durable Medical Equipment, Home Health Living arrangements for the past 2 months: [place]                 DME Arranged: Bedside  commode, Hospital bed, Overbed table, Shower stool DME Agency: [employer] Date DME Agency Contacted: 02/09/22   Representative spoke with at DME Agency: Brenton Grills HH Arranged: PT, Nurse's Aide, OT, Social Work [employer] Agency: [facility] Date Jay: 02/08/22   Representative spoke with at [place]: [place] (SDOH) Interventions    Readmission Risk Interventions    02/11/2022    2:01 PM 02/10/2022   10:02 AM  Readmission Risk Prevention Plan  Transportation Screening Complete   PCP or Specialist Appt within 3-5 Days Complete Complete  HRI or [place] Complete Complete  Social Work Consult for [place] Planning/Counseling Complete Complete  Palliative Care Screening  Not Applicable  Medication Review [employer]) Complete Complete

## 2022-02-14 NOTE — Plan of Care (Signed)
  Problem: Education: Goal: Knowledge of General Education information will improve Description: Including pain rating scale, medication(s)/side effects and non-pharmacologic comfort measures Outcome: Progressing   Problem: Coping: Goal: Level of anxiety will decrease Outcome: Progressing   Problem: Safety: Goal: Ability to remain free from injury will improve Outcome: Progressing   

## 2022-02-15 ENCOUNTER — Inpatient Hospital Stay (HOSPITAL_COMMUNITY): Payer: Medicare Other

## 2022-02-15 DIAGNOSIS — J189 Pneumonia, unspecified organism: Secondary | ICD-10-CM | POA: Diagnosis not present

## 2022-02-15 DIAGNOSIS — A419 Sepsis, unspecified organism: Secondary | ICD-10-CM | POA: Diagnosis not present

## 2022-02-15 DIAGNOSIS — I82409 Acute embolism and thrombosis of unspecified deep veins of unspecified lower extremity: Secondary | ICD-10-CM

## 2022-02-15 DIAGNOSIS — J90 Pleural effusion, not elsewhere classified: Secondary | ICD-10-CM | POA: Diagnosis not present

## 2022-02-15 DIAGNOSIS — I2699 Other pulmonary embolism without acute cor pulmonale: Secondary | ICD-10-CM

## 2022-02-15 DIAGNOSIS — Z7189 Other specified counseling: Secondary | ICD-10-CM | POA: Diagnosis not present

## 2022-02-15 DIAGNOSIS — D61818 Other pancytopenia: Secondary | ICD-10-CM | POA: Diagnosis not present

## 2022-02-15 DIAGNOSIS — R7881 Bacteremia: Secondary | ICD-10-CM | POA: Diagnosis not present

## 2022-02-15 DIAGNOSIS — Z515 Encounter for palliative care: Secondary | ICD-10-CM | POA: Diagnosis not present

## 2022-02-15 DIAGNOSIS — Z66 Do not resuscitate: Secondary | ICD-10-CM

## 2022-02-15 DIAGNOSIS — C3492 Malignant neoplasm of unspecified part of left bronchus or lung: Secondary | ICD-10-CM | POA: Diagnosis not present

## 2022-02-15 LAB — CBC WITH DIFFERENTIAL/PLATELET
Abs Immature Granulocytes: 0 10*3/uL (ref 0.00–0.07)
Basophils Absolute: 0 10*3/uL (ref 0.0–0.1)
Basophils Relative: 2 %
Eosinophils Absolute: 0.1 10*3/uL (ref 0.0–0.5)
Eosinophils Relative: 9 %
HCT: 33.1 % — ABNORMAL LOW (ref 39.0–52.0)
Hemoglobin: 11.4 g/dL — ABNORMAL LOW (ref 13.0–17.0)
Immature Granulocytes: 0 %
Lymphocytes Relative: 70 %
Lymphs Abs: 0.5 10*3/uL — ABNORMAL LOW (ref 0.7–4.0)
MCH: 31.1 pg (ref 26.0–34.0)
MCHC: 34.4 g/dL (ref 30.0–36.0)
MCV: 90.4 fL (ref 80.0–100.0)
Monocytes Absolute: 0.1 10*3/uL (ref 0.1–1.0)
Monocytes Relative: 8 %
Neutro Abs: 0.1 10*3/uL — CL (ref 1.7–7.7)
Neutrophils Relative %: 11 %
Platelets: 7 10*3/uL — CL (ref 150–400)
RBC: 3.66 MIL/uL — ABNORMAL LOW (ref 4.22–5.81)
RDW: 14.2 % (ref 11.5–15.5)
WBC: 0.7 10*3/uL — CL (ref 4.0–10.5)
nRBC: 0 % (ref 0.0–0.2)

## 2022-02-15 LAB — PREPARE PLATELET PHERESIS: Unit division: 0

## 2022-02-15 LAB — GLUCOSE, CAPILLARY
Glucose-Capillary: 76 mg/dL (ref 70–99)
Glucose-Capillary: 126 mg/dL — ABNORMAL HIGH (ref 70–99)
Glucose-Capillary: 95 mg/dL (ref 70–99)
Glucose-Capillary: 87 mg/dL (ref 70–99)

## 2022-02-15 LAB — BPAM PLATELET PHERESIS
Blood Product Expiration Date: 202308222359
ISSUE DATE / TIME: 202308201258
Unit Type and Rh: 6200

## 2022-02-15 MED ORDER — SODIUM CHLORIDE 0.9% IV SOLUTION
Freq: Once | INTRAVENOUS | Status: AC
Start: 2022-02-15 — End: 2022-02-15

## 2022-02-15 NOTE — Progress Notes (Signed)
  Daily Progress Note   Patient Name: Drew Harrington       Date: 02/15/2022 DOB: 02-Aug-1945  Age: 76 y.o. MRN#: [id number] Attending Physician: Bonnielee Haff, MD Primary Care Physician: Hoyt Koch, MD Admit Date: 02/04/2022 Length of Stay: 11 days  Reason for Consultation/Follow-up: {Reason for Consult:23484}  HPI/Patient Profile:  ***  Subjective:   Subjective: Chart Reviewed. Updates received. Patient Assessed. Created space and opportunity for patient  and family to explore thoughts and feelings regarding current medical situation.  Today's Discussion: ***  Review of Systems  Objective:   Vital Signs:  BP (!) 166/86 (BP Location: Right Arm)   Pulse 89   Temp 98.5 F (36.9 C) (Oral)   Resp (!) 22   Ht 5\' 9"  (1.753 m)   Wt 75.8 kg   SpO2 93%   BMI 24.68 kg/m   Physical Exam: Physical Exam  Palliative Assessment/Data: ***   Assessment & Plan:   Impression: Present on Admission:  Pleural effusion  HTN (hypertension)  Adenocarcinoma of left lung, stage 4 (HCC)  ***  SUMMARY OF RECOMMENDATIONS   ***  Symptom Management:  ***  Code Status: {Palliative Code status:23503}  Prognosis: {Palliative Care Prognosis:23504}  Discharge Planning: {Palliative dispostion:23505}  Discussed with: ***  Thank you for allowing [place] to participate in the care of [name] PMT will continue to support holistically.  Time Total: ***  Visit consisted of counseling and education dealing with the complex and emotionally intense issues of symptom management and palliative care in the setting of serious and potentially life-threatening illness. Greater than 50%  of this time was spent counseling and coordinating care related to the above assessment and plan.  Walden Field, NP Palliative Medicine Team  Team Phone # [phone] (Nights/Weekends)  02/24/2021, 8:17 AM

## 2022-02-15 NOTE — TOC Progression Note (Addendum)
Transition of Care [facility]) - Progression Note    Patient Details  Name: Drew Harrington MRN: [id number] Date of Birth: 1946-03-22  Transition of Care [facility]) CM/SW [place], RN Phone Number: 02/15/2022, 1:19 PM  Clinical Narrative:  Called patient's wife [name] per her request. [name] reports she has received the DME supplies however she doesn't like the bed. [name] indicates her friend told her she can rent an electric hospital bed, this RNCM encouraged [name] to contact the place she wants to rent from for assistance. This RNCM advised [name], she needs to speak with the current DME supplier Physicist, medical) about questions related to the DME. This RNCM advised [name], the hospital bed is the DME that insurance will cover, if she wants something additional she will need to speak with DME supplier about other options and pay out of pocket. This RNCM notified [employer] to call patient's wife [name].   Patient's wife [name] is now thinking of hospice services, will contact this RNCM with her decision. Last week patient's wife did not wish to pursue home hospice.  TOC will continue to follow.      Expected Discharge Plan: [place] Barriers to Discharge: Continued Medical Work up  Expected Discharge Plan and Services Expected Discharge Plan: [place] In-house Referral: NA Discharge Planning Services: CM Consult Post Acute Care Choice: Durable Medical Equipment, Home Health Living arrangements for the past 2 months: [place]                 DME Arranged: Bedside commode, Hospital bed, Overbed table, Shower stool DME Agency: [employer] Date DME Agency Contacted: 02/09/22   Representative spoke with at DME Agency: Brenton Grills HH Arranged: PT, Nurse's Aide, OT, Social Work [employer] Agency: [facility] Date [place]: 02/08/22   Representative spoke with at [place]: [place] (SDOH) Interventions     Readmission Risk Interventions    02/11/2022    2:01 PM 02/10/2022   10:02 AM  Readmission Risk Prevention Plan  Transportation Screening Complete   PCP or Specialist Appt within 3-5 Days Complete Complete  HRI or [place] Complete Complete  Social Work Consult for [place] Planning/Counseling Complete Complete  Palliative Care Screening  Not Applicable  Medication Review Press [employer]) Complete Complete

## 2022-02-15 NOTE — Care Management Important Message (Signed)
Important Message  Patient Details IM Letter placed in Patients room. Name: Drew Harrington MRN: [id number] Date of Birth: 09-12-1945   Medicare Important Message Given:  Yes     Kerin Salen 02/15/2022, 12:36 PM

## 2022-02-15 NOTE — Progress Notes (Signed)
RLE venous duplex has been completed.   Results can be found under chart review under CV PROC. 02/15/2022 3:38 PM Nechelle Petrizzo RVT, RDMS

## 2022-02-15 NOTE — Progress Notes (Signed)
[facility] PROGRESS NOTE   NERO SAWATZKY CBJ:[id number] DOB: 14-Jun-1946 DOA: 02/04/2022  PCP: Hoyt Koch, MD  Brief History/Interval Summary: 76 y.o. male with medical history significant of stage IVB non-small cell lung cancer, adenocarcinoma with large apical LUL mass, with mediastinal, right cervical and bilateral adrenal metastases, PE involving large right main on Lovenox (dx 12/2021), HTN, dementia, CKD 3a who presents with diarrhea and weakness.  Patient just received first session of chemotherapy on 8/9 and then started to have diarrhea.  He slipped in the bathtub while trying to wash out and subsequently was sent to ED.  In the ED, patient had a fever, was tachycardic and tachypneic.  With elevated creatinine.  CT head and cervical spine were negative for injuries.  He was also found to have large left pleural effusion.  His Lovenox was switched to IV heparin in anticipation for thoracentesis.  Patient underwent thoracentesis 8/11 which removed 1.1 L fluid. Hospitalization further complicated by blood culture +Acinectobacter.    Consultants: Phone discussion with ID.  Medical oncology  Procedures: Thoracentesis    Subjective/Interval History: Patient denies any complaints this morning.  Discussed with nursing staff.  No bleeding noted from around the Port-A-Cath site.     Assessment/Plan:  Community-acquired pneumonia/severe sepsis present on admission/chronic respiratory failure with hypoxia Sepsis was present on admission with fever, tachycardia, tachypnea with end-organ damage including acute kidney injury and respiratory failure. Patient treated with antibiotics.  Respiratory status has improved.  Seems to be doing well on room air for the most part.  Requiring oxygen at times.  Acinetobacter bacteremia Found in 1 aerobic bottle on 8/10.  Repeat cultures from 8/12 negative. Initially on cefepime and azithromycin.  Changed over initially to meropenem and  then changed back to cefepime.  Patient completed 7 days of cefepime on 8/18. Susceptibility data is still pending.  Had to be sent out to [place].  Susceptibility report is finally available.  Bacteria was noted to be sensitive to cefepime.   We will continue to monitor off of antibiotics for now.  Remains afebrile.  Large left pleural effusion Underwent thoracentesis on 8/11 with removal of 1.1 L of fluid.  Was noted to be exudative. No malignant cells noted on cytology.  Cultures without any growth. Respiratory status is stable.  Pancytopenia including normocytic anemia, leukopenia and thrombocytopenia Most likely due to chemotherapy that he received on 8/9.   He was started on Granix on 8/17.   Continue to have significant neutropenia.  His platelet counts also started decreasing. He was transfused 1 unit of platelets on 8/20.  Counts are lower again today.  2 units ordered today.  Hold his Lovenox.  Acute kidney injury on chronic kidney disease stage IIIa/hypokalemia/hypomagnesemia Renal function noted to be normal.  Potassium is improved today.  Magnesium noted to be 1.6 and supplemented.    Acute metabolic encephalopathy Multifactorial.  Appears to have resolved.  History of PE and DVT Diagnosed with acute PE and right lower extremity DVT in July 2023.   Has been on Lovenox even in the outpatient setting.  Due to significant drop in platelet counts requiring transfusion and some bleeding around the Port-A-Cath site we will hold Lovenox for now. Looks like plan is for transition to oral anticoagulants after 1 to 2 months of Lovenox. Lower extremity Doppler studies to check if DVT has improved.  Stage IV adenocarcinoma of left lung Followed by Dr. Julien Nordmann.  On systemic chemotherapy.  Port-A-Cath placed by interventional radiology.  Palliative care was consulted. Last chemotherapy on 8/9.  Does not appear that he received any marrow stimulating agents during that time.  Diabetes  mellitus type 2, controlled HbA1c 6.3.  Continue SSI.  CBGs are reasonably well controlled.  Essential hypertension Patient noted to be on losartan, metoprolol.  Blood pressure remains poorly controlled despite adjustment to his antihypertensives.  Amlodipine was added.  Blood pressure remains poorly controlled.  May need to increase the dose of amlodipine.  DVT Prophylaxis: Lovenox discontinued for now.   Code Status: DNR Family Communication: Wife being updated on every other day basis Disposition Plan: Anticipate discharge home when his counts start improving.  Status is: Inpatient Remains inpatient appropriate because: Pancytopenia    Medications: Scheduled:  amLODipine  5 mg Oral Daily   Chlorhexidine Gluconate Cloth  6 each Topical Daily   feeding supplement  237 mL Oral BID BM   guaiFENesin  600 mg Oral BID   insulin aspart  0-9 Units Subcutaneous TID WC   losartan  100 mg Oral Daily   metoprolol succinate  100 mg Oral Daily   sodium chloride flush  10-40 mL Intracatheter Q12H   Tbo-filgastrim (GRANIX) SQ  480 mcg Subcutaneous q1800   Continuous:   ZOX:[id number], guaiFENesin, mouth rinse, sodium chloride flush  Antibiotics: Anti-infectives (From admission, onward)    Start     Dose/Rate Route Frequency Ordered Stop   02/11/22 0800  ceFEPIme (MAXIPIME) 2 g in sodium chloride 0.9 % 100 mL IVPB        2 g 200 mL/hr over 30 Minutes Intravenous Every 8 hours 02/11/22 0727 02/12/22 2223   02/08/22 2200  ceFEPIme (MAXIPIME) 2 g in sodium chloride 0.9 % 100 mL IVPB  Status:  Discontinued        2 g 200 mL/hr over 30 Minutes Intravenous Every 12 hours 02/08/22 1317 02/11/22 0727   02/08/22 0930  meropenem (MERREM) 1 g in sodium chloride 0.9 % 100 mL IVPB  Status:  Discontinued        1 g 200 mL/hr over 30 Minutes Intravenous Every 8 hours 02/08/22 0830 02/08/22 1235   02/07/22 1430  meropenem (MERREM) 1 g in sodium chloride 0.9 % 100 mL IVPB  Status:  Discontinued         1 g 200 mL/hr over 30 Minutes Intravenous Every 12 hours 02/07/22 1337 02/08/22 0830   02/05/22 2200  ceFEPIme (MAXIPIME) 2 g in sodium chloride 0.9 % 100 mL IVPB  Status:  Discontinued        2 g 200 mL/hr over 30 Minutes Intravenous Every 12 hours 02/05/22 1738 02/07/22 1337   02/05/22 1800  cefTRIAXone (ROCEPHIN) 2 g in sodium chloride 0.9 % 100 mL IVPB  Status:  Discontinued        2 g 200 mL/hr over 30 Minutes Intravenous Every 24 hours 02/04/22 2026 02/05/22 1738   02/05/22 1800  azithromycin (ZITHROMAX) 500 mg in sodium chloride 0.9 % 250 mL IVPB  Status:  Discontinued        500 mg 250 mL/hr over 60 Minutes Intravenous Every 24 hours 02/04/22 2026 02/07/22 1338   02/04/22 1800  cefTRIAXone (ROCEPHIN) 1 g in sodium chloride 0.9 % 100 mL IVPB        1 g 200 mL/hr over 30 Minutes Intravenous  Once 02/04/22 1757 02/04/22 2020   02/04/22 1800  azithromycin (ZITHROMAX) 500 mg in sodium chloride 0.9 % 250 mL IVPB        500  mg 250 mL/hr over 60 Minutes Intravenous  Once 02/04/22 1757 02/04/22 2146       Objective:  Vital Signs  Vitals:   02/14/22 1319 02/14/22 1524 02/14/22 1933 02/15/22 0518  BP: (!) 142/83 136/84 (!) 157/85 (!) 172/89  Pulse: 88 89 88 84  Resp: 20 18 18 18   Temp: 98.9 F (37.2 C) 99 F (37.2 C) 98.8 F (37.1 C) 98.9 F (37.2 C)  TempSrc: Oral Oral    SpO2: 95% 94% 97% 95%  Weight:      Height:        Intake/Output Summary (Last 24 hours) at 02/15/2022 1059 Last data filed at 02/15/2022 0208 Gross per 24 hour  Intake 430 ml  Output 1000 ml  Net -570 ml    Filed Weights   02/04/22 2050 02/08/22 1702  Weight: 78.3 kg 75.8 kg    General appearance: Awake alert.  In no distress Resp: Few crackles bilateral bases.  Coarse breath sounds bilaterally.  No wheezing or rhonchi. Cardio: S1-S2 is normal regular.  No S3-S4.  No rubs murmurs or bruit GI: Abdomen is soft.  Nontender nondistended.  Bowel sounds are present normal.  No masses  organomegaly Extremities: No edema.  Full range of motion of lower extremities. Neurologic:   No focal neurological deficits.      Lab Results:  Data Reviewed: I have personally reviewed following labs and reports of the imaging studies  CBC: Recent Labs  Lab 02/12/22 0315 02/13/22 0532 02/14/22 0516 02/14/22 1842 02/15/22 0359  WBC 1.0* 0.6* 0.5* 0.6* 0.7*  NEUTROABS 0.6* 0.2* 0.1* 0.1* 0.1*  HGB 11.5* 11.0* 10.9* 10.5* 11.4*  HCT 33.6* 31.6* 32.1* 30.8* 33.1*  MCV 91.1 90.0 90.7 90.1 90.4  PLT 55* 23* 9* 14* 7*     Basic Metabolic Panel: Recent Labs  Lab 02/10/22 0455 02/11/22 0315 02/13/22 0532 02/14/22 0516  NA 144 140 137 138  K 3.3* 3.6 3.0* 3.7  CL 110 108 104 107  CO2 28 26 26 26   GLUCOSE 92 94 94 90  BUN 24* 24* 21 20  CREATININE 1.10 0.95 0.84 0.71  CALCIUM 8.4* 8.2* 8.4* 8.5*  MG  --  1.8  --  1.6*     GFR: Estimated Creatinine Clearance: 78.6 mL/min (by C-G formula based on SCr of 0.71 mg/dL).   CBG: Recent Labs  Lab 02/14/22 0742 02/14/22 1154 02/14/22 1607 02/14/22 1930 02/15/22 0747  GLUCAP 92 89 93 91 76       Recent Results (from the past 240 hour(s))  Culture, body fluid w Gram Stain-bottle     Status: None   Collection Time: 02/05/22  1:46 PM   Specimen: Fluid  Result Value Ref Range Status   Specimen Description FLUID PLEURAL  Final   Special Requests BOTTLES DRAWN AEROBIC AND ANAEROBIC  Final   Culture   Final    NO GROWTH 5 DAYS Performed at [facility], [place] [address] [place], [place] [address]    Report Status 02/10/2022 FINAL  Final  Gram stain     Status: None   Collection Time: 02/05/22  1:46 PM   Specimen: BLOOD  Result Value Ref Range Status   Specimen Description BLOOD PLEURAL  Final   Special Requests NONE  Final   Gram Stain   Final    CYTOSPIN SMEAR NO ORGANISMS SEEN NO WBC SEEN Performed at [facility], [place] [address] [place], [place] [address]    Report Status  02/05/2022 FINAL   Final  Culture, blood (Routine X 2) w Reflex to ID Panel     Status: None   Collection Time: 02/06/22  1:41 PM   Specimen: BLOOD  Result Value Ref Range Status   Specimen Description   Final    BLOOD RIGHT ANTECUBITAL Performed at [facility] [address] [place], [place] [address]    Special Requests   Final    BOTTLES DRAWN AEROBIC AND ANAEROBIC Blood Culture adequate volume Performed at [facility] [address], [place], [place] [address]    Culture   Final    NO GROWTH 5 DAYS Performed at [facility], [place] [address], [place], [place] [address]    Report Status 02/11/2022 FINAL  Final  Culture, blood (Routine X 2) w Reflex to ID Panel     Status: None   Collection Time: 02/06/22  1:52 PM   Specimen: BLOOD  Result Value Ref Range Status   Specimen Description   Final    BLOOD BLOOD LEFT HAND Performed at [facility] [address], [place], [place] [address]    Special Requests   Final    BOTTLES DRAWN AEROBIC AND ANAEROBIC Blood Culture adequate volume Performed at [facility] [address], [place], [place] [address]    Culture   Final    NO GROWTH 5 DAYS Performed at [facility], [place] [address] [place], [place] [address]    Report Status 02/11/2022 FINAL  Final      Radiology Studies: No results found.     LOS: 11 days   Jermeka Schlotterbeck [employer] on [email or web address]  02/15/2022, 10:59 AM

## 2022-02-15 NOTE — Plan of Care (Signed)
  Problem: Activity: Goal: Risk for activity intolerance will decrease Outcome: Progressing   Problem: Elimination: Goal: Will not experience complications related to bowel motility Outcome: Progressing Goal: Will not experience complications related to urinary retention Outcome: Progressing   Problem: Safety: Goal: Ability to remain free from injury will improve Outcome: Progressing   

## 2022-02-15 NOTE — Progress Notes (Signed)
PT Cancellation Note  Patient Details Name: RAYSHON ALBAUGH MRN: [id number] DOB: 09/18/1945   Cancelled Treatment:    Reason Eval/Treat Not Completed: Medical issues which prohibited therapy;Other (comment) (platelets (<7) and WBC low. Pt recieveing platelets. Will follow up at later date/time as schedule allows.)  Gwynneth Albright PT, DPT Acute Rehabilitation Services Office [phone] Pager [phone]

## 2022-02-15 NOTE — Progress Notes (Addendum)
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 The molecular studies by [email or web address] showed no actionable mutations but PD-L1 expression is 99%  He is currently receiving systemic treatment with carboplatin for an AUC of 5, Alimta 500 mg per metered squared, and Keytruda 200 mg IV every 3 weeks.  Has developed pancytopenia secondary to chemotherapy.  He was treated for bacteremia and completed antibiotics on 02/12/2022.  He remains pancytopenic secondary to chemotherapy.  He continues on Granix 480 mcg subcu daily.  Recommend continuation of Granix until [place] is 1.5 or higher.  His hemoglobin is 11.4 today.  No PRBC transfusion is indicated.  Platelet count is down to 7000 today.  Recommend platelet transfusion for platelet count less than 20,000 or active bleeding.  He has a pulmonary embolus involving large right main and multiple peripheral pulmonary emboli diagnosed in July 2023.  He is currently on treatment with Lovenox 1 Mg/KG every 12 hours.  Given the significant thrombocytopenia, recommend holding Lovenox until platelet count is 50,000 or higher.  Plan is to transition to Eliquis after about 1 to 2 months of treatment with Lovenox.  Kristin Curcio  SUBJECTIVE: Remains afebrile.  Recently completed a course of antibiotics Acinetobacter bacteremia.  Was initially started on cefepime and azithromycin and then changed to meropenem and then changed back to cefepime.  Completed a 7-day course on 02/12/2022.  Has been on Granix  480 mcg subcu daily since 02/11/2022.  Continues to have pancytopenia.  Received 1 unit of platelets on 02/14/2022.  Resting quietly this morning.  No bleeding reported.   Oncology History  Adenocarcinoma of left lung, stage 4 (HCC)  01/28/2022 Initial Diagnosis   Adenocarcinoma of left lung, stage 4 ([place])   01/28/2022 Cancer Staging   Staging form: Lung, AJCC 8th Edition - Clinical: Stage IVB (cT3, cN3, cM1c) - Signed by Curt Bears, MD on 01/28/2022   02/03/2022 -  Chemotherapy   Patient is on Treatment Plan : LUNG Carboplatin (5) + Pemetrexed (500) + Pembrolizumab (200) D1 q21d Induction x 4 cycles / Maintenance Pemetrexed (500) + Pembrolizumab (200) D1 q21d        REVIEW OF SYSTEMS:   Review of Systems  Constitutional:  Negative for chills and fever.  HENT: Negative.    Respiratory: Negative.    Cardiovascular: Negative.   Gastrointestinal: Negative.   Skin: Negative.   Neurological: Negative.   Endo/Heme/Allergies: Negative.     I have reviewed the past medical history, past surgical history, social history and family history with the patient and they are unchanged from previous note.   PHYSICAL EXAMINATION: ECOG PERFORMANCE STATUS: 2 - Symptomatic, <50% confined to bed  Vitals:   02/14/22 1933 02/15/22 0518  BP: (!) 157/85 (!) 172/89  Pulse: 88 84  Resp: 18 18  Temp: 98.8 F (37.1 C) 98.9 F (37.2 C)  SpO2: 97% 95%   Filed Weights   02/04/22 2050 02/08/22 1702  Weight: 78.3 kg 75.8 kg    Intake/Output from previous day: 08/20 0701 - 08/21 0700 In: 550 [P.O.:220; I.V.:50; Blood:280] Out: 1000 [Urine:1000]  Physical Exam Vitals reviewed.  Constitutional:      General: He is not in acute distress. HENT:     Head: Normocephalic.  Eyes:     General: No scleral icterus.    Conjunctiva/sclera: Conjunctivae normal.  Cardiovascular:     Rate and Rhythm: Normal rate.  Pulmonary:     Effort: Pulmonary effort is normal. No respiratory distress.  Abdominal:      Palpations: Abdomen is soft.     Tenderness: There is no abdominal tenderness.  Skin:    General: Skin is warm and dry.  Neurological:     Mental Status: He is alert and oriented to person, place, and time.     LABORATORY DATA:  I have reviewed the data as listed    Latest Ref Rng & Units 02/14/2022    5:16 AM 02/13/2022    5:32 AM 02/11/2022    3:15 AM  CMP  Glucose 70 - 99 mg/dL 90  94  94   BUN 8 - 23 mg/dL $Remove'20  21  24   'IiKbvie$ Creatinine 0.61 - 1.24 mg/dL 0.71  0.84  0.95   Sodium 135 - 145 mmol/L 138  137  140   Potassium 3.5 - 5.1 mmol/L 3.7  3.0  3.6   Chloride 98 - 111 mmol/L 107  104  108   CO2 22 - 32 mmol/L $RemoveB'26  26  26   'TZOAPnHb$ Calcium 8.9 - 10.3 mg/dL 8.5  8.4  8.2     Lab Results  Component Value Date   WBC 0.7 (LL) 02/15/2022   HGB 11.4 (L) 02/15/2022   HCT 33.1 (L) 02/15/2022   MCV 90.4 02/15/2022   PLT 7 (LL) 02/15/2022   NEUTROABS 0.1 (LL) 02/15/2022    No results found for: "CEA1", "CEA", "CAN199", "CA125", "PSA1"  IR IMAGING GUIDED PORT INSERTION  Result Date: 02/08/2022 INDICATION: LEFT lung cancer. EXAM: IMPLANTED PORT A CATH PLACEMENT WITH ULTRASOUND AND FLUOROSCOPIC GUIDANCE MEDICATIONS: None ANESTHESIA/SEDATION: Moderate (conscious) sedation was employed during this procedure. A total of Versed 1 mg and Fentanyl 100 mcg was administered intravenously. Moderate Sedation Time: 36 minutes. The patient's level of consciousness and vital signs were monitored continuously by radiology nursing throughout the procedure under my direct supervision. FLUOROSCOPY TIME:  Fluoroscopic dose; 1 mGy COMPLICATIONS: None immediate. PROCEDURE: The procedure, risks, benefits, and alternatives were explained to the patient. Questions regarding the procedure were encouraged and answered. The patient understands and consents to the procedure. The RIGHT neck and chest were prepped with chlorhexidine in a sterile fashion, and a sterile drape was applied covering the operative field. Maximum  barrier sterile technique with sterile gowns and gloves were used for the procedure. A timeout was performed prior to the initiation of the procedure. Local anesthesia was provided with 1% lidocaine with epinephrine. After creating a small venotomy incision, a micropuncture kit was utilized to access the external jugular vein under direct, real-time ultrasound guidance. Ultrasound image documentation was performed. The microwire was kinked to measure appropriate catheter length. A subcutaneous port pocket was then created along the upper chest wall utilizing a combination of sharp and blunt dissection. The pocket was irrigated with sterile saline. A single lumen ISP power injectable port was chosen for placement. The 8 Fr catheter was tunneled from the port pocket site to the venotomy incision. The port was placed in the pocket. The external catheter was trimmed to appropriate length. At the venotomy, an 8 Fr peel-away sheath was placed over a guidewire under fluoroscopic guidance. The catheter was then  placed through the sheath and the sheath was removed. Final catheter positioning was confirmed and documented with a fluoroscopic spot radiograph. The port was accessed with a Huber needle, aspirated and flushed with heparinized saline. The port pocket incision was closed with interrupted 3-0 Vicryl suture then Dermabond was applied, including at the venotomy incision. Dressings were placed. The patient tolerated the procedure well without immediate post procedural complication. FINDINGS: Thrombosed RIGHT internal jugular vein. Patent RIGHT external jugular vein and EJ-subclavian confluence. IMPRESSION: Successful placement of a RIGHT external jugular approach power injectable Port-A-Cath. The tip of the catheter is positioned within the proximal RIGHT atrium. The catheter is ready for immediate use. Michaelle Birks, MD [facility] Radiology Electronically Signed   By: Michaelle Birks M.D.   On: 02/08/2022 17:08   DG CHEST PORT 1 VIEW  Result Date: 02/06/2022 CLINICAL DATA:  Increased breath sounds EXAM: PORTABLE CHEST 1 VIEW COMPARISON:  02/05/2022 FINDINGS: Cardiac shadow is enlarged but stable. Mediastinum is again prominent similar to that seen on the prior exam consistent with the known left upper lobe mass lesion. Persistent left-sided pleural effusion is noted stable in appearance from the prior exam. No pneumothorax is seen. Right lung remains clear. No acute bony abnormality is noted. IMPRESSION: Persistent left-sided pleural effusions stable from the previous day. Known left upper lobe mass lesion medially. Electronically Signed   By: Inez Catalina M.D.   On: 02/06/2022 19:36   DG Chest Port 1 View  Result Date: 02/05/2022 CLINICAL DATA:  Left thoracentesis EXAM: PORTABLE CHEST 1 VIEW COMPARISON:  Radiograph 02/04/2022 FINDINGS: Significantly decreased left pleural effusion, now small to moderate size after thoracentesis. No evidence of pneumothorax. Unchanged cardiomediastinal silhouette. Unchanged large left upper lobe mass. IMPRESSION: No evidence of pneumothorax after thoracentesis. Decreased size of the left pleural effusion. Electronically Signed   By: Maurine Simmering M.D.   On: 02/05/2022 14:05   US THORACENTESIS ASP PLEURAL SPACE W/IMG GUIDE  Result Date: 02/05/2022 INDICATION: Patient with history of metastatic lung cancer with large left pleural effusion. For diagnostic and therapeutic left thoracentesis. EXAM: ULTRASOUND GUIDED DIAGNOSTIC AND THERAPEUTIC LEFT THORACENTESIS MEDICATIONS: 10 mL 1 % lidocaine COMPLICATIONS: None immediate. PROCEDURE: An ultrasound guided thoracentesis was thoroughly discussed with the patient and questions answered. The benefits, risks, alternatives and complications were also discussed. The patient understands and wishes to proceed with the procedure. Written consent was obtained by patient's wife. Ultrasound was performed to  localize and mark an adequate pocket of fluid in the left chest. The area was then prepped and draped in the normal sterile fashion. 1% Lidocaine was used for local anesthesia. Under ultrasound guidance a 6 Fr Safe-T-Centesis catheter was introduced. Thoracentesis was performed. The catheter was removed and a dressing applied. FINDINGS: A total of approximately 1.1 L of clear, amber fluid was removed. Samples were sent to the laboratory as requested by the clinical team. IMPRESSION: Successful ultrasound guided left thoracentesis yielding 1.1 L of pleural fluid. Read by: Narda Rutherford, AGNP-BC Electronically Signed   By: Corrie Mckusick D.O.   On: 02/05/2022 14:01   CT Head Wo Contrast  Result Date: 02/04/2022 CLINICAL DATA:  Generalized weakness. EXAM: CT HEAD WITHOUT CONTRAST CT CERVICAL SPINE WITHOUT CONTRAST TECHNIQUE: Multidetector CT imaging of the head and cervical spine was performed following the standard protocol without intravenous contrast. Multiplanar CT image reconstructions of the cervical spine were also generated. RADIATION DOSE REDUCTION: This exam was performed according to the departmental dose-optimization program which  includes automated exposure control, adjustment of the mA and/or kV according to patient size and/or use of iterative reconstruction technique. COMPARISON:  12/30/2021 CT head, 01/24/2022 MRI head; no prior cervical spine CT, correlation is made with 04/17/2013 cervical spine radiographs FINDINGS: CT HEAD FINDINGS Evaluation is somewhat limited by motion artifact. Brain: No evidence of acute infarct, hemorrhage, mass, mass effect, or midline shift. No hydrocephalus or extra-axial fluid collection. Remote left occipital infarct. Redemonstrated lacunar infarcts in the left basal ganglia, left corona radiata, and bilateral thalami. Periventricular white matter changes, likely the sequela of chronic small vessel ischemic disease. Vascular: No hyperdense vessel. Skull: Normal.  Negative for fracture or focal lesion. Sinuses/Orbits: Partial opacification of the posterior left ethmoid air cells and left sphenoid sinus. Status post bilateral lens replacements. Other: The mastoid air cells are well aerated. CT CERVICAL SPINE FINDINGS Alignment: Straightening of the normal cervical lordosis. No listhesis. Skull base and vertebrae: No acute fracture. No primary bone lesion or focal pathologic process. Soft tissues and spinal canal: No prevertebral fluid or swelling. No visible canal hematoma. Disc levels: Mild degenerative changes for age. No high-grade spinal canal stenosis. Upper chest: Left apical mass, which extends into the inferior left neck, which is better evaluated on the 01/15/2022 CT chest. Moderate right and large left pleural effusions, new on the right and increased on the left compared to 01/15/2022 Other: Presumed nodal metastasis in the right neck (series 4, image 40) is poorly evaluated in the absence of intravenous contrast. Additional prominent right level 2 lymph nodes (series 4, image 53) and left level 3 lymph node (series 4, image 80). IMPRESSION: 1.  No acute intracranial process. 2.  No acute fracture or traumatic listhesis in the cervical spine. 3. Known left apical mass is incompletely evaluated. Moderate right and large left pleural effusions. 4. Presumed nodal metastases in the bilateral cervical lymph nodes. Electronically Signed   By: Merilyn Baba M.D.   On: 02/04/2022 16:19   CT Cervical Spine Wo Contrast  Result Date: 02/04/2022 CLINICAL DATA:  Generalized weakness. EXAM: CT HEAD WITHOUT CONTRAST CT CERVICAL SPINE WITHOUT CONTRAST TECHNIQUE: Multidetector CT imaging of the head and cervical spine was performed following the standard protocol without intravenous contrast. Multiplanar CT image reconstructions of the cervical spine were also generated. RADIATION DOSE REDUCTION: This exam was performed according to the departmental dose-optimization program  which includes automated exposure control, adjustment of the mA and/or kV according to patient size and/or use of iterative reconstruction technique. COMPARISON:  12/30/2021 CT head, 01/24/2022 MRI head; no prior cervical spine CT, correlation is made with 04/17/2013 cervical spine radiographs FINDINGS: CT HEAD FINDINGS Evaluation is somewhat limited by motion artifact. Brain: No evidence of acute infarct, hemorrhage, mass, mass effect, or midline shift. No hydrocephalus or extra-axial fluid collection. Remote left occipital infarct. Redemonstrated lacunar infarcts in the left basal ganglia, left corona radiata, and bilateral thalami. Periventricular white matter changes, likely the sequela of chronic small vessel ischemic disease. Vascular: No hyperdense vessel. Skull: Normal. Negative for fracture or focal lesion. Sinuses/Orbits: Partial opacification of the posterior left ethmoid air cells and left sphenoid sinus. Status post bilateral lens replacements. Other: The mastoid air cells are well aerated. CT CERVICAL SPINE FINDINGS Alignment: Straightening of the normal cervical lordosis. No listhesis. Skull base and vertebrae: No acute fracture. No primary bone lesion or focal pathologic process. Soft tissues and spinal canal: No prevertebral fluid or swelling. No visible canal hematoma. Disc levels: Mild degenerative changes for age. No high-grade spinal  canal stenosis. Upper chest: Left apical mass, which extends into the inferior left neck, which is better evaluated on the 01/15/2022 CT chest. Moderate right and large left pleural effusions, new on the right and increased on the left compared to 01/15/2022 Other: Presumed nodal metastasis in the right neck (series 4, image 40) is poorly evaluated in the absence of intravenous contrast. Additional prominent right level 2 lymph nodes (series 4, image 53) and left level 3 lymph node (series 4, image 80). IMPRESSION: 1.  No acute intracranial process. 2.  No acute  fracture or traumatic listhesis in the cervical spine. 3. Known left apical mass is incompletely evaluated. Moderate right and large left pleural effusions. 4. Presumed nodal metastases in the bilateral cervical lymph nodes. Electronically Signed   By: Merilyn Baba M.D.   On: 02/04/2022 16:19   DG Chest 1 View  Result Date: 02/04/2022 CLINICAL DATA:  Syncope EXAM: CHEST  1 VIEW COMPARISON:  Chest x-ray dated January 22, 2022 FINDINGS: Visualized cardiac and mediastinal contours are within normal limits. Left suprahilar mass compatible with known malignancy. New large right pleural effusion. Evidence of pneumothorax. IMPRESSION: Left suprahilar mass with new large left pleural effusion. Electronically Signed   By: Yetta Glassman M.D.   On: 02/04/2022 15:42   NM PET Image Initial (PI) Skull Base To Thigh (F-18 FDG)  Result Date: 01/27/2022 CLINICAL DATA:  Initial treatment strategy for non-small cell lung cancer. EXAM: NUCLEAR MEDICINE PET SKULL BASE TO THIGH TECHNIQUE: 7.9 mCi F-18 FDG was injected intravenously. Full-ring PET imaging was performed from the skull base to thigh after the radiotracer. CT data was obtained and used for attenuation correction and anatomic localization. Fasting blood glucose: 126 mg/dl COMPARISON:  Chest CT 01/15/2022 FINDINGS: Mediastinal blood pool activity: SUV max 2.4 Liver activity: SUV max NA NECK: Enlarged hypermetabolic RIGHT level II lymph node with SUV max equal 13.8 on image 24. Node measures 16 mm beneath sternocleidomastoid Hypermetabolic RIGHT supraglottic tissue adjacent to the epiglottis with SUV max equal 12.9 Incidental CT findings: none CHEST: Large LEFT upper lobe mass bordering the mediastinum measuring 10.5 x 6.1 cm. The mass is intensely hypermetabolic peripherally SUV max equal 20.4 Evidence of direct extension into the AP window with 3.8 cm mass with SUV max equal 17.2 Large layering LEFT effusion. Within the layering effusion there is hypermetabolic lesion  medially along the pleural surface with SUV max equal 18.4 on image 106 Incidental CT findings: none ABDOMEN/PELVIS: Bilateral hypermetabolic adrenal metastasis. The LEFT adrenal gland is enlarged to 3 cm with SUV max equal 19.3 Two hypermetabolic peritoneal implants. One in the RIGHT abdomen measures 2.2 cm (142) with SUV max equal 14.3. Smaller lesion in the ventral peritoneal space on the LEFT measures 10 mm on image 148 SUV max equal 12.5. No evidence of bowel obstruction. Incidental CT findings: none SKELETON: Single focus of metabolic activity in the LEFT inferior pubic ramus with SUV max equal 12.4 on image 90. No CT correlation Incidental CT findings: none IMPRESSION: 1. Large LEFT upper lobe mass with intense metabolic activity bordering the mediastinum and transverse arch of the aorta consistent with bronchogenic carcinoma 2. AP window hypermetabolic mass. Pleural metastasis in the LEFT lower lobe. 3. Hypermetabolic RIGHT level II metastatic cervical lymph nodes. 4. Hypermetabolic RIGHT supraglottic tissue. Unusual location for metastatic disease. Cannot exclude a primary lesion. 5. Bilateral hyper enlarged hypermetabolic adrenal metastasis. 6. Two  hypermetabolic peritoneal metastatic implants the abdomen 7. Single hypermetabolic skeletal metastasis to the LEFT inferior  pubic ramus. Electronically Signed   By: Suzy Bouchard M.D.   On: 01/27/2022 16:49   MR BRAIN W WO CONTRAST  Result Date: 01/25/2022 CLINICAL DATA:  Non-small cell lung cancer staging EXAM: MRI HEAD WITHOUT AND WITH CONTRAST TECHNIQUE: Multiplanar, multiecho pulse sequences of the brain and surrounding structures were obtained without and with intravenous contrast. CONTRAST:  34mL GADAVIST GADOBUTROL 1 MMOL/ML IV SOLN COMPARISON:  01/16/2022 FINDINGS: Brain: No enhancement or edema to suggest metastatic disease. No recent infarction, hemorrhage, hydrocephalus, extra-axial collection or mass lesion. Advanced chronic small vessel  ischemia with ischemic gliosis and chronic small vessel infarcts in the left basal ganglia, bilateral thalamus, and right pons. Small remote left occipital cortex infarct. Chronic microhemorrhages in the deep brain attributed to the same. Vascular: Major flow voids and vascular enhancements are preserved Skull and upper cervical spine: No focal marrow lesion. Sinuses/Orbits: Negative Other: Partially covered mass in the right lateral neck with heterogeneous/necrotic appearance and 3.4 cm size, likely nodal. IMPRESSION: 1. Heterogeneous mass in the right lateral neck, presumably nodal metastasis. 2. Negative for intracranial metastasis. 3. Advanced chronic small vessel disease. Electronically Signed   By: Jorje Guild M.D.   On: 01/25/2022 14:33   MR BRAIN WO CONTRAST  Result Date: 01/16/2022 CLINICAL DATA:  Non-small cell lung cancer staging EXAM: MRI HEAD WITHOUT CONTRAST TECHNIQUE: Multiplanar, multiecho pulse sequences of the brain and surrounding structures were obtained without intravenous contrast. COMPARISON:  Head CT 12/30/2021 FINDINGS: Brain: No swelling or masslike finding to suggest metastatic disease. Chronic small vessel ischemic gliosis in the cerebral white matter with chronic perforator infarct at the left basal ganglia. Chronic lacunar infarcts in the bilateral thalami with numerous remote micro hemorrhages in the deep brain. Small remote left occipital and right parietal cortex infarcts. Wallerian changes seen in the bilateral middle cerebellar peduncle associated with a chronic right pontine lacune. No acute hemorrhage, acute infarct, hydrocephalus, or collection. Vascular: Major flow voids are preserved Skull and upper cervical spine: Normal marrow signal Sinuses/Orbits: Negative IMPRESSION: 1. Limited for staging purposes due to lack of IV contrast. No gross masslike features. 2. Advanced chronic small vessel ischemia. Electronically Signed   By: Jorje Guild M.D.   On: 01/16/2022  12:07     Future Appointments  Date Time Provider [place]  02/17/2022  1:45 PM [facility] [place] [facility] None  02/23/2022  9:15 AM [facility] [place] [facility] None  02/23/2022  9:45 AM Curt Bears, MD [facility] None  02/23/2022 10:30 AM [facility] INFUSION [facility] None  03/03/2022  1:00 PM [facility] [place] [facility] None  03/10/2022  2:00 PM [facility] [place] [facility] None  03/17/2022  9:00 AM [facility] [place] [facility] None  03/17/2022  9:30 AM Heilingoetter, Cassandra L, PA-C [facility] None  03/17/2022 10:30 AM [facility] INFUSION [facility] None  03/24/2022  1:00 PM [facility] [place] None  03/31/2022  1:30 PM [facility] [place] [facility] None  04/07/2022  8:30 AM [facility] [place] [facility] None  04/07/2022  9:00 AM Curt Bears, MD [facility] None  04/07/2022 10:00 AM [facility] INFUSION [facility] None  04/14/2022  1:00 PM [facility] [place] [facility] None  04/21/2022  1:00 PM [facility] [place] [facility] None  04/28/2022 11:00 AM [facility] [place] [facility] None  04/28/2022 11:30 AM Heilingoetter, Cassandra L, PA-C [facility] None  04/28/2022 12:30 PM [facility] INFUSION [facility] None  05/13/2022 10:20 AM Hoyt Koch, MD [facility] None      LOS: 11 days   ADDENDUM: Hematology/Oncology Attending:  I had a face-to-face encounter with the patient today.  I reviewed his record, lab and recommended his care plan.  I agree with the above note.  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  The patient also has pulmonary embolus involving the large right main and multiple peripheral pulmonary emboli and July 2023.  His molecular studies showed no actionable mutation but he has PD-L1 expression of 99%.  The patient started a course of systemic chemotherapy with carboplatin, Alimta and  Keytruda first dose on 02/03/2022.  The patient was admitted to the hospital with septicemia with actinobacter bacteremia and treated with cefepime and azithromycin which was later changed to meropenem.  He had pancytopenia after his admission.  The patient was treated with Granix 480 mcg subcutaneously since 02/11/2022 with no significant improvement in his total white blood count.  This may take several more days to notice improvement.  The patient also has significant thrombocytopenia with platelet count of 7000 today. I would recommend for the patient to continue his current supportive care with the Granix injection on daily basis until his absolute neutrophil count is over 1500.  He will also continue to receive platelet transfusion on as-needed basis to keep his platelets count over 20,000. I had a lengthy discussion with the patient today about his future treatment plans and I explained to him that I would likely consider discontinuing the chemotherapy and just treat him with single agent immunotherapy starting from cycle #2 because of the intolerance. Thank you for taking good care of Mr. Cutler, I will continue to follow-up the patient with you and assist in his management on as-needed basis. Disclaimer: This note was dictated with voice recognition software. Similar sounding words can inadvertently be transcribed and may be missed upon review. Eilleen Kempf, MD

## 2022-02-15 NOTE — Progress Notes (Signed)
IV Team: R chest Port due to be re access. Upon removal of the port needle, site had minimal bleeding. Had to apply pressure before re accessing the port. CDI after port accessed. Bruised above the site with steri strips. No bleeding noted at this time. Will monitor.

## 2022-02-16 DIAGNOSIS — J189 Pneumonia, unspecified organism: Secondary | ICD-10-CM | POA: Diagnosis not present

## 2022-02-16 DIAGNOSIS — R7881 Bacteremia: Secondary | ICD-10-CM | POA: Diagnosis not present

## 2022-02-16 DIAGNOSIS — D61818 Other pancytopenia: Secondary | ICD-10-CM | POA: Diagnosis not present

## 2022-02-16 DIAGNOSIS — C3492 Malignant neoplasm of unspecified part of left bronchus or lung: Secondary | ICD-10-CM | POA: Diagnosis not present

## 2022-02-16 LAB — CBC WITH DIFFERENTIAL/PLATELET
Abs Immature Granulocytes: 0.04 10*3/uL (ref 0.00–0.07)
Basophils Absolute: 0 10*3/uL (ref 0.0–0.1)
Basophils Relative: 1 %
Eosinophils Absolute: 0.1 10*3/uL (ref 0.0–0.5)
Eosinophils Relative: 7 %
HCT: 29.2 % — ABNORMAL LOW (ref 39.0–52.0)
Hemoglobin: 10.2 g/dL — ABNORMAL LOW (ref 13.0–17.0)
Immature Granulocytes: 5 %
Lymphocytes Relative: 62 %
Lymphs Abs: 0.5 10*3/uL — ABNORMAL LOW (ref 0.7–4.0)
MCH: 31.1 pg (ref 26.0–34.0)
MCHC: 34.9 g/dL (ref 30.0–36.0)
MCV: 89 fL (ref 80.0–100.0)
Monocytes Absolute: 0.1 10*3/uL (ref 0.1–1.0)
Monocytes Relative: 12 %
Neutro Abs: 0.1 10*3/uL — CL (ref 1.7–7.7)
Neutrophils Relative %: 13 %
Platelets: 28 10*3/uL — CL (ref 150–400)
RBC: 3.28 MIL/uL — ABNORMAL LOW (ref 4.22–5.81)
RDW: 14.2 % (ref 11.5–15.5)
WBC: 0.7 10*3/uL — CL (ref 4.0–10.5)
nRBC: 0 % (ref 0.0–0.2)

## 2022-02-16 LAB — BPAM PLATELET PHERESIS
Blood Product Expiration Date: 202308222359
Blood Product Expiration Date: 202308242359
ISSUE DATE / TIME: 202308211204
ISSUE DATE / TIME: 202308211455
Unit Type and Rh: 5100
Unit Type and Rh: 7300

## 2022-02-16 LAB — BASIC METABOLIC PANEL
Anion gap: 6 (ref 5–15)
BUN: 16 mg/dL (ref 8–23)
CO2: 25 mmol/L (ref 22–32)
Calcium: 8.7 mg/dL — ABNORMAL LOW (ref 8.9–10.3)
Chloride: 107 mmol/L (ref 98–111)
Creatinine, Ser: 0.98 mg/dL (ref 0.61–1.24)
GFR, Estimated: >60 mL/min (ref >60)
Glucose, Bld: 86 mg/dL (ref 70–99)
Potassium: 3.5 mmol/L (ref 3.5–5.1)
Sodium: 138 mmol/L (ref 135–145)

## 2022-02-16 LAB — MAGNESIUM: Magnesium: 1.5 mg/dL — ABNORMAL LOW (ref 1.7–2.4)

## 2022-02-16 LAB — PREPARE PLATELET PHERESIS
Unit division: 0
Unit division: 0

## 2022-02-16 LAB — GLUCOSE, CAPILLARY
Glucose-Capillary: 76 mg/dL (ref 70–99)
Glucose-Capillary: 100 mg/dL — ABNORMAL HIGH (ref 70–99)
Glucose-Capillary: 93 mg/dL (ref 70–99)
Glucose-Capillary: 162 mg/dL — ABNORMAL HIGH (ref 70–99)

## 2022-02-16 MED ORDER — AMLODIPINE BESYLATE 10 MG PO TABS
10.0000 mg | ORAL_TABLET | Freq: Every day | ORAL | Status: DC
  Administered 2022-02-17 – 2022-02-19 (×3): 10 mg via ORAL
  Filled 2022-02-16 (×3): qty 1

## 2022-02-16 MED ORDER — POTASSIUM CHLORIDE CRYS ER 20 MEQ PO TBCR
40.0000 meq | EXTENDED_RELEASE_TABLET | Freq: Once | ORAL | Status: AC
  Administered 2022-02-16: 40 meq via ORAL
  Filled 2022-02-16: qty 2

## 2022-02-16 MED ORDER — MAGNESIUM SULFATE 4 GM/100ML IV SOLN
4.0000 g | Freq: Once | INTRAVENOUS | Status: AC
Start: 2022-02-16 — End: 2022-02-17
  Administered 2022-02-16: 4 g via INTRAVENOUS
  Filled 2022-02-16: qty 100

## 2022-02-16 NOTE — Progress Notes (Addendum)
Received inbound call from patient's wife [name] advising she did not receive a call from [employer]. This RNCM encouraged [name] to call [employer].  No additional TOC needs at this time.  - 1:30 pm notified MD of HHPT/OT, HHA orders needed for [facility].

## 2022-02-16 NOTE — Progress Notes (Signed)
[facility] PROGRESS NOTE   Drew Harrington LEX:[id number] DOB: Oct 19, 1945 DOA: 02/04/2022  PCP: Hoyt Koch, MD  Brief History/Interval Summary: 76 y.o. male with medical history significant of stage IVB non-small cell lung cancer, adenocarcinoma with large apical LUL mass, with mediastinal, right cervical and bilateral adrenal metastases, PE involving large right main on Lovenox (dx 12/2021), HTN, dementia, CKD 3a who presents with diarrhea and weakness.  Patient just received first session of chemotherapy on 8/9 and then started to have diarrhea.  He slipped in the bathtub while trying to wash out and subsequently was sent to ED.  In the ED, patient had a fever, was tachycardic and tachypneic.  With elevated creatinine.  CT head and cervical spine were negative for injuries.  He was also found to have large left pleural effusion.  His Lovenox was switched to IV heparin in anticipation for thoracentesis.  Patient underwent thoracentesis 8/11 which removed 1.1 L fluid. Hospitalization further complicated by blood culture +Acinectobacter.    Consultants: Phone discussion with ID.  Medical oncology  Procedures: Thoracentesis    Subjective/Interval History: Patient again wondering why he cannot go home yet.  Had a long discussion with him regarding this.  Tried to explain the rationale for keeping him in the hospital.  He denies any complaints.  Occasional cough which is better than before.     Assessment/Plan:  Community-acquired pneumonia/severe sepsis present on admission/chronic respiratory failure with hypoxia Sepsis was present on admission with fever, tachycardia, tachypnea with end-organ damage including acute kidney injury and respiratory failure. Patient treated with antibiotics.  Respiratory status has improved.  Seems to be doing well on room air for the most part.  Requiring oxygen at times.  Acinetobacter bacteremia Found in 1 aerobic bottle on 8/10.  Repeat  cultures from 8/12 negative. Initially on cefepime and azithromycin.  Changed over initially to meropenem and then changed back to cefepime.   Susceptibility report is finally available.  Bacteria was noted to be sensitive to cefepime.   Patient completed 7 days of cefepime on 8/18. We will continue to monitor off of antibiotics for now.  Remains afebrile.  Large left pleural effusion Underwent thoracentesis on 8/11 with removal of 1.1 L of fluid.  Was noted to be exudative. No malignant cells noted on cytology.  Cultures without any growth. Respiratory status is stable.  Pancytopenia including normocytic anemia, leukopenia and thrombocytopenia Most likely due to chemotherapy that he received on 8/9.   He was started on Granix on 8/17.   Continues to have significant neutropenia.  We will wait for improvement in counts. Platelet counts have also been decreasing.  He has received 3 units of platelets so far.  Received 1 unit on 8/20 and 2 units on 8/21.  Medical oncology recommends transfusing for less than 20,000.  We will recheck labs tomorrow.  No evidence of overt bleeding Holding his Lovenox per oncology recommendations.  Acute kidney injury on chronic kidney disease stage IIIa/hypokalemia/hypomagnesemia Renal function noted to be normal.  Continue to supplement potassium and magnesium  Acute metabolic encephalopathy Multifactorial.  Stable.  Occasionally distracted but no confusion as before.  History of PE and DVT Diagnosed with acute PE and right lower extremity DVT in July 2023.   Has been on Lovenox even in the outpatient setting.   Due to severe thrombocytopenia oncology recommends holding Lovenox.   Lower extremity Doppler studies continue to show DVT with some improvement.  Stage IV adenocarcinoma of left lung Followed by  Dr. Julien Nordmann.  On systemic chemotherapy.  Port-A-Cath placed by interventional radiology.  Palliative care was consulted. Last chemotherapy on 8/9.  Does  not appear that he received any marrow stimulating agents during that time.  Diabetes mellitus type 2, controlled HbA1c 6.3.  Continue SSI.  CBGs are reasonably well controlled.  Essential hypertension Patient noted to be on losartan, metoprolol.  Blood pressure remains poorly controlled despite adjustment to his antihypertensives.  Amlodipine was added.  No significant improvement in blood pressure.  Will increase amlodipine to 10 mg daily.  DVT Prophylaxis: Lovenox discontinued for now.  He has active DVT in lower extremities.  Will not use SCDs.  Can use compression stockings. Code Status: DNR Family Communication: Wife being updated on every other day basis Disposition Plan: Anticipate discharge home when his counts start improving.  Status is: Inpatient Remains inpatient appropriate because: Pancytopenia    Medications: Scheduled:  amLODipine  5 mg Oral Daily   Chlorhexidine Gluconate Cloth  6 each Topical Daily   feeding supplement  237 mL Oral BID BM   guaiFENesin  600 mg Oral BID   insulin aspart  0-9 Units Subcutaneous TID WC   losartan  100 mg Oral Daily   metoprolol succinate  100 mg Oral Daily   sodium chloride flush  10-40 mL Intracatheter Q12H   Tbo-filgastrim (GRANIX) SQ  480 mcg Subcutaneous q1800   Continuous:  magnesium sulfate bolus IVPB 4 g (02/16/22 0916)    RNH:AFBXUXYBFXOVA, guaiFENesin, mouth rinse, sodium chloride flush  Antibiotics: Anti-infectives (From admission, onward)    Start     Dose/Rate Route Frequency Ordered Stop   02/11/22 0800  ceFEPIme (MAXIPIME) 2 g in sodium chloride 0.9 % 100 mL IVPB        2 g 200 mL/hr over 30 Minutes Intravenous Every 8 hours 02/11/22 0727 02/12/22 2223   02/08/22 2200  ceFEPIme (MAXIPIME) 2 g in sodium chloride 0.9 % 100 mL IVPB  Status:  Discontinued        2 g 200 mL/hr over 30 Minutes Intravenous Every 12 hours 02/08/22 1317 02/11/22 0727   02/08/22 0930  meropenem (MERREM) 1 g in sodium chloride 0.9 % 100  mL IVPB  Status:  Discontinued        1 g 200 mL/hr over 30 Minutes Intravenous Every 8 hours 02/08/22 0830 02/08/22 1235   02/07/22 1430  meropenem (MERREM) 1 g in sodium chloride 0.9 % 100 mL IVPB  Status:  Discontinued        1 g 200 mL/hr over 30 Minutes Intravenous Every 12 hours 02/07/22 1337 02/08/22 0830   02/05/22 2200  ceFEPIme (MAXIPIME) 2 g in sodium chloride 0.9 % 100 mL IVPB  Status:  Discontinued        2 g 200 mL/hr over 30 Minutes Intravenous Every 12 hours 02/05/22 1738 02/07/22 1337   02/05/22 1800  cefTRIAXone (ROCEPHIN) 2 g in sodium chloride 0.9 % 100 mL IVPB  Status:  Discontinued        2 g 200 mL/hr over 30 Minutes Intravenous Every 24 hours 02/04/22 2026 02/05/22 1738   02/05/22 1800  azithromycin (ZITHROMAX) 500 mg in sodium chloride 0.9 % 250 mL IVPB  Status:  Discontinued        500 mg 250 mL/hr over 60 Minutes Intravenous Every 24 hours 02/04/22 2026 02/07/22 1338   02/04/22 1800  cefTRIAXone (ROCEPHIN) 1 g in sodium chloride 0.9 % 100 mL IVPB  1 g 200 mL/hr over 30 Minutes Intravenous  Once 02/04/22 1757 02/04/22 2020   02/04/22 1800  azithromycin (ZITHROMAX) 500 mg in sodium chloride 0.9 % 250 mL IVPB        500 mg 250 mL/hr over 60 Minutes Intravenous  Once 02/04/22 1757 02/04/22 2146       Objective:  Vital Signs  Vitals:   02/15/22 1520 02/15/22 1749 02/15/22 2112 02/16/22 0508  BP: (!) 166/86 (!) 164/87 (!) 172/88 (!) 167/91  Pulse: 89 88 99 98  Resp: (!) 22 16 (!) 21 (!) 22  Temp: 98.5 F (36.9 C) 98.6 F (37 C) 99.4 F (37.4 C) 98.5 F (36.9 C)  TempSrc: Oral Oral    SpO2: 93% 94% 95% (!) 87%  Weight:      Height:        Intake/Output Summary (Last 24 hours) at 02/16/2022 1023 Last data filed at 02/15/2022 2346 Gross per 24 hour  Intake 1285.83 ml  Output 1050 ml  Net 235.83 ml    Filed Weights   02/04/22 2050 02/08/22 1702  Weight: 78.3 kg 75.8 kg    General appearance: Awake alert.  In no distress.  Mildly  distracted Resp: Normal effort at rest.  Coarse breath sounds bilaterally with few crackles at the bases.  No wheezing or rhonchi. Cardio: S1-S2 is normal regular.  No S3-S4.  No rubs murmurs or bruit GI: Abdomen is soft.  Nontender nondistended.  Bowel sounds are present normal.  No masses organomegaly Extremities: No edema.  Full range of motion of lower extremities. Neurologic:  No focal neurological deficits.     Lab Results:  Data Reviewed: I have personally reviewed following labs and reports of the imaging studies  CBC: Recent Labs  Lab 02/13/22 0532 02/14/22 0516 02/14/22 1842 02/15/22 0359 02/16/22 0321  WBC 0.6* 0.5* 0.6* 0.7* 0.7*  NEUTROABS 0.2* 0.1* 0.1* 0.1* 0.1*  HGB 11.0* 10.9* 10.5* 11.4* 10.2*  HCT 31.6* 32.1* 30.8* 33.1* 29.2*  MCV 90.0 90.7 90.1 90.4 89.0  PLT 23* 9* 14* 7* 28*     Basic Metabolic Panel: Recent Labs  Lab 02/10/22 0455 02/11/22 0315 02/13/22 0532 02/14/22 0516 02/16/22 0321  NA 144 140 137 138 138  K 3.3* 3.6 3.0* 3.7 3.5  CL 110 108 104 107 107  CO2 28 26 26 26 25   GLUCOSE 92 94 94 90 86  BUN 24* 24* 21 20 16   CREATININE 1.10 0.95 0.84 0.71 0.98  CALCIUM 8.4* 8.2* 8.4* 8.5* 8.7*  MG  --  1.8  --  1.6* 1.5*     GFR: Estimated Creatinine Clearance: 64.1 mL/min (by C-G formula based on SCr of 0.98 mg/dL).   CBG: Recent Labs  Lab 02/15/22 0747 02/15/22 1128 02/15/22 1605 02/15/22 2101 02/16/22 0748  GLUCAP 76 126* 95 87 76       Recent Results (from the past 240 hour(s))  Culture, blood (Routine X 2) w Reflex to ID Panel     Status: None   Collection Time: 02/06/22  1:41 PM   Specimen: BLOOD  Result Value Ref Range Status   Specimen Description   Final    BLOOD RIGHT ANTECUBITAL Performed at [facility] [address], [place], [place] [address]    Special Requests   Final    BOTTLES DRAWN AEROBIC AND ANAEROBIC Blood Culture adequate volume Performed at [facility] [address], [place],  [address]    Culture  Final    NO GROWTH 5 DAYS Performed at [facility], [place] [address], [place], [place] [address]    Report Status 02/11/2022 FINAL  Final  Culture, blood (Routine X 2) w Reflex to ID Panel     Status: None   Collection Time: 02/06/22  1:52 PM   Specimen: BLOOD  Result Value Ref Range Status   Specimen Description   Final    BLOOD BLOOD LEFT HAND Performed at [facility] [address] [place], [place] [address]    Special Requests   Final    BOTTLES DRAWN AEROBIC AND ANAEROBIC Blood Culture adequate volume Performed at [facility] [address], [place], [place] [address]    Culture   Final    NO GROWTH 5 DAYS Performed at [facility], [place] [address], [place], [place] [address]    Report Status 02/11/2022 FINAL  Final      Radiology Studies: VAS [place] LOWER EXTREMITY VENOUS (DVT)  Result Date: 02/16/2022  Lower Venous DVT Study Patient Name:  Drew Harrington  Date of Exam:   02/15/2022 Medical Rec #: [id number]          Accession #:    [id number] Date of Birth: Feb 13, 1946           Patient Gender: M Patient Age:   26 years Exam Location:  [facility] Procedure:      VAS [place] LOWER EXTREMITY VENOUS (DVT) Referring Phys: Bonnielee Haff --------------------------------------------------------------------------------  Indications: Follow up exam.  Risk Factors: CA patient on chemotherapy. PE & DVT (01/15/22). Limitations: Lovenox prior to admission. Comparison Study: Previous exam on 01/15/22 was positive for DVT in RLE (femoral                   (distal) PopV, PeroV, soleal) Performing Technologist: Jody Hill RVT, RDMS  Examination Guidelines: A complete evaluation includes B-mode imaging, spectral Doppler, color Doppler, and power Doppler as needed of all accessible portions of each vessel. Bilateral testing is considered an integral part of a complete examination. Limited  examinations for reoccurring indications may be performed as noted. The reflux portion of the exam is performed with the patient in reverse Trendelenburg.  +---------+---------------+---------+-----------+----------+-----------------+ RIGHT    CompressibilityPhasicitySpontaneityPropertiesThrombus Aging    +---------+---------------+---------+-----------+----------+-----------------+ CFV      Full           Yes      Yes                                    +---------+---------------+---------+-----------+----------+-----------------+ SFJ      Full                                                           +---------+---------------+---------+-----------+----------+-----------------+ FV Prox  Full           Yes      Yes                                    +---------+---------------+---------+-----------+----------+-----------------+ FV Mid   Full           Yes  Yes                                    +---------+---------------+---------+-----------+----------+-----------------+ FV DistalFull           Yes      Yes                                    +---------+---------------+---------+-----------+----------+-----------------+ PFV      Full                                                           +---------+---------------+---------+-----------+----------+-----------------+ POP      Partial        Yes      Yes                  Age Indeterminate +---------+---------------+---------+-----------+----------+-----------------+ PTV      Full                                                           +---------+---------------+---------+-----------+----------+-----------------+ PERO     Full                                                           +---------+---------------+---------+-----------+----------+-----------------+ Soleal   None           No       No                   Acute              +---------+---------------+---------+-----------+----------+-----------------+   +----+---------------+---------+-----------+----------+--------------+ LEFTCompressibilityPhasicitySpontaneityPropertiesThrombus Aging +----+---------------+---------+-----------+----------+--------------+ CFV Full                                                        +----+---------------+---------+-----------+----------+--------------+     Summary: RIGHT: - Findings consistent with acute deep vein thrombosis involving the right soleal veins. - Findings consistent with age indeterminate deep vein thrombosis involving the right popliteal vein. - Findings appear improved from previous examination. - No cystic structure found in the popliteal fossa. - Ultrasound characteristics of enlarged lymph nodes are noted in the groin.  LEFT: - No evidence of common femoral vein obstruction. - Ultrasound characteristics of enlarged lymph nodes noted in the groin.  *See table(s) above for measurements and observations. Electronically signed by Orlie Pollen on 02/16/2022 at 4:14:05 AM.    Final        LOS: 12 days   [facility] Pager on [email or web address]  02/16/2022, 10:23 AM

## 2022-02-16 NOTE — Progress Notes (Signed)
PT Cancellation Note  Patient Details Name: Drew Harrington MRN: [id number] DOB: 30-Mar-1946   Cancelled Treatment:     pt in bed sleeping but easily aroused.  Untouched lunch tray in front of him.  Pt declined any activity no other reason than "tired".  Pt has been evaluated with rec to return home with spouse.   Rica Koyanagi  PTA Acute  Rehabilitation Services Office M-F          [phone] Weekend pager [phone]

## 2022-02-17 ENCOUNTER — Inpatient Hospital Stay: Payer: Medicare Other

## 2022-02-17 ENCOUNTER — Telehealth: Payer: Self-pay

## 2022-02-17 DIAGNOSIS — J189 Pneumonia, unspecified organism: Secondary | ICD-10-CM | POA: Diagnosis not present

## 2022-02-17 DIAGNOSIS — N179 Acute kidney failure, unspecified: Secondary | ICD-10-CM | POA: Diagnosis not present

## 2022-02-17 DIAGNOSIS — A4159 Other Gram-negative sepsis: Secondary | ICD-10-CM

## 2022-02-17 DIAGNOSIS — E876 Hypokalemia: Secondary | ICD-10-CM

## 2022-02-17 DIAGNOSIS — J9611 Chronic respiratory failure with hypoxia: Secondary | ICD-10-CM

## 2022-02-17 DIAGNOSIS — G9341 Metabolic encephalopathy: Secondary | ICD-10-CM | POA: Diagnosis not present

## 2022-02-17 DIAGNOSIS — A419 Sepsis, unspecified organism: Secondary | ICD-10-CM | POA: Diagnosis not present

## 2022-02-17 DIAGNOSIS — D61818 Other pancytopenia: Secondary | ICD-10-CM

## 2022-02-17 LAB — BASIC METABOLIC PANEL
Anion gap: 6 (ref 5–15)
BUN: 23 mg/dL (ref 8–23)
CO2: 25 mmol/L (ref 22–32)
Calcium: 8.7 mg/dL — ABNORMAL LOW (ref 8.9–10.3)
Chloride: 110 mmol/L (ref 98–111)
Creatinine, Ser: 1.26 mg/dL — ABNORMAL HIGH (ref 0.61–1.24)
GFR, Estimated: 59 mL/min — ABNORMAL LOW (ref >60)
Glucose, Bld: 119 mg/dL — ABNORMAL HIGH (ref 70–99)
Potassium: 3.6 mmol/L (ref 3.5–5.1)
Sodium: 141 mmol/L (ref 135–145)

## 2022-02-17 LAB — CBC WITH DIFFERENTIAL/PLATELET
Abs Immature Granulocytes: 0.26 10*3/uL — ABNORMAL HIGH (ref 0.00–0.07)
Basophils Absolute: 0 10*3/uL (ref 0.0–0.1)
Basophils Relative: 1 %
Eosinophils Absolute: 0.1 10*3/uL (ref 0.0–0.5)
Eosinophils Relative: 2 %
HCT: 30.2 % — ABNORMAL LOW (ref 39.0–52.0)
Hemoglobin: 10.2 g/dL — ABNORMAL LOW (ref 13.0–17.0)
Immature Granulocytes: 9 %
Lymphocytes Relative: 29 %
Lymphs Abs: 0.8 10*3/uL (ref 0.7–4.0)
MCH: 30.3 pg (ref 26.0–34.0)
MCHC: 33.8 g/dL (ref 30.0–36.0)
MCV: 89.6 fL (ref 80.0–100.0)
Monocytes Absolute: 0.2 10*3/uL (ref 0.1–1.0)
Monocytes Relative: 5 %
Neutro Abs: 1.5 10*3/uL — ABNORMAL LOW (ref 1.7–7.7)
Neutrophils Relative %: 54 %
Platelets: 22 10*3/uL — CL (ref 150–400)
RBC: 3.37 MIL/uL — ABNORMAL LOW (ref 4.22–5.81)
RDW: 14.5 % (ref 11.5–15.5)
WBC: 2.9 10*3/uL — ABNORMAL LOW (ref 4.0–10.5)
nRBC: 0 % (ref 0.0–0.2)

## 2022-02-17 LAB — GLUCOSE, CAPILLARY
Glucose-Capillary: 109 mg/dL — ABNORMAL HIGH (ref 70–99)
Glucose-Capillary: 144 mg/dL — ABNORMAL HIGH (ref 70–99)
Glucose-Capillary: 107 mg/dL — ABNORMAL HIGH (ref 70–99)
Glucose-Capillary: 120 mg/dL — ABNORMAL HIGH (ref 70–99)

## 2022-02-17 LAB — MAGNESIUM: Magnesium: 2.4 mg/dL (ref 1.7–2.4)

## 2022-02-17 NOTE — Assessment & Plan Note (Signed)
Due to chemo.  [place] rising but only 1.5 today, but family have changed goals of care, and wish to discharge with Hospice - Stop Granix - Stop lab work - [employer]

## 2022-02-17 NOTE — Progress Notes (Addendum)
Progress Note   Patient: Drew Harrington HYW:[id number] DOB: 1946-04-09 DOA: 02/04/2022     13 DOS: the patient was seen and examined on 02/17/2022 at 4:05PM      Brief hospital course: Drew Harrington is a 76 y.o. 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     Assessment and Plan: * Severe sepsis ([place]) Presented with fever, tachycardia, tachypnea, and encephalopathy.  Found to have pneumonia, pleural effusion and Acinetobacter bacteremia.  Completed course of antibiotics and now afebrile.  Hypomagnesemia Supplemented and rsolved  Pancytopenia ([place]) Due to chemo.  [place] rising but only 1.5 today, but family have changed goals of care, and wish to discharge with Hospice - Stop Granix - Stop lab work - Consult Hospice  Hypokalemia Supplemented and resolved  Septicemia due to Acinetobacter species ([place]) See above  Chronic respiratory failure with hypoxia ([place])    Pressure injury of skin To sacrum stage 2, not present on admission     Acute metabolic encephalopathy At baseline has mild cognitive impairment, but oriented to self, situation, date, place.  On arrival was confused more than baseline.  This has waxed and waned, as the patient recovers from his sepsis, and may be entering a terminal decline.  Acute kidney injury superimposed on chronic kidney disease ([place]) Creatinine 1.96 on admission, now resolved to baseline.     CAP (community acquired pneumonia) Completed antibiotics, weaned back to baesline O2.   History of pulmonary embolus (PE) - Stop Lovenox given thrombocytopenia, plans for discharge with Hospice  Pleural effusion, left S/p thoracentesis.  Adenocarcinoma of left lung, stage 4 ([place]) Discussed with wife by phone.  Patient has expressed to her that he does not wish to  do more chemo.  Given his limited functional improvement in the last week and his declining alertness and oral intake, her intuition seems correct that he is entering a terminal decline.   - Consult Hospice - Stop chemo, stop Granix  Stage 3a chronic kidney disease (CKD) ([place]) Cr resolved to baseline.  DM2 (diabetes mellitus, type 2) (H[facility]) A1c 7.3%, adequately controlled here - Continue SS corrections  HTN (hypertension) BP normal - Continue amlodipine, losartan, Metoporlol          Subjective: Paitent with poor oral intake today.  Denies complaints to me but is somnolent and poorly responsive.  No fever, no respiratory distress, no vomiting.     Physical Exam: Vitals:   02/16/22 1753 02/16/22 2047 02/17/22 0525 02/17/22 1241  BP: (!) 148/86 126/77 (!) 163/79 102/73  Pulse: 89 99 94 99  Resp: (!) 21 17 15 18   Temp: 98.9 F (37.2 C) 99.4 F (37.4 C) 98.5 F (36.9 C) 98.8 F (37.1 C)  TempSrc: Oral     SpO2: 95% 91% 90%   Weight:      Height:       Thin elderly adult male, lying in bed, somnolent RRR, no murmurs appreciated by me, no peripheral edema Respiratory rate shallow, lung sounds diminished, I do not appreciate rales or wheezes Abdomen soft, no grimace to palpation, Attention diminished, affect blunted, judgment insight appear impaired, somnolent, does not follow commands    Data Reviewed: Discussed with palliative care basic metabolicPanel notable for creatinine 1.2, white blood cell count up to 2.9, hemoglobin 10, no change, platelets 22 K.  Discussed with wife by  phone, we reviewed goals of care, she reported that he was clear to her that he was not wanting to continue chemotherapy at this time, and she would like him to come home.  We discussed stopping life-prolonging treatments, pursuing comfort with day-to-day monitoring of his status for symptomatic management.          Author: Edwin Dada, MD 02/17/2022 8:59 PM  For on call  review [email or web address].

## 2022-02-17 NOTE — Assessment & Plan Note (Signed)
Cr resolved to baseline.

## 2022-02-17 NOTE — Assessment & Plan Note (Signed)
Supplemented and rsolved

## 2022-02-17 NOTE — Assessment & Plan Note (Signed)
-   Supplemented and resolved 

## 2022-02-17 NOTE — Assessment & Plan Note (Signed)
Presented with fever, tachycardia, tachypnea, and encephalopathy.  Found to have pneumonia, pleural effusion and Acinetobacter bacteremia.  Completed course of antibiotics and now afebrile.

## 2022-02-17 NOTE — Progress Notes (Addendum)
Occupational Therapy Treatment Patient Details Name: Drew Harrington MRN: [id number] DOB: 16-Jul-1945 Today's Date: 02/17/2022   History of present illness Pt is 76 yo male who presents with diarrhea  and weakness. PMH includes stage IVB non-small cell lung cancer, adenocarcinoma with large apical LUL mass, with mediastinal, right cervical and bilateral adrenal metastases, PE involving large right main on Lovenox (dx 12/2021), HTN, dementia, CKD 3a. He was also found to have large left pleural effusion, s/p left thoracentesis on 8/11.   OT comments  Treatment focused on improving patient's independence with Adls and working on OOB activity. Patient required min assist to transfer to edge of bed exhibiting lack of initiation and difficulty motor planning. Once at edge of bed patient min guard to stand and ambulate with RW. He needed verbal cues for functional tasks - to initiate and to sequence. He can follow commands well. He exhibits minimal verbalizations and none sporadic. Recommend HH OT at discharge and near 24/7 supervision initially.  Patient found on 2.5 L [place]. Therapist removed and maintained at 92%. In bathroom on RA patient 95%. On returned to recliner patient 91-92%. Rn informed. Left on RA.    Recommendations for follow up therapy are one component of a multi-disciplinary discharge planning process, led by the attending physician.  Recommendations may be updated based on patient status, additional functional criteria and insurance authorization.    Follow Up Recommendations  Home health OT    Assistance Recommended at Discharge Frequent or constant Supervision/Assistance  Patient can return home with the following  A little help with walking and/or transfers;A little help with bathing/dressing/bathroom;Direct supervision/assist for medications management;Direct supervision/assist for financial management;Assist for transportation   Equipment Recommendations  None recommended by OT     Recommendations for Other Services      Precautions / Restrictions Precautions Precautions: Fall Precaution Comments: monitor O2 Restrictions Weight Bearing Restrictions: No       Mobility Bed Mobility Overal bed mobility: Needs Assistance Bed Mobility: Supine to Sit     Supine to sit: Min assist     General bed mobility comments: Min assist for LEs to transfer to edge of bed - more to guide and initiate transfer.    Transfers Overall transfer level: Needs assistance Equipment used: Rolling walker (2 wheels) Transfers: Sit to/from Stand Sit to Stand: Min guard Stand pivot transfers: Min guard         General transfer comment: min guard to ambulate in room with walker. verbal cues for each task and hand placement.     Balance Overall balance assessment: Mild deficits observed, not formally tested                                         ADL either performed or assessed with clinical judgement   ADL Overall ADL's : Needs assistance/impaired     Grooming: Set up;Sitting;Wash/dry face                   Toilet Transfer: Min guard;Regular Toilet;BSC/3in1;Rolling walker (2 wheels);Grab bars Toilet Transfer Details (indicate cue type and reason): verbal cues for hand placemements Toileting- Clothing Manipulation and Hygiene: Supervision/safety;Sit to/from stand Toileting - Clothing Manipulation Details (indicate cue type and reason): verbal cues to clean himself and set up            Extremity/Trunk Assessment Upper Extremity Assessment Upper Extremity Assessment: Overall [facility] for tasks  assessed   Lower Extremity Assessment Lower Extremity Assessment: Defer to PT evaluation   Cervical / Trunk Assessment Cervical / Trunk Assessment: Normal    Vision   Vision Assessment?: No apparent visual deficits   Perception     Praxis      Cognition Arousal/Alertness: Awake/alert Behavior During Therapy: WFL for tasks  assessed/performed Overall Cognitive Status: No family/caregiver present to determine baseline cognitive functioning                                 General Comments: Decreased initiation. Limited verbalizations without any sporadic conversation. Needs verbal cues to motor plan and perform each task.        Exercises      Shoulder Instructions       General Comments      Pertinent Vitals/ Pain       Pain Assessment Pain Assessment: No/denies pain  Home Living                                          Prior Functioning/Environment              Frequency  Min 2X/week        Progress Toward Goals  OT Goals(current goals can now be found in the care plan section)  Progress towards OT goals: Progressing toward goals  Acute Rehab OT Goals Patient Stated Goal: none stated OT Goal Formulation: With patient Time For Goal Achievement: 02/20/22 Potential to Achieve Goals: Good  Plan Discharge plan needs to be updated    Co-evaluation                 AM-PAC OT "6 Clicks" Daily Activity     Outcome Measure   Help from another person eating meals?: None Help from another person taking care of personal grooming?: A Little Help from another person toileting, which includes using toliet, bedpan, or urinal?: A Little Help from another person bathing (including washing, rinsing, drying)?: A Little Help from another person to put on and taking off regular upper body clothing?: A Little Help from another person to put on and taking off regular lower body clothing?: A Little 6 Click Score: 19    End of Session Equipment Utilized During Treatment: Oxygen;Rolling walker (2 wheels)  OT Visit Diagnosis: Unsteadiness on feet (R26.81);Muscle weakness (generalized) (M62.81)   Activity Tolerance Patient tolerated treatment well   Patient Left in chair;with call bell/phone within reach;with chair alarm set   Nurse Communication Mobility  status        Time: 1601-0932 OT Time Calculation (min): 25 min  Charges: OT General Charges $OT Visit: 1 Visit OT Treatments $Self Care/Home Management : 23-37 mins  Drew Harrington, OTR/L [place]  Office [phone] Pager: [place] 02/17/2022, 11:21 AM

## 2022-02-17 NOTE — Telephone Encounter (Signed)
Pt wife called in to say that she just dropped FMLA pw off for Dr. [name] and she needs it back as soon as possible as her husband is in the [facility] and they think he is dying.   Please advise   Call pt when the forms have been completed

## 2022-02-17 NOTE — Assessment & Plan Note (Signed)
See above

## 2022-02-17 NOTE — TOC Transition Note (Signed)
Transition of Care [facility]) - CM/SW Discharge Note   Patient Details  Name: Drew Harrington MRN: [id number] Date of Birth: 15-Jan-1946  [facility]) CM/SW Contact:  Roseanne Kaufman, RN Phone Number: 02/17/2022, 10:33 AM   Clinical Narrative:   Per Randall Hiss with Palliative care an outpatient palliative referral has been made to the [place] for symptom management for cancer with the embedded coordinator [facility]. No additional TOC needs at this time. If any other TOC needs arise please send new consult.     Final next level of care: [place] Barriers to Discharge: No Barriers Identified   Patient Goals and CMS Choice Patient states their goals for this hospitalization and ongoing recovery are:: home with home health services and DME CMS Medicare.gov Compare Post Acute Care list provided to:: Patient Represenative (must comment) (wife) Choice offered to / list presented to : Spouse Autumn Messing)  Discharge Placement                       Discharge Plan and Services In-house Referral: NA Discharge Planning Services: CM Consult Post Acute Care Choice: Durable Medical Equipment, Home Health          DME Arranged: Bedside commode, Hospital bed, Overbed table, Shower stool DME Agency: [employer] Date DME Agency Contacted: 02/09/22   Representative spoke with at DME Agency: Brenton Grills HH Arranged: PT, Nurse's Aide, OT, Social Work [employer] Agency: [facility] [place] Date [place]: 02/08/22   Representative spoke with at [place]: [place] ([place]) Interventions     Readmission Risk Interventions    02/11/2022    2:01 PM 02/10/2022   10:02 AM  Readmission Risk Prevention Plan  Transportation Screening Complete   PCP or Specialist Appt within 3-5 Days Complete Complete  HRI or [place] Complete Complete  Social Work Consult for [place] Planning/Counseling Complete Complete  Palliative  Care Screening  Not Applicable  Medication Review Press photographer) Complete Complete

## 2022-02-17 NOTE — Hospital Course (Signed)
Drew Harrington is a 76 y.o. 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

## 2022-02-17 NOTE — Assessment & Plan Note (Addendum)
To sacrum stage 2, not present on admission

## 2022-02-17 NOTE — Progress Notes (Signed)
Pt poor oral intake today.  Refusing to eat meals.    Will continue to monitor

## 2022-02-18 DIAGNOSIS — Z7189 Other specified counseling: Secondary | ICD-10-CM | POA: Diagnosis not present

## 2022-02-18 DIAGNOSIS — Z515 Encounter for palliative care: Secondary | ICD-10-CM | POA: Diagnosis not present

## 2022-02-18 DIAGNOSIS — Z66 Do not resuscitate: Secondary | ICD-10-CM | POA: Diagnosis not present

## 2022-02-18 DIAGNOSIS — A419 Sepsis, unspecified organism: Secondary | ICD-10-CM | POA: Diagnosis not present

## 2022-02-18 DIAGNOSIS — Z0289 Encounter for other administrative examinations: Secondary | ICD-10-CM

## 2022-02-18 DIAGNOSIS — N1831 Chronic kidney disease, stage 3a: Secondary | ICD-10-CM

## 2022-02-18 DIAGNOSIS — A4159 Other Gram-negative sepsis: Principal | ICD-10-CM

## 2022-02-18 DIAGNOSIS — J90 Pleural effusion, not elsewhere classified: Secondary | ICD-10-CM | POA: Diagnosis not present

## 2022-02-18 DIAGNOSIS — R6520 Severe sepsis without septic shock: Secondary | ICD-10-CM | POA: Diagnosis not present

## 2022-02-18 LAB — MISC LABCORP TEST (SEND OUT): Labcorp test code: 9985

## 2022-02-18 MED ORDER — HYDROCODONE BIT-HOMATROP MBR 5-1.5 MG/5ML PO SOLN
5.0000 mL | ORAL | Status: DC | PRN
  Administered 2022-02-18: 5 mL via ORAL
  Filled 2022-02-18: qty 473

## 2022-02-18 MED ORDER — LORAZEPAM 1 MG PO TABS: 1.0000 mg | ORAL_TABLET | Freq: Three times a day (TID) | ORAL | 0 refills | Status: DC | PRN

## 2022-02-18 MED ORDER — ENSURE ENLIVE PO LIQD: 237.0000 mL | Freq: Two times a day (BID) | ORAL | 12 refills | Status: DC

## 2022-02-18 MED ORDER — HYDROCODONE BIT-HOMATROP MBR 5-1.5 MG/5ML PO SOLN: 5.0000 mL | ORAL | 0 refills | Status: DC | PRN

## 2022-02-18 MED ORDER — GUAIFENESIN ER 600 MG PO TB12: 600.0000 mg | ORAL_TABLET | Freq: Two times a day (BID) | ORAL | 0 refills | Status: DC

## 2022-02-18 MED ORDER — OXYCODONE HCL 5 MG PO TABS: 5.0000 mg | ORAL_TABLET | Freq: Three times a day (TID) | ORAL | 0 refills | Status: DC | PRN

## 2022-02-18 MED ORDER — ALBUTEROL SULFATE HFA 108 (90 BASE) MCG/ACT IN AERS: 2.0000 | INHALATION_SPRAY | Freq: Four times a day (QID) | RESPIRATORY_TRACT | 2 refills | Status: DC | PRN

## 2022-02-18 NOTE — Progress Notes (Signed)
Manufacturing engineer [facility]) Hospital Liaison Note  Referral received for patient/family interest in hospice at home. [place] liaison spoke with patient's son Drew Harrington to confirm interest. Interest confirmed.   Hospice eligibility pending.   Plan is to discharge home today via private vehicle.   DME in the home: oxygen  DME needs: hospital bed, shower chair, BSC, over the bed tray   Please send patient home with comfort prescriptions/medications at discharge.   Please call with any questions or concerns. Thank you  Roselee Nova, [place] Hospital Liaison [phone]

## 2022-02-18 NOTE — Telephone Encounter (Signed)
I do not have any forms in my box. Were these received at time of note or are we awaiting FMLA

## 2022-02-18 NOTE — Progress Notes (Signed)
Physical Therapy Treatment Patient Details Name: Drew Harrington MRN: [id number] DOB: 05-18-1946 Today's Date: 02/18/2022   History of Present Illness Pt is 76 yo male who presents with diarrhea  and weakness. PMH includes stage IVB non-small cell lung cancer, adenocarcinoma with large apical LUL mass, with mediastinal, right cervical and bilateral adrenal metastases, PE involving large right main on Lovenox (dx 12/2021), HTN, dementia, CKD 3a. He was also found to have large left pleural effusion, s/p left thoracentesis on 8/11.    PT Comments    Pt ambulated 6' with RW, no loss of balance, SaO2 90% on room air, distance limited by fatigue. Pt does require some verbal cues and increased time to follow commands.    Recommendations for follow up therapy are one component of a multi-disciplinary discharge planning process, led by the attending physician.  Recommendations may be updated based on patient status, additional functional criteria and insurance authorization.  Follow Up Recommendations  Home health PT     Assistance Recommended at Discharge Intermittent Supervision/Assistance  Patient can return home with the following A little help with walking and/or transfers;A little help with bathing/dressing/bathroom;Assistance with cooking/housework;Assist for transportation;Help with stairs or ramp for entrance;Direct supervision/assist for financial management   Equipment Recommendations  None recommended by PT    Recommendations for Other Services       Precautions / Restrictions Precautions Precautions: Fall Precaution Comments: monitor O2 Restrictions Weight Bearing Restrictions: No     Mobility  Bed Mobility Overal bed mobility: Modified Independent Bed Mobility: Supine to Sit     Supine to sit: Modified independent (Device/Increase time), HOB elevated     General bed mobility comments: used bedrail    Transfers Overall transfer level: Needs assistance Equipment  used: Rolling walker (2 wheels) Transfers: Sit to/from Stand Sit to Stand: Min assist, From elevated surface           General transfer comment: min A to power up    Ambulation/Gait Ambulation/Gait assistance: Min guard Gait Distance (Feet): 55 Feet Assistive device: Rolling walker (2 wheels) Gait Pattern/deviations: Step-through pattern, Decreased stride length Gait velocity: very slow     General Gait Details: steady with RW, no loss of balance, SaO2 90% on room air walking, HR 113, decreased velocity   Stairs             Wheelchair Mobility    Modified Rankin (Stroke Patients Only)       Balance Overall balance assessment: Mild deficits observed, not formally tested                                          Cognition Arousal/Alertness: Awake/alert Behavior During Therapy: WFL for tasks assessed/performed Overall Cognitive Status: No family/caregiver present to determine baseline cognitive functioning                                 General Comments: Decreased initiation. Limited verbalizations. Needs verbal cues to motor plan and perform each task.        Exercises      General Comments        Pertinent Vitals/Pain Pain Assessment Pain Assessment: No/denies pain    Home Living                          Prior  Function            PT Goals (current goals can now be found in the care plan section) Acute Rehab PT Goals Patient Stated Goal: home PT Goal Formulation: With patient/family Time For Goal Achievement: 03/02/22 Potential to Achieve Goals: Fair Progress towards PT goals: Progressing toward goals    Frequency    Min 3X/week      PT Plan Current plan remains appropriate    Co-evaluation              AM-PAC PT "6 Clicks" Mobility   Outcome Measure  Help needed turning from your back to your side while in a flat bed without using bedrails?: A Little Help needed moving from  lying on your back to sitting on the side of a flat bed without using bedrails?: A Little Help needed moving to and from a bed to a chair (including a wheelchair)?: A Little Help needed standing up from a chair using your arms (e.g., wheelchair or bedside chair)?: A Little Help needed to walk in hospital room?: A Little Help needed climbing 3-5 steps with a railing? : A Little 6 Click Score: 18    End of Session Equipment Utilized During Treatment: Gait belt Activity Tolerance: Patient tolerated treatment well Patient left: in chair;with chair alarm set;with call bell/phone within reach Nurse Communication: Mobility status PT Visit Diagnosis: Unsteadiness on feet (R26.81);Muscle weakness (generalized) (M62.81)     Time: 1445-1500 PT Time Calculation (min) (ACUTE ONLY): 15 min  Charges:  $Gait Training: 8-22 mins                     Blondell Reveal Kistler PT 02/18/2022  Acute Rehabilitation Services  Office [phone]

## 2022-02-18 NOTE — Plan of Care (Signed)
  Problem: Coping: Goal: Level of anxiety will decrease Outcome: Progressing   Problem: Pain Managment: Goal: General experience of comfort will improve Outcome: Progressing   Problem: Safety: Goal: Ability to remain free from injury will improve Outcome: Progressing   Problem: Skin Integrity: Goal: Risk for impaired skin integrity will decrease Outcome: Progressing   

## 2022-02-18 NOTE — Telephone Encounter (Signed)
Spoke with wife today. Forms billed and placed up front for pick up.  She will get them today.

## 2022-02-18 NOTE — TOC Transition Note (Signed)
Transition of Care [facility]) - CM/SW Discharge Note   Patient Details  Name: Drew Harrington MRN: [id number] Date of Birth: Aug 24, 1945  Transition of Care [facility]) CM/SW Contact:  Roseanne Kaufman, RN Phone Number: 02/18/2022, 3:41 PM   Clinical Narrative:  Patient has been accepted to [facility] for home hospice, per [facility] with [facility]. No additional TOC needs at this time.     Final next level of care: Home w Hospice Care Barriers to Discharge: No Barriers Identified   Patient Goals and CMS Choice Patient states their goals for this hospitalization and ongoing recovery are:: home with hospice CMS Medicare.gov Compare Post Acute Care list provided to:: Patient Represenative (must comment) [name] (wife)) Choice offered to / list presented to : Spouse Autumn Messing)  Discharge Placement                       Discharge Plan and Services In-house Referral: NA Discharge Planning Services: CM Consult Post Acute Care Choice: Durable Medical Equipment, Home Health          DME Arranged: Bedside commode, Hospital bed, Overbed table, Shower stool DME Agency: [employer] Date DME Agency Contacted: 02/09/22   Representative spoke with at DME Agency: Brenton Grills HH Arranged: PT, Nurse's Aide, OT, Social Work [employer] Agency: [facility] [place] Date [place]: 02/08/22   Representative spoke with at [place]: [place] ([place]) Interventions     Readmission Risk Interventions    02/11/2022    2:01 PM 02/10/2022   10:02 AM  Readmission Risk Prevention Plan  Transportation Screening Complete   PCP or Specialist Appt within 3-5 Days Complete Complete  HRI or [place] Complete Complete  Social Work Consult for [place] Planning/Counseling Complete Complete  Palliative Care Screening  Not Applicable  Medication Review Press photographer) Complete Complete

## 2022-02-18 NOTE — Discharge Summary (Addendum)
Physician Discharge Summary   Patient: Drew Harrington MRN: [id number] DOB: 1946/01/30  Admit date:     02/04/2022  Discharge date: 02/19/22  Discharge Physician: Edwin Dada   PCP: Hoyt Koch, MD     Recommendations at discharge:  Follow up with Hospice     Discharge Diagnoses: Principal Problem:   Severe sepsis ([place]) Active Problems:   HTN (hypertension)   DM2 (diabetes mellitus, type 2) (HCC)   Stage 3a chronic kidney disease (CKD) (HCC)   Adenocarcinoma of left lung, stage 4 (HCC)   Pleural effusion, left   History of pulmonary embolus (PE)   CAP (community acquired pneumonia)   Acute kidney injury superimposed on chronic kidney disease ([place])   Acute metabolic encephalopathy   Pressure injury of skin   Chronic respiratory failure with hypoxia ([place])   Septicemia due to Acinetobacter species ([place])   Hypokalemia   Pancytopenia ([place])   Hypomagnesemia      Hospital Course: Mr. Heindl is a 76 y.o. 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      * Severe sepsis ([place]) Bacteremia due to Acinetobacter species  CAP (community acquired pneumonia) Pleural effusion, left Treated with full course of antibiotics.  Now afebrile, HR and RR normal, SPO2 at baseline.      Pancytopenia ([place]) Treated with Granix and resolving at the time of the decision to transition to Hospice.      Adenocarcinoma of left lung, stage 4 [facility]) Discussed with patient, wife, Palliative Care.  Patient expressed separately to wife and to myself/palliative care that he did not wish to continue chemo.  They requested Hospice support at home, which was arranged prior to discharge.    Pressure injury of skin To sacrum stage 2, not present on admission     History of pulmonary embolus  (PE) - Stop Lovenox given thrombocytopenia, plans for discharge with Hospice             Consultants:  Oncology, Palliative Care  Procedures performed:  - Thoracentesis   Disposition: Hospice care   DISCHARGE MEDICATION: Allergies as of 02/19/2022       Reactions   Sildenafil Other (See Comments)   Caused a headache        Medication List     STOP taking these medications    enoxaparin 80 MG/0.8ML injection Commonly known as: LOVENOX   folic acid 1 MG tablet Commonly known as: FOLVITE   lidocaine-prilocaine cream Commonly known as: EMLA   metFORMIN 1000 MG tablet Commonly known as: GLUCOPHAGE   potassium chloride SA 20 MEQ tablet Commonly known as: KLOR-CON M   simvastatin 20 MG tablet Commonly known as: ZOCOR       TAKE these medications    albuterol 108 (90 Base) MCG/ACT inhaler Commonly known as: VENTOLIN HFA Inhale 2 puffs into the lungs every 6 (six) hours as needed for wheezing or shortness of breath.   feeding supplement Liqd Take 237 mLs by mouth 2 (two) times daily between meals.   guaiFENesin 600 MG 12 hr tablet Commonly known as: MUCINEX Take 1 tablet (600 mg total) by mouth 2 (two) times daily.   HYDROcodone bit-homatropine 5-1.5 MG/5ML syrup Commonly known as: HYCODAN Take 5 mLs by mouth every 4 (four) hours as needed for cough.   LORazepam 1 MG tablet Commonly known  as: Ativan Take 1 tablet (1 mg total) by mouth every 8 (eight) hours as needed for anxiety.   losartan-hydrochlorothiazide 100-25 MG tablet Commonly known as: HYZAAR TAKE 1 TABLET BY MOUTH DAILY   metoprolol succinate 25 MG 24 hr tablet Commonly known as: TOPROL-XL Take 1 tablet by mouth in the morning and at bedtime. What changed: when to take this   oxyCODONE 5 MG immediate release tablet Commonly known as: Roxicodone Take 1 tablet (5 mg total) by mouth every 8 (eight) hours as needed.   primidone 250 MG tablet Commonly known as: MYSOLINE Take 500  mg by mouth in the morning and at bedtime.   prochlorperazine 10 MG tablet Commonly known as: COMPAZINE Take 1 tablet (10 mg total) by mouth every 6 (six) hours as needed for nausea or vomiting.               Durable Medical Equipment  (From admission, onward)           Start     Ordered   02/09/22 1639  For home use only DME Hospital bed  Once       Question Answer Comment  Length of Need Lifetime   Patient has (list medical condition): sepsis   The above medical condition requires: Patient requires the ability to reposition frequently   Bed type Semi-electric      02/09/22 1640   02/09/22 1637  For home use only DME Air overlay mattress  Once       Comments: Therapeutic mattress   02/09/22 1638   02/07/22 0719  For home use only DME Other see comment  Once       Comments: bed tray  Question:  Length of Need  Answer:  Lifetime   02/07/22 0718   02/05/22 1445  For home use only DME Shower stool  Once        02/05/22 1446   02/05/22 1445  For home use only DME Bedside commode  Once       Question:  Patient needs a bedside commode to treat with the following condition  Answer:  Gait difficulty   02/05/22 [place] Follow up.   Specialty: [place] Why: A representative with [facility] will contact you within 24-48 hours after your discharge from the hospital. Contact information: [place] [address] [place] Follow up.   Why: A representative with [employer] will continue to coordinate DME supplies. Contact information: [address] [place] [address] [phone]                 Discharge Instructions     Discharge instructions   Complete by: As directed    From Dr. Loleta Books: You were admitted for sepsis from low blood counts from the chemo.  Your counts have come  up.  Sometimes, patients in your situation have pain, anxiety, trouble breathing, or cough.  If you have pain: Take oxycodone 5 mg  You may take this up to every 4 hours.  If you are taking it that frequently, discuss with Hospice  If you have anxiety:  Take lorazepam/Ativan 1 mg tablet You may take this up to every 8 hours  If you have cough: Take hycodan 5 mL This is an opiate  cough syrup, so be careful taking it at the same time as oxycodone  If you have trouble breathing, take hycodan and albuterol inhaler and call Hospice.   Increase activity slowly   Complete by: As directed    No wound care   Complete by: As directed        Discharge Exam: Filed Weights   02/04/22 2050 02/08/22 1702  Weight: 78.3 kg 75.8 kg    General: Pt is alert, awake, not in acute distress, appears weak and tired Cardiovascular: RRR, nl S1-S2, no murmurs appreciated.   No LE edema.   Respiratory: Normal respiratory rate and rhythm.  CTAB without rales or wheezes. Abdominal: Abdomen soft and non-tender.  No distension or HSM.   Neuro/Psych: Strength symmetric in upper and lower extremities.  Judgment and insight appear at baseline.   Condition at discharge: stable  The results of significant diagnostics from this hospitalization (including imaging, microbiology, ancillary and laboratory) are listed below for reference.   Imaging Studies: VAS [place] LOWER EXTREMITY VENOUS (DVT)  Result Date: 02/16/2022  Lower Venous DVT Study Patient Name:  RONTE PARKER  Date of Exam:   02/15/2022 Medical Rec #: [id number]          Accession #:    [id number] Date of Birth: [birth year]           Patient Gender: M Patient Age:   44 years Exam [facility]:  [facility] Procedure:      VAS [place] LOWER EXTREMITY VENOUS (DVT) Referring Phys: Bonnielee Haff --------------------------------------------------------------------------------  Indications: Follow up exam.  Risk Factors: CA patient on chemotherapy. PE & DVT  (01/15/22). Limitations: Lovenox prior to admission. Comparison Study: Previous exam on 01/15/22 was positive for DVT in RLE (femoral                   (distal) PopV, PeroV, soleal) Performing Technologist: Jody Hill RVT, RDMS  Examination Guidelines: A complete evaluation includes B-mode imaging, spectral Doppler, color Doppler, and power Doppler as needed of all accessible portions of each vessel. Bilateral testing is considered an integral part of a complete examination. Limited examinations for reoccurring indications may be performed as noted. The reflux portion of the exam is performed with the patient in reverse Trendelenburg.  +---------+---------------+---------+-----------+----------+-----------------+ RIGHT    CompressibilityPhasicitySpontaneityPropertiesThrombus Aging    +---------+---------------+---------+-----------+----------+-----------------+ CFV      Full           Yes      Yes                                    +---------+---------------+---------+-----------+----------+-----------------+ SFJ      Full                                                           +---------+---------------+---------+-----------+----------+-----------------+ FV Prox  Full           Yes      Yes                                    +---------+---------------+---------+-----------+----------+-----------------+ FV Mid   Full           Yes  Yes                                    +---------+---------------+---------+-----------+----------+-----------------+ FV DistalFull           Yes      Yes                                    +---------+---------------+---------+-----------+----------+-----------------+ PFV      Full                                                           +---------+---------------+---------+-----------+----------+-----------------+ POP      Partial        Yes      Yes                  Age Indeterminate  +---------+---------------+---------+-----------+----------+-----------------+ PTV      Full                                                           +---------+---------------+---------+-----------+----------+-----------------+ PERO     Full                                                           +---------+---------------+---------+-----------+----------+-----------------+ Soleal   None           No       No                   Acute             +---------+---------------+---------+-----------+----------+-----------------+   +----+---------------+---------+-----------+----------+--------------+ LEFTCompressibilityPhasicitySpontaneityPropertiesThrombus Aging +----+---------------+---------+-----------+----------+--------------+ CFV Full                                                        +----+---------------+---------+-----------+----------+--------------+     Summary: RIGHT: - Findings consistent with acute deep vein thrombosis involving the right soleal veins. - Findings consistent with age indeterminate deep vein thrombosis involving the right popliteal vein. - Findings appear improved from previous examination. - No cystic structure found in the popliteal fossa. - Ultrasound characteristics of enlarged lymph nodes are noted in the groin.  LEFT: - No evidence of common femoral vein obstruction. - Ultrasound characteristics of enlarged lymph nodes noted in the groin.  *See table(s) above for measurements and observations. Electronically signed by Orlie Pollen on 02/16/2022 at 4:14:05 AM.    Final    IR IMAGING GUIDED PORT INSERTION  Result Date: 02/08/2022 INDICATION: LEFT lung cancer. EXAM: IMPLANTED PORT A CATH PLACEMENT WITH ULTRASOUND AND FLUOROSCOPIC GUIDANCE MEDICATIONS: None ANESTHESIA/SEDATION: Moderate (conscious) sedation was employed during this procedure. A total of Versed 1 mg and Fentanyl 100  mcg was administered intravenously. Moderate Sedation Time: 36  minutes. The patient's level of consciousness and vital signs were monitored continuously by radiology nursing throughout the procedure under my direct supervision. FLUOROSCOPY TIME:  Fluoroscopic dose; 1 mGy COMPLICATIONS: None immediate. PROCEDURE: The procedure, risks, benefits, and alternatives were explained to the patient. Questions regarding the procedure were encouraged and answered. The patient understands and consents to the procedure. The RIGHT neck and chest were prepped with chlorhexidine in a sterile fashion, and a sterile drape was applied covering the operative field. Maximum barrier sterile technique with sterile gowns and gloves were used for the procedure. A timeout was performed prior to the initiation of the procedure. Local anesthesia was provided with 1% lidocaine with epinephrine. After creating a small venotomy incision, a micropuncture kit was utilized to access the external jugular vein under direct, real-time ultrasound guidance. Ultrasound image documentation was performed. The microwire was kinked to measure appropriate catheter length. A subcutaneous port pocket was then created along the upper chest wall utilizing a combination of sharp and blunt dissection. The pocket was irrigated with sterile saline. A single lumen ISP power injectable port was chosen for placement. The 8 Fr catheter was tunneled from the port pocket site to the venotomy incision. The port was placed in the pocket. The external catheter was trimmed to appropriate length. At the venotomy, an 8 Fr peel-away sheath was placed over a guidewire under fluoroscopic guidance. The catheter was then placed through the sheath and the sheath was removed. Final catheter positioning was confirmed and documented with a fluoroscopic spot radiograph. The port was accessed with a Huber needle, aspirated and flushed with heparinized saline. The port pocket incision was closed with interrupted 3-0 Vicryl suture then Dermabond was  applied, including at the venotomy incision. Dressings were placed. The patient tolerated the procedure well without immediate post procedural complication. FINDINGS: Thrombosed RIGHT internal jugular vein. Patent RIGHT external jugular vein and EJ-subclavian confluence. IMPRESSION: Successful placement of a RIGHT external jugular approach power injectable Port-A-Cath. The tip of the catheter is positioned within the proximal RIGHT atrium. The catheter is ready for immediate use. Michaelle Birks, MD [facility] Electronically Signed   By: Michaelle Birks M.D.   On: 02/08/2022 17:08   DG CHEST PORT 1 VIEW  Result Date: 02/06/2022 CLINICAL DATA:  Increased breath sounds EXAM: PORTABLE CHEST 1 VIEW COMPARISON:  02/05/2022 FINDINGS: Cardiac shadow is enlarged but stable. Mediastinum is again prominent similar to that seen on the prior exam consistent with the known left upper lobe mass lesion. Persistent left-sided pleural effusion is noted stable in appearance from the prior exam. No pneumothorax is seen. Right lung remains clear. No acute bony abnormality is noted. IMPRESSION: Persistent left-sided pleural effusions stable from the previous day. Known left upper lobe mass lesion medially. Electronically Signed   By: Inez Catalina M.D.   On: 02/06/2022 19:36   DG Chest Port 1 View  Result Date: 02/05/2022 CLINICAL DATA:  Left thoracentesis EXAM: PORTABLE CHEST 1 VIEW COMPARISON:  Radiograph 02/04/2022 FINDINGS: Significantly decreased left pleural effusion, now small to moderate size after thoracentesis. No evidence of pneumothorax. Unchanged cardiomediastinal silhouette. Unchanged large left upper lobe mass. IMPRESSION: No evidence of pneumothorax after thoracentesis. Decreased size of the left pleural effusion. Electronically Signed   By: Maurine Simmering M.D.   On: 02/05/2022 14:05   US THORACENTESIS ASP PLEURAL SPACE W/IMG GUIDE  Result Date:  02/05/2022 INDICATION: Patient with history of metastatic lung  cancer with large left pleural effusion. For diagnostic and therapeutic left thoracentesis. EXAM: ULTRASOUND GUIDED DIAGNOSTIC AND THERAPEUTIC LEFT THORACENTESIS MEDICATIONS: 10 mL 1 % lidocaine COMPLICATIONS: None immediate. PROCEDURE: An ultrasound guided thoracentesis was thoroughly discussed with the patient and questions answered. The benefits, risks, alternatives and complications were also discussed. The patient understands and wishes to proceed with the procedure. Written consent was obtained by patient's wife. Ultrasound was performed to localize and mark an adequate pocket of fluid in the left chest. The area was then prepped and draped in the normal sterile fashion. 1% Lidocaine was used for local anesthesia. Under ultrasound guidance a 6 Fr Safe-T-Centesis catheter was introduced. Thoracentesis was performed. The catheter was removed and a dressing applied. FINDINGS: A total of approximately 1.1 L of clear, amber fluid was removed. Samples were sent to the laboratory as requested by the clinical team. IMPRESSION: Successful ultrasound guided left thoracentesis yielding 1.1 L of pleural fluid. Read by: Narda Rutherford, AGNP-BC Electronically Signed   By: Corrie Mckusick D.O.   On: 02/05/2022 14:01   CT Head Wo Contrast  Result Date: 02/04/2022 CLINICAL DATA:  Generalized weakness. EXAM: CT HEAD WITHOUT CONTRAST CT CERVICAL SPINE WITHOUT CONTRAST TECHNIQUE: Multidetector CT imaging of the head and cervical spine was performed following the standard protocol without intravenous contrast. Multiplanar CT image reconstructions of the cervical spine were also generated. RADIATION DOSE REDUCTION: This exam was performed according to the departmental dose-optimization program which includes automated exposure control, adjustment of the mA and/or kV according to patient size and/or use of iterative reconstruction technique. COMPARISON:  12/30/2021 CT  head, 01/24/2022 MRI head; no prior cervical spine CT, correlation is made with 04/17/2013 cervical spine radiographs FINDINGS: CT HEAD FINDINGS Evaluation is somewhat limited by motion artifact. Brain: No evidence of acute infarct, hemorrhage, mass, mass effect, or midline shift. No hydrocephalus or extra-axial fluid collection. Remote left occipital infarct. Redemonstrated lacunar infarcts in the left basal ganglia, left corona radiata, and bilateral thalami. Periventricular white matter changes, likely the sequela of chronic small vessel ischemic disease. Vascular: No hyperdense vessel. Skull: Normal. Negative for fracture or focal lesion. Sinuses/Orbits: Partial opacification of the posterior left ethmoid air cells and left sphenoid sinus. Status post bilateral lens replacements. Other: The mastoid air cells are well aerated. CT CERVICAL SPINE FINDINGS Alignment: Straightening of the normal cervical lordosis. No listhesis. Skull base and vertebrae: No acute fracture. No primary bone lesion or focal pathologic process. Soft tissues and spinal canal: No prevertebral fluid or swelling. No visible canal hematoma. Disc levels: Mild degenerative changes for age. No high-grade spinal canal stenosis. Upper chest: Left apical mass, which extends into the inferior left neck, which is better evaluated on the 01/15/2022 CT chest. Moderate right and large left pleural effusions, new on the right and increased on the left compared to 01/15/2022 Other: Presumed nodal metastasis in the right neck (series 4, image 40) is poorly evaluated in the absence of intravenous contrast. Additional prominent right level 2 lymph nodes (series 4, image 53) and left level 3 lymph node (series 4, image 80). IMPRESSION: 1.  No acute intracranial process. 2.  No acute fracture or traumatic listhesis in the cervical spine. 3. Known left apical mass is incompletely evaluated. Moderate right and large left pleural effusions. 4. Presumed nodal  metastases in the bilateral cervical lymph nodes. Electronically Signed   By: Merilyn Baba M.D.   On: 02/04/2022 16:19   CT Cervical Spine Wo Contrast  Result Date: 02/04/2022 CLINICAL DATA:  Generalized weakness. EXAM: CT HEAD WITHOUT CONTRAST CT CERVICAL SPINE WITHOUT CONTRAST TECHNIQUE: Multidetector CT imaging of the head and cervical spine was performed following the standard protocol without intravenous contrast. Multiplanar CT image reconstructions of the cervical spine were also generated. RADIATION DOSE REDUCTION: This exam was performed according to the departmental dose-optimization program which includes automated exposure control, adjustment of the mA and/or kV according to patient size and/or use of iterative reconstruction technique. COMPARISON:  12/30/2021 CT head, 01/24/2022 MRI head; no prior cervical spine CT, correlation is made with 04/17/2013 cervical spine radiographs FINDINGS: CT HEAD FINDINGS Evaluation is somewhat limited by motion artifact. Brain: No evidence of acute infarct, hemorrhage, mass, mass effect, or midline shift. No hydrocephalus or extra-axial fluid collection. Remote left occipital infarct. Redemonstrated lacunar infarcts in the left basal ganglia, left corona radiata, and bilateral thalami. Periventricular white matter changes, likely the sequela of chronic small vessel ischemic disease. Vascular: No hyperdense vessel. Skull: Normal. Negative for fracture or focal lesion. Sinuses/Orbits: Partial opacification of the posterior left ethmoid air cells and left sphenoid sinus. Status post bilateral lens replacements. Other: The mastoid air cells are well aerated. CT CERVICAL SPINE FINDINGS Alignment: Straightening of the normal cervical lordosis. No listhesis. Skull base and vertebrae: No acute fracture. No primary bone lesion or focal pathologic process. Soft tissues and spinal canal: No prevertebral fluid or swelling. No visible canal hematoma. Disc levels: Mild  degenerative changes for age. No high-grade spinal canal stenosis. Upper chest: Left apical mass, which extends into the inferior left neck, which is better evaluated on the 01/15/2022 CT chest. Moderate right and large left pleural effusions, new on the right and increased on the left compared to 01/15/2022 Other: Presumed nodal metastasis in the right neck (series 4, image 40) is poorly evaluated in the absence of intravenous contrast. Additional prominent right level 2 lymph nodes (series 4, image 53) and left level 3 lymph node (series 4, image 80). IMPRESSION: 1.  No acute intracranial process. 2.  No acute fracture or traumatic listhesis in the cervical spine. 3. Known left apical mass is incompletely evaluated. Moderate right and large left pleural effusions. 4. Presumed nodal metastases in the bilateral cervical lymph nodes. Electronically Signed   By: Merilyn Baba M.D.   On: 02/04/2022 16:19   DG Chest 1 View  Result Date: 02/04/2022 CLINICAL DATA:  Syncope EXAM: CHEST  1 VIEW COMPARISON:  Chest x-ray dated January 22, 2022 FINDINGS: Visualized cardiac and mediastinal contours are within normal limits. Left suprahilar mass compatible with known malignancy. New large right pleural effusion. Evidence of pneumothorax. IMPRESSION: Left suprahilar mass with new large left pleural effusion. Electronically Signed   By: Yetta Glassman M.D.   On: 02/04/2022 15:42   NM PET Image Initial (PI) Skull Base To Thigh (F-18 FDG)  Result Date: 01/27/2022 CLINICAL DATA:  Initial treatment strategy for non-small cell lung cancer. EXAM: NUCLEAR MEDICINE PET SKULL BASE TO THIGH TECHNIQUE: 7.9 mCi F-18 FDG was injected intravenously. Full-ring PET imaging was performed from the skull base to thigh after the radiotracer. CT data was obtained and used for attenuation correction and anatomic localization. Fasting blood glucose: 126 mg/dl COMPARISON:  Chest CT 01/15/2022 FINDINGS: Mediastinal blood pool activity: SUV max 2.4  Liver activity: SUV max NA NECK: Enlarged hypermetabolic RIGHT level II lymph node with SUV max equal 13.8 on image 24. Node measures 16 mm beneath sternocleidomastoid Hypermetabolic RIGHT supraglottic tissue adjacent to the epiglottis with SUV max equal 12.9 Incidental CT findings:  none CHEST: Large LEFT upper lobe mass bordering the mediastinum measuring 10.5 x 6.1 cm. The mass is intensely hypermetabolic peripherally SUV max equal 20.4 Evidence of direct extension into the AP window with 3.8 cm mass with SUV max equal 17.2 Large layering LEFT effusion. Within the layering effusion there is hypermetabolic lesion medially along the pleural surface with SUV max equal 18.4 on image 106 Incidental CT findings: none ABDOMEN/PELVIS: Bilateral hypermetabolic adrenal metastasis. The LEFT adrenal gland is enlarged to 3 cm with SUV max equal 53.6 Two hypermetabolic peritoneal implants. One in the RIGHT abdomen measures 2.2 cm (142) with SUV max equal 14.3. Smaller lesion in the ventral peritoneal space on the LEFT measures 10 mm on image 148 SUV max equal 12.5. No evidence of bowel obstruction. Incidental CT findings: none SKELETON: Single focus of metabolic activity in the LEFT inferior pubic ramus with SUV max equal 12.4 on image 90. No CT correlation Incidental CT findings: none IMPRESSION: 1. Large LEFT upper lobe mass with intense metabolic activity bordering the mediastinum and transverse arch of the aorta consistent with bronchogenic carcinoma 2. AP window hypermetabolic mass. Pleural metastasis in the LEFT lower lobe. 3. Hypermetabolic RIGHT level II metastatic cervical lymph nodes. 4. Hypermetabolic RIGHT supraglottic tissue. Unusual location for metastatic disease. Cannot exclude a primary lesion. 5. Bilateral hyper enlarged hypermetabolic adrenal metastasis. 6. Two  hypermetabolic peritoneal metastatic implants the abdomen 7. Single hypermetabolic skeletal metastasis to the LEFT inferior pubic ramus.  Electronically Signed   By: Suzy Bouchard M.D.   On: 01/27/2022 16:49   MR BRAIN W WO CONTRAST  Result Date: 01/25/2022 CLINICAL DATA:  Non-small cell lung cancer staging EXAM: MRI HEAD WITHOUT AND WITH CONTRAST TECHNIQUE: Multiplanar, multiecho pulse sequences of the brain and surrounding structures were obtained without and with intravenous contrast. CONTRAST:  5m GADAVIST GADOBUTROL 1 MMOL/ML IV SOLN COMPARISON:  01/16/2022 FINDINGS: Brain: No enhancement or edema to suggest metastatic disease. No recent infarction, hemorrhage, hydrocephalus, extra-axial collection or mass lesion. Advanced chronic small vessel ischemia with ischemic gliosis and chronic small vessel infarcts in the left basal ganglia, bilateral thalamus, and right pons. Small remote left occipital cortex infarct. Chronic microhemorrhages in the deep brain attributed to the same. Vascular: Major flow voids and vascular enhancements are preserved Skull and upper cervical spine: No focal marrow lesion. Sinuses/Orbits: Negative Other: Partially covered mass in the right lateral neck with heterogeneous/necrotic appearance and 3.4 cm size, likely nodal. IMPRESSION: 1. Heterogeneous mass in the right lateral neck, presumably nodal metastasis. 2. Negative for intracranial metastasis. 3. Advanced chronic small vessel disease. Electronically Signed   By: JJorje GuildM.D.   On: 01/25/2022 14:33    Microbiology: Results for orders placed or performed during the hospital encounter of 02/04/22  Susceptibility, Aer + Anaerob     Status: Abnormal   Collection Time: 02/04/22  1:34 PM  Result Value Ref Range Status   Suscept, Aer + Anaerob Final report (A)  Corrected    Comment: (NOTE) Performed At: [facility]BPima [id number] FarmerMD PQM:[id number] ON 08/20 AT 1035: PREVIOUSLY REPORTED AS Preliminary report    Source of Sample 8,680  Final    Comment: Performed at [place] [address], [place] [address] Susceptibility Result     Status: Abnormal   Collection Time: 02/04/22  1:34 PM  Result Value Ref Range Status   Suscept Result 1 Acinetobacter lwoffii (A)  Final  Comment: (NOTE) Identification performed by account, not confirmed by this laboratory.    Antimicrobial Suscept Comment  Corrected    Comment: (NOTE)      ** S = Susceptible; I = Intermediate; R = Resistant **                   P = Positive; N = Negative            MICS are expressed in micrograms per mL   Antibiotic                 RSLT#1    RSLT#2    RSLT#3    RSLT#4 Amikacin                       S Ampicillin/Sulbactam           S Cefepime                       S Cefotaxime                     R Ceftazidime                    R Ceftriaxone                    S Ciprofloxacin                  S Gentamicin                     S Levofloxacin                   S Meropenem                      S Piperacillin/Tazobactam        S Ticarcillin/Clavulanate        S Tobramycin                     S Trimethoprim/Sulfa             R Performed At: [facility] [employer] [place], [place] [id number] Rush Farmer MD [id number]   Culture, blood (routine x 2)     Status: Abnormal   Collection Time: 02/04/22  5:30 PM   Specimen: BLOOD  Result Value Ref Range Status   Specimen Description   Final    BLOOD LEFT ANTECUBITAL Performed at [place] [address], [place], [place] [address]    Special Requests   Final    BOTTLES DRAWN AEROBIC AND ANAEROBIC Blood Culture adequate volume Performed at [facility] [address] [place], [place] [address]    Culture  Setup Time   Final    GRAM NEGATIVE RODS IN BOTH AEROBIC AND ANAEROBIC BOTTLES CRITICAL RESULT CALLED TO, READ BACK BY AND VERIFIED WITH: PHARMD MARY ASHLYN ON 02/05/22 1723 BY DRT    Culture (A)  Final    ACINETOBACTER LWOFFII SEE SEPARATE  REPORT Performed at  [facility], [place] [address] [place], [place] [address]    Report Status 02/14/2022 FINAL  Final  Blood Culture ID Panel (Reflexed)     Status: None   Collection Time: 02/04/22  5:30 PM  Result Value Ref Range Status   Enterococcus faecalis NOT DETECTED NOT DETECTED Final   Enterococcus Faecium NOT  DETECTED NOT DETECTED Final   Listeria monocytogenes NOT DETECTED NOT DETECTED Final   Staphylococcus species NOT DETECTED NOT DETECTED Final   Staphylococcus aureus (BCID) NOT DETECTED NOT DETECTED Final   Staphylococcus epidermidis NOT DETECTED NOT DETECTED Final   Staphylococcus lugdunensis NOT DETECTED NOT DETECTED Final   Streptococcus species NOT DETECTED NOT DETECTED Final   Streptococcus agalactiae NOT DETECTED NOT DETECTED Final   Streptococcus pneumoniae NOT DETECTED NOT DETECTED Final   Streptococcus pyogenes NOT DETECTED NOT DETECTED Final   A.calcoaceticus-baumannii NOT DETECTED NOT DETECTED Final   Bacteroides fragilis NOT DETECTED NOT DETECTED Final   Enterobacterales NOT DETECTED NOT DETECTED Final   Enterobacter cloacae complex NOT DETECTED NOT DETECTED Final   Escherichia coli NOT DETECTED NOT DETECTED Final   Klebsiella aerogenes NOT DETECTED NOT DETECTED Final   Klebsiella oxytoca NOT DETECTED NOT DETECTED Final   Klebsiella pneumoniae NOT DETECTED NOT DETECTED Final   Proteus species NOT DETECTED NOT DETECTED Final   Salmonella species NOT DETECTED NOT DETECTED Final   Serratia marcescens NOT DETECTED NOT DETECTED Final   Haemophilus influenzae NOT DETECTED NOT DETECTED Final   Neisseria meningitidis NOT DETECTED NOT DETECTED Final   Pseudomonas aeruginosa NOT DETECTED NOT DETECTED Final   Stenotrophomonas maltophilia NOT DETECTED NOT DETECTED Final   Candida albicans NOT DETECTED NOT DETECTED Final   Candida auris NOT DETECTED NOT DETECTED Final   Candida glabrata NOT DETECTED NOT DETECTED Final   Candida krusei NOT DETECTED NOT DETECTED Final   Candida  parapsilosis NOT DETECTED NOT DETECTED Final   Candida tropicalis NOT DETECTED NOT DETECTED Final   Cryptococcus neoformans/gattii NOT DETECTED NOT DETECTED Final    Comment: Performed at [facility], [address], [place], [place] [address]  Culture, blood (routine x 2)     Status: None   Collection Time: 02/04/22  5:58 PM   Specimen: BLOOD  Result Value Ref Range Status   Specimen Description   Final    BLOOD BLOOD LEFT ARM Performed at [place] [address], [place], [place] [address]    Special Requests   Final    BOTTLES DRAWN AEROBIC AND ANAEROBIC Blood Culture adequate volume Performed at [facility] [address] [place], [place] [address]    Culture   Final    NO GROWTH 5 DAYS Performed at [facility], [place] [address] [place], [place] [address]    Report Status 02/09/2022 FINAL  Final  Culture, body fluid w Gram Stain-bottle     Status: None   Collection Time: 02/05/22  1:46 PM   Specimen: Fluid  Result Value Ref Range Status   Specimen Description FLUID PLEURAL  Final   Special Requests BOTTLES DRAWN AEROBIC AND ANAEROBIC  Final   Culture   Final    NO GROWTH 5 DAYS Performed at [facility], [place] [address], [place], [place] [address]    Report Status 02/10/2022 FINAL  Final  Gram stain     Status: None   Collection Time: 02/05/22  1:46 PM   Specimen: BLOOD  Result Value Ref Range Status   Specimen Description BLOOD PLEURAL  Final   Special Requests NONE  Final   Gram Stain   Final    CYTOSPIN SMEAR NO ORGANISMS SEEN NO WBC SEEN Performed at [facility], [place] [address] [place], [place] [address]    Report Status 02/05/2022 FINAL  Final  Culture, blood (Routine X 2)  w Reflex to ID Panel     Status: None   Collection Time: 02/06/22  1:41 PM   Specimen: BLOOD  Result Value Ref Range Status   Specimen Description   Final    BLOOD RIGHT ANTECUBITAL Performed at [facility] [address], [place], [place] [address]    Special Requests   Final    BOTTLES DRAWN AEROBIC AND ANAEROBIC Blood Culture adequate volume Performed at [facility] [address], [place], [place] [address]    Culture   Final    NO GROWTH 5 DAYS Performed at [facility], [place] [address], [place], [place] [address]    Report Status 02/11/2022 FINAL  Final  Culture, blood (Routine X 2) w Reflex to ID Panel     Status: None   Collection Time: 02/06/22  1:52 PM   Specimen: BLOOD  Result Value Ref Range Status   Specimen Description   Final    BLOOD BLOOD LEFT HAND Performed at [place] [address] [place], [place] [address]    Special Requests   Final    BOTTLES DRAWN AEROBIC AND ANAEROBIC Blood Culture adequate volume Performed at [place] [address] [place], [place] [address]    Culture   Final    NO GROWTH 5 DAYS Performed at [facility], [place] [address], [place], [place] [address]    Report Status 02/11/2022 FINAL  Final    Labs: CBC: Recent Labs  Lab 02/14/22 0516 02/14/22 1842 02/15/22 0359 02/16/22 0321 02/17/22 0350  WBC 0.5* 0.6* 0.7* 0.7* 2.9*  NEUTROABS 0.1* 0.1* 0.1* 0.1* 1.5*  HGB 10.9* 10.5* 11.4* 10.2* 10.2*  HCT 32.1* 30.8* 33.1* 29.2* 30.2*  MCV 90.7 90.1 90.4 89.0 89.6  PLT 9* 14* 7* 28* 22*   Basic Metabolic Panel: Recent Labs  Lab 02/13/22 0532 02/14/22 0516 02/16/22 0321 02/17/22 0350  NA 137 138 138 141  K 3.0* 3.7 3.5 3.6  CL 104 107 107 110  CO2 _0 GLUCOSE 94 90 86 119*  BUN _1 CREATININE 0.84 0.71 0.98 1.26*  CALCIUM 8.4* 8.5* 8.7* 8.7*  MG  --  1.6* 1.5* 2.4   Liver Function Tests: No results for input(s): "AST", "ALT", "ALKPHOS", "BILITOT", "PROT", "ALBUMIN" in the last 168 hours. CBG: Recent Labs  Lab 02/16/22 2025 02/17/22 0729 02/17/22 1137 02/17/22 1645 02/17/22 2040  GLUCAP 162* 109* 144* 107*  120*    Discharge time spent: approximately 35 minutes spent on discharge counseling, evaluation of patient on day of discharge, and coordination of discharge planning with nursing, social work, pharmacy and case management  Signed: Edwin Dada, MD [facility] 02/19/2022

## 2022-02-18 NOTE — Telephone Encounter (Signed)
I was able to locate the FMLA paperwork and placed them on providers desk.

## 2022-02-18 NOTE — TOC Progression Note (Addendum)
Transition of Care [facility]) - Progression Note    Patient Details  Name: Drew Harrington MRN: [id number] Date of Birth: 11-Jun-1946  Transition of Care [facility]) CM/SW [place], RN Phone Number: 02/18/2022, 11:18 AM  Clinical Narrative:   Spoke with patient's wife [name] to offer home hospice choice, wife requested a call back in 15 mins.   TOC will continue to follow.  - 12:05p Spoke with patient's wife [name], offered choice for home hospice services. [name] will contact her son to get assistance with home hospice selection. This RNCM awaiting call from [place] (wife).  TOC will continue to follow.   12:50p Spoke with patient's wife who selected a home hospice with [facility]. This RNCM spoke with referral department to refer patient to for home hospice. Will fax [phone] information to [facility].   TOC will continue to follow.  - 2:03p Patient's wife request this RNCM to coordinate home hospice with son [name] [phone]. Received POA from pt's son Kyce Ging, placed copy in chart. Spoke with patient's son [name] who selected [facility]. Home hospice made with [facility] with [facility]. Patient's son states patient has a recliner at home and can be discharged does not need to wait on DME delivery tomorrow. This RNCM contacted Jermaine with [employer] to advise of family's request to pick up  DME. Awaiting a response.  TOC will continue to follow.   Expected Discharge Plan: Home/Self Care Barriers to Discharge: No Barriers Identified  Expected Discharge Plan and Services Expected Discharge Plan: Home/Self Care In-house Referral: NA Discharge Planning Services: CM Consult Post Acute Care Choice: Durable Medical Equipment, Home Health Living arrangements for the past 2 months: [place]                 DME Arranged: Bedside commode, Hospital bed, Overbed table, Shower stool DME Agency: [employer] Date DME Agency Contacted: 02/09/22   Representative spoke  with at DME Agency: [facility] HH Arranged: PT, Nurse's Aide, OT, Social Work [employer] Agency: [facility] Date [place] 1: 02/08/22   Representative spoke with at [place]: [place] ([place]) Interventions    Readmission Risk Interventions    02/11/2022    2:01 PM 02/10/2022   10:02 AM  Readmission Risk Prevention Plan  Transportation Screening Complete   PCP or Specialist Appt within 3-5 Days Complete Complete  HRI or [place] Complete Complete  Social Work Consult for [place] Planning/Counseling Complete Complete  Palliative Care Screening  Not Applicable  Medication Review Press photographer) Complete Complete

## 2022-02-18 NOTE — Progress Notes (Signed)
NUTRITION NOTE  Patient last seen by this RD on 8/18. Chart and notes reviewed. Palliative Care note from today reviewed. Plan is for home with hospice today or tomorrow (8/25).   RD will sign off at this time. If nutrition-related needs arise prior to d/c, please re-consult.     Jarome Matin, MS, RD, LDN, [place] Registered Dietitian II Inpatient Clinical Nutrition RD pager # and on-call/weekend pager # available in [facility]

## 2022-02-18 NOTE — Progress Notes (Signed)
Daily Progress Note   Patient Name: Drew Harrington       Date: 02/18/2022 DOB: September 02, 1945  Age: 76 y.o. MRN#: [id number] Attending Physician: Alberteen Sam, * Primary Care Physician: Myrlene Broker, MD Admit Date: 02/04/2022 Length of Stay: 14 days  Reason for Consultation/Follow-up: Establishing goals of care  HPI/Patient Profile:  76 y.o. male  with past medical history of stage IV non-small cell lung cancer, adenocarcinoma with large apical LUL mass, PE involving large right main on Lovenox (diagnosed July 2023), hypertension, dementia, and CKD stage 3a.he presented to [facility] ED on 02/04/2022 with weakness and diarrhea.  Patient just had first session of chemotherapy on 8/9 and then started to have diarrhea.  He slipped in the bathtub while trying to wash up and subsequently was brought to the ED.  In the ED, he was febrile, tachycardic, and tachypneic.  Creatinine elevated.  CT head and cervical spine negative for injuries.  Imaging showed a large left pleural effusion.  Admitted to [facility] service.  Palliative Medicine consulted for goals of care.   He underwent thoracentesis earlier today 8/11.  Subjective:   Subjective: Chart Reviewed. Updates received. Patient Assessed. Created space and opportunity for patient  and family to explore thoughts and feelings regarding current medical situation.  Today's Discussion: I met with the patient at the bedside along with Dr. Maryfrances Bunnell from [facility]. He notes ongoing aggrevating cough despite Robitussin and Mucinex. Dr. Maryfrances Bunnell agreed to order another cough medicine.   We had a clear and frank discussion about options moving forward. The patient is clear he no longer wants chemotherapy because "I don't think it'll help in the end."   We discussed hospice as an option. I described hospice as a service for patients who have a life expectancy of 6 months or less.   The goal of hospice is the preservation of dignity and quality at  the end phases of life.   Under hospice care, the focus changes from curative to symptom relief.   His wife and her friend joined the conversation. We recapped what was already discussed. The patient's wife is in agreement with home hospice services. We discussed the timeframe of d/c as dependent on hospice being able to arrange equipment delivery but hopefull for possible d/c today.  I provided emotional and general support through therapeutic listening, empathy, sharing of stories, and other techniques. I answered all questions and addressed all concerns to the best of my ability.  Review of Systems  Constitutional:  Positive for fatigue.  Gastrointestinal:  Negative for abdominal pain, nausea and vomiting.    Objective:   Vital Signs:  BP 134/83   Pulse (!) 101   Temp 98.4 F (36.9 C)   Resp 15   Ht 5\' 9"  (1.753 m)   Wt 75.8 kg   SpO2 94%   BMI 24.68 kg/m   Physical Exam: Physical Exam Vitals and nursing note reviewed.  Constitutional:      Appearance: He is ill-appearing. He is not toxic-appearing.  HENT:     Head: Normocephalic and atraumatic.  Cardiovascular:     Rate and Rhythm: Normal rate.  Pulmonary:     Effort: Pulmonary effort is normal. No respiratory distress.  Abdominal:     General: Abdomen is flat. Bowel sounds are normal.     Palpations: Abdomen is soft.  Skin:    General: Skin is warm and dry.  Neurological:     Mental Status: He is alert.  Psychiatric:  Mood and Affect: Mood normal.        Behavior: Behavior normal.     Palliative Assessment/Data: 40%   Assessment & Plan:   Impression: Present on Admission:  HTN (hypertension)  Adenocarcinoma of left lung, stage 4 (HCC)  Stage 3a chronic kidney disease (CKD) ([place])  SUMMARY OF RECOMMENDATIONS   Continue DNR Plan for d/c to home hospice (TOC aware via secure chat) Anticipate d/c today or tomorrow PMT will follow peripherally Please contact us if further needs  Symptom  Management:  Per primary team PMT is available to assist as needed  Code Status: DNR  Prognosis: < 6 months  Discharge Planning: Home with Hospice  Discussed with: Patient, medical team, nursing team  Thank you for allowing [place] to participate in the care of [name] PMT will continue to support holistically.  Billing based on MDM: High  Problems Addressed: One acute or chronic illness or injury that poses a threat to life or bodily function  Amount and/or Complexity of Data: Category 3:Discussion of management or test interpretation with external physician/other qualified health care professional/appropriate source (not separately reported)  Risks: Decision not to resuscitate or to de-escalate care because of poor prognosis   Walden Field, NP Palliative Medicine Team  Team Phone # [phone] (Nights/Weekends)  02/24/2021, 8:17 AM

## 2022-02-19 DIAGNOSIS — A419 Sepsis, unspecified organism: Secondary | ICD-10-CM | POA: Diagnosis not present

## 2022-02-19 DIAGNOSIS — R6520 Severe sepsis without septic shock: Secondary | ICD-10-CM | POA: Diagnosis not present

## 2022-02-19 MED ORDER — HEPARIN SOD (PORK) LOCK FLUSH 100 UNIT/ML IV SOLN
500.0000 [IU] | INTRAVENOUS | Status: AC | PRN
  Administered 2022-02-19: 500 [IU]
  Filled 2022-02-19: qty 5

## 2022-02-19 NOTE — Plan of Care (Signed)
  Problem: Coping: Goal: Level of anxiety will decrease Outcome: Progressing   Problem: Safety: Goal: Ability to remain free from injury will improve Outcome: Progressing   Problem: Respiratory: Goal: Ability to maintain adequate ventilation will improve Outcome: Progressing   Problem: Nutrition: Goal: Adequate nutrition will be maintained Outcome: Not Progressing

## 2022-02-19 NOTE — Plan of Care (Signed)
  Problem: Education: Goal: Knowledge of General Education information will improve Description: Including pain rating scale, medication(s)/side effects and non-pharmacologic comfort measures Outcome: Adequate for Discharge   Problem: Health Behavior/Discharge Planning: Goal: Ability to manage health-related needs will improve Outcome: Adequate for Discharge   Problem: Clinical Measurements: Goal: Ability to maintain clinical measurements within normal limits will improve Outcome: Adequate for Discharge Goal: Will remain free from infection Outcome: Adequate for Discharge Goal: Diagnostic test results will improve Outcome: Adequate for Discharge Goal: Respiratory complications will improve Outcome: Adequate for Discharge Goal: Cardiovascular complication will be avoided Outcome: Adequate for Discharge   Problem: Activity: Goal: Risk for activity intolerance will decrease Outcome: Adequate for Discharge   Problem: Nutrition: Goal: Adequate nutrition will be maintained 02/19/2022 1606 by Lennie Hummer, RN Outcome: Adequate for Discharge 02/19/2022 1128 by Lennie Hummer, RN Outcome: Not Progressing   Problem: Coping: Goal: Level of anxiety will decrease 02/19/2022 1606 by Lennie Hummer, RN Outcome: Adequate for Discharge 02/19/2022 1128 by Lennie Hummer, RN Outcome: Progressing   Problem: Elimination: Goal: Will not experience complications related to bowel motility Outcome: Adequate for Discharge Goal: Will not experience complications related to urinary retention Outcome: Adequate for Discharge   Problem: Pain Managment: Goal: General experience of comfort will improve Outcome: Adequate for Discharge   Problem: Safety: Goal: Ability to remain free from injury will improve 02/19/2022 1606 by Lennie Hummer, RN Outcome: Adequate for Discharge 02/19/2022 1128 by Lennie Hummer, RN Outcome: Progressing   Problem: Skin Integrity: Goal: Risk for impaired skin  integrity will decrease Outcome: Adequate for Discharge   Problem: Activity: Goal: Ability to tolerate increased activity will improve Outcome: Adequate for Discharge   Problem: Clinical Measurements: Goal: Ability to maintain a body temperature in the normal range will improve Outcome: Adequate for Discharge   Problem: Respiratory: Goal: Ability to maintain adequate ventilation will improve 02/19/2022 1606 by Lennie Hummer, RN Outcome: Adequate for Discharge 02/19/2022 1128 by Lennie Hummer, RN Outcome: Progressing Goal: Ability to maintain a clear airway will improve Outcome: Adequate for Discharge   Problem: Education: Goal: Ability to describe self-care measures that may prevent or decrease complications (Diabetes Survival Skills Education) will improve Outcome: Adequate for Discharge Goal: Individualized Educational Video(s) Outcome: Adequate for Discharge   Problem: Coping: Goal: Ability to adjust to condition or change in health will improve Outcome: Adequate for Discharge   Problem: Fluid Volume: Goal: Ability to maintain a balanced intake and output will improve Outcome: Adequate for Discharge   Problem: Health Behavior/Discharge Planning: Goal: Ability to identify and utilize available resources and services will improve Outcome: Adequate for Discharge Goal: Ability to manage health-related needs will improve Outcome: Adequate for Discharge   Problem: Metabolic: Goal: Ability to maintain appropriate glucose levels will improve Outcome: Adequate for Discharge   Problem: Nutritional: Goal: Maintenance of adequate nutrition will improve Outcome: Adequate for Discharge Goal: Progress toward achieving an optimal weight will improve Outcome: Adequate for Discharge   Problem: Skin Integrity: Goal: Risk for impaired skin integrity will decrease Outcome: Adequate for Discharge   Problem: Tissue Perfusion: Goal: Adequacy of tissue perfusion will  improve Outcome: Adequate for Discharge   Problem: Fluid Volume: Goal: Hemodynamic stability will improve Outcome: Adequate for Discharge   Problem: Clinical Measurements: Goal: Diagnostic test results will improve Outcome: Adequate for Discharge Goal: Signs and symptoms of infection will decrease Outcome: Adequate for Discharge   Problem: Respiratory: Goal: Ability to maintain adequate ventilation will improve Outcome: Adequate for Discharge

## 2022-02-19 NOTE — Progress Notes (Signed)
  Progress Note   Patient: Drew Harrington HRC:[id number] DOB: 09/02/1945 DOA: 02/04/2022     15 DOS: the patient was seen and examined on 02/18/2022        Brief hospital course: Drew Harrington is a 76 y.o. 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     Assessment and Plan: * Severe sepsis ([place]) Septicemia due to Acinetobacter species ([place]) Adenocarcinoma of left lung, stage 4 (HCC) CAP (community acquired pneumonia) Pleural effusion, left Treated with antibiotics, resolved.  Now stable off antibiotics.   Pancytopenia ([place]) We have stopped Granix as the patient's Walker rose to a suitable level, and simultaneously the decision was made to pivot to comfort.  Chronic respiratory failure with hypoxia (HCC) Patient intermittently on O2.  Pressure injury of skin To sacrum stage 2, not present on admission    History of pulmonary embolus (PE) Lovenox stopped.  DM2 (diabetes mellitus, type 2) ([place]) Controlled here - Continue SS corrections  HTN (hypertension) - Continue BP meds          Subjective: Patient without complaints, appetite okay.  Tired.  No respiratory distress, no new confusion, no fever.     Physical Exam: Vitals:   02/18/22 2017 02/19/22 0302 02/19/22 0948 02/19/22 1241  BP: (!) 146/89 (!) 148/85 132/77 132/71  Pulse: 91 88 (!) 105 98  Resp: 18 20  18   Temp: 98.5 F (36.9 C) 99 F (37.2 C)  98 F (36.7 C)  TempSrc: Oral Oral  Oral  SpO2: 100% 97%  92%  Weight:      Height:       Thin adult male, lying in bed RRR no murmurs, no LE edema Posterior back and buttocks and legs wtihout ulcer No abdominal tenderness to palpation, no rigidity no guarding Attention diminished, affect blunted, oriented to self, interactive and responds to questions, somewhat  distracted, speech fluent, severe generalized weakness  Data Reviewed: No new labs  Family Communication: Wife at bedside           Author: Edwin Dada, MD 02/18/2022 4:17 PM  For on call review [email or web address].

## 2022-02-19 NOTE — Plan of Care (Signed)
  Problem: Coping: Goal: Level of anxiety will decrease Outcome: Progressing   Problem: Pain Managment: Goal: General experience of comfort will improve Outcome: Progressing   Problem: Safety: Goal: Ability to remain free from injury will improve Outcome: Progressing   Problem: Skin Integrity: Goal: Risk for impaired skin integrity will decrease Outcome: Progressing   Problem: Respiratory: Goal: Ability to maintain a clear airway will improve Outcome: Progressing

## 2022-02-23 ENCOUNTER — Ambulatory Visit: Payer: Medicare Other

## 2022-02-23 ENCOUNTER — Other Ambulatory Visit: Payer: Medicare Other

## 2022-02-23 ENCOUNTER — Ambulatory Visit: Payer: Medicare Other | Admitting: Internal Medicine

## 2022-03-03 ENCOUNTER — Other Ambulatory Visit: Payer: Medicare Other

## 2022-03-04 ENCOUNTER — Encounter (HOSPITAL_COMMUNITY): Payer: Self-pay

## 2022-03-10 ENCOUNTER — Other Ambulatory Visit: Payer: Medicare Other

## 2022-03-17 ENCOUNTER — Ambulatory Visit: Payer: Medicare Other

## 2022-03-17 ENCOUNTER — Encounter: Payer: Medicare Other | Admitting: Dietician

## 2022-03-17 ENCOUNTER — Ambulatory Visit: Payer: Medicare Other | Admitting: Physician Assistant

## 2022-03-17 ENCOUNTER — Other Ambulatory Visit: Payer: Medicare Other

## 2022-03-24 ENCOUNTER — Other Ambulatory Visit: Payer: Medicare Other

## 2022-03-31 ENCOUNTER — Other Ambulatory Visit: Payer: Medicare Other

## 2022-04-02 ENCOUNTER — Encounter: Payer: Self-pay | Admitting: Internal Medicine

## 2022-04-07 ENCOUNTER — Ambulatory Visit: Payer: Medicare Other

## 2022-04-07 ENCOUNTER — Other Ambulatory Visit: Payer: Medicare Other

## 2022-04-07 ENCOUNTER — Ambulatory Visit: Payer: Medicare Other | Admitting: Internal Medicine

## 2022-04-09 ENCOUNTER — Telehealth: Payer: Self-pay | Admitting: Medical Oncology

## 2022-04-09 NOTE — Telephone Encounter (Signed)
Returned wife's call. She wants to know how is his cancer . She stated pt is doing well, eating well, walking around and overall doing good. " I wonder if he still needs hospice"?  I LVM for [facility]) to return my call to get pt update.

## 2022-04-14 ENCOUNTER — Other Ambulatory Visit: Payer: Medicare Other

## 2022-04-21 ENCOUNTER — Other Ambulatory Visit: Payer: Medicare Other

## 2022-04-28 ENCOUNTER — Other Ambulatory Visit: Payer: Medicare Other

## 2022-04-28 ENCOUNTER — Ambulatory Visit: Payer: Medicare Other | Admitting: Physician Assistant

## 2022-04-28 ENCOUNTER — Ambulatory Visit: Payer: Medicare Other

## 2022-05-10 ENCOUNTER — Telehealth: Payer: Self-pay | Admitting: Internal Medicine

## 2022-05-10 NOTE — Telephone Encounter (Signed)
Spouse, [name], called to ask provider if she should cancel the appointment 05/13/22. Per spouse pt has stage 4 cancer and is in Hospice palliative care. Please advise if there is anything more PCP needs from wife, [place]. [place] [phone]

## 2022-05-12 NOTE — Telephone Encounter (Signed)
Ok to cancel, if there is anything we can do to help let us know.

## 2022-05-12 NOTE — Telephone Encounter (Signed)
Lvm for pts wife and was able to inform her that the pts appointment has been CX and if it is anything she may need please to contact and let us know.

## 2022-05-12 NOTE — Telephone Encounter (Signed)
Pt has stated he would like to have imaging ordered to see if the cancer has spread or shrunk any. He states he would like a CT, MRI or something done just so he knows what is going on please.

## 2022-05-13 ENCOUNTER — Ambulatory Visit: Payer: Medicare Other | Admitting: Internal Medicine

## 2022-05-13 NOTE — Telephone Encounter (Signed)
Generally we do not do this based on his type of cancer it is likely to continue spreading.

## 2022-05-17 NOTE — Telephone Encounter (Signed)
I was able to speak with the Drew Harrington and inform him of Dr. Nathanial Millman advice. Drew Harrington states he understood and He still loves Dr. [name] and that she is still his Primary Doctor.

## 2022-12-27 DEATH — deceased
